# Patient Record
Sex: Female | Born: 1955
Health system: Southern US, Community
[De-identification: ages and names within clinical notes are randomized; demographics above are authoritative.]

## PROBLEM LIST (undated history)

## (undated) DIAGNOSIS — K219 Gastro-esophageal reflux disease without esophagitis: Secondary | ICD-10-CM

## (undated) DIAGNOSIS — Z973 Presence of spectacles and contact lenses: Secondary | ICD-10-CM

## (undated) DIAGNOSIS — Z9289 Personal history of other medical treatment: Secondary | ICD-10-CM

## (undated) DIAGNOSIS — N189 Chronic kidney disease, unspecified: Secondary | ICD-10-CM

## (undated) DIAGNOSIS — N186 End stage renal disease: Secondary | ICD-10-CM

## (undated) DIAGNOSIS — I2489 Other forms of acute ischemic heart disease: Secondary | ICD-10-CM

## (undated) DIAGNOSIS — I248 Other forms of acute ischemic heart disease: Secondary | ICD-10-CM

## (undated) DIAGNOSIS — M069 Rheumatoid arthritis, unspecified: Secondary | ICD-10-CM

## (undated) DIAGNOSIS — I712 Thoracic aortic aneurysm, without rupture: Secondary | ICD-10-CM

## (undated) DIAGNOSIS — J302 Other seasonal allergic rhinitis: Secondary | ICD-10-CM

## (undated) DIAGNOSIS — I10 Essential (primary) hypertension: Secondary | ICD-10-CM

## (undated) DIAGNOSIS — Z9889 Other specified postprocedural states: Secondary | ICD-10-CM

## (undated) DIAGNOSIS — M6281 Muscle weakness (generalized): Secondary | ICD-10-CM

## (undated) DIAGNOSIS — I471 Supraventricular tachycardia: Secondary | ICD-10-CM

## (undated) DIAGNOSIS — D649 Anemia, unspecified: Secondary | ICD-10-CM

## (undated) DIAGNOSIS — I4719 Other supraventricular tachycardia: Secondary | ICD-10-CM

## (undated) DIAGNOSIS — M8580 Other specified disorders of bone density and structure, unspecified site: Secondary | ICD-10-CM

## (undated) DIAGNOSIS — K5900 Constipation, unspecified: Secondary | ICD-10-CM

## (undated) DIAGNOSIS — R112 Nausea with vomiting, unspecified: Secondary | ICD-10-CM

## (undated) DIAGNOSIS — Z8489 Family history of other specified conditions: Secondary | ICD-10-CM

## (undated) HISTORY — DX: Anemia, unspecified: D64.9

## (undated) HISTORY — DX: Essential (primary) hypertension: I10

## (undated) HISTORY — DX: Muscle weakness (generalized): M62.81

## (undated) HISTORY — PX: TONSILLECTOMY: SUR1361

## (undated) HISTORY — PX: COLONOSCOPY: SHX174

---

## 1998-01-04 HISTORY — PX: TUMOR EXCISION: SHX421

## 2012-05-21 ENCOUNTER — Ambulatory Visit (INDEPENDENT_AMBULATORY_CARE_PROVIDER_SITE_OTHER): Payer: BC Managed Care – PPO | Admitting: Family Medicine

## 2012-05-21 ENCOUNTER — Encounter: Payer: Self-pay | Admitting: Family Medicine

## 2012-05-21 VITALS — BP 160/90 | HR 68 | Temp 97.9°F | Resp 16 | Ht 64.5 in | Wt 132.0 lb

## 2012-05-21 DIAGNOSIS — T148XXA Other injury of unspecified body region, initial encounter: Secondary | ICD-10-CM

## 2012-05-21 DIAGNOSIS — M79609 Pain in unspecified limb: Secondary | ICD-10-CM

## 2012-05-21 DIAGNOSIS — Z23 Encounter for immunization: Secondary | ICD-10-CM

## 2012-05-21 NOTE — Progress Notes (Signed)
Verbal consent obtained from the patient.  Local anesthesia with 2cc Lidocaine 2% without epinephrine.  Wound scrubbed with soap and water and rinsed.  Wound closed with #6 5-0 Prolene simple interrupted sutures.  Wound cleansed and dressed.

## 2012-05-21 NOTE — Patient Instructions (Addendum)

## 2012-05-23 NOTE — Progress Notes (Signed)
Subjective:    Patient ID: Erin Morgan, female    DOB: Jul 20, 1955, 57 y.o.   MRN: [id number] Chief Complaint  Patient presents with  . Arm Injury    right arm puncture x today    HPI  Attempted to move a small personal (college-dorm sized) fridge in her garage today and scraped her inner right forearm along the metal coils on the back of the fridge.  Immed started bleeding copiously and so applied pressure but just didn't look right - skin gaping and can see muscle underneath.  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) so knows that all immunizations are UTD but doesn't remember when last tetanus was - likely >5 yrs prev.  No past medical history on file. No current outpatient prescriptions on file prior to visit.   No current facility-administered medications on file prior to visit.   No Known Allergies   Review of Systems  Constitutional: Negative for fever, chills, diaphoresis and activity change.  Musculoskeletal: Positive for myalgias. Negative for joint swelling, arthralgias and gait problem.  Skin: Positive for wound. Negative for color change, pallor and rash.  Hematological: Negative for adenopathy. Does not bruise/bleed easily.  Psychiatric/Behavioral: The patient is not nervous/anxious.        BP 160/90  Pulse 68  Temp(Src) 97.9 F (36.6 C) (Oral)  Resp 16  Ht 5' 4.5" (1.638 m)  Wt 132 lb (59.875 kg)  BMI 22.32 kg/m2  SpO2 95% Objective:   Physical Exam  Constitutional: She is oriented to person, place, and time. She appears well-developed and well-nourished. No distress.  HENT:  Head: Normocephalic and atraumatic.  Right Ear: External ear normal.  Eyes: Conjunctivae are normal. No scleral icterus.  Pulmonary/Chest: Effort normal.  Musculoskeletal:       Right elbow: Normal.      Right forearm: She exhibits tenderness and laceration. She exhibits no bony tenderness, no swelling, no edema and no deformity.       Arms:      Right hand: She exhibits  normal range of motion, no tenderness, no bony tenderness, normal two-point discrimination, normal capillary refill and no swelling. Normal sensation noted. Normal strength noted. She exhibits no finger abduction, no thumb/finger opposition and no wrist extension trouble.  Neurological: She is alert and oriented to person, place, and time. She has normal strength. She displays no atrophy and no tremor. No sensory deficit. She exhibits normal muscle tone.  Skin: Skin is warm and dry. Abrasion and laceration noted. No bruising, no ecchymosis and no rash noted. She is not diaphoretic. No erythema.  Distal left inner forearm with 3 superficial linear abrasions running parallel to forearm - most medial with laceration and small avulsion of full thickness skin approx 2 cm length with 1/2 cm laceration perpendicular to middle of larger lac length into T-shape. No underlying muscle damage. Hemostatic  Psychiatric: She has a normal mood and affect. Her behavior is normal.      Assessment & Plan:  Puncture wound - Plan: Tdap vaccine greater than or equal to 7yo IM S/p suture repair by Dellis Filbert, PA. Wound care and f/u per her instructions.  RTC immed for any increased pain, redness, swelling, or purulent drainage.  Meds ordered this encounter  Medications  . VITAMIN D, CHOLECALCIFEROL, PO    Sig: Take by mouth.  Marland Kitchen alendronate (FOSAMAX) 35 MG tablet    Sig: Take 35 mg by mouth every 7 (seven) days. Take with a full glass of water on an  empty stomach.  . estradiol (VAGIFEM) 25 MCG vaginal tablet    Sig: Place 25 mcg vaginally daily.  . Biotin 10 MG CAPS    Sig: Take by mouth.

## 2012-05-31 ENCOUNTER — Ambulatory Visit (INDEPENDENT_AMBULATORY_CARE_PROVIDER_SITE_OTHER): Payer: BC Managed Care – PPO | Admitting: Physician Assistant

## 2012-05-31 VITALS — BP 118/80 | HR 86 | Temp 98.5°F | Resp 16

## 2012-05-31 DIAGNOSIS — Z4802 Encounter for removal of sutures: Secondary | ICD-10-CM

## 2012-05-31 NOTE — Progress Notes (Signed)
  Subjective:    Patient ID: Erin Morgan, female    DOB: 03/19/1955, 57 y.o.   MRN: [id number]  HPI   Erin Morgan is a 57 yr old female here for suture removal.  Sutures were placed here 10 days ago.  Pt states she is doing well.  Minimal discomfort at this point.  No redness, swelling, or drainage from the area.      Review of Systems  Skin: Positive for wound (healing, right forearm).  All other systems reviewed and are negative.       Objective:   Physical Exam  Vitals reviewed. Constitutional: She is oriented to person, place, and time. She appears well-developed and well-nourished. No distress.  HENT:  Head: Normocephalic and atraumatic.  Eyes: Conjunctivae are normal. No scleral icterus.  Pulmonary/Chest: Effort normal.  Neurological: She is alert and oriented to person, place, and time.  Skin: Skin is warm and dry.     Healed laceration of right forearm; sutures in place  Psychiatric: She has a normal mood and affect. Her behavior is normal.          Assessment & Plan:  Visit for suture removal   Erin Morgan is a pleasant 57 yr old female here for removal of sutures.  The wound is well healed and edges are well approximated.  #6 simple interrupted sutures removed without difficulty.  No need for further follow up unless new concerns arise.

## 2014-03-28 ENCOUNTER — Other Ambulatory Visit: Payer: Self-pay | Admitting: Orthopedic Surgery

## 2014-04-01 ENCOUNTER — Encounter (HOSPITAL_BASED_OUTPATIENT_CLINIC_OR_DEPARTMENT_OTHER): Payer: Self-pay | Admitting: *Deleted

## 2014-04-05 ENCOUNTER — Encounter (HOSPITAL_BASED_OUTPATIENT_CLINIC_OR_DEPARTMENT_OTHER)
Admission: RE | Disposition: A | Discharge status: Home / Self Care | Payer: Self-pay | Source: Ambulatory Visit | Attending: Orthopedic Surgery

## 2014-04-05 ENCOUNTER — Ambulatory Visit (HOSPITAL_BASED_OUTPATIENT_CLINIC_OR_DEPARTMENT_OTHER)
Admission: RE | Admit: 2014-04-05 | Discharge: 2014-04-05 | Disposition: A | Discharge status: Home / Self Care | Payer: BC Managed Care – PPO | Source: Ambulatory Visit | Attending: Orthopedic Surgery | Admitting: Orthopedic Surgery

## 2014-04-05 ENCOUNTER — Ambulatory Visit (HOSPITAL_BASED_OUTPATIENT_CLINIC_OR_DEPARTMENT_OTHER): Payer: BC Managed Care – PPO | Admitting: Certified Registered"

## 2014-04-05 ENCOUNTER — Encounter (HOSPITAL_BASED_OUTPATIENT_CLINIC_OR_DEPARTMENT_OTHER): Payer: Self-pay

## 2014-04-05 DIAGNOSIS — G5601 Carpal tunnel syndrome, right upper limb: Secondary | ICD-10-CM | POA: Insufficient documentation

## 2014-04-05 DIAGNOSIS — M8580 Other specified disorders of bone density and structure, unspecified site: Secondary | ICD-10-CM | POA: Diagnosis not present

## 2014-04-05 HISTORY — DX: Other specified postprocedural states: Z98.890

## 2014-04-05 HISTORY — DX: Presence of spectacles and contact lenses: Z97.3

## 2014-04-05 HISTORY — DX: Other specified disorders of bone density and structure, unspecified site: M85.80

## 2014-04-05 HISTORY — PX: CARPAL TUNNEL RELEASE: SHX101

## 2014-04-05 HISTORY — DX: Other seasonal allergic rhinitis: J30.2

## 2014-04-05 HISTORY — DX: Nausea with vomiting, unspecified: R11.2

## 2014-04-05 LAB — POCT HEMOGLOBIN-HEMACUE: HEMOGLOBIN: 13.8 g/dL (ref 12.0–15.0)

## 2014-04-05 SURGERY — CARPAL TUNNEL RELEASE
Anesthesia: Monitor Anesthesia Care | Site: Hand | Laterality: Right

## 2014-04-05 MED ORDER — CEFAZOLIN SODIUM-DEXTROSE 2-3 GM-% IV SOLR
INTRAVENOUS | Status: AC
  Filled 2014-04-05: qty 100

## 2014-04-05 MED ORDER — BUPIVACAINE HCL (PF) 0.25 % IJ SOLN
INTRAMUSCULAR | Status: DC | PRN
  Administered 2014-04-05: 8 mL

## 2014-04-05 MED ORDER — ONDANSETRON HCL 4 MG/2ML IJ SOLN: 4.0000 mg | Freq: Once | INTRAMUSCULAR | Status: DC | PRN

## 2014-04-05 MED ORDER — HYDROCODONE-ACETAMINOPHEN 5-325 MG PO TABS: 1.0000 | ORAL_TABLET | Freq: Four times a day (QID) | ORAL | Status: DC | PRN

## 2014-04-05 MED ORDER — MIDAZOLAM HCL 2 MG/2ML IJ SOLN: 1.0000 mg | INTRAMUSCULAR | Status: DC | PRN

## 2014-04-05 MED ORDER — LIDOCAINE HCL (PF) 0.5 % IJ SOLN
INTRAMUSCULAR | Status: DC | PRN
  Administered 2014-04-05: 30 mL via INTRAVENOUS

## 2014-04-05 MED ORDER — PROPOFOL 10 MG/ML IV BOLUS
INTRAVENOUS | Status: DC | PRN
  Administered 2014-04-05 (×2): 20 mg via INTRAVENOUS

## 2014-04-05 MED ORDER — MIDAZOLAM HCL 5 MG/5ML IJ SOLN
INTRAMUSCULAR | Status: DC | PRN
  Administered 2014-04-05: 2 mg via INTRAVENOUS

## 2014-04-05 MED ORDER — LACTATED RINGERS IV SOLN
INTRAVENOUS | Status: DC
  Administered 2014-04-05: 10 mL/h via INTRAVENOUS
  Administered 2014-04-05: 12:00:00 via INTRAVENOUS

## 2014-04-05 MED ORDER — FENTANYL CITRATE 0.05 MG/ML IJ SOLN
INTRAMUSCULAR | Status: AC
  Filled 2014-04-05: qty 2

## 2014-04-05 MED ORDER — OXYCODONE HCL 5 MG PO TABS: 5.0000 mg | ORAL_TABLET | Freq: Once | ORAL | Status: DC | PRN

## 2014-04-05 MED ORDER — CEFAZOLIN SODIUM-DEXTROSE 2-3 GM-% IV SOLR: 2.0000 g | INTRAVENOUS | Status: DC

## 2014-04-05 MED ORDER — CHLORHEXIDINE GLUCONATE 4 % EX LIQD: 60.0000 mL | Freq: Once | CUTANEOUS | Status: DC

## 2014-04-05 MED ORDER — LIDOCAINE HCL (CARDIAC) 20 MG/ML IV SOLN
INTRAVENOUS | Status: DC | PRN
  Administered 2014-04-05: 25 mg via INTRAVENOUS

## 2014-04-05 MED ORDER — FENTANYL CITRATE 0.05 MG/ML IJ SOLN
INTRAMUSCULAR | Status: AC
  Filled 2014-04-05: qty 4

## 2014-04-05 MED ORDER — CEFAZOLIN SODIUM-DEXTROSE 2-3 GM-% IV SOLR
2.0000 g | INTRAVENOUS | Status: AC
  Administered 2014-04-05: 2 g via INTRAVENOUS

## 2014-04-05 MED ORDER — FENTANYL CITRATE 0.05 MG/ML IJ SOLN
INTRAMUSCULAR | Status: DC | PRN
Start: 2014-04-05 — End: 2014-04-05
  Administered 2014-04-05: 100 ug via INTRAVENOUS

## 2014-04-05 MED ORDER — FENTANYL CITRATE 0.05 MG/ML IJ SOLN
25.0000 ug | INTRAMUSCULAR | Status: DC | PRN
  Administered 2014-04-05 (×2): 25 ug via INTRAVENOUS

## 2014-04-05 MED ORDER — KETOROLAC TROMETHAMINE 30 MG/ML IJ SOLN: 30.0000 mg | Freq: Once | INTRAMUSCULAR | Status: DC | PRN

## 2014-04-05 MED ORDER — OXYCODONE HCL 5 MG/5ML PO SOLN: 5.0000 mg | Freq: Once | ORAL | Status: DC | PRN

## 2014-04-05 MED ORDER — FENTANYL CITRATE 0.05 MG/ML IJ SOLN: 50.0000 ug | INTRAMUSCULAR | Status: DC | PRN

## 2014-04-05 MED ORDER — BUPIVACAINE HCL (PF) 0.25 % IJ SOLN
INTRAMUSCULAR | Status: AC
  Filled 2014-04-05: qty 30

## 2014-04-05 MED ORDER — MIDAZOLAM HCL 2 MG/2ML IJ SOLN
INTRAMUSCULAR | Status: AC
  Filled 2014-04-05: qty 2

## 2014-04-05 SURGICAL SUPPLY — 34 items
BLADE SURG 15 STRL LF DISP TIS (BLADE) ×1 IMPLANT
BLADE SURG 15 STRL SS (BLADE) ×2
BNDG COHESIVE 3X5 TAN STRL LF (GAUZE/BANDAGES/DRESSINGS) ×3 IMPLANT
BNDG ESMARK 4X9 LF (GAUZE/BANDAGES/DRESSINGS) IMPLANT
BNDG GAUZE ELAST 4 BULKY (GAUZE/BANDAGES/DRESSINGS) ×3 IMPLANT
CHLORAPREP W/TINT 26ML (MISCELLANEOUS) ×3 IMPLANT
CORDS BIPOLAR (ELECTRODE) ×3 IMPLANT
COVER BACK TABLE 60X90IN (DRAPES) ×3 IMPLANT
COVER MAYO STAND STRL (DRAPES) ×3 IMPLANT
CUFF TOURNIQUET SINGLE 18IN (TOURNIQUET CUFF) ×3 IMPLANT
DRAPE EXTREMITY T 121X128X90 (DRAPE) ×3 IMPLANT
DRAPE SURG 17X23 STRL (DRAPES) ×3 IMPLANT
DRSG PAD ABDOMINAL 8X10 ST (GAUZE/BANDAGES/DRESSINGS) ×3 IMPLANT
GAUZE SPONGE 4X4 12PLY STRL (GAUZE/BANDAGES/DRESSINGS) ×3 IMPLANT
GAUZE XEROFORM 1X8 LF (GAUZE/BANDAGES/DRESSINGS) ×3 IMPLANT
GLOVE BIOGEL PI IND STRL 7.5 (GLOVE) ×1 IMPLANT
GLOVE BIOGEL PI IND STRL 8.5 (GLOVE) ×1 IMPLANT
GLOVE BIOGEL PI INDICATOR 7.5 (GLOVE) ×2
GLOVE BIOGEL PI INDICATOR 8.5 (GLOVE) ×2
GLOVE SURG ORTHO 8.0 STRL STRW (GLOVE) ×3 IMPLANT
GLOVE SURG SS PI 7.0 STRL IVOR (GLOVE) ×3 IMPLANT
GOWN STRL REUS W/ TWL LRG LVL3 (GOWN DISPOSABLE) ×1 IMPLANT
GOWN STRL REUS W/TWL LRG LVL3 (GOWN DISPOSABLE) ×2
GOWN STRL REUS W/TWL XL LVL3 (GOWN DISPOSABLE) ×3 IMPLANT
NEEDLE PRECISIONGLIDE 27X1.5 (NEEDLE) IMPLANT
NS IRRIG 1000ML POUR BTL (IV SOLUTION) ×3 IMPLANT
PACK BASIN DAY SURGERY FS (CUSTOM PROCEDURE TRAY) ×3 IMPLANT
STOCKINETTE 4X48 STRL (DRAPES) ×3 IMPLANT
SUT ETHILON 4 0 PS 2 18 (SUTURE) ×3 IMPLANT
SUT VICRYL 4-0 PS2 18IN ABS (SUTURE) IMPLANT
SYR BULB 3OZ (MISCELLANEOUS) ×3 IMPLANT
SYR CONTROL 10ML LL (SYRINGE) IMPLANT
TOWEL OR 17X24 6PK STRL BLUE (TOWEL DISPOSABLE) ×3 IMPLANT
UNDERPAD 30X30 INCONTINENT (UNDERPADS AND DIAPERS) ×3 IMPLANT

## 2014-04-05 NOTE — Op Note (Signed)
Dictation Number [id number]

## 2014-04-05 NOTE — Anesthesia Preprocedure Evaluation (Signed)
Anesthesia Evaluation  Patient identified by MRN, date of birth, ID band Patient awake    Reviewed: Allergy & Precautions, NPO status , Patient's Chart, lab work & pertinent test results  Airway Mallampati: II  TM Distance: >3 FB Neck ROM: Full    Dental  (+) Teeth Intact, Dental Advisory Given   Pulmonary  breath sounds clear to auscultation        Cardiovascular Rhythm:Regular Rate:Normal     Neuro/Psych    GI/Hepatic   Endo/Other    Renal/GU      Musculoskeletal   Abdominal   Peds  Hematology   Anesthesia Other Findings   Reproductive/Obstetrics                             Anesthesia Physical Anesthesia Plan  ASA: II  Anesthesia Plan: MAC and Regional   Post-op Pain Management:    Induction: Intravenous  Airway Management Planned: Natural Airway and Simple Face Mask  Additional Equipment:   Intra-op Plan:   Post-operative Plan:   Informed Consent: I have reviewed the patients History and Physical, chart, labs and discussed the procedure including the risks, benefits and alternatives for the proposed anesthesia with the patient or authorized representative who has indicated his/her understanding and acceptance.   Dental advisory given  Plan Discussed with: CRNA and Anesthesiologist  Anesthesia Plan Comments:         Anesthesia Quick Evaluation

## 2014-04-05 NOTE — Transfer of Care (Signed)
Immediate Anesthesia Transfer of Care Note  Patient: Erin Morgan  Procedure(s) Performed: Procedure(s) with comments: RIGHT CARPAL TUNNEL RELEASE (Right) - ANESTHESIA:  IV REGIONAL FAB  Patient Location: PACU  Anesthesia Type:MAC and Bier block  Level of Consciousness: awake, alert  and oriented  Airway & Oxygen Therapy: Patient Spontanous Breathing and Patient connected to face mask oxygen  Post-op Assessment: Report given to RN and Post -op Vital signs reviewed and stable  Post vital signs: Reviewed and stable  Last Vitals:  Filed Vitals:   04/05/14 1200  BP: 132/74  Pulse: 66  Temp: 36.6 C  Resp: 20    Complications: No apparent anesthesia complications

## 2014-04-05 NOTE — Discharge Instructions (Addendum)

## 2014-04-05 NOTE — H&P (Signed)
Erin Morgan is a 59 year old right hand dominant female referred by Dr. Suzy Bouchard for a consultation with respect to numbness and tingling bilateral hands, right equal to left. This has been going on for approximately 4 months on a regular basis. She states she has had problems for years intermittently. She has had nerve conductions done at [facility] on 01-31-14 by Dr. Baltazar Najjar revealing bilateral carpal tunnel syndrome with motor delay of 5.2 on the left, 4.4 on the right, sensory delay of 3.5 on the left and 4.2 on the right. She has had a Medrol Dosepak which gave her temporary relief. She has no history of injury to the hand. She has had a questionable history of injury to the neck doing a hand stand. She has been on ibuprofen with minimal relief taking only 800 mg in 1 day. She has no history of diabetes or thyroid problems. She does have a history of arthritis. There is no history of gout. She is awakened 7 out of 7 nights. 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 She has worn splints at night which have not given her relief. She states that she has relatively constant numbness and tingling . She has had a mediocre relief following the injection to her carpal tunnel syndrome.  She has elected not to have the opposite side done. She is wondering about proceeding to have a release done to the right side.     PAST MEDICAL HISTORY:  She has no drug allergies. She is on Alendronate, Vagifem, calcium and vitamin D. She has a fibroma removed from her foot and tonsillectomy.   FAMILY MEDICAL HISTORY: Positive for heart disease, high BP, and arthritis.  SOCIAL HISTORY:  She does not smoke. She drinks socially. She is widowed and an Scientist, physiological at [employer].  REVIEW OF SYSTEMS: Positive for glasses, otherwise negative 14 points.  Erin Morgan is an 59 y.o. female.   Chief Complaint: Cts right HPI: see above  Past Medical History  Diagnosis Date  . Wears glasses    . Osteopenia   . Seasonal allergies   . PONV (postoperative nausea and vomiting)     Past Surgical History  Procedure Laterality Date  . Tonsillectomy    . Colonoscopy    . Tumor excision  2000    right foot    History reviewed. No pertinent family history. Social History:  reports that she has never smoked. She does not have any smokeless tobacco history on file. She reports that she drinks alcohol. She reports that she does not use illicit drugs.  Allergies: No Known Allergies  Medications Prior to Admission  Medication Sig Dispense Refill  . alendronate (FOSAMAX) 35 MG tablet Take 35 mg by mouth every 7 (seven) days. Take with a full glass of water on an empty stomach.    . Biotin 10 MG CAPS Take by mouth.    . estradiol (VAGIFEM) 25 MCG vaginal tablet Place 25 mcg vaginally daily.    . hydrochlorothiazide (MICROZIDE) 12.5 MG capsule Take 12.5 mg by mouth as needed.    Marland Kitchen VITAMIN D, CHOLECALCIFEROL, PO Take by mouth.      No results found for this or any previous visit (from the past 48 hour(s)).  No results found.   Pertinent items are noted in HPI.  Blood pressure 132/74, pulse 66, temperature 97.9 F (36.6 C), temperature source Oral, resp. rate 20, height 5' 4.5" (1.638 m), weight 62.143 kg (137 lb), SpO2 100 %.  General appearance: alert, cooperative and appears stated age Head: Normocephalic, without obvious abnormality Neck: no JVD Resp: clear to auscultation bilaterally Cardio: regular rate and rhythm, S1, S2 normal, no murmur, click, rub or gallop GI: soft, non-tender; bowel sounds normal; no masses,  no organomegaly Extremities: numbness right hand Pulses: 2+ and symmetric Skin: Skin color, texture, turgor normal. No rashes or lesions Neurologic: Grossly normal Incision/Wound: na  Assessment/Plan X-rays reveal STT arthritis bilaterally.  DIAGNOSIS: (1) Asymptomatic STT arthritis. (2) Asymptomatic non-constricting Dupuytren's contracture. (3)  Bilateral carpal tunnel syndrome. DX:CTS Right Plan: Carpal tunnel release to the right side.   The pre, peri and postoperative course were discussed along with the risks and complications.  The patient is aware there is no guarantee with the surgery, possibility of infection, recurrence, injury to arteries, nerves, tendons, incomplete relief of symptoms and dystrophy.  She would like to proceed.  She is scheduled for right carpal tunnel release, outpatient, under regional anesthesia.  Bluford Sedler R 04/05/2014, 12:12 PM

## 2014-04-05 NOTE — Brief Op Note (Signed)
04/05/2014  2:06 PM  PATIENT:  Erin Morgan  59 y.o. female  PRE-OPERATIVE DIAGNOSIS:  RIGHT CARPAL TUNNEL SYNDROME  POST-OPERATIVE DIAGNOSIS:  RIGHT CARPAL TUNNEL SYNDROME  PROCEDURE:  Procedure(s) with comments: RIGHT CARPAL TUNNEL RELEASE (Right) - ANESTHESIA:  IV REGIONAL FAB  SURGEON:  Surgeon(s) and Role:    * Daryll Brod, MD - Primary  PHYSICIAN ASSISTANT:   ASSISTANTS: none   ANESTHESIA:   local and regional  EBL:  Total I/O In: 900 [I.V.:900] Out: -   BLOOD ADMINISTERED:none  DRAINS: none   LOCAL MEDICATIONS USED:  BUPIVICAINE   SPECIMEN:  No Specimen  DISPOSITION OF SPECIMEN:  N/A  COUNTS:  YES  TOURNIQUET:   Total Tourniquet Time Documented: Forearm (Right) - 16 minutes Total: Forearm (Right) - 16 minutes   DICTATION: .Other Dictation: Dictation Number [id number]  PLAN OF CARE: Discharge to home after PACU  PATIENT DISPOSITION:  PACU - hemodynamically stable.

## 2014-04-05 NOTE — Anesthesia Postprocedure Evaluation (Signed)
Anesthesia Post Note  Patient: Erin Morgan  Procedure(s) Performed: Procedure(s) (LRB): RIGHT CARPAL TUNNEL RELEASE (Right)  Anesthesia type: MAC and block  Patient location: PACU  Post pain: Pain level controlled and Adequate analgesia  Post assessment: Post-op Vital signs reviewed, Patient's Cardiovascular Status Stable and Respiratory Function Stable  Last Vitals:  Filed Vitals:   04/05/14 1535  BP: 152/76  Pulse: 92  Temp: 36.9 C  Resp: 16    Post vital signs: Reviewed and stable  Level of consciousness: awake, alert  and oriented  Complications: No apparent anesthesia complications

## 2014-04-08 NOTE — Op Note (Signed)
NAMEJASMIA, Erin Morgan              ACCOUNT NO.:  [id number]  MEDICAL RECORD NO.:  [id number]  LOCATION:                                FACILITY:  [facility]  PHYSICIAN:  Daryll Brod, M.D.       DATE OF BIRTH:  [birth year]  DATE OF PROCEDURE:  04/05/2014 DATE OF DISCHARGE:  04/05/2014                              OPERATIVE REPORT   PREOPERATIVE DIAGNOSIS:  Carpal tunnel syndrome, right hand.  POSTOPERATIVE DIAGNOSIS:  Carpal tunnel syndrome, right hand.  OPERATION:  Decompression right median nerve.  SURGEON:  Daryll Brod, MD.  ANESTHESIA:  Forearm-based IV regional with local infiltration.  ANESTHESIOLOGISTS:  [place].  HISTORY:  The patient is a 59 year old female with a history of carpal tunnel syndrome, nerve conduction is positive bilaterally.  She has elected to undergo surgical release of the right side.  She has had injected to the left side, but declined injection to the right.  She is aware that there is no guarantee with surgery, possibility of infection; recurrence of injury to arteries, nerves, tendons, incomplete release of symptoms and dystrophy.  In preoperative area, the patient is seen, the extremity marked by both patient and surgeon.  Antibiotic given.  PROCEDURE IN DETAIL:  The patient was brought to the operating room, where forearm-based IV regional anesthetic was carried out without difficulty.  She was prepped using ChloraPrep, supine position, right arm free.  A 3-minute dry time was allowed.  Time-out taken, confirming the patient and procedure.  The adequate anesthesia was afforded.  A longitudinal incision was made in the right palm, carried down through subcutaneous tissue.  Bleeders were electrocauterized.  Palmar fascia was split.  Superficial palmar arch identified.  Flexor tendon to the ring and little finger identified to the ulnar side of the median nerve. The carpal retinaculum was incised with sharp dissection.  Right angle and Sewall retractor  were placed between skin and forearm fascia.  The fascia was released for approximately 2 cm proximal to the wrist crease under direct vision.  Canal was explored.  Air compression to the nerve was apparent.  Motor branch entered into muscle.  No further lesions were identified.  The wound was irrigated and the skin closed with interrupted 4-0 nylon sutures.  Local infiltration with 0.25% bupivacaine without epinephrine was given, approximately 7 mL was used. Sterile compressive dressing with the fingers free was applied.  On deflation of the tourniquet, all fingers immediately pinked.  She was taken to the recovery room for observation in satisfactory condition.  She will be discharged home to return to the [facility] in 1 week on Norco.          ______________________________ Daryll Brod, M.D.     GK/MEDQ  D:  04/05/2014  T:  04/06/2014  Job:  [id number]

## 2014-04-09 ENCOUNTER — Encounter (HOSPITAL_BASED_OUTPATIENT_CLINIC_OR_DEPARTMENT_OTHER): Payer: Self-pay | Admitting: Orthopedic Surgery

## 2014-04-09 NOTE — Addendum Note (Signed)
Addendum  created 04/09/14 0735 by Tawni Millers, CRNA   Modules edited: Charges VN

## 2014-07-09 ENCOUNTER — Ambulatory Visit
Admission: RE | Admit: 2014-07-09 | Discharge: 2014-07-09 | Disposition: A | Discharge status: Home / Self Care | Payer: BC Managed Care – PPO | Source: Ambulatory Visit | Attending: Family Medicine | Admitting: Family Medicine

## 2014-07-09 ENCOUNTER — Other Ambulatory Visit: Payer: Self-pay | Admitting: Family Medicine

## 2014-07-09 DIAGNOSIS — R52 Pain, unspecified: Secondary | ICD-10-CM

## 2014-07-17 ENCOUNTER — Emergency Department (HOSPITAL_COMMUNITY): Payer: BC Managed Care – PPO

## 2014-07-17 ENCOUNTER — Inpatient Hospital Stay (HOSPITAL_COMMUNITY)
Admission: EM | Admit: 2014-07-17 | Discharge: 2014-08-09 | DRG: 682 | Disposition: A | Discharge status: Home / Self Care | Payer: BC Managed Care – PPO | Attending: Family Medicine | Admitting: Family Medicine

## 2014-07-17 ENCOUNTER — Encounter (HOSPITAL_COMMUNITY): Payer: Self-pay | Admitting: Family Medicine

## 2014-07-17 DIAGNOSIS — R809 Proteinuria, unspecified: Secondary | ICD-10-CM | POA: Diagnosis present

## 2014-07-17 DIAGNOSIS — E876 Hypokalemia: Secondary | ICD-10-CM | POA: Diagnosis present

## 2014-07-17 DIAGNOSIS — Z8049 Family history of malignant neoplasm of other genital organs: Secondary | ICD-10-CM | POA: Diagnosis not present

## 2014-07-17 DIAGNOSIS — E440 Moderate protein-calorie malnutrition: Secondary | ICD-10-CM | POA: Diagnosis present

## 2014-07-17 DIAGNOSIS — Z992 Dependence on renal dialysis: Secondary | ICD-10-CM | POA: Diagnosis present

## 2014-07-17 DIAGNOSIS — D5939 Other hemolytic-uremic syndrome: Secondary | ICD-10-CM

## 2014-07-17 DIAGNOSIS — K5900 Constipation, unspecified: Secondary | ICD-10-CM | POA: Diagnosis present

## 2014-07-17 DIAGNOSIS — R7989 Other specified abnormal findings of blood chemistry: Secondary | ICD-10-CM | POA: Diagnosis not present

## 2014-07-17 DIAGNOSIS — J90 Pleural effusion, not elsewhere classified: Secondary | ICD-10-CM | POA: Diagnosis not present

## 2014-07-17 DIAGNOSIS — G43909 Migraine, unspecified, not intractable, without status migrainosus: Secondary | ICD-10-CM | POA: Diagnosis present

## 2014-07-17 DIAGNOSIS — E8770 Fluid overload, unspecified: Secondary | ICD-10-CM | POA: Diagnosis present

## 2014-07-17 DIAGNOSIS — Y95 Nosocomial condition: Secondary | ICD-10-CM | POA: Diagnosis not present

## 2014-07-17 DIAGNOSIS — I160 Hypertensive urgency: Secondary | ICD-10-CM | POA: Diagnosis present

## 2014-07-17 DIAGNOSIS — R52 Pain, unspecified: Secondary | ICD-10-CM

## 2014-07-17 DIAGNOSIS — D509 Iron deficiency anemia, unspecified: Secondary | ICD-10-CM | POA: Diagnosis present

## 2014-07-17 DIAGNOSIS — R51 Headache: Secondary | ICD-10-CM | POA: Diagnosis not present

## 2014-07-17 DIAGNOSIS — D72829 Elevated white blood cell count, unspecified: Secondary | ICD-10-CM | POA: Diagnosis not present

## 2014-07-17 DIAGNOSIS — E872 Acidosis: Secondary | ICD-10-CM | POA: Diagnosis not present

## 2014-07-17 DIAGNOSIS — M069 Rheumatoid arthritis, unspecified: Secondary | ICD-10-CM | POA: Diagnosis present

## 2014-07-17 DIAGNOSIS — D593 Hemolytic-uremic syndrome: Secondary | ICD-10-CM | POA: Diagnosis present

## 2014-07-17 DIAGNOSIS — I120 Hypertensive chronic kidney disease with stage 5 chronic kidney disease or end stage renal disease: Secondary | ICD-10-CM | POA: Diagnosis present

## 2014-07-17 DIAGNOSIS — N289 Disorder of kidney and ureter, unspecified: Secondary | ICD-10-CM | POA: Diagnosis present

## 2014-07-17 DIAGNOSIS — G9340 Encephalopathy, unspecified: Secondary | ICD-10-CM | POA: Diagnosis not present

## 2014-07-17 DIAGNOSIS — J189 Pneumonia, unspecified organism: Secondary | ICD-10-CM | POA: Diagnosis not present

## 2014-07-17 DIAGNOSIS — N186 End stage renal disease: Secondary | ICD-10-CM | POA: Diagnosis present

## 2014-07-17 DIAGNOSIS — R312 Other microscopic hematuria: Secondary | ICD-10-CM | POA: Diagnosis not present

## 2014-07-17 DIAGNOSIS — Z6821 Body mass index (BMI) 21.0-21.9, adult: Secondary | ICD-10-CM

## 2014-07-17 DIAGNOSIS — M311 Thrombotic microangiopathy: Secondary | ICD-10-CM | POA: Diagnosis present

## 2014-07-17 DIAGNOSIS — I10 Essential (primary) hypertension: Secondary | ICD-10-CM | POA: Diagnosis not present

## 2014-07-17 DIAGNOSIS — Z23 Encounter for immunization: Secondary | ICD-10-CM

## 2014-07-17 DIAGNOSIS — I701 Atherosclerosis of renal artery: Secondary | ICD-10-CM

## 2014-07-17 DIAGNOSIS — R519 Headache, unspecified: Secondary | ICD-10-CM

## 2014-07-17 DIAGNOSIS — R0602 Shortness of breath: Secondary | ICD-10-CM

## 2014-07-17 DIAGNOSIS — Z7952 Long term (current) use of systemic steroids: Secondary | ICD-10-CM | POA: Diagnosis not present

## 2014-07-17 DIAGNOSIS — R319 Hematuria, unspecified: Secondary | ICD-10-CM | POA: Diagnosis present

## 2014-07-17 DIAGNOSIS — N179 Acute kidney failure, unspecified: Secondary | ICD-10-CM | POA: Diagnosis present

## 2014-07-17 DIAGNOSIS — N19 Unspecified kidney failure: Secondary | ICD-10-CM | POA: Diagnosis not present

## 2014-07-17 DIAGNOSIS — R778 Other specified abnormalities of plasma proteins: Secondary | ICD-10-CM | POA: Diagnosis present

## 2014-07-17 DIAGNOSIS — Z8249 Family history of ischemic heart disease and other diseases of the circulatory system: Secondary | ICD-10-CM | POA: Diagnosis not present

## 2014-07-17 DIAGNOSIS — E871 Hypo-osmolality and hyponatremia: Secondary | ICD-10-CM | POA: Diagnosis present

## 2014-07-17 DIAGNOSIS — R1110 Vomiting, unspecified: Secondary | ICD-10-CM

## 2014-07-17 DIAGNOSIS — Z823 Family history of stroke: Secondary | ICD-10-CM

## 2014-07-17 LAB — URINE MICROSCOPIC-ADD ON

## 2014-07-17 LAB — COMPREHENSIVE METABOLIC PANEL
ALT: 19 U/L (ref 14–54)
AST: 33 U/L (ref 15–41)
Albumin: 3.9 g/dL (ref 3.5–5.0)
Alkaline Phosphatase: 51 U/L (ref 38–126)
Anion gap: 14 (ref 5–15)
BILIRUBIN TOTAL: 1.1 mg/dL (ref 0.3–1.2)
BUN: 53 mg/dL — ABNORMAL HIGH (ref 6–20)
CO2: 25 mmol/L (ref 22–32)
Calcium: 8.9 mg/dL (ref 8.9–10.3)
Chloride: 90 mmol/L — ABNORMAL LOW (ref 101–111)
Creatinine, Ser: 3.78 mg/dL — ABNORMAL HIGH (ref 0.44–1.00)
GFR calc Af Amer: 14 mL/min — ABNORMAL LOW (ref >60)
GFR calc non Af Amer: 12 mL/min — ABNORMAL LOW (ref >60)
GLUCOSE: 125 mg/dL — AB (ref 65–99)
POTASSIUM: 3.4 mmol/L — AB (ref 3.5–5.1)
Sodium: 129 mmol/L — ABNORMAL LOW (ref 135–145)
Total Protein: 6.1 g/dL — ABNORMAL LOW (ref 6.5–8.1)

## 2014-07-17 LAB — CBC WITH DIFFERENTIAL/PLATELET
BASOS ABS: 0 10*3/uL (ref 0.0–0.1)
Basophils Relative: 0 % (ref 0–1)
EOS PCT: 0 % (ref 0–5)
Eosinophils Absolute: 0 10*3/uL (ref 0.0–0.7)
HCT: 28.4 % — ABNORMAL LOW (ref 36.0–46.0)
HEMOGLOBIN: 9.4 g/dL — AB (ref 12.0–15.0)
Lymphocytes Relative: 6 % — ABNORMAL LOW (ref 12–46)
Lymphs Abs: 0.6 10*3/uL — ABNORMAL LOW (ref 0.7–4.0)
MCH: 30.1 pg (ref 26.0–34.0)
MCHC: 33.1 g/dL (ref 30.0–36.0)
MCV: 91 fL (ref 78.0–100.0)
MONO ABS: 0.8 10*3/uL (ref 0.1–1.0)
MONOS PCT: 8 % (ref 3–12)
NEUTROS PCT: 86 % — AB (ref 43–77)
Neutro Abs: 9 10*3/uL — ABNORMAL HIGH (ref 1.7–7.7)
Platelets: 175 10*3/uL (ref 150–400)
RBC: 3.12 MIL/uL — ABNORMAL LOW (ref 3.87–5.11)
RDW: 18 % — AB (ref 11.5–15.5)
WBC: 10.4 10*3/uL (ref 4.0–10.5)

## 2014-07-17 LAB — IRON AND TIBC
Iron: 42 ug/dL (ref 28–170)
SATURATION RATIOS: 14 % (ref 10.4–31.8)
TIBC: 290 ug/dL (ref 250–450)
UIBC: 248 ug/dL

## 2014-07-17 LAB — URINALYSIS, ROUTINE W REFLEX MICROSCOPIC
Bilirubin Urine: NEGATIVE
Glucose, UA: NEGATIVE mg/dL
Ketones, ur: NEGATIVE mg/dL
Leukocytes, UA: NEGATIVE
Nitrite: NEGATIVE
Protein, ur: 100 mg/dL — AB
Specific Gravity, Urine: 1.01 (ref 1.005–1.030)
Urobilinogen, UA: 0.2 mg/dL (ref 0.0–1.0)
pH: 6.5 (ref 5.0–8.0)

## 2014-07-17 LAB — RETICULOCYTES
RBC.: 3.02 MIL/uL — AB (ref 3.87–5.11)
RETIC CT PCT: 6 % — AB (ref 0.4–3.1)
Retic Count, Absolute: 181.2 10*3/uL (ref 19.0–186.0)

## 2014-07-17 LAB — VITAMIN B12: VITAMIN B 12: 483 pg/mL (ref 180–914)

## 2014-07-17 LAB — SODIUM, URINE, RANDOM: Sodium, Ur: 41 mmol/L

## 2014-07-17 LAB — CREATININE, URINE, RANDOM: Creatinine, Urine: 53.28 mg/dL

## 2014-07-17 LAB — FERRITIN: FERRITIN: 61 ng/mL (ref 11–307)

## 2014-07-17 MED ORDER — HYDROMORPHONE HCL 1 MG/ML IJ SOLN
1.0000 mg | Freq: Once | INTRAMUSCULAR | Status: AC
  Administered 2014-07-17: 1 mg via INTRAVENOUS
  Filled 2014-07-17: qty 1

## 2014-07-17 MED ORDER — HYDROCODONE-ACETAMINOPHEN 5-325 MG PO TABS
1.0000 | ORAL_TABLET | ORAL | Status: DC | PRN
  Administered 2014-07-18 – 2014-07-22 (×4): 1 via ORAL
  Administered 2014-07-22 – 2014-08-01 (×5): 2 via ORAL
  Filled 2014-07-17: qty 1
  Filled 2014-07-17 (×7): qty 2
  Filled 2014-07-17 (×2): qty 1

## 2014-07-17 MED ORDER — PREDNISONE 20 MG PO TABS
20.0000 mg | ORAL_TABLET | Freq: Every day | ORAL | Status: DC
  Administered 2014-07-18 – 2014-08-09 (×24): 20 mg via ORAL
  Filled 2014-07-17 (×28): qty 1

## 2014-07-17 MED ORDER — ONDANSETRON HCL 4 MG/2ML IJ SOLN
INTRAMUSCULAR | Status: AC
Start: 2014-07-17 — End: 2014-07-17
  Filled 2014-07-17: qty 2

## 2014-07-17 MED ORDER — MORPHINE SULFATE 2 MG/ML IJ SOLN
INTRAMUSCULAR | Status: AC
  Filled 2014-07-17: qty 1

## 2014-07-17 MED ORDER — MORPHINE SULFATE 2 MG/ML IJ SOLN
2.0000 mg | INTRAMUSCULAR | Status: DC | PRN
  Administered 2014-07-17 – 2014-07-22 (×10): 2 mg via INTRAVENOUS
  Filled 2014-07-17 (×10): qty 1

## 2014-07-17 MED ORDER — ACETAMINOPHEN 325 MG PO TABS
650.0000 mg | ORAL_TABLET | Freq: Four times a day (QID) | ORAL | Status: DC | PRN
  Administered 2014-07-18 – 2014-07-19 (×2): 650 mg via ORAL
  Filled 2014-07-17 (×2): qty 2

## 2014-07-17 MED ORDER — LORAZEPAM 2 MG/ML IJ SOLN
0.5000 mg | Freq: Once | INTRAMUSCULAR | Status: AC
  Administered 2014-07-17: 0.5 mg via INTRAVENOUS
  Filled 2014-07-17: qty 1

## 2014-07-17 MED ORDER — ONDANSETRON HCL 4 MG/2ML IJ SOLN
4.0000 mg | Freq: Once | INTRAMUSCULAR | Status: AC
  Administered 2014-07-17: 4 mg via INTRAVENOUS
  Filled 2014-07-17: qty 2

## 2014-07-17 MED ORDER — HYDRALAZINE HCL 20 MG/ML IJ SOLN
10.0000 mg | Freq: Once | INTRAMUSCULAR | Status: AC
  Administered 2014-07-17: 10 mg via INTRAVENOUS
  Filled 2014-07-17: qty 1

## 2014-07-17 MED ORDER — ONDANSETRON HCL 4 MG PO TABS
4.0000 mg | ORAL_TABLET | Freq: Four times a day (QID) | ORAL | Status: DC | PRN
  Administered 2014-07-21 – 2014-07-22 (×2): 4 mg via ORAL
  Filled 2014-07-17 (×2): qty 1

## 2014-07-17 MED ORDER — ONDANSETRON HCL 4 MG/2ML IJ SOLN
4.0000 mg | Freq: Four times a day (QID) | INTRAMUSCULAR | Status: DC | PRN
  Administered 2014-07-17 – 2014-08-09 (×19): 4 mg via INTRAVENOUS
  Filled 2014-07-17 (×19): qty 2

## 2014-07-17 MED ORDER — ACETAMINOPHEN 650 MG RE SUPP: 650.0000 mg | Freq: Four times a day (QID) | RECTAL | Status: DC | PRN

## 2014-07-17 MED ORDER — SODIUM CHLORIDE 0.9 % IV BOLUS (SEPSIS)
500.0000 mL | Freq: Once | INTRAVENOUS | Status: AC
  Administered 2014-07-17: 500 mL via INTRAVENOUS

## 2014-07-17 MED ORDER — LABETALOL HCL 5 MG/ML IV SOLN
20.0000 mg | Freq: Once | INTRAVENOUS | Status: AC
  Administered 2014-07-17: 20 mg via INTRAVENOUS
  Filled 2014-07-17: qty 4

## 2014-07-17 MED ORDER — HYDRALAZINE HCL 20 MG/ML IJ SOLN
10.0000 mg | INTRAMUSCULAR | Status: DC | PRN
  Administered 2014-07-18 (×2): 10 mg via INTRAVENOUS
  Administered 2014-07-18: 08:00:00 via INTRAVENOUS
  Administered 2014-07-19 – 2014-07-21 (×2): 10 mg via INTRAVENOUS
  Filled 2014-07-17 (×6): qty 1

## 2014-07-17 MED ORDER — SODIUM CHLORIDE 0.9 % IJ SOLN
3.0000 mL | Freq: Two times a day (BID) | INTRAMUSCULAR | Status: DC
  Administered 2014-07-17 – 2014-08-09 (×37): 3 mL via INTRAVENOUS

## 2014-07-17 NOTE — ED Provider Notes (Signed)
CSN: [id number]     Arrival date & time 07/17/14  1304 History   First MD Initiated Contact with Patient 07/17/14 1547     Chief Complaint  Patient presents with  . Headache  . Nausea  . Hypertension     (Consider location/radiation/quality/duration/timing/severity/associated sxs/prior Treatment) Patient is a 59 y.o. female presenting with headaches and hypertension. The history is provided by the patient (the pt complains of a headache and was sent from her md for high bp ).  Headache Pain location:  Generalized Quality:  Dull Radiates to:  Does not radiate Severity currently:  7/10 Severity at highest:  9/10 Onset quality:  Gradual Timing:  Constant Progression:  Waxing and waning Chronicity:  New Associated symptoms: no abdominal pain, no back pain, no congestion, no cough, no diarrhea, no fatigue, no seizures and no sinus pressure   Hypertension Associated symptoms include headaches. Pertinent negatives include no chest pain and no abdominal pain.    Past Medical History  Diagnosis Date  . Wears glasses   . Osteopenia   . Seasonal allergies   . PONV (postoperative nausea and vomiting)    Past Surgical History  Procedure Laterality Date  . Tonsillectomy    . Colonoscopy    . Tumor excision  2000    right foot  . Carpal tunnel release Right 04/05/2014    Procedure: RIGHT CARPAL TUNNEL RELEASE;  Surgeon: Daryll Brod, MD;  Location: [facility];  Service: Orthopedics;  Laterality: Right;  ANESTHESIA:  IV REGIONAL FAB   History reviewed. No pertinent family history. History  Substance Use Topics  . Smoking status: Never Smoker   . Smokeless tobacco: Not on file  . Alcohol Use: Yes     Comment: occ   OB History    No data available     Review of Systems  Constitutional: Negative for appetite change and fatigue.  HENT: Negative for congestion, ear discharge and sinus pressure.   Eyes: Negative for discharge.  Respiratory: Negative for cough.    Cardiovascular: Negative for chest pain.  Gastrointestinal: Negative for abdominal pain and diarrhea.  Genitourinary: Negative for frequency and hematuria.  Musculoskeletal: Negative for back pain.  Skin: Negative for rash.  Neurological: Positive for headaches. Negative for seizures.  Psychiatric/Behavioral: Negative for hallucinations.      Allergies  Review of patient's allergies indicates no known allergies.  Home Medications   Prior to Admission medications   Medication Sig Start Date End Date Taking? Authorizing Provider  CALCIUM PO Take 1 tablet by mouth daily.   Yes Historical Provider, MD  Cholecalciferol 1000 UNITS capsule Take 1,000 Units by mouth daily.   Yes Historical Provider, MD  estradiol (VAGIFEM) 25 MCG vaginal tablet Place 25 mcg vaginally 2 (two) times a week.    Yes Historical Provider, MD  folic acid (FOLVITE) 1 MG tablet Take 1 mg by mouth daily. 06/11/14  Yes Historical Provider, MD  hydrochlorothiazide (MICROZIDE) 12.5 MG capsule Take 12.5 mg by mouth as needed (for fluid).    Yes Historical Provider, MD  predniSONE (DELTASONE) 5 MG tablet Take 20 mg by mouth daily. 06/12/14  Yes Historical Provider, MD  HYDROcodone-acetaminophen (NORCO) 5-325 MG per tablet Take 1 tablet by mouth every 6 (six) hours as needed for moderate pain. 04/05/14   Daryll Brod, MD   BP 212/102 mmHg  Pulse 69  Temp(Src) 98.3 F (36.8 C) (Oral)  Resp 16  Ht 5\' 4"  (1.626 m)  Wt 139 lb 4.8 oz (63.186  kg)  BMI 23.90 kg/m2  SpO2 98% Physical Exam  Constitutional: She is oriented to person, place, and time. She appears well-developed.  HENT:  Head: Normocephalic.  Eyes: Conjunctivae and EOM are normal. No scleral icterus.  Neck: Neck supple. No thyromegaly present.  Cardiovascular: Normal rate and regular rhythm.  Exam reveals no gallop and no friction rub.   No murmur heard. Pulmonary/Chest: No stridor. She has no wheezes. She has no rales. She exhibits no tenderness.  Abdominal: She  exhibits no distension. There is no tenderness. There is no rebound.  Musculoskeletal: Normal range of motion. She exhibits no edema.  2 plus edema  Lymphadenopathy:    She has no cervical adenopathy.  Neurological: She is oriented to person, place, and time. She exhibits normal muscle tone. Coordination normal.  Skin: No rash noted. No erythema.  Psychiatric: She has a normal mood and affect. Her behavior is normal.    ED Course  Procedures (including critical care time) Labs Review Labs Reviewed  COMPREHENSIVE METABOLIC PANEL - Abnormal; Notable for the following:    Sodium 129 (*)    Potassium 3.4 (*)    Chloride 90 (*)    Glucose, Bld 125 (*)    BUN 53 (*)    Creatinine, Ser 3.78 (*)    Total Protein 6.1 (*)    GFR calc non Af Amer 12 (*)    GFR calc Af Amer 14 (*)    All other components within normal limits  CBC WITH DIFFERENTIAL/PLATELET    Imaging Review Ct Head Wo Contrast  07/17/2014   CLINICAL DATA:  58 year old female with headache, nausea and leukocytosis  EXAM: CT HEAD WITHOUT CONTRAST  TECHNIQUE: Contiguous axial images were obtained from the base of the skull through the vertex without intravenous contrast.  COMPARISON:  None.  FINDINGS: Negative for acute intracranial hemorrhage, acute infarction, mass, mass effect, hydrocephalus or midline shift. Gray-white differentiation is preserved throughout. No acute soft tissue or calvarial abnormality. The globes and orbits are symmetric and unremarkable. Normal aeration of the mastoid air cells and visualized paranasal sinuses. Small sclerotic focus in the right frontal calvarium just above the orbit likely represents a benign bone island.  IMPRESSION: Negative head CT.   Electronically Signed   By: Jacqulynn Cadet M.D.   On: 07/17/2014 17:04     EKG Interpretation None      MDM   Final diagnoses:  Essential hypertension  Headache disorder  Renal insufficiency    Pt to be admitted for uncontrolled bp and  renal insuff.    This could be related to her RA and prednisone    Milton Ferguson, MD 07/17/14 [id number]

## 2014-07-17 NOTE — ED Notes (Signed)
Admitting MD at bedside.

## 2014-07-17 NOTE — ED Notes (Signed)
Pt here for headache, nausea and increased WBC. Sent by doctor. Pt hypertensive 215/109 at triage.

## 2014-07-17 NOTE — H&P (Addendum)
PCP:  [place]  Referring provider [place]   Chief Complaint:  Headache, nausea, vomiting  HPI: Erin Morgan is a 59 y.o. female   has a past medical history of Wears glasses; Osteopenia; Seasonal allergies; and PONV (postoperative nausea and vomiting).   Presented with 3-4 months edema and generalized joint pain.  Patient was diagnosed with RA since MAY she was prednisone at first and then was started on methotrexate. But her blood work was abnormal with rising Cr and anemia. He methotrexate was stopped with last dose being almost 2 weeks ago. She have had a nausea for few weeks and continued to have significant edema. Her rheumatologist started her on HCTZ to help with swelling. She have had a headache off and on for few weeks but gotten persistently worse. Now it became constant. She had to wake up in the night to take additional tylenol. Presented to PCP and had blood work today showing WBC of 15. She was instructed to come to ER. CT of the head was unremarkable. CR was noted to be up to 3.78, HG 9.4 WBC on repeat was down to 10.4 Denies any fever or chills. 2 weeks ago she traveled to [place] where she had fallen and broke her right arm. She was seen by orthopedics and is wearing a splint.  She reports decreased PO intake today, have had decreased urine output last urination was few hours ago and reports no change in color. 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   Review of Systems:    Pertinent positives include:  joint pain. nausea, vomiting joint pain or no joint swelling.  Constitutional:  No weight loss, night sweats, Fevers, chills, fatigue, weight loss  HEENT:  No headaches, Difficulty swallowing,Tooth/dental problems,Sore throat,  No sneezing, itching, ear ache, nasal congestion, post nasal drip,  Cardio-vascular:  No chest pain,  Orthopnea, PND, anasarca, dizziness, palpitations  no Bilateral lower extremity swelling  GI:  No heartburn, indigestion, abdominal pain, , diarrhea, change in bowel habits, loss of appetite, melena, blood in stool, hematemesis Resp:  no shortness of breath at rest. No dyspnea on exertion, No excess mucus, no productive cough, No non-productive cough, No coughing up of blood.No change in color of mucus.No wheezing. Skin:  no rash or lesions. No jaundice GU:  no dysuria, change in color of urine, no urgency or frequency. No straining to urinate.  No flank pain.  Musculoskeletal:  No  No decreased range of motion. No back pain.  Psych:  No change in mood or affect. No depression or anxiety. No memory loss.  Neuro: no localizing neurological complaints, no tingling, no weakness, no double vision, no gait abnormality, no slurred speech, no confusion  Otherwise ROS are negative except for above, 10 systems were reviewed  Past Medical History: Past Medical History  Diagnosis Date  . Wears glasses   . Osteopenia   . Seasonal allergies   . PONV (postoperative nausea and vomiting)    Past Surgical History  Procedure Laterality Date  . Tonsillectomy    . Colonoscopy    . Tumor excision  2000    right foot  . Carpal tunnel release Right 04/05/2014    Procedure: RIGHT CARPAL TUNNEL RELEASE;  Surgeon: Daryll Brod, MD;  Location: [facility];  Service: Orthopedics;  Laterality: Right;  ANESTHESIA:  IV REGIONAL FAB     Medications: Prior to Admission medications  Medication Sig Start Date End Date Taking? Authorizing Provider  CALCIUM PO Take 1 tablet by mouth daily.   Yes Historical Provider, MD  Cholecalciferol 1000 UNITS capsule Take 1,000 Units by mouth daily.   Yes Historical Provider, MD  estradiol (VAGIFEM) 25 MCG vaginal tablet Place 25 mcg vaginally 2 (two) times a week.    Yes Historical Provider, MD  folic acid (FOLVITE) 1 MG tablet Take 1 mg by mouth daily. 06/11/14   Yes Historical Provider, MD  hydrochlorothiazide (MICROZIDE) 12.5 MG capsule Take 12.5 mg by mouth as needed (for fluid).    Yes Historical Provider, MD  predniSONE (DELTASONE) 5 MG tablet Take 20 mg by mouth daily. 06/12/14  Yes Historical Provider, MD  HYDROcodone-acetaminophen (NORCO) 5-325 MG per tablet Take 1 tablet by mouth every 6 (six) hours as needed for moderate pain. 04/05/14   Daryll Brod, MD    Allergies:  No Known Allergies  Social History:  Ambulatory   independently   Lives at home alone,      reports that she has never smoked. She does not have any smokeless tobacco history on file. She reports that she does not drink alcohol or use illicit drugs.    Family History: family history includes Cervical cancer in her mother; Hypertension in her father and mother; Rheum arthritis in her other; Stroke in her father.    Physical Exam: Patient Vitals for the past 24 hrs:  BP Temp Temp src Pulse Resp SpO2 Height Weight  07/17/14 1945 (!) 174/105 mmHg - - (!) 59 19 98 % - -  07/17/14 1930 (!) 204/100 mmHg - - 62 11 97 % - -  07/17/14 1915 184/93 mmHg - - (!) 56 17 97 % - -  07/17/14 1900 (!) 208/99 mmHg - - 60 12 98 % - -  07/17/14 1845 (!) 203/102 mmHg - - 65 13 98 % - -  07/17/14 1830 (!) 205/101 mmHg - - 68 13 97 % - -  07/17/14 1815 (!) 212/102 mmHg - - 69 16 98 % - -  07/17/14 1800 (!) 202/102 mmHg - - 63 14 94 % - -  07/17/14 1745 199/94 mmHg - - 61 11 96 % - -  07/17/14 1736 (!) 214/99 mmHg - - 64 15 93 % - -  07/17/14 1730 (!) 214/99 mmHg - - 66 14 95 % - -  07/17/14 1630 (!) 210/106 mmHg - - 73 13 100 % - -  07/17/14 1615 (!) 221/111 mmHg - - 68 10 100 % - -  07/17/14 1339 (!) 215/109 mmHg 98.3 F (36.8 C) Oral 76 18 99 % - -  07/17/14 1337 (!) 215/109 mmHg 98.4 F (36.9 C) Oral 74 18 98 % 5\' 4"  (1.626 m) 63.186 kg (139 lb 4.8 oz)    1. General:  in No Acute distress 2. Psychological: Alert and  Oriented 3. Head/ENT:    Dry Mucous Membranes                           Head Non traumatic, neck supple                          Normal  Dentition                          very pale mucosa 4. SKIN:  decreased Skin turgor,  Skin clean  Dry and intact no rash 5. Heart: Regular rate and rhythm no Murmur, Rub or gallop 6. Lungs: Clear to auscultation bilaterally, no wheezes or crackles   7. Abdomen: Soft, non-tender, Non distended 8. Lower extremities: no clubbing, cyanosis, trace edema Skin on the fingers appears to be tight 9. Neurologically strength 5 out of 5 cranial nerves II through XII intact 10. MSK: Normal range of motion  body mass index is 23.9 kg/(m^2).   Labs on Admission:   Results for orders placed or performed during the hospital encounter of 07/17/14 (from the past 24 hour(s))  Comprehensive metabolic panel     Status: Abnormal   Collection Time: 07/17/14  4:25 PM  Result Value Ref Range   Sodium 129 (L) 135 - 145 mmol/L   Potassium 3.4 (L) 3.5 - 5.1 mmol/L   Chloride 90 (L) 101 - 111 mmol/L   CO2 25 22 - 32 mmol/L   Glucose, Bld 125 (H) 65 - 99 mg/dL   BUN 53 (H) 6 - 20 mg/dL   Creatinine, Ser 3.78 (H) 0.44 - 1.00 mg/dL   Calcium 8.9 8.9 - 10.3 mg/dL   Total Protein 6.1 (L) 6.5 - 8.1 g/dL   Albumin 3.9 3.5 - 5.0 g/dL   AST 33 15 - 41 U/L   ALT 19 14 - 54 U/L   Alkaline Phosphatase 51 38 - 126 U/L   Total Bilirubin 1.1 0.3 - 1.2 mg/dL   GFR calc non Af Amer 12 (L) >60 mL/min   GFR calc Af Amer 14 (L) >60 mL/min   Anion gap 14 5 - 15  CBC with Differential/Platelet     Status: Abnormal   Collection Time: 07/17/14  4:25 PM  Result Value Ref Range   WBC 10.4 4.0 - 10.5 K/uL   RBC 3.12 (L) 3.87 - 5.11 MIL/uL   Hemoglobin 9.4 (L) 12.0 - 15.0 g/dL   HCT 28.4 (L) 36.0 - 46.0 %   MCV 91.0 78.0 - 100.0 fL   MCH 30.1 26.0 - 34.0 pg   MCHC 33.1 30.0 - 36.0 g/dL   RDW 18.0 (H) 11.5 - 15.5 %   Platelets 175 150 - 400 K/uL   Neutrophils Relative % 86 (H) 43 - 77 %   Neutro Abs 9.0 (H) 1.7 - 7.7 K/uL   Lymphocytes Relative 6 (L) 12 -  46 %   Lymphs Abs 0.6 (L) 0.7 - 4.0 K/uL   Monocytes Relative 8 3 - 12 %   Monocytes Absolute 0.8 0.1 - 1.0 K/uL   Eosinophils Relative 0 0 - 5 %   Eosinophils Absolute 0.0 0.0 - 0.7 K/uL   Basophils Relative 0 0 - 1 %   Basophils Absolute 0.0 0.0 - 0.1 K/uL    UA ordered  No results found for: HGBA1C  Estimated Creatinine Clearance: 13.8 mL/min (by C-G formula based on Cr of 3.78).  BNP (last 3 results) No results for input(s): PROBNP in the last 8760 hours.  Other results:  I have pearsonaly reviewed this: ECG REPORT  Rate: 81  Rhythm: NSR ST&T Change: no ischemic changes QTC 477    Filed Weights   07/17/14 1337  Weight: 63.186 kg (139 lb 4.8 oz)     Cultures: No results found for: SDES, SPECREQUEST, CULT, REPTSTATUS   Radiological Exams on Admission: Ct Head Wo Contrast  07/17/2014   CLINICAL DATA:  59 year old female with headache, nausea and leukocytosis  EXAM: CT HEAD WITHOUT CONTRAST  TECHNIQUE: Contiguous axial images  were obtained from the base of the skull through the vertex without intravenous contrast.  COMPARISON:  None.  FINDINGS: Negative for acute intracranial hemorrhage, acute infarction, mass, mass effect, hydrocephalus or midline shift. Gray-white differentiation is preserved throughout. No acute soft tissue or calvarial abnormality. The globes and orbits are symmetric and unremarkable. Normal aeration of the mastoid air cells and visualized paranasal sinuses. Small sclerotic focus in the right frontal calvarium just above the orbit likely represents a benign bone island.  IMPRESSION: Negative head CT.   Electronically Signed   By: Jacqulynn Cadet M.D.   On: 07/17/2014 17:04    Chart has been reviewed  Family   at  Bedside  plan of care was discussed with [facility]) [phone] home [phone] cell   Assessment/Plan  59 year old female with history of rheumatoid arthritis presents with acute hypertension and renal failure  Present  on Admission:  . Acute renal failure - discussed with nephrologist, will obtain rheumatological workup. Patient has been diagnosed with rheumatoid arthritis but some of her physical findings  worrisome for scleroderma  . Rheumatoid arthritis - for now continue prednisone. We will need father rheumatological workup  . Hypertensive urgency - patient was given labetalol by developed slight bradycardia. Blood pressure back up now. Will admit to step down. We'll attempt to use hydralazine IV does not help Start Nitro Drip. Perineal Nephrology There Is a Possibility of Scleroderma Crisis in Which Case Patient May Need ACE Inhibitor to Control Blood Pressure   hyponatremia - obtain urine electrolytes possibly secondary to renal dysfunction Headache thought to be secondary to hypertensive urgency. CT scan of the head was unremarkable and neurologically patient is intact given ongoing hypertensive urgency we will obtain MRI to evaluate for PRESS Elevated troponin minimally elevated troponin no chest pain or shortness of breath no EKG changes she'll continue to follow most likely secondary to hypertensive urgency we'll attempt to control blood pressure currently has been coming down to 160 range patient developed transient bradycardia with administration of albuterol will hold off on beta blockers for now. Incidental using nitro drip. For now continue with hydralazine Since patient has responded  Code Status:  FULL CODE  as per patient    Disposition:  To home once workup is complete and patient is stable  Other plan as per orders.  I have spent a total of 75 min on this admission extra time was taken to discuss case with nephrology and family extensively.   Chili 07/17/2014, 8:29 PM  [facility]  Pager [phone]   after 2 AM please page floor coverage PA If 7AM-7PM, please contact the day team taking care of the patient  [email or web address]  Password [email or web address]

## 2014-07-18 ENCOUNTER — Inpatient Hospital Stay (HOSPITAL_COMMUNITY): Payer: BC Managed Care – PPO

## 2014-07-18 DIAGNOSIS — I10 Essential (primary) hypertension: Secondary | ICD-10-CM

## 2014-07-18 DIAGNOSIS — R51 Headache: Secondary | ICD-10-CM

## 2014-07-18 DIAGNOSIS — N289 Disorder of kidney and ureter, unspecified: Secondary | ICD-10-CM

## 2014-07-18 DIAGNOSIS — R519 Headache, unspecified: Secondary | ICD-10-CM | POA: Diagnosis present

## 2014-07-18 DIAGNOSIS — R7989 Other specified abnormal findings of blood chemistry: Secondary | ICD-10-CM

## 2014-07-18 DIAGNOSIS — R778 Other specified abnormalities of plasma proteins: Secondary | ICD-10-CM | POA: Diagnosis present

## 2014-07-18 LAB — CBC WITH DIFFERENTIAL/PLATELET
Basophils Absolute: 0 10*3/uL (ref 0.0–0.1)
Basophils Relative: 0 % (ref 0–1)
EOS PCT: 0 % (ref 0–5)
Eosinophils Absolute: 0 10*3/uL (ref 0.0–0.7)
HCT: 26.6 % — ABNORMAL LOW (ref 36.0–46.0)
Hemoglobin: 8.9 g/dL — ABNORMAL LOW (ref 12.0–15.0)
Lymphocytes Relative: 7 % — ABNORMAL LOW (ref 12–46)
Lymphs Abs: 0.9 10*3/uL (ref 0.7–4.0)
MCH: 30.5 pg (ref 26.0–34.0)
MCHC: 33.5 g/dL (ref 30.0–36.0)
MCV: 91.1 fL (ref 78.0–100.0)
MONO ABS: 2 10*3/uL — AB (ref 0.1–1.0)
MONOS PCT: 15 % — AB (ref 3–12)
Neutro Abs: 9.9 10*3/uL — ABNORMAL HIGH (ref 1.7–7.7)
Neutrophils Relative %: 78 % — ABNORMAL HIGH (ref 43–77)
PLATELETS: 174 10*3/uL (ref 150–400)
RBC: 2.92 MIL/uL — ABNORMAL LOW (ref 3.87–5.11)
RDW: 18.4 % — ABNORMAL HIGH (ref 11.5–15.5)
WBC: 12.8 10*3/uL — ABNORMAL HIGH (ref 4.0–10.5)

## 2014-07-18 LAB — CBC
HCT: 26.8 % — ABNORMAL LOW (ref 36.0–46.0)
Hemoglobin: 9 g/dL — ABNORMAL LOW (ref 12.0–15.0)
MCH: 30.3 pg (ref 26.0–34.0)
MCHC: 33.6 g/dL (ref 30.0–36.0)
MCV: 90.2 fL (ref 78.0–100.0)
Platelets: 163 10*3/uL (ref 150–400)
RBC: 2.97 MIL/uL — ABNORMAL LOW (ref 3.87–5.11)
RDW: 18.1 % — AB (ref 11.5–15.5)
WBC: 12.4 10*3/uL — AB (ref 4.0–10.5)

## 2014-07-18 LAB — COMPREHENSIVE METABOLIC PANEL
ALK PHOS: 45 U/L (ref 38–126)
ALT: 17 U/L (ref 14–54)
AST: 27 U/L (ref 15–41)
Albumin: 3.5 g/dL (ref 3.5–5.0)
Anion gap: 13 (ref 5–15)
BILIRUBIN TOTAL: 1 mg/dL (ref 0.3–1.2)
BUN: 55 mg/dL — AB (ref 6–20)
CALCIUM: 8.5 mg/dL — AB (ref 8.9–10.3)
CHLORIDE: 93 mmol/L — AB (ref 101–111)
CO2: 25 mmol/L (ref 22–32)
CREATININE: 4.23 mg/dL — AB (ref 0.44–1.00)
GFR calc non Af Amer: 11 mL/min — ABNORMAL LOW (ref >60)
GFR, EST AFRICAN AMERICAN: 12 mL/min — AB (ref >60)
Glucose, Bld: 119 mg/dL — ABNORMAL HIGH (ref 65–99)
Potassium: 3 mmol/L — ABNORMAL LOW (ref 3.5–5.1)
SODIUM: 131 mmol/L — AB (ref 135–145)
TOTAL PROTEIN: 5.6 g/dL — AB (ref 6.5–8.1)

## 2014-07-18 LAB — RENAL FUNCTION PANEL
ALBUMIN: 3.5 g/dL (ref 3.5–5.0)
ANION GAP: 11 (ref 5–15)
BUN: 57 mg/dL — ABNORMAL HIGH (ref 6–20)
CO2: 25 mmol/L (ref 22–32)
Calcium: 8.5 mg/dL — ABNORMAL LOW (ref 8.9–10.3)
Chloride: 95 mmol/L — ABNORMAL LOW (ref 101–111)
Creatinine, Ser: 4.62 mg/dL — ABNORMAL HIGH (ref 0.44–1.00)
GFR, EST AFRICAN AMERICAN: 11 mL/min — AB (ref >60)
GFR, EST NON AFRICAN AMERICAN: 9 mL/min — AB (ref >60)
Glucose, Bld: 142 mg/dL — ABNORMAL HIGH (ref 65–99)
POTASSIUM: 4.1 mmol/L (ref 3.5–5.1)
Phosphorus: 6.1 mg/dL — ABNORMAL HIGH (ref 2.5–4.6)
Sodium: 131 mmol/L — ABNORMAL LOW (ref 135–145)

## 2014-07-18 LAB — TROPONIN I
TROPONIN I: 0.06 ng/mL — AB (ref <0.031)
Troponin I: 0.07 ng/mL — ABNORMAL HIGH (ref <0.031)
Troponin I: 0.06 ng/mL — ABNORMAL HIGH (ref <0.031)
Troponin I: 0.09 ng/mL — ABNORMAL HIGH (ref <0.031)

## 2014-07-18 LAB — PROTIME-INR
INR: 0.98 (ref 0.00–1.49)
PROTHROMBIN TIME: 13.2 s (ref 11.6–15.2)

## 2014-07-18 LAB — CORTISOL: Cortisol, Plasma: 12 ug/dL

## 2014-07-18 LAB — CREATININE, SERUM
Creatinine, Ser: 4.36 mg/dL — ABNORMAL HIGH (ref 0.44–1.00)
GFR calc Af Amer: 12 mL/min — ABNORMAL LOW (ref >60)
GFR calc non Af Amer: 10 mL/min — ABNORMAL LOW (ref >60)

## 2014-07-18 LAB — PHOSPHORUS: PHOSPHORUS: 6.1 mg/dL — AB (ref 2.5–4.6)

## 2014-07-18 LAB — TSH: TSH: 3.698 u[IU]/mL (ref 0.350–4.500)

## 2014-07-18 LAB — FOLATE: FOLATE: 44 ng/mL (ref >5.9)

## 2014-07-18 LAB — OSMOLALITY, URINE: Osmolality, Ur: 322 mOsm/kg — ABNORMAL LOW (ref 390–1090)

## 2014-07-18 LAB — MRSA PCR SCREENING: MRSA BY PCR: NEGATIVE

## 2014-07-18 LAB — SEDIMENTATION RATE: SED RATE: 5 mm/h (ref 0–22)

## 2014-07-18 LAB — MAGNESIUM: Magnesium: 2.4 mg/dL (ref 1.7–2.4)

## 2014-07-18 MED ORDER — POTASSIUM CHLORIDE 10 MEQ/100ML IV SOLN
10.0000 meq | INTRAVENOUS | Status: AC
  Administered 2014-07-18 (×4): 10 meq via INTRAVENOUS
  Filled 2014-07-18 (×4): qty 100

## 2014-07-18 MED ORDER — CAPTOPRIL 6.25 MG HALF TABLET
6.2500 mg | ORAL_TABLET | Freq: Four times a day (QID) | ORAL | Status: DC
  Administered 2014-07-18 (×2): 6.25 mg via ORAL
  Filled 2014-07-18 (×4): qty 1

## 2014-07-18 MED ORDER — SODIUM CHLORIDE 0.9 % IV SOLN: INTRAVENOUS | Status: DC

## 2014-07-18 MED ORDER — CAPTOPRIL 12.5 MG PO TABS
12.5000 mg | ORAL_TABLET | Freq: Four times a day (QID) | ORAL | Status: DC
  Administered 2014-07-18 – 2014-07-20 (×6): 12.5 mg via ORAL
  Filled 2014-07-18 (×11): qty 1

## 2014-07-18 MED ORDER — HYDRALAZINE HCL 25 MG PO TABS
25.0000 mg | ORAL_TABLET | Freq: Three times a day (TID) | ORAL | Status: DC
  Administered 2014-07-18 – 2014-07-19 (×4): 25 mg via ORAL
  Filled 2014-07-18 (×7): qty 1

## 2014-07-18 MED ORDER — METOCLOPRAMIDE HCL 5 MG/ML IJ SOLN
10.0000 mg | Freq: Once | INTRAMUSCULAR | Status: AC
  Administered 2014-07-18: 10 mg via INTRAVENOUS
  Filled 2014-07-18: qty 2

## 2014-07-18 MED ORDER — DIPHENHYDRAMINE HCL 50 MG/ML IJ SOLN
25.0000 mg | Freq: Once | INTRAMUSCULAR | Status: AC
  Administered 2014-07-18: 25 mg via INTRAVENOUS
  Filled 2014-07-18: qty 1

## 2014-07-18 MED ORDER — SUMATRIPTAN SUCCINATE 6 MG/0.5ML ~~LOC~~ SOLN
6.0000 mg | Freq: Once | SUBCUTANEOUS | Status: AC
  Administered 2014-07-18: 6 mg via SUBCUTANEOUS
  Filled 2014-07-18: qty 0.5

## 2014-07-18 MED ORDER — STERILE WATER FOR INJECTION IV SOLN
150.0000 meq | INTRAVENOUS | Status: DC
  Administered 2014-07-18 – 2014-07-20 (×3): 150 meq via INTRAVENOUS
  Filled 2014-07-18 (×4): qty 850

## 2014-07-18 MED ORDER — KETOROLAC TROMETHAMINE 30 MG/ML IJ SOLN
30.0000 mg | Freq: Once | INTRAMUSCULAR | Status: AC
  Administered 2014-07-18: 30 mg via INTRAVENOUS
  Filled 2014-07-18: qty 1

## 2014-07-18 NOTE — Consult Note (Signed)
Reason for Consult:ARF, malignant HTN Referring Physician: Dia Crawford, MD  Erin Morgan is an 59 y.o. female.  HPI: Pt is a 59yo F with PMH sig for recent diagnosis of Rheumatoid arthritis and was started on prednisone and methotrexate in May, however she had abnormal labs on 07/11/14  (elevated Scr of 2.3) and methotrexate was not reordered (last dose about 2 weeks ago).  Over the last 2 weeks she has noticed some edema and was started on HCTZ and has been having nausea and headaches.  She continues to have "tight feeling" in fingers/hands.  Denies dysuria/pyuria/hematuria/foamy frothy urine.  Stopped NSAIDs in May when she was started on prednisone. Had normal BP as an outpt but now markedly elevated.  Has a h/o HTN after he husband died but was on a beta-blocker for short period of time due to drop in BP on low dose.  We were asked to help further evaluate and manage her ARF and Hypertensive emergency.  Trend in Creatinine: CREATININE, SER  Date/Time Value Ref Range Status  07/18/2014 04:30 AM 4.23* 0.44 - 1.00 mg/dL Final  07/18/2014 12:53 AM 4.36* 0.44 - 1.00 mg/dL Final  07/17/2014 04:25 PM 3.78* 0.44 - 1.00 mg/dL Final    PMH:   Past Medical History  Diagnosis Date  . Wears glasses   . Osteopenia   . Seasonal allergies   . PONV (postoperative nausea and vomiting)     PSH:   Past Surgical History  Procedure Laterality Date  . Tonsillectomy    . Colonoscopy    . Tumor excision  2000    right foot  . Carpal tunnel release Right 04/05/2014    Procedure: RIGHT CARPAL TUNNEL RELEASE;  Surgeon: Daryll Brod, MD;  Location: [place];  Service: Orthopedics;  Laterality: Right;  ANESTHESIA:  IV REGIONAL FAB    Allergies: No Known Allergies  Medications:   Prior to Admission medications   Medication Sig Start Date End Date Taking? Authorizing Provider  CALCIUM PO Take 1 tablet by mouth daily.   Yes Historical Provider, MD  Cholecalciferol 1000 UNITS capsule Take  1,000 Units by mouth daily.   Yes Historical Provider, MD  estradiol (VAGIFEM) 25 MCG vaginal tablet Place 25 mcg vaginally 2 (two) times a week.    Yes Historical Provider, MD  folic acid (FOLVITE) 1 MG tablet Take 1 mg by mouth daily. 06/11/14  Yes Historical Provider, MD  hydrochlorothiazide (MICROZIDE) 12.5 MG capsule Take 12.5 mg by mouth as needed (for fluid).    Yes Historical Provider, MD  predniSONE (DELTASONE) 5 MG tablet Take 20 mg by mouth daily. 06/12/14  Yes Historical Provider, MD  HYDROcodone-acetaminophen (NORCO) 5-325 MG per tablet Take 1 tablet by mouth every 6 (six) hours as needed for moderate pain. 04/05/14   Daryll Brod, MD    Inpatient medications: . hydrALAZINE  25 mg Oral 3 times per day  . potassium chloride  10 mEq Intravenous Q1 Hr x 4  . predniSONE  20 mg Oral QAC breakfast  . sodium chloride  3 mL Intravenous Q12H    Discontinued Meds:   Medications Discontinued During This Encounter  Medication Reason  . alendronate (FOSAMAX) 35 MG tablet Patient has not taken in last 30 days  . Biotin 10 MG CAPS Patient has not taken in last 30 days  . VITAMIN D, CHOLECALCIFEROL, PO Inpatient Standard    Social History:  reports that she has never smoked. She does not have any smokeless tobacco history on  file. She reports that she does not drink alcohol or use illicit drugs.  Family History:   Family History  Problem Relation Age of Onset  . Hypertension Mother   . Cervical cancer Mother   . Hypertension Father   . Stroke Father   . Rheum arthritis Other     Pertinent items are noted in HPI. Weight change:   Intake/Output Summary (Last 24 hours) at 07/18/14 0846 Last data filed at 07/17/14 2332  Gross per 24 hour  Intake      3 ml  Output    200 ml  Net   -197 ml   BP 203/89 mmHg  Pulse 90  Temp(Src) 98 F (36.7 C) (Oral)  Resp 20  Ht 5\' 4"  (1.626 m)  Wt 63.186 kg (139 lb 4.8 oz)  BMI 23.90 kg/m2  SpO2 98% Filed Vitals:   07/18/14 0630 07/18/14 0700  07/18/14 0800 07/18/14 0804  BP: 200/90 185/92 203/89 203/89  Pulse:    90  Temp:    98 F (36.7 C)  TempSrc:    Oral  Resp: 15 12 16 20   Height:      Weight:      SpO2:    98%     General appearance: fatigued and no distress Head: Normocephalic, without obvious abnormality, atraumatic Eyes: negative findings: lids and lashes normal, conjunctivae and sclerae normal and corneas clear Neck: no adenopathy, no carotid bruit, no JVD, supple, symmetrical, trachea midline and thyroid not enlarged, symmetric, no tenderness/mass/nodules Resp: clear to auscultation bilaterally Cardio: regular rate and rhythm, S1, S2 normal, no murmur, click, rub or gallop GI: soft, non-tender; bowel sounds normal; no masses,  no organomegaly Extremities: edema trace and tightening of digits/shiny appearance Mouth:  slight perioral skin tightening and decreased oral aperture  Labs: Basic Metabolic Panel:  Recent Labs Lab 07/17/14 1625 07/18/14 0053 07/18/14 0430  NA 129*  --  131*  K 3.4*  --  3.0*  CL 90*  --  93*  CO2 25  --  25  GLUCOSE 125*  --  119*  BUN 53*  --  55*  CREATININE 3.78* 4.36* 4.23*  ALBUMIN 3.9  --  3.5  CALCIUM 8.9  --  8.5*  PHOS  --   --  6.1*   Liver Function Tests:  Recent Labs Lab 07/17/14 1625 07/18/14 0430  AST 33 27  ALT 19 17  ALKPHOS 51 45  BILITOT 1.1 1.0  PROT 6.1* 5.6*  ALBUMIN 3.9 3.5   No results for input(s): LIPASE, AMYLASE in the last 168 hours. No results for input(s): AMMONIA in the last 168 hours. CBC:  Recent Labs Lab 07/17/14 1625 07/18/14 0053 07/18/14 0430  WBC 10.4 12.4* 12.8*  NEUTROABS 9.0*  --  9.9*  HGB 9.4* 9.0* 8.9*  HCT 28.4* 26.8* 26.6*  MCV 91.0 90.2 91.1  PLT 175 163 174   PT/INR: @LABRCNTIP (inr:5) Cardiac Enzymes: ) Recent Labs Lab 07/18/14 0053 07/18/14 0430  TROPONINI 0.07* 0.06*  0.06*   CBG: No results for input(s): GLUCAP in the last 168 hours.  Iron Studies:  Recent Labs Lab 07/17/14 2244  IRON  42  TIBC 290  FERRITIN 61    Xrays/Other Studies: Ct Head Wo Contrast  07/17/2014   CLINICAL DATA:  59 year old female with headache, nausea and leukocytosis  EXAM: CT HEAD WITHOUT CONTRAST  TECHNIQUE: Contiguous axial images were obtained from the base of the skull through the vertex without intravenous contrast.  COMPARISON:  None.  FINDINGS: Negative for acute intracranial hemorrhage, acute infarction, mass, mass effect, hydrocephalus or midline shift. Gray-white differentiation is preserved throughout. No acute soft tissue or calvarial abnormality. The globes and orbits are symmetric and unremarkable. Normal aeration of the mastoid air cells and visualized paranasal sinuses. Small sclerotic focus in the right frontal calvarium just above the orbit likely represents a benign bone island.  IMPRESSION: Negative head CT.   Electronically Signed   By: Jacqulynn Cadet M.D.   On: 07/17/2014 17:04   Mr Brain Wo Contrast  07/18/2014   CLINICAL DATA:  Initial evaluation for acute headache.  EXAM: MRI HEAD WITHOUT CONTRAST  TECHNIQUE: Multiplanar, multiecho pulse sequences of the brain and surrounding structures were obtained without intravenous contrast.  COMPARISON:  Prior CT from 07/17/2014  FINDINGS: The CSF containing spaces are within normal limits for patient age. Few scattered T2/FLAIR hyperintense foci present within the periventricular and deep white matter of both cerebral hemispheres, nonspecific. No mass lesion, midline shift, or extra-axial fluid collection. Ventricles are normal in size without evidence of hydrocephalus.  No diffusion-weighted signal abnormality is identified to suggest acute intracranial infarct. Gray-white matter differentiation is maintained. Normal flow voids are seen within the intracranial vasculature. No intracranial hemorrhage identified.  The cervicomedullary junction is normal. Pituitary gland is within normal limits. Pituitary stalk is midline. The globes and optic  nerves demonstrate a normal appearance with normal signal intensity. The  The bone marrow signal intensity is normal. Calvarium is intact. Visualized upper cervical spine is within normal limits.  Scalp soft tissues are unremarkable.  Paranasal sinuses are clear.  No mastoid effusion.  IMPRESSION: 1. No acute intracranial process identified. 2. Few scattered subcentimeter T2/FLAIR height matter foci within the supratentorial white matter. These foci are nonspecific, but may be related to very mild chronic small vessel ischemic changes. These are felt to be within normal limits for patient age. 3. Otherwise normal brain MRI   Electronically Signed   By: Jeannine Boga M.D.   On: 07/18/2014 06:53     Assessment/Plan: 1.  ARF- following methotrexate therapy and accelerated hypertension.  +microscopic hematuria and some proteinuria.  DDx is methotrexate toxicity, acute GN, scleroderma renal crisis, as well as renal artery stenosis. (she had rheumatologic workup in May and had a + ANA but negative SSA/SSB and antiscleroderma ab) 1. Serologies ordered and pending 2. Renal US with duplex also ordered to r/o RAS 3. Would start isotonic bicarb to alkalinize urine (which would help with mtx toxicity) 4. Would also do trial of captopril 6.25mg  q6 hours with close monitoring of Scr to treat possible scleroderma renal crisis 5. No indication for dialysis at this time 2. Hypertensive emergency- secondary HTN workup underway 1. Captopril as above for suspected scleroderma renal crisis 2. Follow BP and Scr closely 3. Anemia- new onset, guaiac stools and check iron stores.  Serologies for acute GN ordered 4. Leukocytosis 5. Hypokalemia- replete and follow after initiation of captopril 6. Rheumatoid arthritis- mtx on hold   Eniya Cannady A 07/18/2014, 8:46 AM

## 2014-07-18 NOTE — Progress Notes (Signed)
[place] TEAM 1 - Stepdown/ICU TEAM Progress Note  Erin Morgan [id number] DOB: 07-25-1955 DOA: 07/17/2014 PCP: Nicola Girt, DO  Admit HPI / Brief Narrative: Erin Morgan is a 59 y.o. female   has a past medical history of Wears glasses; Osteopenia; Seasonal allergies; and PONV (postoperative nausea and vomiting).   Presented with 3-4 months edema and generalized joint pain.  Patient was diagnosed with RA since MAY she was prednisone at first and then was started on methotrexate. But her blood work was abnormal with rising Cr and anemia. He methotrexate was stopped with last dose being almost 2 weeks ago. She have had a nausea for few weeks and continued to have significant edema. Her rheumatologist started her on HCTZ to help with swelling. She have had a headache off and on for few weeks but gotten persistently worse. Now it became constant. She had to wake up in the night to take additional tylenol. Presented to PCP and had blood work today showing WBC of 15. She was instructed to come to ER. CT of the head was unremarkable. CR was noted to be up to 3.78, HG 9.4 WBC on repeat was down to 10.4 Denies any fever or chills. 2 weeks ago she traveled to [place] where she had fallen and broke her right arm. She was seen by orthopedics and is wearing a splint.  She reports decreased PO intake today, have had decreased urine output last urination was few hours ago and reports no change in color. 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   HPI/Subjective: 7/14 A/O 4 status had increasing ankle/hand/knee swelling. States 2 weeks ago her methotrexate was stopped secondary to increasing creatinine. Also states has had a headache 2 weeks.      Assessment/Plan: Acute renal failure  - MCTD workup in progress. Scleroderma?   -Hold all nephrotoxic medication to include NSAIDs  Rheumatoid arthritis    -Continue prednisone 20 mg daily    Hypertensive urgency  - Captopril 6.25 mg QID; concerned about scleroderma induced hypertension -Nephrology has ordered renal ultrasound with duplex; R/O RAS? -Continue hydralazine 25 mg TID  -Currently patient's BP 178/84; would not try to lower further today. Admission BP 215/109. In the a.m. could increase medication to lower patient's BP another 10%. -Patient had been on hydralazine prior to admission anti-histone antibody pending as part of MCTD workup. -Echocardiogram pending  Migraine HA  -Started ~2 weeks ago -Most likely multifactorial HTN  Code Status: FULL Family Communication: family present at time of exam Disposition Plan: Resolution of hypertensive urgency/acute renal failure    Consultants: Dr.Joseph Engineer, maintenance (nephrology)   Procedure/Significant Events:    Culture   Antibiotics:   DVT prophylaxis: SCD   Devices NA   LINES / TUBES:  NA    Continuous Infusions: . sodium chloride 10 mL/hr at 07/18/14 1200  .  sodium bicarbonate 150 mEq in sterile water 1000 mL infusion 150 mEq (07/18/14 1302)    Objective: VITAL SIGNS: Temp: 97.9 F (36.6 C) (07/14 1652) Temp Source: Oral (07/14 1652) BP: 178/84 mmHg (07/14 1714) Pulse Rate: 94 (07/14 1652) SPO2; FIO2:   Intake/Output Summary (Last 24 hours) at 07/18/14 1813 Last data filed at 07/18/14 1300  Gross per 24 hour  Intake    313 ml  Output    400 ml  Net    -87 ml     Exam: General: A/O 4, and pain secondary to HA,  swelling of hands and knees, No acute respiratory distress Eyes: Positive headache, eye pain, negative scleral hemorrhage ENT: Negative Runny nose, negative ear pain, negative tinnitus, negative gingival bleeding Neck:  Negative scars, masses, torticollis, lymphadenopathy, positive JVD Lungs: Clear to auscultation bilaterally without wheezes or crackles Cardiovascular: Regular rate and rhythm without murmur gallop or rub normal S1  and S2 Abdomen:negative abdominal pain, negative dysphagia, Nontender, nondistended, soft, bowel sounds positive, no rebound, no ascites, no appreciable mass Extremities: No significant cyanosis. Bilateral hand swelling (sausage digits bilateral Lt>Rt), skin thickening bilateral hands and fingers. Psychiatric:  Negative depression, negative anxiety, negative fatigue, negative mania  Neurologic:  Cranial nerves II through XII intact, tongue/uvula midline, all extremities muscle strength 5/5, sensation intact throughout, negative dysarthria, negative expressive aphasia, negative receptive aphasia.      Data Reviewed: Basic Metabolic Panel:  Recent Labs Lab 07/17/14 1625 07/18/14 0053 07/18/14 0430 07/18/14 1605  NA 129*  --  131* 131*  K 3.4*  --  3.0* 4.1  CL 90*  --  93* 95*  CO2 25  --  25 25  GLUCOSE 125*  --  119* 142*  BUN 53*  --  55* 57*  CREATININE 3.78* 4.36* 4.23* 4.62*  CALCIUM 8.9  --  8.5* 8.5*  MG  --   --  2.4  --   PHOS  --   --  6.1* 6.1*   Liver Function Tests:  Recent Labs Lab 07/17/14 1625 07/18/14 0430 07/18/14 1605  AST 33 27  --   ALT 19 17  --   ALKPHOS 51 45  --   BILITOT 1.1 1.0  --   PROT 6.1* 5.6*  --   ALBUMIN 3.9 3.5 3.5   No results for input(s): LIPASE, AMYLASE in the last 168 hours. No results for input(s): AMMONIA in the last 168 hours. CBC:  Recent Labs Lab 07/17/14 1625 07/18/14 0053 07/18/14 0430  WBC 10.4 12.4* 12.8*  NEUTROABS 9.0*  --  9.9*  HGB 9.4* 9.0* 8.9*  HCT 28.4* 26.8* 26.6*  MCV 91.0 90.2 91.1  PLT 175 163 174   Cardiac Enzymes:  Recent Labs Lab 07/18/14 0053 07/18/14 0430 07/18/14 1104  TROPONINI 0.07* 0.06*  0.06* 0.09*   BNP (last 3 results) No results for input(s): BNP in the last 8760 hours.  ProBNP (last 3 results) No results for input(s): PROBNP in the last 8760 hours.  CBG: No results for input(s): GLUCAP in the last 168 hours.  Recent Results (from the past 240 hour(s))  Urine  culture     Status: None (Preliminary result)   Collection Time: 07/17/14  9:15 PM  Result Value Ref Range Status   Specimen Description URINE, CLEAN CATCH  Final   Special Requests NONE  Final   Culture NO GROWTH < 12 HOURS  Final   Report Status PENDING  Incomplete  MRSA PCR Screening     Status: None   Collection Time: 07/17/14 11:09 PM  Result Value Ref Range Status   MRSA by PCR NEGATIVE NEGATIVE Final    Comment:        The GeneXpert MRSA Assay (FDA approved for NASAL specimens only), is one component of a comprehensive MRSA colonization surveillance program. It is not intended to diagnose MRSA infection nor to guide or monitor treatment for MRSA infections.      Studies:  Recent x-ray studies have been reviewed in detail by the Attending Physician  Scheduled Meds:  Scheduled Meds: . captopril  6.25  mg Oral 4 times per day  . hydrALAZINE  25 mg Oral 3 times per day  . predniSONE  20 mg Oral QAC breakfast  . sodium chloride  3 mL Intravenous Q12H    Time spent on care of this patient: 40 mins   Vernie Piet, Geraldo Docker , MD  [facility] Office  [phone] Pager - [phone]  On-Call/Text Page:      [email or web address]      password [email or web address]  If 7PM-7AM, please contact night-coverage [email or web address] Password [facility] 07/18/2014, 6:13 PM   LOS: 1 day   Care during the described time interval was provided by me .  I have reviewed this patient's available data, including medical history, events of note, physical examination, and all test results as part of my evaluation. I have personally reviewed and interpreted all radiology studies.   Dia Crawford, MD [phone] Pager

## 2014-07-18 NOTE — Care Management Note (Signed)
Case Management Note  Patient Details  Name: Erin Morgan MRN: [id number] Date of Birth: January 24, 1955  Subjective/Objective:          Adm w htn crisis          Action/Plan: lives alone, pcp dr Doug Sou   Expected Discharge Date:                  Expected Discharge Plan:     In-House Referral:     Discharge planning Services     Post Acute Care Choice:    Choice offered to:     DME Arranged:    DME Agency:     HH Arranged:    [place] Agency:     Status of Service:     Medicare Important Message Given:    Date Medicare IM Given:    Medicare IM give by:    Date Additional Medicare IM Given:    Additional Medicare Important Message give by:     If discussed at [place] of Stay Meetings, dates discussed:    Additional Comments: ur review done.  Lacretia Leigh, RN 07/18/2014, 8:59 AM

## 2014-07-18 NOTE — Progress Notes (Signed)
Echocardiogram 2D Echocardiogram has been performed.  Tresa Res 07/18/2014, 12:17 PM

## 2014-07-19 LAB — PROTEIN / CREATININE RATIO, URINE
Creatinine, Urine: 74.67 mg/dL
Protein Creatinine Ratio: 0.88 mg/mg{Cre} — ABNORMAL HIGH (ref 0.00–0.15)
TOTAL PROTEIN, URINE: 66 mg/dL

## 2014-07-19 LAB — URINALYSIS, ROUTINE W REFLEX MICROSCOPIC
Bilirubin Urine: NEGATIVE
GLUCOSE, UA: NEGATIVE mg/dL
Hgb urine dipstick: NEGATIVE
Ketones, ur: NEGATIVE mg/dL
LEUKOCYTES UA: NEGATIVE
Nitrite: NEGATIVE
PROTEIN: 100 mg/dL — AB
Specific Gravity, Urine: 1.011 (ref 1.005–1.030)
UROBILINOGEN UA: 0.2 mg/dL (ref 0.0–1.0)
pH: 7.5 (ref 5.0–8.0)

## 2014-07-19 LAB — RENAL FUNCTION PANEL
ALBUMIN: 3.3 g/dL — AB (ref 3.5–5.0)
ALBUMIN: 3.3 g/dL — AB (ref 3.5–5.0)
ANION GAP: 12 (ref 5–15)
Anion gap: 14 (ref 5–15)
BUN: 60 mg/dL — ABNORMAL HIGH (ref 6–20)
BUN: 59 mg/dL — ABNORMAL HIGH (ref 6–20)
CALCIUM: 7.9 mg/dL — AB (ref 8.9–10.3)
CHLORIDE: 90 mmol/L — AB (ref 101–111)
CHLORIDE: 85 mmol/L — AB (ref 101–111)
CO2: 27 mmol/L (ref 22–32)
CO2: 28 mmol/L (ref 22–32)
CREATININE: 5.33 mg/dL — AB (ref 0.44–1.00)
Calcium: 7.8 mg/dL — ABNORMAL LOW (ref 8.9–10.3)
Creatinine, Ser: 5.06 mg/dL — ABNORMAL HIGH (ref 0.44–1.00)
GFR calc Af Amer: 10 mL/min — ABNORMAL LOW (ref >60)
GFR calc non Af Amer: 9 mL/min — ABNORMAL LOW (ref >60)
GFR calc non Af Amer: 8 mL/min — ABNORMAL LOW (ref >60)
GFR, EST AFRICAN AMERICAN: 9 mL/min — AB (ref >60)
Glucose, Bld: 101 mg/dL — ABNORMAL HIGH (ref 65–99)
Glucose, Bld: 128 mg/dL — ABNORMAL HIGH (ref 65–99)
POTASSIUM: 3.5 mmol/L (ref 3.5–5.1)
Phosphorus: 6.6 mg/dL — ABNORMAL HIGH (ref 2.5–4.6)
Phosphorus: 6.6 mg/dL — ABNORMAL HIGH (ref 2.5–4.6)
Potassium: 3.6 mmol/L (ref 3.5–5.1)
Sodium: 129 mmol/L — ABNORMAL LOW (ref 135–145)
Sodium: 127 mmol/L — ABNORMAL LOW (ref 135–145)

## 2014-07-19 LAB — OSMOLALITY: OSMOLALITY: 286 mosm/kg (ref 275–300)

## 2014-07-19 LAB — ANCA TITERS: P-ANCA: <1:20 {titer}

## 2014-07-19 LAB — URINE MICROSCOPIC-ADD ON

## 2014-07-19 LAB — URINE CULTURE

## 2014-07-19 LAB — MPO/PR-3 (ANCA) ANTIBODIES: ANCA Proteinase 3: <3.5 U/mL (ref 0.0–3.5)

## 2014-07-19 LAB — FANA STAINING PATTERNS

## 2014-07-19 LAB — ANTI-SCLERODERMA ANTIBODY

## 2014-07-19 LAB — CREATININE, URINE, RANDOM: CREATININE, URINE: 74.19 mg/dL

## 2014-07-19 LAB — OSMOLALITY, URINE: OSMOLALITY UR: 329 mosm/kg — AB (ref 390–1090)

## 2014-07-19 LAB — C3 COMPLEMENT: C3 COMPLEMENT: 85 mg/dL (ref 82–167)

## 2014-07-19 LAB — ANTINUCLEAR ANTIBODIES, IFA

## 2014-07-19 LAB — HISTONE ANTIBODIES, IGG, BLOOD: DNA-Histone: <1

## 2014-07-19 LAB — SODIUM, URINE, RANDOM: SODIUM UR: 33 mmol/L

## 2014-07-19 LAB — ANTI-DNA ANTIBODY, DOUBLE-STRANDED

## 2014-07-19 LAB — C4 COMPLEMENT: Complement C4, Body Fluid: 30 mg/dL (ref 14–44)

## 2014-07-19 LAB — RHEUMATOID FACTOR: Rhuematoid fact SerPl-aCnc: 8.4 IU/mL (ref 0.0–13.9)

## 2014-07-19 MED ORDER — BUTALBITAL-APAP-CAFFEINE 50-325-40 MG PO TABS
1.0000 | ORAL_TABLET | ORAL | Status: DC | PRN
  Administered 2014-07-19 (×3): 2 via ORAL
  Administered 2014-07-20 (×2): 1 via ORAL
  Administered 2014-07-21 (×3): 2 via ORAL
  Administered 2014-08-02 (×3): 1 via ORAL
  Administered 2014-08-03: 2 via ORAL
  Administered 2014-08-04: 1 via ORAL
  Filled 2014-07-19: qty 1
  Filled 2014-07-19 (×2): qty 2
  Filled 2014-07-19: qty 1
  Filled 2014-07-19 (×3): qty 2
  Filled 2014-07-19: qty 1
  Filled 2014-07-19 (×2): qty 2
  Filled 2014-07-19: qty 1
  Filled 2014-07-19 (×3): qty 2

## 2014-07-19 MED ORDER — SENNA 8.6 MG PO TABS
2.0000 | ORAL_TABLET | Freq: Every day | ORAL | Status: DC | PRN
  Administered 2014-07-19 – 2014-07-25 (×2): 17.2 mg via ORAL
  Filled 2014-07-19 (×5): qty 2

## 2014-07-19 MED ORDER — PROCHLORPERAZINE EDISYLATE 5 MG/ML IJ SOLN
10.0000 mg | Freq: Four times a day (QID) | INTRAMUSCULAR | Status: DC | PRN
  Administered 2014-07-22 – 2014-08-01 (×4): 10 mg via INTRAVENOUS
  Filled 2014-07-19 (×9): qty 2

## 2014-07-19 MED ORDER — HYDRALAZINE HCL 50 MG PO TABS
50.0000 mg | ORAL_TABLET | Freq: Three times a day (TID) | ORAL | Status: DC
  Administered 2014-07-19 – 2014-07-23 (×14): 50 mg via ORAL
  Filled 2014-07-19 (×19): qty 1

## 2014-07-19 NOTE — Progress Notes (Signed)
Labs at 1620 K 3.5, creat up to 5.33.   ACP

## 2014-07-19 NOTE — Progress Notes (Signed)
Patient ID: Erin Morgan, female   DOB: 09-29-1955, 59 y.o.   MRN: [id number] S:feels a little better O:BP 156/72 mmHg  Pulse 94  Temp(Src) 98.5 F (36.9 C) (Oral)  Resp 16  Ht '5\' 4"'  (1.626 m)  Wt 63.186 kg (139 lb 4.8 oz)  BMI 23.90 kg/m2  SpO2 97%  Intake/Output Summary (Last 24 hours) at 07/19/14 0840 Last data filed at 07/19/14 0600  Gross per 24 hour  Intake    970 ml  Output    500 ml  Net    470 ml   Intake/Output: I/O last 3 completed shifts: In: 9767 [I.V.:873; IV Piggyback:300] Out: 700 [Urine:500; Emesis/NG output:200]  Intake/Output this shift:    Weight change:  Gen:WD WN WF in NAD CVS:no rub Resp:cta HAL:PFXTKW Ext:+edema, +Raynaud's   Recent Labs Lab 07/17/14 1625 07/18/14 0053 07/18/14 0430 07/18/14 1605 07/19/14 0300  NA 129*  --  131* 131* 129*  K 3.4*  --  3.0* 4.1 3.6  CL 90*  --  93* 95* 90*  CO2 25  --  '25 25 27  ' GLUCOSE 125*  --  119* 142* 101*  BUN 53*  --  55* 57* 60*  CREATININE 3.78* 4.36* 4.23* 4.62* 5.06*  ALBUMIN 3.9  --  3.5 3.5 3.3*  CALCIUM 8.9  --  8.5* 8.5* 7.9*  PHOS  --   --  6.1* 6.1* 6.6*  AST 33  --  27  --   --   ALT 19  --  17  --   --    Liver Function Tests:  Recent Labs Lab 07/17/14 1625 07/18/14 0430 07/18/14 1605 07/19/14 0300  AST 33 27  --   --   ALT 19 17  --   --   ALKPHOS 51 45  --   --   BILITOT 1.1 1.0  --   --   PROT 6.1* 5.6*  --   --   ALBUMIN 3.9 3.5 3.5 3.3*   No results for input(s): LIPASE, AMYLASE in the last 168 hours. No results for input(s): AMMONIA in the last 168 hours. CBC:  Recent Labs Lab 07/17/14 1625 07/18/14 0053 07/18/14 0430  WBC 10.4 12.4* 12.8*  NEUTROABS 9.0*  --  9.9*  HGB 9.4* 9.0* 8.9*  HCT 28.4* 26.8* 26.6*  MCV 91.0 90.2 91.1  PLT 175 163 174   Cardiac Enzymes:  Recent Labs Lab 07/18/14 0053 07/18/14 0430 07/18/14 1104  TROPONINI 0.07* 0.06*  0.06* 0.09*   CBG: No results for input(s): GLUCAP in the last 168 hours.  Iron Studies:  Recent  Labs  07/17/14 2244  IRON 42  TIBC 290  FERRITIN 61   Studies/Results: Ct Head Wo Contrast  07/17/2014   CLINICAL DATA:  59 year old female with headache, nausea and leukocytosis  EXAM: CT HEAD WITHOUT CONTRAST  TECHNIQUE: Contiguous axial images were obtained from the base of the skull through the vertex without intravenous contrast.  COMPARISON:  None.  FINDINGS: Negative for acute intracranial hemorrhage, acute infarction, mass, mass effect, hydrocephalus or midline shift. Gray-white differentiation is preserved throughout. No acute soft tissue or calvarial abnormality. The globes and orbits are symmetric and unremarkable. Normal aeration of the mastoid air cells and visualized paranasal sinuses. Small sclerotic focus in the right frontal calvarium just above the orbit likely represents a benign bone island.  IMPRESSION: Negative head CT.   Electronically Signed   By: Jacqulynn Cadet M.D.   On: 07/17/2014 17:04   Mr Brain Lottie Dawson  Contrast  07/18/2014   CLINICAL DATA:  Initial evaluation for acute headache.  EXAM: MRI HEAD WITHOUT CONTRAST  TECHNIQUE: Multiplanar, multiecho pulse sequences of the brain and surrounding structures were obtained without intravenous contrast.  COMPARISON:  Prior CT from 07/17/2014  FINDINGS: The CSF containing spaces are within normal limits for patient age. Few scattered T2/FLAIR hyperintense foci present within the periventricular and deep white matter of both cerebral hemispheres, nonspecific. No mass lesion, midline shift, or extra-axial fluid collection. Ventricles are normal in size without evidence of hydrocephalus.  No diffusion-weighted signal abnormality is identified to suggest acute intracranial infarct. Gray-white matter differentiation is maintained. Normal flow voids are seen within the intracranial vasculature. No intracranial hemorrhage identified.  The cervicomedullary junction is normal. Pituitary gland is within normal limits. Pituitary stalk is midline.  The globes and optic nerves demonstrate a normal appearance with normal signal intensity. The  The bone marrow signal intensity is normal. Calvarium is intact. Visualized upper cervical spine is within normal limits.  Scalp soft tissues are unremarkable.  Paranasal sinuses are clear.  No mastoid effusion.  IMPRESSION: 1. No acute intracranial process identified. 2. Few scattered subcentimeter T2/FLAIR height matter foci within the supratentorial white matter. These foci are nonspecific, but may be related to very mild chronic small vessel ischemic changes. These are felt to be within normal limits for patient age. 3. Otherwise normal brain MRI   Electronically Signed   By: Jeannine Boga M.D.   On: 07/18/2014 06:53   US Renal  07/18/2014   CLINICAL DATA:  Acute renal failure.  EXAM: RENAL / URINARY TRACT ULTRASOUND COMPLETE  COMPARISON:  None.  FINDINGS: Right Kidney:  Length: 10.0 cm. Echogenicity within normal limits. No mass or hydronephrosis visualized.  Left Kidney:  Length: 9.4 cm. Echogenicity within normal limits. No mass or hydronephrosis visualized.  Bladder:  Appears normal for degree of bladder distention.  Small bilateral pleural effusions incidentally noted.  IMPRESSION: No evidence of hydronephrosis or other renal abnormality.  Small bilateral pleural effusions incidentally noted.   Electronically Signed   By: Earle Gell M.D.   On: 07/18/2014 11:55   . captopril  12.5 mg Oral 4 times per day  . hydrALAZINE  25 mg Oral 3 times per day  . predniSONE  20 mg Oral QAC breakfast  . sodium chloride  3 mL Intravenous Q12H    BMET    Component Value Date/Time   NA 129* 07/19/2014 0300   K 3.6 07/19/2014 0300   CL 90* 07/19/2014 0300   CO2 27 07/19/2014 0300   GLUCOSE 101* 07/19/2014 0300   BUN 60* 07/19/2014 0300   CREATININE 5.06* 07/19/2014 0300   CALCIUM 7.9* 07/19/2014 0300   GFRNONAA 9* 07/19/2014 0300   GFRAA 10* 07/19/2014 0300   CBC    Component Value Date/Time   WBC  12.8* 07/18/2014 0430   RBC 2.92* 07/18/2014 0430   RBC 3.02* 07/17/2014 2244   HGB 8.9* 07/18/2014 0430   HCT 26.6* 07/18/2014 0430   PLT 174 07/18/2014 0430   MCV 91.1 07/18/2014 0430   MCH 30.5 07/18/2014 0430   MCHC 33.5 07/18/2014 0430   RDW 18.4* 07/18/2014 0430   LYMPHSABS 0.9 07/18/2014 0430   MONOABS 2.0* 07/18/2014 0430   EOSABS 0.0 07/18/2014 0430   BASOSABS 0.0 07/18/2014 0430     Assessment/Plan: 1. ARF- following methotrexate therapy and accelerated hypertension. +microscopic hematuria and some proteinuria. DDx is methotrexate toxicity, acute GN, scleroderma renal crisis, as well as  renal artery stenosis. (she had rheumatologic workup in May and had a + ANA but negative SSA/SSB and antiscleroderma ab) 1. Serologies ordered and pending although normal C3, C4, ESR 2. Renal US without obstruction and waiting on duplex to r/o RAS 3. isotonic bicarb to alkalinize urine (which would help with mtx toxicity) 4. Increased captopril from 6.3m q6 hours to 12.534mwith some improvement in BP this morning.  (for possible scleroderma renal crisis) 5. No indication for dialysis at this time 6. May require renal biopsy if renal function continues to worsen and/or serologies lean toward acute GN. 2. Hypertensive emergency- secondary HTN workup underway 1. Captopril as above for suspected scleroderma renal crisis with some improvement 2. Follow BP and Scr closely 3. Anemia- new onset, guaiac stools and check iron stores. Serologies for acute GN ordered 1. Iron deficient- IV Feraheme [id number] ok with primary svc. 4. Leukocytosis- w/u underway 5. Hyponatremia- due to ARF 6. Hypokalemia- replete and follow after initiation of captopril 7. Rheumatoid arthritis- mtx on hold  Searra Carnathan A

## 2014-07-19 NOTE — Progress Notes (Signed)
[place] TEAM 1 - Stepdown/ICU TEAM Progress Note  Erin Morgan [id number] DOB: [birth year] DOA: 07/17/2014 PCP: Nicola Girt, DO  Admit HPI / Brief Narrative: 59 y.o. female w/ a history of RA and osteopenia who presented with 3-4 months of edema and generalized joint pain.  Patient was diagnosed with RA in May and was tx w/ prednisone initially and then was started on methotrexate. Due to rising Cr and anemia the methotrexate was stopped with last dose being almost 2 weeks prior to her admit.  Her Rheumatologist started her on HCTZ to help with swelling. She had a headache off and on for few weeks which became constant. She presented to her PCP and had blood work showing WBC of 15 and was instructed to come to ER.   In the ED CT of the head was unremarkable. CR was up to 3.78, HG 9.4 WBC 10.4  Of note 2 weeks prior she traveled to [place] where she had fell and broke her right arm. She was seen by Orthopedics and is wearing a splint.   HPI/Subjective: Pt c/o ongiong, unrelenting frontal HA.  She denies focal neurologic deficits, sob, cp, n/v, or abdom pain.  She states that nothing we have given her thus far has significantly improved her HA.    Assessment/Plan:  Acute renal failure  -MCTD workup in progress - Scleroderma?   -Hold all nephrotoxic medication to include NSAIDs -Nephrology following   Rheumatoid arthritis  -Continue prednisone 20 mg daily - cont to hold MTX  Hypertensive urgency  -Captopril QID per Nephrology for ?Scleroderma renal crisis  -Nephrology has ordered renal ultrasound with duplex R/O RAS -Patient had been on hydralazine prior to admission > anti-histone antibody pending as part of MCTD workup -Echocardiogram notes no impairment in systolic fxn or WMA  Persistent HA  -Started ~2 weeks ago - CT and MRI of head both w/o acute findings  -Most likely related to HTN - attempt to avoid narcotics to avoid rebound HA - trial of fioricet - trial of  compazine   Code Status: FULL Family Communication: mother and father present at time of exam  Disposition Plan: SDU  Consultants: Nephrology  Procedures: 7/14 TTE - EF 55-60 % - no regional wall motion abnormalities  Antibiotics: None  DVT prophylaxis: SCD  Objective: Blood pressure 156/72, pulse 94, temperature 98.5 F (36.9 C), temperature source Oral, resp. rate 16, height 5\' 4"  (1.626 m), weight 63.186 kg (139 lb 4.8 oz), SpO2 97 %.  Intake/Output Summary (Last 24 hours) at 07/19/14 0958 Last data filed at 07/19/14 0600  Gross per 24 hour  Intake    870 ml  Output    500 ml  Net    370 ml   Exam: General: No acute respiratory distress Lungs: Clear to auscultation bilaterally without wheezes or crackles Cardiovascular: Regular rate and rhythm without murmur gallop or rub normal S1 and S2 Abdomen: Nontender, nondistended, soft, bowel sounds positive, no rebound, no ascites, no appreciable mass Extremities: No significant cyanosis, clubbing, or edema bilateral lower extremities  Data Reviewed: Basic Metabolic Panel:  Recent Labs Lab 07/17/14 1625 07/18/14 0053 07/18/14 0430 07/18/14 1605 07/19/14 0300  NA 129*  --  131* 131* 129*  K 3.4*  --  3.0* 4.1 3.6  CL 90*  --  93* 95* 90*  CO2 25  --  25 25 27   GLUCOSE 125*  --  119* 142* 101*  BUN 53*  --  55* 57* 60*  CREATININE 3.78*  4.36* 4.23* 4.62* 5.06*  CALCIUM 8.9  --  8.5* 8.5* 7.9*  MG  --   --  2.4  --   --   PHOS  --   --  6.1* 6.1* 6.6*   Liver Function Tests:  Recent Labs Lab 07/17/14 1625 07/18/14 0430 07/18/14 1605 07/19/14 0300  AST 33 27  --   --   ALT 19 17  --   --   ALKPHOS 51 45  --   --   BILITOT 1.1 1.0  --   --   PROT 6.1* 5.6*  --   --   ALBUMIN 3.9 3.5 3.5 3.3*   CBC:  Recent Labs Lab 07/17/14 1625 07/18/14 0053 07/18/14 0430  WBC 10.4 12.4* 12.8*  NEUTROABS 9.0*  --  9.9*  HGB 9.4* 9.0* 8.9*  HCT 28.4* 26.8* 26.6*  MCV 91.0 90.2 91.1  PLT 175 163 174   Cardiac  Enzymes:  Recent Labs Lab 07/18/14 0053 07/18/14 0430 07/18/14 1104  TROPONINI 0.07* 0.06*  0.06* 0.09*    Recent Results (from the past 240 hour(s))  Urine culture     Status: None (Preliminary result)   Collection Time: 07/17/14  9:15 PM  Result Value Ref Range Status   Specimen Description URINE, CLEAN CATCH  Final   Special Requests NONE  Final   Culture NO GROWTH < 12 HOURS  Final   Report Status PENDING  Incomplete  MRSA PCR Screening     Status: None   Collection Time: 07/17/14 11:09 PM  Result Value Ref Range Status   MRSA by PCR NEGATIVE NEGATIVE Final    Comment:        The GeneXpert MRSA Assay (FDA approved for NASAL specimens only), is one component of a comprehensive MRSA colonization surveillance program. It is not intended to diagnose MRSA infection nor to guide or monitor treatment for MRSA infections.      Studies:  Recent x-ray studies have been reviewed in detail by the Attending Physician  Scheduled Meds:  Scheduled Meds: . captopril  12.5 mg Oral 4 times per day  . hydrALAZINE  25 mg Oral 3 times per day  . predniSONE  20 mg Oral QAC breakfast  . sodium chloride  3 mL Intravenous Q12H    Time spent on care of this patient: 35 mins  Cherene Altes, MD [facility] For Consults/Admissions - Flow Manager - [phone] Office  [phone]  Contact MD directly via text page:      [email or web address]      password [facility]  07/19/2014, 9:58 AM   LOS: 2 days

## 2014-07-20 ENCOUNTER — Inpatient Hospital Stay (HOSPITAL_COMMUNITY): Payer: BC Managed Care – PPO

## 2014-07-20 DIAGNOSIS — N19 Unspecified kidney failure: Secondary | ICD-10-CM

## 2014-07-20 LAB — RENAL FUNCTION PANEL
ALBUMIN: 3.2 g/dL — AB (ref 3.5–5.0)
ALBUMIN: 3.2 g/dL — AB (ref 3.5–5.0)
ANION GAP: 12 (ref 5–15)
Anion gap: 13 (ref 5–15)
BUN: 65 mg/dL — ABNORMAL HIGH (ref 6–20)
BUN: 65 mg/dL — AB (ref 6–20)
CHLORIDE: 80 mmol/L — AB (ref 101–111)
CO2: 29 mmol/L (ref 22–32)
CO2: 31 mmol/L (ref 22–32)
Calcium: 7.7 mg/dL — ABNORMAL LOW (ref 8.9–10.3)
Calcium: 7.9 mg/dL — ABNORMAL LOW (ref 8.9–10.3)
Chloride: 81 mmol/L — ABNORMAL LOW (ref 101–111)
Creatinine, Ser: 5.47 mg/dL — ABNORMAL HIGH (ref 0.44–1.00)
Creatinine, Ser: 5.48 mg/dL — ABNORMAL HIGH (ref 0.44–1.00)
GFR calc Af Amer: 9 mL/min — ABNORMAL LOW (ref >60)
GFR calc non Af Amer: 8 mL/min — ABNORMAL LOW (ref >60)
GFR, EST AFRICAN AMERICAN: 9 mL/min — AB (ref >60)
GFR, EST NON AFRICAN AMERICAN: 8 mL/min — AB (ref >60)
Glucose, Bld: 106 mg/dL — ABNORMAL HIGH (ref 65–99)
Glucose, Bld: 126 mg/dL — ABNORMAL HIGH (ref 65–99)
POTASSIUM: 3.5 mmol/L (ref 3.5–5.1)
Phosphorus: 7 mg/dL — ABNORMAL HIGH (ref 2.5–4.6)
Phosphorus: 6.8 mg/dL — ABNORMAL HIGH (ref 2.5–4.6)
Potassium: 3.3 mmol/L — ABNORMAL LOW (ref 3.5–5.1)
Sodium: 123 mmol/L — ABNORMAL LOW (ref 135–145)
Sodium: 123 mmol/L — ABNORMAL LOW (ref 135–145)

## 2014-07-20 MED ORDER — CAPTOPRIL 25 MG PO TABS
25.0000 mg | ORAL_TABLET | Freq: Four times a day (QID) | ORAL | Status: DC
  Administered 2014-07-20 – 2014-07-21 (×4): 25 mg via ORAL
  Filled 2014-07-20 (×10): qty 1

## 2014-07-20 MED ORDER — SODIUM CHLORIDE 0.9 % IV SOLN
510.0000 mg | Freq: Once | INTRAVENOUS | Status: AC
  Administered 2014-07-20: 510 mg via INTRAVENOUS
  Filled 2014-07-20: qty 17

## 2014-07-20 NOTE — Progress Notes (Signed)
Erin Morgan TEAM 1 - Stepdown/ICU TEAM Progress Note  Erin Morgan [id number] DOB: 1955/08/23 DOA: 07/17/2014 PCP: Nicola Girt, DO  Admit HPI / Brief Narrative: 59 y.o. female w/ a history of RA and osteopenia who presented with 3-4 months of edema and generalized joint pain.  Patient was diagnosed with RA in May and was tx w/ prednisone initially and then was started on methotrexate. Due to rising Cr and anemia the methotrexate was stopped with last dose being almost 2 weeks prior to her admit.  Her Rheumatologist started her on HCTZ to help with swelling. She had a headache off and on for few weeks which became constant. She presented to her PCP and had blood work showing WBC of 15 and was instructed to come to ER.   In the ED CT of the head was unremarkable. CR was up to 3.78, HG 9.4 WBC 10.4  Of note 2 weeks prior she traveled to [place] where she had fell and broke her right arm. She was seen by Orthopedics and is wearing a splint.   HPI/Subjective: The patient's headache has improved significantly with the addition of Fioricet.  She denies chest pain nausea vomiting or abdominal pain.  She is anxious to get home.   Assessment/Plan:  Acute renal failure  -MCTD workup in progress - Scleroderma?   -Hold all nephrotoxic medication to include NSAIDs -Nephrology following  -No evidence of renal artery stenosis per ultrasound today  Rheumatoid arthritis  -Continue prednisone 20 mg daily - cont to hold MTX - appears reasonably quiescent at present  Normocytic anemia -Iron studies suggest iron deficiency - will dose with Feraheme as suggested by nephrology  Hyponatremia -Due to acute renal failure - nephrology adjusting IV fluid  Hypokalemia -Likely due to IV bicarbonate - replace and follow  Hypertensive urgency  -Captopril QID per Nephrology for ?Scleroderma renal crisis with dose being increased today -Nephrology has ordered renal ultrasound with duplex R/O  RAS -Patient had been on hydralazine prior to admission > anti-histone antibody pending as part of MCTD workup -Echocardiogram notes no impairment in systolic fxn or WMA  Persistent HA  -Started ~2 weeks ago - CT and MRI of head both w/o acute findings  -Most likely related to HTN - attempt to avoid narcotics to avoid rebound HA - trial of fioricet - trial of compazine   Code Status: FULL Family Communication: mother present at time of exam  Disposition Plan: SDU  Consultants: Nephrology  Procedures: 7/14 TTE - EF 55-60 % - no regional wall motion abnormalities  Antibiotics: None  DVT prophylaxis: SCD  Objective: Blood pressure 182/84, pulse 94, temperature 98 F (36.7 C), temperature source Oral, resp. rate 16, height 5\' 4"  (1.626 m), weight 63.186 kg (139 lb 4.8 oz), SpO2 95 %.  Intake/Output Summary (Last 24 hours) at 07/20/14 1727 Last data filed at 07/20/14 0700  Gross per 24 hour  Intake    940 ml  Output    660 ml  Net    280 ml   Exam: General: No acute respiratory distress Lungs: Clear to auscultation bilaterally without wheezes / crackles Cardiovascular: Regular rate and rhythm without murmur gallop or rub  Abdomen: Nontender, nondistended, soft, bowel sounds positive, no rebound, no ascites, no appreciable mass Extremities: No significant cyanosis, clubbing, edema bilateral lower extremities  Data Reviewed: Basic Metabolic Panel:  Recent Labs Lab 07/18/14 0430 07/18/14 1605 07/19/14 0300 07/19/14 1620 07/20/14 0223  NA 131* 131* 129* 127* 123*  K 3.0* 4.1  3.6 3.5 3.3*  CL 93* 95* 90* 85* 81*  CO2 25 25 27 28 29   GLUCOSE 119* 142* 101* 128* 106*  BUN 55* 57* 60* 59* 65*  CREATININE 4.23* 4.62* 5.06* 5.33* 5.47*  CALCIUM 8.5* 8.5* 7.9* 7.8* 7.7*  MG 2.4  --   --   --   --   PHOS 6.1* 6.1* 6.6* 6.6* 7.0*   Liver Function Tests:  Recent Labs Lab 07/17/14 1625 07/18/14 0430 07/18/14 1605 07/19/14 0300 07/19/14 1620 07/20/14 0223  AST 33  27  --   --   --   --   ALT 19 17  --   --   --   --   ALKPHOS 51 45  --   --   --   --   BILITOT 1.1 1.0  --   --   --   --   PROT 6.1* 5.6*  --   --   --   --   ALBUMIN 3.9 3.5 3.5 3.3* 3.3* 3.2*   CBC:  Recent Labs Lab 07/17/14 1625 07/18/14 0053 07/18/14 0430  WBC 10.4 12.4* 12.8*  NEUTROABS 9.0*  --  9.9*  HGB 9.4* 9.0* 8.9*  HCT 28.4* 26.8* 26.6*  MCV 91.0 90.2 91.1  PLT 175 163 174   Cardiac Enzymes:  Recent Labs Lab 07/18/14 0053 07/18/14 0430 07/18/14 1104  TROPONINI 0.07* 0.06*  0.06* 0.09*    Recent Results (from the past 240 hour(s))  Urine culture     Status: None   Collection Time: 07/17/14  9:15 PM  Result Value Ref Range Status   Specimen Description URINE, CLEAN CATCH  Final   Special Requests NONE  Final   Culture   Final    MULTIPLE SPECIES PRESENT, SUGGEST RECOLLECTION IF CLINICALLY INDICATED   Report Status 07/19/2014 FINAL  Final  MRSA PCR Screening     Status: None   Collection Time: 07/17/14 11:09 PM  Result Value Ref Range Status   MRSA by PCR NEGATIVE NEGATIVE Final    Comment:        The GeneXpert MRSA Assay (FDA approved for NASAL specimens only), is one component of a comprehensive MRSA colonization surveillance program. It is not intended to diagnose MRSA infection nor to guide or monitor treatment for MRSA infections.      Studies:  Recent x-ray studies have been reviewed in detail by the Attending Physician  Scheduled Meds:  Scheduled Meds: . captopril  25 mg Oral 4 times per day  . hydrALAZINE  50 mg Oral 3 times per day  . predniSONE  20 mg Oral QAC breakfast  . sodium chloride  3 mL Intravenous Q12H    Time spent on care of this patient: 35 mins  Cherene Altes, MD [facility] For Consults/Admissions - Flow Manager - [phone] Office  [phone]  Contact MD directly via text page:      [email or web address]      password [facility]  07/20/2014, 5:27 PM   LOS: 3 days

## 2014-07-20 NOTE — Progress Notes (Signed)
Patient ID: Yaremi Stahlman, female   DOB: 1955-04-17, 59 y.o.   MRN: [id number] S:feels better O:BP 135/70 mmHg  Pulse 94  Temp(Src) 97.7 F (36.5 C) (Oral)  Resp 22  Ht '5\' 4"'  (1.626 m)  Wt 63.186 kg (139 lb 4.8 oz)  BMI 23.90 kg/m2  SpO2 96%  Intake/Output Summary (Last 24 hours) at 07/20/14 0954 Last data filed at 07/20/14 0700  Gross per 24 hour  Intake   1700 ml  Output   1160 ml  Net    540 ml   Intake/Output: I/O last 3 completed shifts: In: 2520 [P.O.:720; I.V.:1800] Out: 1460 [Urine:1460]  Intake/Output this shift:    Weight change:  Gen:WD WN WF in NAD CVS:no rub Resp:cta IWP:YKDXIP Ext:tr edema, swollen fingers with shiny tips   Recent Labs Lab 07/17/14 1625 07/18/14 0053 07/18/14 0430 07/18/14 1605 07/19/14 0300 07/19/14 1620 07/20/14 0223  NA 129*  --  131* 131* 129* 127* 123*  K 3.4*  --  3.0* 4.1 3.6 3.5 3.3*  CL 90*  --  93* 95* 90* 85* 81*  CO2 25  --  '25 25 27 28 29  ' GLUCOSE 125*  --  119* 142* 101* 128* 106*  BUN 53*  --  55* 57* 60* 59* 65*  CREATININE 3.78* 4.36* 4.23* 4.62* 5.06* 5.33* 5.47*  ALBUMIN 3.9  --  3.5 3.5 3.3* 3.3* 3.2*  CALCIUM 8.9  --  8.5* 8.5* 7.9* 7.8* 7.7*  PHOS  --   --  6.1* 6.1* 6.6* 6.6* 7.0*  AST 33  --  27  --   --   --   --   ALT 19  --  17  --   --   --   --    Liver Function Tests:  Recent Labs Lab 07/17/14 1625 07/18/14 0430  07/19/14 0300 07/19/14 1620 07/20/14 0223  AST 33 27  --   --   --   --   ALT 19 17  --   --   --   --   ALKPHOS 51 45  --   --   --   --   BILITOT 1.1 1.0  --   --   --   --   PROT 6.1* 5.6*  --   --   --   --   ALBUMIN 3.9 3.5  < > 3.3* 3.3* 3.2*  < > = values in this interval not displayed. No results for input(s): LIPASE, AMYLASE in the last 168 hours. No results for input(s): AMMONIA in the last 168 hours. CBC:  Recent Labs Lab 07/17/14 1625 07/18/14 0053 07/18/14 0430  WBC 10.4 12.4* 12.8*  NEUTROABS 9.0*  --  9.9*  HGB 9.4* 9.0* 8.9*  HCT 28.4* 26.8* 26.6*   MCV 91.0 90.2 91.1  PLT 175 163 174   Cardiac Enzymes:  Recent Labs Lab 07/18/14 0053 07/18/14 0430 07/18/14 1104  TROPONINI 0.07* 0.06*  0.06* 0.09*   CBG: No results for input(s): GLUCAP in the last 168 hours.  Iron Studies:  Recent Labs  07/17/14 2244  IRON 42  TIBC 290  FERRITIN 61   Studies/Results: US Renal  07/18/2014   CLINICAL DATA:  Acute renal failure.  EXAM: RENAL / URINARY TRACT ULTRASOUND COMPLETE  COMPARISON:  None.  FINDINGS: Right Kidney:  Length: 10.0 cm. Echogenicity within normal limits. No mass or hydronephrosis visualized.  Left Kidney:  Length: 9.4 cm. Echogenicity within normal limits. No mass or hydronephrosis visualized.  Bladder:  Appears normal for degree of bladder distention.  Small bilateral pleural effusions incidentally noted.  IMPRESSION: No evidence of hydronephrosis or other renal abnormality.  Small bilateral pleural effusions incidentally noted.   Electronically Signed   By: Earle Gell M.D.   On: 07/18/2014 11:55   . captopril  12.5 mg Oral 4 times per day  . hydrALAZINE  50 mg Oral 3 times per day  . predniSONE  20 mg Oral QAC breakfast  . sodium chloride  3 mL Intravenous Q12H    BMET    Component Value Date/Time   NA 123* 07/20/2014 0223   K 3.3* 07/20/2014 0223   CL 81* 07/20/2014 0223   CO2 29 07/20/2014 0223   GLUCOSE 106* 07/20/2014 0223   BUN 65* 07/20/2014 0223   CREATININE 5.47* 07/20/2014 0223   CALCIUM 7.7* 07/20/2014 0223   GFRNONAA 8* 07/20/2014 0223   GFRAA 9* 07/20/2014 0223   CBC    Component Value Date/Time   WBC 12.8* 07/18/2014 0430   RBC 2.92* 07/18/2014 0430   RBC 3.02* 07/17/2014 2244   HGB 8.9* 07/18/2014 0430   HCT 26.6* 07/18/2014 0430   PLT 174 07/18/2014 0430   MCV 91.1 07/18/2014 0430   MCH 30.5 07/18/2014 0430   MCHC 33.5 07/18/2014 0430   RDW 18.4* 07/18/2014 0430   LYMPHSABS 0.9 07/18/2014 0430   MONOABS 2.0* 07/18/2014 0430   EOSABS 0.0 07/18/2014 0430   BASOSABS 0.0 07/18/2014  0430     Assessment/Plan: 1. ARF- following methotrexate therapy and accelerated hypertension. +microscopic hematuria and some proteinuria. DDx is methotrexate toxicity, acute GN, scleroderma renal crisis, as well as renal artery stenosis. (she had rheumatologic workup in May and had a + ANA but negative SSA/SSB and antiscleroderma ab) 1. Serologies ordered and pending although normal C3, C4, ESR 2. Renal US without obstruction and waiting on duplex to r/o RAS 3. isotonic bicarb to alkalinize urine (which would help with mtx toxicity) 4. Increased captopril from 6.69m q6 hours to 12.536mwith some improvement in BP but Scr increasing.  Will increase captopril to 25 q6 and follow. (for possible scleroderma renal crisis) 5. No indication for dialysis at this time 6. May require renal biopsy if renal function continues to worsen and/or serologies lean toward acute GN. 2. Hypertensive emergency- secondary HTN workup underway 1. Captopril as above for suspected scleroderma renal crisis with some improvement 2. Follow BP and Scr closely 3. Anemia- new onset, guaiac stools and check iron stores. Serologies for acute GN ordered 1. Iron deficient- IV Feraheme [id number] ok with primary svc. 4. Leukocytosis- w/u underway 5. Hyponatremia- due to ARF, will stop IVF's and recheck later today. 6. Hypokalemia- replete and follow after initiation of captopril 7. Rheumatoid arthritis- mtx on hold  Hiyab Nhem A

## 2014-07-20 NOTE — Progress Notes (Signed)
Bilateral renal artery duplex completed:  No evidence of renal artery stenosis.  Right:  Abnormal intrarenal resistive index.  Left:  Normal intrarenal resistive index.

## 2014-07-21 LAB — RENAL FUNCTION PANEL
ANION GAP: 14 (ref 5–15)
Albumin: 3.1 g/dL — ABNORMAL LOW (ref 3.5–5.0)
Albumin: 3.1 g/dL — ABNORMAL LOW (ref 3.5–5.0)
Anion gap: 13 (ref 5–15)
BUN: 68 mg/dL — ABNORMAL HIGH (ref 6–20)
BUN: 69 mg/dL — AB (ref 6–20)
CALCIUM: 8 mg/dL — AB (ref 8.9–10.3)
CO2: 30 mmol/L (ref 22–32)
CO2: 27 mmol/L (ref 22–32)
CREATININE: 5.7 mg/dL — AB (ref 0.44–1.00)
Calcium: 7.6 mg/dL — ABNORMAL LOW (ref 8.9–10.3)
Chloride: 80 mmol/L — ABNORMAL LOW (ref 101–111)
Chloride: 79 mmol/L — ABNORMAL LOW (ref 101–111)
Creatinine, Ser: 5.74 mg/dL — ABNORMAL HIGH (ref 0.44–1.00)
GFR calc Af Amer: 8 mL/min — ABNORMAL LOW (ref >60)
GFR calc non Af Amer: 7 mL/min — ABNORMAL LOW (ref >60)
GFR, EST AFRICAN AMERICAN: 9 mL/min — AB (ref >60)
GFR, EST NON AFRICAN AMERICAN: 7 mL/min — AB (ref >60)
GLUCOSE: 96 mg/dL (ref 65–99)
Glucose, Bld: 104 mg/dL — ABNORMAL HIGH (ref 65–99)
PHOSPHORUS: 6.3 mg/dL — AB (ref 2.5–4.6)
Phosphorus: 6.6 mg/dL — ABNORMAL HIGH (ref 2.5–4.6)
Potassium: 2.5 mmol/L — CL (ref 3.5–5.1)
Potassium: 3.2 mmol/L — ABNORMAL LOW (ref 3.5–5.1)
SODIUM: 123 mmol/L — AB (ref 135–145)
SODIUM: 120 mmol/L — AB (ref 135–145)

## 2014-07-21 LAB — CATECHOLAMINES, FRACTIONATED, PLASMA
Dopamine: 44 pg/mL (ref 0–48)
Epinephrine: 101 pg/mL — ABNORMAL HIGH (ref 0–62)
Norepinephrine: 692 pg/mL (ref 0–874)

## 2014-07-21 MED ORDER — POTASSIUM CHLORIDE CRYS ER 20 MEQ PO TBCR
20.0000 meq | EXTENDED_RELEASE_TABLET | Freq: Once | ORAL | Status: AC
  Administered 2014-07-21: 20 meq via ORAL

## 2014-07-21 MED ORDER — POTASSIUM CHLORIDE CRYS ER 20 MEQ PO TBCR
40.0000 meq | EXTENDED_RELEASE_TABLET | Freq: Once | ORAL | Status: DC
  Filled 2014-07-21: qty 2

## 2014-07-21 MED ORDER — CLONIDINE HCL 0.1 MG PO TABS
0.1000 mg | ORAL_TABLET | Freq: Three times a day (TID) | ORAL | Status: DC
  Administered 2014-07-21 – 2014-07-22 (×4): 0.1 mg via ORAL
  Filled 2014-07-21 (×6): qty 1

## 2014-07-21 MED ORDER — POTASSIUM CHLORIDE 10 MEQ/100ML IV SOLN
10.0000 meq | INTRAVENOUS | Status: AC
  Administered 2014-07-21 (×3): 10 meq via INTRAVENOUS
  Filled 2014-07-21 (×2): qty 100

## 2014-07-21 MED ORDER — AMLODIPINE BESYLATE 10 MG PO TABS
10.0000 mg | ORAL_TABLET | Freq: Every day | ORAL | Status: DC
  Administered 2014-07-21 – 2014-07-23 (×3): 10 mg via ORAL
  Filled 2014-07-21 (×4): qty 1

## 2014-07-21 MED ORDER — POTASSIUM CHLORIDE CRYS ER 20 MEQ PO TBCR: 40.0000 meq | EXTENDED_RELEASE_TABLET | Freq: Once | ORAL | Status: DC

## 2014-07-21 MED ORDER — FUROSEMIDE 10 MG/ML IJ SOLN
40.0000 mg | Freq: Once | INTRAMUSCULAR | Status: AC
  Administered 2014-07-21: 40 mg via INTRAVENOUS
  Filled 2014-07-21: qty 4

## 2014-07-21 MED ORDER — HYDRALAZINE HCL 20 MG/ML IJ SOLN
10.0000 mg | INTRAMUSCULAR | Status: DC | PRN
  Administered 2014-07-22 – 2014-08-09 (×9): 10 mg via INTRAVENOUS
  Filled 2014-07-21 (×10): qty 1

## 2014-07-21 NOTE — Progress Notes (Signed)
Patient asked if she could get some sleep tonight. Sign posted on door to call RN before entering. "I am so tired and these monitors alarming are waking me up every 5 minutes." Readjusted alarm parameters and turned the volume down as low as it would go. Will make sure lab does not disturb patient until 5:30am. Patient very appreciative. Will continue to monitor b/p.

## 2014-07-21 NOTE — Progress Notes (Signed)
[place] TEAM 1 - Stepdown/ICU TEAM Progress Note  Erin Morgan [id number] DOB: 1955/05/16 DOA: 07/17/2014 PCP: Nicola Girt, DO  Admit HPI / Brief Narrative: 59 y.o. female w/ a history of RA and osteopenia who presented with 3-4 months of edema and generalized joint pain.  Patient was diagnosed with RA in May and was tx w/ prednisone initially and then was started on methotrexate. Due to rising Cr and anemia the methotrexate was stopped with last dose being almost 2 weeks prior to her admit.  Her Rheumatologist started her on HCTZ to help with swelling. She had a headache off and on for few weeks which became constant. She presented to her PCP and had blood work showing WBC of 15 and was instructed to come to ER.   In the ED CT of the head was unremarkable. CR was up to 3.78, HG 9.4 WBC 10.4  Of note 2 weeks prior she traveled to [place] where she had fell and broke her right arm. She was seen by Orthopedics and is wearing a splint.   HPI/Subjective: The patient's blood pressure spiked again this morning and her headache also returned to a significant degree.  She denies chest pain shortness of breath nausea vomiting or abdominal pain.  She has not been sleeping well and is quite tired.  Assessment/Plan:  Acute renal failure  -MCTD workup in progress but serologies thus far nondiagnostic -Holding all nephrotoxic meds -Nephrology following - creatinine is climbing but otherwise there is no indication for acute hemodialysis as of yet -No clear evidence of renal artery stenosis per ultrasound but nephrology suggest there are some findings that may require further evaluation, possibly with an MRA  Rheumatoid arthritis  -Continue prednisone 20 mg daily - holding MTX - appears reasonably quiescent at present  Normocytic anemia -Iron studies suggest iron deficiency - dosed with Feraheme 07/20/14  Hyponatremia -Due to acute renal failure - Nephrology dosing with  Lasix  Hypokalemia -Unreliable oral replacement due to nausea and inability to take pills - IV runs being administered - recheck at noon  Hypertensive urgency  -Captopril QID per Nephrology for ?Scleroderma renal crisis without lasting effect -Nephrology remains somewhat suspicious of RAS -Patient had been on hydralazine prior to admission > anti-histone antibody negative -Echocardiogram notes no impairment in systolic fxn or WMA -Nephrology adjusting medical therapy today - follow  Persistent waxing/waning HA  -Started ~2 weeks ago - CT and MRI of head both w/o acute findings  -Most likely related to HTN - attempt to avoid narcotics to avoid rebound HA - trial of fioricet - trial of compazine - control of blood pressure appears to be most closely related to improvement of symptoms  Code Status: FULL Family Communication: mother and father present at time of exam  Disposition Plan: SDU due to potential need for IV antihypertensives drip  Consultants: Nephrology  Procedures: 7/14 TTE - EF 55-60 % - no regional wall motion abnormalities 7/14 - renal ultrasound without hydronephrosis  Antibiotics: None  DVT prophylaxis: SCD  Objective: Blood pressure 141/65, pulse 94, temperature 97.6 F (36.4 C), temperature source Oral, resp. rate 16, height 5\' 4"  (1.626 m), weight 63.186 kg (139 lb 4.8 oz), SpO2 93 %.  Intake/Output Summary (Last 24 hours) at 07/21/14 0939 Last data filed at 07/20/14 2000  Gross per 24 hour  Intake    720 ml  Output    800 ml  Net    -80 ml   Exam: General: No acute respiratory distress  Lungs: Clear to auscultation bilaterally without crackles Cardiovascular: Regular rate and rhythm without murmur gallop rub  Abdomen: Nontender, nondistended, soft, bowel sounds positive, no rebound, no ascites, no appreciable mass Extremities: No significant cyanosis, or clubbing; trace edema bilateral lower extremities  Data Reviewed: Basic Metabolic  Panel:  Recent Labs Lab 07/18/14 0430  07/19/14 0300 07/19/14 1620 07/20/14 0223 07/20/14 1714 07/21/14 0658  NA 131*  < > 129* 127* 123* 123* 123*  K 3.0*  < > 3.6 3.5 3.3* 3.5 2.5*  CL 93*  < > 90* 85* 81* 80* 80*  CO2 25  < > 27 28 29 31 30   GLUCOSE 119*  < > 101* 128* 106* 126* 96  BUN 55*  < > 60* 59* 65* 65* 68*  CREATININE 4.23*  < > 5.06* 5.33* 5.47* 5.48* 5.70*  CALCIUM 8.5*  < > 7.9* 7.8* 7.7* 7.9* 8.0*  MG 2.4  --   --   --   --   --   --   PHOS 6.1*  < > 6.6* 6.6* 7.0* 6.8* 6.6*  < > = values in this interval not displayed. Liver Function Tests:  Recent Labs Lab 07/17/14 1625 07/18/14 0430  07/19/14 0300 07/19/14 1620 07/20/14 0223 07/20/14 1714 07/21/14 0658  AST 33 27  --   --   --   --   --   --   ALT 19 17  --   --   --   --   --   --   ALKPHOS 51 45  --   --   --   --   --   --   BILITOT 1.1 1.0  --   --   --   --   --   --   PROT 6.1* 5.6*  --   --   --   --   --   --   ALBUMIN 3.9 3.5  < > 3.3* 3.3* 3.2* 3.2* 3.1*  < > = values in this interval not displayed. CBC:  Recent Labs Lab 07/17/14 1625 07/18/14 0053 07/18/14 0430  WBC 10.4 12.4* 12.8*  NEUTROABS 9.0*  --  9.9*  HGB 9.4* 9.0* 8.9*  HCT 28.4* 26.8* 26.6*  MCV 91.0 90.2 91.1  PLT 175 163 174   Cardiac Enzymes:  Recent Labs Lab 07/18/14 0053 07/18/14 0430 07/18/14 1104  TROPONINI 0.07* 0.06*  0.06* 0.09*    Recent Results (from the past 240 hour(s))  Urine culture     Status: None   Collection Time: 07/17/14  9:15 PM  Result Value Ref Range Status   Specimen Description URINE, CLEAN CATCH  Final   Special Requests NONE  Final   Culture   Final    MULTIPLE SPECIES PRESENT, SUGGEST RECOLLECTION IF CLINICALLY INDICATED   Report Status 07/19/2014 FINAL  Final  MRSA PCR Screening     Status: None   Collection Time: 07/17/14 11:09 PM  Result Value Ref Range Status   MRSA by PCR NEGATIVE NEGATIVE Final    Comment:        The GeneXpert MRSA Assay (FDA approved for NASAL  specimens only), is one component of a comprehensive MRSA colonization surveillance program. It is not intended to diagnose MRSA infection nor to guide or monitor treatment for MRSA infections.      Studies:  Recent x-ray studies have been reviewed in detail by the Attending Physician  Scheduled Meds:  Scheduled Meds: . amLODipine  10 mg Oral Daily  .  cloNIDine  0.1 mg Oral TID  . furosemide  40 mg Intravenous Once  . hydrALAZINE  50 mg Oral 3 times per day  . potassium chloride  10 mEq Intravenous Q1 Hr x 3  . potassium chloride  20 mEq Oral Once  . predniSONE  20 mg Oral QAC breakfast  . sodium chloride  3 mL Intravenous Q12H    Time spent on care of this patient: 25 mins  Cherene Altes, MD [facility] For Consults/Admissions - Flow Manager - [phone] Office  [phone]  Contact MD directly via text page:      [email or web address]      password [facility]  07/21/2014, 9:39 AM   LOS: 4 days

## 2014-07-21 NOTE — Progress Notes (Signed)
Patient ID: Erin Morgan, female   DOB: 10-03-1955, 59 y.o.   MRN: [id number] S:feels bad today, had severe HA's and surges in BP last night O:BP 151/67 mmHg  Pulse 94  Temp(Src) 97.6 F (36.4 C) (Oral)  Resp 16  Ht '5\' 4"'  (1.626 m)  Wt 63.186 kg (139 lb 4.8 oz)  BMI 23.90 kg/m2  SpO2 93%  Intake/Output Summary (Last 24 hours) at 07/21/14 0840 Last data filed at 07/20/14 2000  Gross per 24 hour  Intake    720 ml  Output    800 ml  Net    -80 ml   Intake/Output: I/O last 3 completed shifts: In: 1320 [P.O.:200; I.V.:1120] Out: 1460 [Urine:1460]  Intake/Output this shift:    Weight change:  Gen:WD WF in mild distress CVS:no rub Resp:decreased BS at bases POE:UMPNTI RWE:RXVQM presacral edema, +edema of hands/fingers   Recent Labs Lab 07/17/14 1625  07/18/14 0430 07/18/14 1605 07/19/14 0300 07/19/14 1620 07/20/14 0223 07/20/14 1714 07/21/14 0658  NA 129*  --  131* 131* 129* 127* 123* 123* 123*  K 3.4*  --  3.0* 4.1 3.6 3.5 3.3* 3.5 2.5*  CL 90*  --  93* 95* 90* 85* 81* 80* 80*  CO2 25  --  '25 25 27 28 29 31 30  ' GLUCOSE 125*  --  119* 142* 101* 128* 106* 126* 96  BUN 53*  --  55* 57* 60* 59* 65* 65* 68*  CREATININE 3.78*  < > 4.23* 4.62* 5.06* 5.33* 5.47* 5.48* 5.70*  ALBUMIN 3.9  --  3.5 3.5 3.3* 3.3* 3.2* 3.2* 3.1*  CALCIUM 8.9  --  8.5* 8.5* 7.9* 7.8* 7.7* 7.9* 8.0*  PHOS  --   --  6.1* 6.1* 6.6* 6.6* 7.0* 6.8* 6.6*  AST 33  --  27  --   --   --   --   --   --   ALT 19  --  17  --   --   --   --   --   --   < > = values in this interval not displayed. Liver Function Tests:  Recent Labs Lab 07/17/14 1625 07/18/14 0430  07/20/14 0223 07/20/14 1714 07/21/14 0658  AST 33 27  --   --   --   --   ALT 19 17  --   --   --   --   ALKPHOS 51 45  --   --   --   --   BILITOT 1.1 1.0  --   --   --   --   PROT 6.1* 5.6*  --   --   --   --   ALBUMIN 3.9 3.5  < > 3.2* 3.2* 3.1*  < > = values in this interval not displayed. No results for input(s): LIPASE, AMYLASE in  the last 168 hours. No results for input(s): AMMONIA in the last 168 hours. CBC:  Recent Labs Lab 07/17/14 1625 07/18/14 0053 07/18/14 0430  WBC 10.4 12.4* 12.8*  NEUTROABS 9.0*  --  9.9*  HGB 9.4* 9.0* 8.9*  HCT 28.4* 26.8* 26.6*  MCV 91.0 90.2 91.1  PLT 175 163 174   Cardiac Enzymes:  Recent Labs Lab 07/18/14 0053 07/18/14 0430 07/18/14 1104  TROPONINI 0.07* 0.06*  0.06* 0.09*   CBG: No results for input(s): GLUCAP in the last 168 hours.  Iron Studies: No results for input(s): IRON, TIBC, TRANSFERRIN, FERRITIN in the last 72 hours. Studies/Results: No results found. Marland Kitchen  captopril  25 mg Oral 4 times per day  . hydrALAZINE  50 mg Oral 3 times per day  . potassium chloride  40 mEq Oral Once  . potassium chloride  40 mEq Oral Once  . predniSONE  20 mg Oral QAC breakfast  . sodium chloride  3 mL Intravenous Q12H    BMET    Component Value Date/Time   NA 123* 07/21/2014 0658   K 2.5* 07/21/2014 0658   CL 80* 07/21/2014 0658   CO2 30 07/21/2014 0658   GLUCOSE 96 07/21/2014 0658   BUN 68* 07/21/2014 0658   CREATININE 5.70* 07/21/2014 0658   CALCIUM 8.0* 07/21/2014 0658   GFRNONAA 7* 07/21/2014 0658   GFRAA 9* 07/21/2014 0658   CBC    Component Value Date/Time   WBC 12.8* 07/18/2014 0430   RBC 2.92* 07/18/2014 0430   RBC 3.02* 07/17/2014 2244   HGB 8.9* 07/18/2014 0430   HCT 26.6* 07/18/2014 0430   PLT 174 07/18/2014 0430   MCV 91.1 07/18/2014 0430   MCH 30.5 07/18/2014 0430   MCHC 33.5 07/18/2014 0430   RDW 18.4* 07/18/2014 0430   LYMPHSABS 0.9 07/18/2014 0430   MONOABS 2.0* 07/18/2014 0430   EOSABS 0.0 07/18/2014 0430   BASOSABS 0.0 07/18/2014 0430     Assessment/Plan: 1. ARF- following methotrexate therapy and accelerated hypertension. +microscopic hematuria and some proteinuria. DDx is methotrexate toxicity, acute GN, scleroderma renal crisis, as well as renal artery stenosis. (she had rheumatologic workup in May and had a + ANA but negative  SSA/SSB and antiscleroderma ab) 1. Serologies ordered and pending although normal C3, C4, ESR 2. Renal US without obstruction and duplex to r/o RAS with increased velocity mid right renal artery and increased intrinsic resistive index of right kidney .   1. Consider noncontrasted MRA to further evaluate. 3. Increased cto 25 q6 without improvement of BP or Scr.  Initially concerning for scleroderma renal crisis however renin level still not back.  In light of worsening BP and renal function will stop captopril and change meds to clonidine and amlodipine for BP control 4. No indication for dialysis at this time 5. Recommend renal biopsy since renal function continues to worsen  2. Hypertensive emergency- secondary HTN workup underway 1. Captopril as above for suspected scleroderma renal crisis without improvement so will d/c captopril and change medicines to clonidine and amlodpine. 2. Follow BP and Scr closely 3. Anemia- new onset, guaiac stools and check iron stores. Serologies for acute GN ordered 1. Iron deficient- IV Feraheme 543m if ok with primary svc. 4. Leukocytosis- w/u underway 5. Hyponatremia- due to ARF.  Uosm inappropriately high with high urine Na.  Will add lasix and follow. 6. Hypokalemia- replete and waiting on aldosterone levels.  May need spironalactone.   7. Rheumatoid arthritis- mtx on hold  Taeden Geller A

## 2014-07-21 NOTE — Progress Notes (Signed)
Pt suffering extreme headache and nausea. Unable to take PO potassium. Morphine and zofran given as ordered. Dr Thereasa Solo aware.

## 2014-07-22 ENCOUNTER — Inpatient Hospital Stay (HOSPITAL_COMMUNITY): Payer: BC Managed Care – PPO

## 2014-07-22 LAB — RENAL FUNCTION PANEL
ALBUMIN: 3.3 g/dL — AB (ref 3.5–5.0)
ANION GAP: 15 (ref 5–15)
BUN: 68 mg/dL — ABNORMAL HIGH (ref 6–20)
CO2: 26 mmol/L (ref 22–32)
CREATININE: 6.1 mg/dL — AB (ref 0.44–1.00)
Calcium: 8.2 mg/dL — ABNORMAL LOW (ref 8.9–10.3)
Chloride: 81 mmol/L — ABNORMAL LOW (ref 101–111)
GFR calc Af Amer: 8 mL/min — ABNORMAL LOW (ref >60)
GFR calc non Af Amer: 7 mL/min — ABNORMAL LOW (ref >60)
Glucose, Bld: 97 mg/dL (ref 65–99)
Phosphorus: 7.5 mg/dL — ABNORMAL HIGH (ref 2.5–4.6)
Potassium: 3.6 mmol/L (ref 3.5–5.1)
SODIUM: 122 mmol/L — AB (ref 135–145)

## 2014-07-22 LAB — GLUCOSE, CAPILLARY: GLUCOSE-CAPILLARY: 131 mg/dL — AB (ref 65–99)

## 2014-07-22 LAB — ALDOSTERONE + RENIN ACTIVITY W/ RATIO
ALDO / PRA Ratio: 1.8 (ref 0.0–30.0)
ALDOSTERONE: 51.1 ng/dL — AB (ref 0.0–30.0)
PRA LC/MS/MS: 27.63 ng/mL/hr

## 2014-07-22 LAB — APTT: APTT: 28 s (ref 24–37)

## 2014-07-22 LAB — PROTIME-INR
INR: 1.01 (ref 0.00–1.49)
PROTHROMBIN TIME: 13.5 s (ref 11.6–15.2)

## 2014-07-22 LAB — PLATELET FUNCTION ASSAY: COLLAGEN / EPINEPHRINE: 181 s (ref 0–193)

## 2014-07-22 MED ORDER — CLONIDINE HCL 0.2 MG PO TABS
0.2000 mg | ORAL_TABLET | Freq: Three times a day (TID) | ORAL | Status: DC
Start: 2014-07-22 — End: 2014-07-24
  Administered 2014-07-22 – 2014-07-23 (×5): 0.2 mg via ORAL
  Filled 2014-07-22 (×9): qty 1

## 2014-07-22 MED ORDER — FUROSEMIDE 80 MG PO TABS
80.0000 mg | ORAL_TABLET | Freq: Two times a day (BID) | ORAL | Status: DC
Start: 2014-07-22 — End: 2014-07-24
  Administered 2014-07-22 – 2014-07-23 (×3): 80 mg via ORAL
  Filled 2014-07-22 (×8): qty 1

## 2014-07-22 NOTE — Progress Notes (Signed)
Pt bought to MRI for workup of MRA of abdomen for renal artery stenosis.  Upon obtaining images, they were compromised by artifact.  Upon questioning pt, she informed us she had received an IV of Feraheme yesterday.  This product affects MRI adversely and any evaluation of renal arteries is not possible at this time.  Effects last as long a 6 weeks.  Erin Morgan was paged with this information.

## 2014-07-22 NOTE — Progress Notes (Signed)
Erin TEAM 1 - Stepdown/ICU TEAM Progress Note  Erin Morgan [id number] DOB: 02-Sep-1955 DOA: 07/17/2014 PCP: Erin Morgan  Admit HPI / Brief Narrative: 59 y.o. female w/ a history of RA and osteopenia who presented with 3-4 months of edema and generalized joint pain.  Patient was diagnosed with RA in May and was tx w/ prednisone initially and then was started on methotrexate. Due to rising Cr and anemia the methotrexate was stopped with last dose being almost 2 weeks prior to her admit.  Her Rheumatologist started her on HCTZ to help with swelling. She had a headache off and on for few weeks which became constant. She presented to her PCP and had blood work showing WBC of 15 and was instructed to come to ER.   In the ED CT of the head was unremarkable. CR was up to 3.78, HG 9.4 WBC 10.4  Of note 2 weeks prior she traveled to [place] where she had fell and broke her right arm. She was seen by Orthopedics and is wearing a splint.   HPI/Subjective: The patient is feeling better in general today.  She states her headache has not resolved but has significantly improved in severity.  She denies chest pain shortness of breath nausea or vomiting.  She is becoming quite frustrated with the length of her hospital stay.  Assessment/Plan:  Acute renal failure  -MCTD workup in progress but serologies thus far nondiagnostic -Holding all nephrotoxic meds -Nephrology following - creatinine is still climbing but otherwise there is no indication for acute hemodialysis as of yet -No clear evidence of renal artery stenosis per ultrasound but nephrology suggests there are some findings that may require further evaluation, possibly with an MRA -Nephrology is directing care of this issue  Rheumatoid arthritis  -Continue prednisone 20 mg daily - holding MTX - appears reasonably quiescent at present - no focal joint complaints  Normocytic anemia -Iron studies suggest iron deficiency - dosed with  Feraheme 07/20/14 - hemoglobin has been stable with no evidence of blood loss  Hyponatremia -Due to acute renal failure - Nephrology dosing with Lasix - sodium is now improving  Hypokalemia -Potassium has improved to safe range - avoid further replacement in the setting of worsening renal failure  Hypertensive urgency  -Captopril QID per Nephrology for ?Scleroderma renal crisis without effect -Nephrology remains somewhat suspicious of RAS -Patient had been on hydralazine prior to admission > anti-histone antibody negative -Echocardiogram notes no impairment in systolic fxn or WMA -Nephrology adjusted medical therapy 7/18 - blood pressure control significantly improved - gently adjust regimen and continue to follow closely  Persistent waxing/waning HA  -Started ~2 weeks ago - CT and MRI of head both w/o acute findings  -Symptoms now appear to be closely linked to HTN - attempt to avoid narcotics to avoid rebound HA - trial of fioricet - control of blood pressure appears to be most closely related to improvement of symptoms  Code Status: FULL Family Communication: mother and father present at time of exam  Disposition Plan: Clear for transfer to renal bed - progress mobility - workup her renal failure per nephrology  Consultants: Nephrology  Procedures: 7/14 TTE - EF 55-60 % - no regional wall motion abnormalities 7/14 - renal ultrasound without hydronephrosis  Antibiotics: None  DVT prophylaxis: SCD  Objective: Blood pressure 150/71, pulse 75, temperature 97.9 F (36.6 C), temperature source Oral, resp. rate 18, height 5\' 4"  (1.626 m), weight 63.186 kg (139 lb 4.8 oz), SpO2 94 %.  Intake/Output Summary (Last 24 hours) at 07/22/14 0858 Last data filed at 07/22/14 0300  Gross per 24 hour  Intake    380 ml  Output   1600 ml  Net  -1220 ml   Exam: General: No acute respiratory distress Lungs: Clear to auscultation bilaterally without crackles or wheezing Cardiovascular:  Regular rate and rhythm without murmur gallop rub  Abdomen: Nontender, nondistended, soft, bowel sounds positive, no rebound, no ascites, no appreciable mass Extremities: No significant cyanosis, or clubbing; trace edema bilateral lower extremities without change  Data Reviewed: Basic Metabolic Panel:  Recent Labs Lab 07/18/14 0430  07/20/14 0223 07/20/14 1714 07/21/14 0658 07/21/14 1300 07/22/14 0330  NA 131*  < > 123* 123* 123* 120* 122*  K 3.0*  < > 3.3* 3.5 2.5* 3.2* 3.6  CL 93*  < > 81* 80* 80* 79* 81*  CO2 25  < > 29 31 30 27 26   GLUCOSE 119*  < > 106* 126* 96 104* 97  BUN 55*  < > 65* 65* 68* 69* 68*  CREATININE 4.23*  < > 5.47* 5.48* 5.70* 5.74* 6.10*  CALCIUM 8.5*  < > 7.7* 7.9* 8.0* 7.6* 8.2*  MG 2.4  --   --   --   --   --   --   PHOS 6.1*  < > 7.0* 6.8* 6.6* 6.3* 7.5*  < > = values in this interval not displayed.   Liver Function Tests:  Recent Labs Lab 07/17/14 1625 07/18/14 0430  07/20/14 0223 07/20/14 1714 07/21/14 0658 07/21/14 1300 07/22/14 0330  AST 33 27  --   --   --   --   --   --   ALT 19 17  --   --   --   --   --   --   ALKPHOS 51 45  --   --   --   --   --   --   BILITOT 1.1 1.0  --   --   --   --   --   --   PROT 6.1* 5.6*  --   --   --   --   --   --   ALBUMIN 3.9 3.5  < > 3.2* 3.2* 3.1* 3.1* 3.3*  < > = values in this interval not displayed.   CBC:  Recent Labs Lab 07/17/14 1625 07/18/14 0053 07/18/14 0430  WBC 10.4 12.4* 12.8*  NEUTROABS 9.0*  --  9.9*  HGB 9.4* 9.0* 8.9*  HCT 28.4* 26.8* 26.6*  MCV 91.0 90.2 91.1  PLT 175 163 174   Cardiac Enzymes:  Recent Labs Lab 07/18/14 0053 07/18/14 0430 07/18/14 1104  TROPONINI 0.07* 0.06*  0.06* 0.09*    Recent Results (from the past 240 hour(s))  Urine culture     Status: None   Collection Time: 07/17/14  9:15 PM  Result Value Ref Range Status   Specimen Description URINE, CLEAN CATCH  Final   Special Requests NONE  Final   Culture   Final    MULTIPLE SPECIES PRESENT,  SUGGEST RECOLLECTION IF CLINICALLY INDICATED   Report Status 07/19/2014 FINAL  Final  MRSA PCR Screening     Status: None   Collection Time: 07/17/14 11:09 PM  Result Value Ref Range Status   MRSA by PCR NEGATIVE NEGATIVE Final    Comment:        The GeneXpert MRSA Assay (FDA approved for NASAL specimens only), is one component of a comprehensive  MRSA colonization surveillance program. It is not intended to diagnose MRSA infection nor to guide or monitor treatment for MRSA infections.      Studies:  Recent x-ray studies have been reviewed in detail by the Attending Physician  Scheduled Meds:  Scheduled Meds: . amLODipine  10 mg Oral Daily  . cloNIDine  0.1 mg Oral TID  . hydrALAZINE  50 mg Oral 3 times per day  . predniSONE  20 mg Oral QAC breakfast  . sodium chloride  3 mL Intravenous Q12H    Time spent on care of this patient: 35 mins  Cherene Altes, MD [facility] For Consults/Admissions - Flow Manager - [phone] Office  [phone]  Contact MD directly via text page:      [email or web address]      password [facility]  07/22/2014, 8:58 AM   LOS: 5 days

## 2014-07-22 NOTE — Progress Notes (Signed)
Pt given apresoline for elevated blood pressure, and compazine for nausea/vomiting (as per prn orders).  Dr. Thereasa Solo notified of vomiting despite every six hours zofran.  Also notified of blood pressure remaining in [id number] systolic.  Will continue to monitor pt closely.

## 2014-07-22 NOTE — Progress Notes (Signed)
Subjective: Interval History: Patient was seen and examined this morning. Patient continue to have a "swirling" headache and some associated nausea, but feels well overall.   Objective: Filed Vitals:   07/22/14 0558 07/22/14 0758 07/22/14 0800 07/22/14 0925  BP: 153/68 150/71 165/76 171/75  Pulse:  75 76   Temp:  97.9 F (36.6 C)    TempSrc:  Oral    Resp:  18 16   Height:      Weight:      SpO2:  94%     General: Vital signs reviewed.  Patient is well-developed and well-nourished, in no acute distress and cooperative with exam.  Cardiovascular: RRR, S1 normal, S2 normal, no murmurs, gallops, or rubs. Pulmonary/Chest: Clear to auscultation bilaterally, no wheezes, rales, or rhonchi. Abdominal: Soft, non-tender, non-distended, BS +. + Sacral edema.  Extremities: Trace lower extremity edema bilaterally, pulses symmetric and intact bilaterally. Skin: Warm, dry and intact. No rashes or erythema.  Intake/Output from previous day: 07/17 0701 - 07/18 0700 In: 380 [I.V.:180; IV Piggyback:200] Out: 1600 [Urine:1600]  BMET:   Recent Labs  07/21/14 1300 07/22/14 0330  NA 120* 122*  K 3.2* 3.6  CL 79* 81*  CO2 27 26  GLUCOSE 104* 97  BUN 69* 68*  CREATININE 5.74* 6.10*  CALCIUM 7.6* 8.2*   I have reviewed the patient's current medications. Prior to Admission:  Prescriptions prior to admission  Medication Sig Dispense Refill Last Dose  . CALCIUM PO Take 1 tablet by mouth daily.   Past Week at Unknown time  . Cholecalciferol 1000 UNITS capsule Take 1,000 Units by mouth daily.   Past Week at Unknown time  . estradiol (VAGIFEM) 25 MCG vaginal tablet Place 25 mcg vaginally 2 (two) times a week.    Past Week at Unknown time  . folic acid (FOLVITE) 1 MG tablet Take 1 mg by mouth daily.   07/16/2014 at Unknown time  . hydrochlorothiazide (MICROZIDE) 12.5 MG capsule Take 12.5 mg by mouth as needed (for fluid).    07/16/2014 at Unknown time  . predniSONE (DELTASONE) 5 MG tablet Take 20 mg  by mouth daily.   07/16/2014 at Unknown time  . HYDROcodone-acetaminophen (NORCO) 5-325 MG per tablet Take 1 tablet by mouth every 6 (six) hours as needed for moderate pain. 30 tablet 0    Scheduled: . amLODipine  10 mg Oral Daily  . cloNIDine  0.2 mg Oral TID  . furosemide  80 mg Oral BID  . hydrALAZINE  50 mg Oral 3 times per day  . predniSONE  20 mg Oral QAC breakfast  . sodium chloride  3 mL Intravenous Q12H   Continuous: . sodium chloride 10 mL/hr at 07/18/14 1400   [id number] **OR** acetaminophen, butalbital-acetaminophen-caffeine, hydrALAZINE, HYDROcodone-acetaminophen, morphine injection, ondansetron **OR** ondansetron (ZOFRAN) IV, prochlorperazine, senna  Assessment/Plan:  ARF: Patient with history of RA recently diagnosed and recently on methotrexate, but taken off about 2 weeks ago d/t increased SCr. Patient also with recent accelerated HTN without previous history of uncontrolled HTN. +microscopic hematuria and proteinuria. RA workup showed +ANA but negative SSA/SSB and negative anti-scleroderma ab. Renal US with duplex showed no evidence of renal artery stenosis noted bilaterally, but with abnormal intrarenal resistive index on right and normal intrarenal restive index on left. Creatinine continues to be elevated, 6.10 today. Etiology is still uncertain, but possibly secondary to scleroderma renal crisis. -Obtain noncontrasted MRA  -Plan for renal biopsy  -HOLD any aspirin or blood thinners -Will likely need dialysis in next  few days -Obtain PT/PTT/INR/Platelet function assay  Worsening GFR.  Roles of bp control, ? RAS.  Suspect some underlying primary renal process.  Needs bx   Hypertensive Emergency: Improved. Possibly secondary to scleroderma renal crisis; however, no improvement on captopril and anti-scleroderma antibody negative. Renal US showed results as above. BP now in the 130s/70s- to 160s/80s.  -Continue amlodipine 10 mg daily -Continue clonodine 0.1 mg  TID -Continue Hydralazine 50 mg TID -Lasix 80 mg po BID added  Iron Deficiency Anemia: New diagnosis. Given Fereheme on 07/20/14.  Hypervolemic Hyponatremia: Likely secondary to renal failure. Sodium 122 this morning. UOsm high at 329 with high urine Na of 41.  -Lasix 80 mg po BID -Fluid restrict -Non-contrast MRA as above to evaluated for RAS  Hypokalemia: Potassium 3.6 today. -Continue to trend renal function panel -Aldosterone/renin levels pending -Consider spironolactone based on results  RA: Holding methotrexate for now.  -Continue prednisone 20 mg daily   LOS: 5 days   Erin Craver, DO PGY-1 Internal Medicine Resident Pager # [phone] 07/22/2014 12:30 PM I have seen and examined this patient and agree with the plan of care seen , examined, eval, counseled patient.  Discussed with residents.  .  Erin Morgan 07/22/2014, 4:34 PM

## 2014-07-23 ENCOUNTER — Inpatient Hospital Stay (HOSPITAL_COMMUNITY): Payer: BC Managed Care – PPO

## 2014-07-23 LAB — CBC
HCT: 24.7 % — ABNORMAL LOW (ref 36.0–46.0)
HCT: 22.7 % — ABNORMAL LOW (ref 36.0–46.0)
HEMOGLOBIN: 7.8 g/dL — AB (ref 12.0–15.0)
Hemoglobin: 8.4 g/dL — ABNORMAL LOW (ref 12.0–15.0)
MCH: 30.7 pg (ref 26.0–34.0)
MCH: 30.2 pg (ref 26.0–34.0)
MCHC: 34 g/dL (ref 30.0–36.0)
MCHC: 34.4 g/dL (ref 30.0–36.0)
MCV: 90.1 fL (ref 78.0–100.0)
MCV: 88 fL (ref 78.0–100.0)
PLATELETS: 177 10*3/uL (ref 150–400)
PLATELETS: 164 10*3/uL (ref 150–400)
RBC: 2.74 MIL/uL — ABNORMAL LOW (ref 3.87–5.11)
RBC: 2.58 MIL/uL — AB (ref 3.87–5.11)
RDW: 17 % — ABNORMAL HIGH (ref 11.5–15.5)
RDW: 16.4 % — ABNORMAL HIGH (ref 11.5–15.5)
WBC: 9.4 10*3/uL (ref 4.0–10.5)
WBC: 8.1 10*3/uL (ref 4.0–10.5)

## 2014-07-23 LAB — RENAL FUNCTION PANEL
ANION GAP: 16 — AB (ref 5–15)
Albumin: 3.2 g/dL — ABNORMAL LOW (ref 3.5–5.0)
BUN: 73 mg/dL — AB (ref 6–20)
CO2: 25 mmol/L (ref 22–32)
Calcium: 8 mg/dL — ABNORMAL LOW (ref 8.9–10.3)
Chloride: 80 mmol/L — ABNORMAL LOW (ref 101–111)
Creatinine, Ser: 7.16 mg/dL — ABNORMAL HIGH (ref 0.44–1.00)
GFR calc Af Amer: 6 mL/min — ABNORMAL LOW (ref >60)
GFR calc non Af Amer: 6 mL/min — ABNORMAL LOW (ref >60)
Glucose, Bld: 81 mg/dL (ref 65–99)
PHOSPHORUS: 8.2 mg/dL — AB (ref 2.5–4.6)
Potassium: 3.2 mmol/L — ABNORMAL LOW (ref 3.5–5.1)
SODIUM: 121 mmol/L — AB (ref 135–145)

## 2014-07-23 LAB — CBC WITH DIFFERENTIAL/PLATELET
Basophils Absolute: 0 10*3/uL (ref 0.0–0.1)
Basophils Relative: 0 % (ref 0–1)
Eosinophils Absolute: 0.3 10*3/uL (ref 0.0–0.7)
Eosinophils Relative: 3 % (ref 0–5)
HCT: 25.1 % — ABNORMAL LOW (ref 36.0–46.0)
Hemoglobin: 8.5 g/dL — ABNORMAL LOW (ref 12.0–15.0)
LYMPHS PCT: 13 % (ref 12–46)
Lymphs Abs: 1.3 10*3/uL (ref 0.7–4.0)
MCH: 30.4 pg (ref 26.0–34.0)
MCHC: 33.9 g/dL (ref 30.0–36.0)
MCV: 89.6 fL (ref 78.0–100.0)
Monocytes Absolute: 1.5 10*3/uL — ABNORMAL HIGH (ref 0.1–1.0)
Monocytes Relative: 15 % — ABNORMAL HIGH (ref 3–12)
NEUTROS PCT: 69 % (ref 43–77)
Neutro Abs: 7.1 10*3/uL (ref 1.7–7.7)
PLATELETS: 188 10*3/uL (ref 150–400)
RBC: 2.8 MIL/uL — ABNORMAL LOW (ref 3.87–5.11)
RDW: 16.8 % — AB (ref 11.5–15.5)
WBC: 10.2 10*3/uL (ref 4.0–10.5)

## 2014-07-23 LAB — ABO/RH: ABO/RH(D): O POS

## 2014-07-23 LAB — HEPATITIS B SURFACE ANTIBODY, QUANTITATIVE

## 2014-07-23 LAB — TYPE AND SCREEN
ABO/RH(D): O POS
ANTIBODY SCREEN: NEGATIVE

## 2014-07-23 LAB — HEPATITIS B SURFACE ANTIGEN: Hepatitis B Surface Ag: NEGATIVE

## 2014-07-23 MED ORDER — POTASSIUM CHLORIDE CRYS ER 20 MEQ PO TBCR
40.0000 meq | EXTENDED_RELEASE_TABLET | Freq: Once | ORAL | Status: AC
  Administered 2014-07-23: 40 meq via ORAL
  Filled 2014-07-23: qty 2

## 2014-07-23 MED ORDER — OXYCODONE-ACETAMINOPHEN 5-325 MG PO TABS: 1.0000 | ORAL_TABLET | ORAL | Status: DC | PRN

## 2014-07-23 MED ORDER — ACETAMINOPHEN 500 MG PO TABS
500.0000 mg | ORAL_TABLET | Freq: Four times a day (QID) | ORAL | Status: DC | PRN
  Administered 2014-07-25 – 2014-08-02 (×8): 1000 mg via ORAL
  Administered 2014-08-08: 500 mg via ORAL
  Filled 2014-07-23 (×8): qty 2
  Filled 2014-07-23: qty 1
  Filled 2014-07-23: qty 2

## 2014-07-23 MED ORDER — LIDOCAINE HCL (PF) 1 % IJ SOLN
INTRAMUSCULAR | Status: AC
  Filled 2014-07-23: qty 10

## 2014-07-23 MED ORDER — LORAZEPAM 1 MG PO TABS
4.0000 mg | ORAL_TABLET | Freq: Once | ORAL | Status: AC
  Administered 2014-07-23: 4 mg via ORAL
  Filled 2014-07-23: qty 4

## 2014-07-23 MED ORDER — SODIUM CHLORIDE 0.9 % IV SOLN
INTRAVENOUS | Status: DC
Start: 2014-07-23 — End: 2014-08-09

## 2014-07-23 NOTE — Progress Notes (Signed)
Patient ID: Erin Morgan, female   DOB: 10-06-1955, 59 y.o.   MRN: [id number]  [facility] PROGRESS NOTE  Erin Morgan [id number] DOB: 1955/08/09 DOA: 07/17/2014 PCP: Nicola Girt, DO   Brief narrative:    59 y.o. female w/ a history of RA and osteopenia who presented with 3-4 months of edema and generalized joint pain. Patient was diagnosed with RA in May and was tx w/ prednisone initially and then was started on methotrexate. Due to rising Cr and anemia the methotrexate was stopped with last dose being almost 2 weeks prior to this admission.   In the ED CT of the head was unremarkable. Cr was up to 3.78, Hg 9.4 WBC 10.4 Of note 2 weeks prior she traveled to [place] where she had fell and broke her right arm. She was seen by Orthopedics and is wearing a splint.   Assessment/Plan:    Acute renal failure  - MCTD workup in progress but serologies thus far nondiagnostic - Holding all nephrotoxic meds - plan for kidney biopsy today as Cr continues trending up - management per nephrology team   Rheumatoid arthritis  - Continue prednisone 20 mg daily  - holding MTX  - appears reasonably quiescent at present  - no focal joint complaints  Normocytic anemia - Iron studies suggest iron deficiency  - dosed with Feraheme 07/20/14 - Hg remains overall stable with no signs of active bleeding  Hypervolemic Hyponatremia - Due to acute renal failure - pt has been on Lasix   Hypokalemia - still low - continue to supplement   Hypertensive urgency  - Captopril QID per Nephrology for ?Scleroderma renal crisis - Nephrology remains somewhat suspicious of RAS - Patient had been on hydralazine prior to admission > anti-histone antibody negative - Echocardiogram notes no impairment in systolic fxn or WMA - Nephrology adjusted medical therapy 7/18  - reasonable inpatient control for now   Persistent waxing/waning HA  - better this AM  DVT prophylaxis - SCD's  Code  Status: Full.  Family Communication:  plan of care discussed with the patient and family  Disposition Plan: Home when stable.   IV access:  Peripheral IV  Procedures and diagnostic studies:    Dg Shoulder Right 07/09/2014  Minimally displaced fracture of the greater tuberosity of the proximal right humerus.    Ct Head Wo Contrast 07/17/2014  Negative head CT.   Mr Brain Wo Contrast 07/18/2014  No acute intracranial process identified. 2. Few scattered subcentimeter T2/FLAIR height matter foci within the supratentorial white matter. These foci are nonspecific, but may be related to very mild chronic small vessel ischemic changes. These are felt to be within normal limits for patient age.   US Renal 07/18/2014   No evidence of hydronephrosis or other renal abnormality.    Medical Consultants:  Nephrology  Other Consultants:  None  IAnti-Infectives:   None  Faye Ramsay, MD  [facility] Pager [phone]   If 7PM-7AM, please contact night-coverage [email or web address] Password [facility] 07/23/2014, 4:51 PM   LOS: 6 days   HPI/Subjective: No events overnight.   Objective: Filed Vitals:   07/23/14 0836 07/23/14 1228 07/23/14 1236 07/23/14 1242  BP: 156/75 152/62 149/64 145/62  Pulse: 71 85 83 89  Temp: 97.7 F (36.5 C)     TempSrc: Oral     Resp: 18 18 18 18   Height:      Weight:      SpO2: 94% 94% 97% 93%    Intake/Output Summary (Last 24 hours)  at 07/23/14 1651 Last data filed at 07/23/14 1300  Gross per 24 hour  Intake    843 ml  Output      0 ml  Net    843 ml    Exam:   General:  Pt is alert, follows commands appropriately, not in acute distress  Cardiovascular: Regular rate and rhythm, no rubs, no gallops  Respiratory: Clear to auscultation bilaterally, no wheezing, no crackles, no rhonchi  Abdomen: Soft, non tender, non distended, bowel sounds present, no guarding  Extremities: Trace bilateral LE edema, pulses DP and PT palpable bilaterally  Neuro: Grossly  nonfocal  Data Reviewed: Basic Metabolic Panel:  Recent Labs Lab 07/18/14 0430  07/20/14 1714 07/21/14 0658 07/21/14 1300 07/22/14 0330 07/23/14 0506  NA 131*  < > 123* 123* 120* 122* 121*  K 3.0*  < > 3.5 2.5* 3.2* 3.6 3.2*  CL 93*  < > 80* 80* 79* 81* 80*  CO2 25  < > 31 30 27 26 25   GLUCOSE 119*  < > 126* 96 104* 97 81  BUN 55*  < > 65* 68* 69* 68* 73*  CREATININE 4.23*  < > 5.48* 5.70* 5.74* 6.10* 7.16*  CALCIUM 8.5*  < > 7.9* 8.0* 7.6* 8.2* 8.0*  MG 2.4  --   --   --   --   --   --   PHOS 6.1*  < > 6.8* 6.6* 6.3* 7.5* 8.2*  < > = values in this interval not displayed. Liver Function Tests:  Recent Labs Lab 07/17/14 1625 07/18/14 0430  07/20/14 1714 07/21/14 0658 07/21/14 1300 07/22/14 0330 07/23/14 0506  AST 33 27  --   --   --   --   --   --   ALT 19 17  --   --   --   --   --   --   ALKPHOS 51 45  --   --   --   --   --   --   BILITOT 1.1 1.0  --   --   --   --   --   --   PROT 6.1* 5.6*  --   --   --   --   --   --   ALBUMIN 3.9 3.5  < > 3.2* 3.1* 3.1* 3.3* 3.2*  < > = values in this interval not displayed.  CBC:  Recent Labs Lab 07/17/14 1625 07/18/14 0053 07/18/14 0430 07/23/14 0506 07/23/14 0756  WBC 10.4 12.4* 12.8* 9.4 10.2  NEUTROABS 9.0*  --  9.9*  --  7.1  HGB 9.4* 9.0* 8.9* 8.4* 8.5*  HCT 28.4* 26.8* 26.6* 24.7* 25.1*  MCV 91.0 90.2 91.1 90.1 89.6  PLT 175 163 174 177 188   Cardiac Enzymes:  Recent Labs Lab 07/18/14 0053 07/18/14 0430 07/18/14 1104  TROPONINI 0.07* 0.06*  0.06* 0.09*   BNP: Invalid input(s): POCBNP CBG:  Recent Labs Lab 07/22/14 2056  GLUCAP 131*    Recent Results (from the past 240 hour(s))  Urine culture     Status: None   Collection Time: 07/17/14  9:15 PM  Result Value Ref Range Status   Specimen Description URINE, CLEAN CATCH  Final   Special Requests NONE  Final   Culture   Final    MULTIPLE SPECIES PRESENT, SUGGEST RECOLLECTION IF CLINICALLY INDICATED   Report Status 07/19/2014 FINAL  Final   MRSA PCR Screening     Status: None   Collection  Time: 07/17/14 11:09 PM  Result Value Ref Range Status   MRSA by PCR NEGATIVE NEGATIVE Final    Comment:        The GeneXpert MRSA Assay (FDA approved for NASAL specimens only), is one component of a comprehensive MRSA colonization surveillance program. It is not intended to diagnose MRSA infection nor to guide or monitor treatment for MRSA infections.      Scheduled Meds: . amLODipine  10 mg Oral Daily  . cloNIDine  0.2 mg Oral TID  . furosemide  80 mg Oral BID  . hydrALAZINE  50 mg Oral 3 times per day  . lidocaine (PF)      . potassium chloride  40 mEq Oral Once  . predniSONE  20 mg Oral QAC breakfast  . sodium chloride  3 mL Intravenous Q12H   Continuous Infusions: . sodium chloride 10 mL/hr at 07/18/14 1400  . sodium chloride

## 2014-07-23 NOTE — Progress Notes (Signed)
PT Cancellation Note  Patient Details Name: Pang Godinho MRN: [id number] DOB: [birth year]   Cancelled Treatment:    Reason Eval/Treat Not Completed: Medical issues which prohibited therapy   RN informed me: after today's renal biopsy procedure, pt is to remain prone until 1700;   Will follow up for PT eval tomorrow;  Roney Marion, [place] Pager [phone] Office [phone]    Roney Marion [facility] 07/23/2014, 3:15 PM

## 2014-07-23 NOTE — Progress Notes (Signed)
Subjective:  Erin Morgan is a 59 yo female presenting with ARF with unknown etiology. Patient was seen and examined this morning. Unfortunately, patient was unable to have an MRA without contrast yesterday as images were skewed by artifact from [place]. Patient states headache has improved. She does admit to some nausea, fatigue and swelling in her lower extremities and arms.   Objective: Filed Vitals:   07/23/14 0836 07/23/14 1228 07/23/14 1236 07/23/14 1242  BP: 156/75 152/62 149/64 145/62  Pulse: 71 85 83 89  Temp: 97.7 F (36.5 C)     TempSrc: Oral     Resp: 18 18 18 18   Height:      Weight:      SpO2: 94% 94% 97% 93%   General: Vital signs reviewed. Patient is well-developed and well-nourished, in no acute distress and cooperative with exam.  Cardiovascular: RRR, S1 normal, S2 normal, no murmurs, gallops, or rubs. Pulmonary/Chest: Clear to auscultation bilaterally, no wheezes, rales, or rhonchi. Abdominal: Soft, non-tender, non-distended, BS +. + Sacral edema.  Extremities: Trace lower extremity edema bilaterally, pulses symmetric and intact bilaterally. Skin: Warm, dry and intact. No rashes or erythema.  Intake/Output from previous day: 07/18 0701 - 07/19 0700 In: 843 [P.O.:840; I.V.:3] Out: 450 [Urine:150; Emesis/NG output:300] Intake/Output this shift: Total I/O In: 240 [P.O.:240] Out: 0   Lab Results:  Recent Labs  07/23/14 0506 07/23/14 0756  WBC 9.4 10.2  HGB 8.4* 8.5*  HCT 24.7* 25.1*  PLT 177 188   BMET:   Recent Labs  07/22/14 0330 07/23/14 0506  NA 122* 121*  K 3.6 3.2*  CL 81* 80*  CO2 26 25  GLUCOSE 97 81  BUN 68* 73*  CREATININE 6.10* 7.16*  CALCIUM 8.2* 8.0*   CBG (last 3)   Recent Labs  07/22/14 2056  GLUCAP 131*   I have reviewed the patient's current medications. Prior to Admission:  Prescriptions prior to admission  Medication Sig Dispense Refill Last Dose  . CALCIUM PO Take 1 tablet by mouth daily.   Past Week at  Unknown time  . Cholecalciferol 1000 UNITS capsule Take 1,000 Units by mouth daily.   Past Week at Unknown time  . estradiol (VAGIFEM) 25 MCG vaginal tablet Place 25 mcg vaginally 2 (two) times a week.    Past Week at Unknown time  . folic acid (FOLVITE) 1 MG tablet Take 1 mg by mouth daily.   07/16/2014 at Unknown time  . hydrochlorothiazide (MICROZIDE) 12.5 MG capsule Take 12.5 mg by mouth as needed (for fluid).    07/16/2014 at Unknown time  . predniSONE (DELTASONE) 5 MG tablet Take 20 mg by mouth daily.   07/16/2014 at Unknown time  . HYDROcodone-acetaminophen (NORCO) 5-325 MG per tablet Take 1 tablet by mouth every 6 (six) hours as needed for moderate pain. 30 tablet 0    Scheduled: . amLODipine  10 mg Oral Daily  . cloNIDine  0.2 mg Oral TID  . furosemide  80 mg Oral BID  . hydrALAZINE  50 mg Oral 3 times per day  . lidocaine (PF)      . potassium chloride  40 mEq Oral Once  . predniSONE  20 mg Oral QAC breakfast  . sodium chloride  3 mL Intravenous Q12H   Continuous: . sodium chloride 10 mL/hr at 07/18/14 1400  . sodium chloride     [id number] **OR** acetaminophen, butalbital-acetaminophen-caffeine, hydrALAZINE, HYDROcodone-acetaminophen, morphine injection, ondansetron **OR** ondansetron (ZOFRAN) IV, prochlorperazine, senna  Assessment/Plan:   ARF: Presenting  with acutely increased creatinine which continues to rise and new onset hypertension. Patient is newly diagnosed with RA and recently started on methotrexate. Methotrexate stopped about 2 weeks ago d/t increased SCr. However, we do not believe ARF is secondary to methotrexate. Etiology still unknown but possibly secondary to scleroderma renal crisis. Patient is +ANA but negative SSA/SSB and negative anti-scleroderma ab. Renal US with duplex showed no evidence of renal artery stenosis noted bilaterally, but with abnormal intrarenal resistive index on right and normal intrarenal restive index on left. We attempted to obtain  MRA noncontrast to further evaluate, but unfortunately, images were skewed from recent [facility] administration. This was discussed with Radiology who did not recommend obtaining an unenhanced CT abd/pelvis as this would not be diagnostic for RAS, but would only show calcifications along the arteries and renal atrophy. Creatinine continues to rise 7.16 today; GFR 6. We plan on renal biopsy today.  -Renal biopsy performed today at 12:30 with success -Keep prone for 4 hours -HOLD any aspirin or blood thinners -Setting up for dialysis-discussed with Dr. Barbie Banner with IR who will preform permacath placement -Place permacath -Start HD tomorrow Etiology not clear, scleroderma vs subacute drug, vs just HTN  Hypertensive Emergency: Improved. Possibly secondary to scleroderma renal crisis; however, no improvement on captopril and anti-scleroderma antibody negative. BP now in the 130s/60s. Started on Lasix 80 mg po BID yesterday. -Continue amlodipine 10 mg daily -Continue clonodine 0.1 mg TID -Continue Hydralazine 50 mg TID -Lasix 80 mg po BID  BP much better  Iron Deficiency Anemia: New diagnosis. Given Fereheme on 07/20/14.  Hypervolemic Hyponatremia: Likely secondary to renal failure. UOsm high at 329 with high urine Na of 41.Resolve at HD  -Lasix 80 mg po BID -Fluid restrict  Hypokalemia: Potassium 3.2 today. Aldosterone level high at 51.1 which can be expected. Aldo:PRA ratio 1.8.  -Continue to trend renal function panel -Potassium replaced with KDur 40 mEq po once  RA: Holding methotrexate for now.  -Continue prednisone 20 mg daily   LOS: 6 days   Osa Craver, DO PGY-1 Internal Medicine Resident Pager # [phone] 07/23/2014 1:16 PM I have seen and examined this patient and agree with the plan of care seen, eval, examined, spent over 45 min explaining to patient and family.  HD tomorrow .  Pegah Segel L 07/23/2014, 4:55 PM

## 2014-07-23 NOTE — Procedures (Signed)
Patient in prone position.  Kidneys localized with U/S.  Prep clorohexadine, xylocaine LA.  Using U/S guidance 3 passes to obtain 2 cores of tissue.  EBL 30 cc.  Tolerated well.

## 2014-07-24 ENCOUNTER — Inpatient Hospital Stay (HOSPITAL_COMMUNITY): Payer: BC Managed Care – PPO

## 2014-07-24 DIAGNOSIS — N19 Unspecified kidney failure: Secondary | ICD-10-CM | POA: Insufficient documentation

## 2014-07-24 LAB — CBC
HEMATOCRIT: 22 % — AB (ref 36.0–46.0)
Hemoglobin: 7.5 g/dL — ABNORMAL LOW (ref 12.0–15.0)
MCH: 30 pg (ref 26.0–34.0)
MCHC: 34.1 g/dL (ref 30.0–36.0)
MCV: 88 fL (ref 78.0–100.0)
Platelets: 188 10*3/uL (ref 150–400)
RBC: 2.5 MIL/uL — AB (ref 3.87–5.11)
RDW: 16.3 % — ABNORMAL HIGH (ref 11.5–15.5)
WBC: 10.4 10*3/uL (ref 4.0–10.5)

## 2014-07-24 LAB — RENAL FUNCTION PANEL
Albumin: 3.1 g/dL — ABNORMAL LOW (ref 3.5–5.0)
Anion gap: 19 — ABNORMAL HIGH (ref 5–15)
BUN: 76 mg/dL — AB (ref 6–20)
CO2: 24 mmol/L (ref 22–32)
CREATININE: 7.59 mg/dL — AB (ref 0.44–1.00)
Calcium: 7.9 mg/dL — ABNORMAL LOW (ref 8.9–10.3)
Chloride: 77 mmol/L — ABNORMAL LOW (ref 101–111)
GFR calc Af Amer: 6 mL/min — ABNORMAL LOW (ref >60)
GFR, EST NON AFRICAN AMERICAN: 5 mL/min — AB (ref >60)
Glucose, Bld: 95 mg/dL (ref 65–99)
Phosphorus: 9.2 mg/dL — ABNORMAL HIGH (ref 2.5–4.6)
Potassium: 3.7 mmol/L (ref 3.5–5.1)
Sodium: 120 mmol/L — ABNORMAL LOW (ref 135–145)

## 2014-07-24 MED ORDER — LIDOCAINE HCL (PF) 1 % IJ SOLN: 5.0000 mL | INTRAMUSCULAR | Status: DC | PRN

## 2014-07-24 MED ORDER — CLONIDINE HCL 0.1 MG PO TABS
0.1000 mg | ORAL_TABLET | Freq: Three times a day (TID) | ORAL | Status: DC
  Administered 2014-07-24 – 2014-08-07 (×35): 0.1 mg via ORAL
  Filled 2014-07-24 (×45): qty 1

## 2014-07-24 MED ORDER — CEFAZOLIN SODIUM-DEXTROSE 2-3 GM-% IV SOLR
INTRAVENOUS | Status: AC
  Administered 2014-07-24: 2 g via INTRAVENOUS
  Filled 2014-07-24: qty 50

## 2014-07-24 MED ORDER — RENA-VITE PO TABS
1.0000 | ORAL_TABLET | Freq: Every day | ORAL | Status: DC
  Administered 2014-07-24 – 2014-08-08 (×16): 1 via ORAL
  Filled 2014-07-24 (×21): qty 1

## 2014-07-24 MED ORDER — AMLODIPINE BESYLATE 10 MG PO TABS
10.0000 mg | ORAL_TABLET | Freq: Every day | ORAL | Status: DC
  Administered 2014-07-24 – 2014-08-08 (×16): 10 mg via ORAL
  Filled 2014-07-24 (×21): qty 1

## 2014-07-24 MED ORDER — MIDAZOLAM HCL 2 MG/2ML IJ SOLN
INTRAMUSCULAR | Status: AC
Start: 2014-07-24 — End: 2014-07-25
  Filled 2014-07-24: qty 2

## 2014-07-24 MED ORDER — SODIUM CHLORIDE 0.9 % IV SOLN: 100.0000 mL | INTRAVENOUS | Status: DC | PRN

## 2014-07-24 MED ORDER — CEFAZOLIN SODIUM-DEXTROSE 2-3 GM-% IV SOLR
2.0000 g | INTRAVENOUS | Status: AC
  Administered 2014-07-24: 2 g via INTRAVENOUS
  Filled 2014-07-24: qty 50

## 2014-07-24 MED ORDER — CALCIUM ACETATE (PHOS BINDER) 667 MG PO CAPS
667.0000 mg | ORAL_CAPSULE | Freq: Three times a day (TID) | ORAL | Status: DC
  Administered 2014-07-25 – 2014-08-07 (×26): 667 mg via ORAL
  Filled 2014-07-24 (×36): qty 1

## 2014-07-24 MED ORDER — LIDOCAINE HCL 1 % IJ SOLN
INTRAMUSCULAR | Status: AC
  Filled 2014-07-24: qty 20

## 2014-07-24 MED ORDER — DARBEPOETIN ALFA 200 MCG/0.4ML IJ SOSY
200.0000 ug | PREFILLED_SYRINGE | INTRAMUSCULAR | Status: DC
  Administered 2014-07-25: 200 ug via INTRAVENOUS
  Filled 2014-07-24 (×3): qty 0.4

## 2014-07-24 MED ORDER — MIDAZOLAM HCL 2 MG/2ML IJ SOLN
INTRAMUSCULAR | Status: AC | PRN
  Administered 2014-07-24: 1 mg via INTRAVENOUS

## 2014-07-24 MED ORDER — PENTAFLUOROPROP-TETRAFLUOROETH EX AERO: 1.0000 "application " | INHALATION_SPRAY | CUTANEOUS | Status: DC | PRN

## 2014-07-24 MED ORDER — HEPARIN SODIUM (PORCINE) 1000 UNIT/ML IJ SOLN
INTRAMUSCULAR | Status: AC
  Filled 2014-07-24: qty 1

## 2014-07-24 MED ORDER — ALTEPLASE 2 MG IJ SOLR
2.0000 mg | Freq: Once | INTRAMUSCULAR | Status: DC | PRN
  Filled 2014-07-24: qty 2

## 2014-07-24 MED ORDER — LIDOCAINE-PRILOCAINE 2.5-2.5 % EX CREA
1.0000 "application " | TOPICAL_CREAM | CUTANEOUS | Status: DC | PRN
  Filled 2014-07-24: qty 5

## 2014-07-24 MED ORDER — FENTANYL CITRATE (PF) 100 MCG/2ML IJ SOLN
INTRAMUSCULAR | Status: AC
  Filled 2014-07-24: qty 2

## 2014-07-24 MED ORDER — HYDRALAZINE HCL 25 MG PO TABS
25.0000 mg | ORAL_TABLET | Freq: Three times a day (TID) | ORAL | Status: DC
  Administered 2014-07-24 – 2014-07-25 (×4): 25 mg via ORAL
  Filled 2014-07-24 (×9): qty 1

## 2014-07-24 MED ORDER — NEPRO/CARBSTEADY PO LIQD
237.0000 mL | ORAL | Status: DC | PRN
  Filled 2014-07-24: qty 237

## 2014-07-24 MED ORDER — HEPARIN SODIUM (PORCINE) 1000 UNIT/ML DIALYSIS: 1000.0000 [IU] | INTRAMUSCULAR | Status: DC | PRN

## 2014-07-24 MED ORDER — FENTANYL CITRATE (PF) 100 MCG/2ML IJ SOLN
INTRAMUSCULAR | Status: AC | PRN
  Administered 2014-07-24: 50 ug via INTRAVENOUS

## 2014-07-24 NOTE — Progress Notes (Signed)
Subjective:  Erin Morgan is a 59 yo female presenting with ARF with unknown etiology. Patient was seen and examined this morning. She went for renal biopsy yesterday and tolerated the procedure well. She continues to feel fatigued, nausea and has a headache with a "whirling" feeling. She also notes increased swelling in her legs.   Objective: Filed Vitals:   07/23/14 1740 07/23/14 2031 07/24/14 0533 07/24/14 1000  BP: 134/60 136/73 129/58 158/68  Pulse: 86 100 81 84  Temp: 98.4 F (36.9 C) 97.7 F (36.5 C) 98.5 F (36.9 C) 98.3 F (36.8 C)  TempSrc: Oral Oral Oral Oral  Resp: 18 17 18 18   Height:      Weight:  155 lb 6.4 oz (70.489 kg)    SpO2: 95% 94% 95% 96%   General: Vital signs reviewed. Patient is well-developed and well-nourished, in no acute distress and cooperative with exam.  Cardiovascular: RRR, S1 normal, S2 normal, no murmurs, gallops, or rubs.  Gr2/6 M Pulmonary/Chest: Clear to auscultation bilaterally, no wheezes, rales, or rhonchi. Abdominal: Soft, non-tender, non-distended, BS +. + Sacral edema.  Extremities: 1-2+ lower extremity pitting edema bilaterally, pulses symmetric and intact bilaterally. Skin: Warm, dry and intact. No rashes or erythema.  Intake/Output from previous day: 07/19 0701 - 07/20 0700 In: 600 [P.O.:600] Out: 950 [Urine:950] Intake/Output this shift: Total I/O In: 0  Out: 350 [Urine:350]  Lab Results:  Recent Labs  07/23/14 1943 07/24/14 0358  WBC 8.1 10.4  HGB 7.8* 7.5*  HCT 22.7* 22.0*  PLT 164 188   BMET:   Recent Labs  07/23/14 0506 07/24/14 0358  NA 121* 120*  K 3.2* 3.7  CL 80* 77*  CO2 25 24  GLUCOSE 81 95  BUN 73* 76*  CREATININE 7.16* 7.59*  CALCIUM 8.0* 7.9*   CBG (last 3)   Recent Labs  07/22/14 2056  GLUCAP 131*   I have reviewed the patient's current medications. Prior to Admission:  Prescriptions prior to admission  Medication Sig Dispense Refill Last Dose  . CALCIUM PO Take 1 tablet by mouth  daily.   Past Week at Unknown time  . Cholecalciferol 1000 UNITS capsule Take 1,000 Units by mouth daily.   Past Week at Unknown time  . estradiol (VAGIFEM) 25 MCG vaginal tablet Place 25 mcg vaginally 2 (two) times a week.    Past Week at Unknown time  . folic acid (FOLVITE) 1 MG tablet Take 1 mg by mouth daily.   07/16/2014 at Unknown time  . hydrochlorothiazide (MICROZIDE) 12.5 MG capsule Take 12.5 mg by mouth as needed (for fluid).    07/16/2014 at Unknown time  . predniSONE (DELTASONE) 5 MG tablet Take 20 mg by mouth daily.   07/16/2014 at Unknown time  . HYDROcodone-acetaminophen (NORCO) 5-325 MG per tablet Take 1 tablet by mouth every 6 (six) hours as needed for moderate pain. 30 tablet 0    Scheduled: . amLODipine  10 mg Oral QHS  . calcium acetate  667 mg Oral TID WC  .  ceFAZolin (ANCEF) IV  2 g Intravenous On Call  . cloNIDine  0.1 mg Oral TID  . darbepoetin (ARANESP) injection - DIALYSIS  200 mcg Intravenous Q Wed-HD  . hydrALAZINE  25 mg Oral 3 times per day  . multivitamin  1 tablet Oral QHS  . predniSONE  20 mg Oral QAC breakfast  . sodium chloride  3 mL Intravenous Q12H   Continuous: . sodium chloride 10 mL/hr at 07/18/14 1400  .  sodium chloride     [id number], butalbital-acetaminophen-caffeine, hydrALAZINE, HYDROcodone-acetaminophen, morphine injection, ondansetron **OR** ondansetron (ZOFRAN) IV, oxyCODONE-acetaminophen, prochlorperazine, senna  Assessment/Plan:   ARF: Presenting with ARF of unknown etiology. Etiology still unknown but possibly secondary to scleroderma renal crisis, subacute drug reaction or uncontrolled HTN. Patient underwent renal biopsy yesterday with results pending. Creatinine continues to increase. GFR of 5. Urine output -950 yesterday. -Setting up for dialysis-discussed with Dr. Barbie Banner with IR who will preform permacath placement -Place permacath today -Start HD today BX pending, uremic, suspect HA, N will get better with HD.  To get PC.   Hypertensive Emergency: Improved, normotensive. Lower vol and bp meds -Continue amlodipine 10 mg daily -Continue clonodine 0.1 mg TID -Continue Hydralazine 50 mg TID  -Lasix 80 mg po BID    Hyperphosphatemia: Phosphorous 9.2 this morning.  -Begin PhosLo 667 mg TID WC -Will improve with HD  Iron Deficiency Anemia: Hemoglobin trending down: 8.5 to 7.5 this morning after renal biopsy yesterday.  -Monitor CBC -No heparin during HD  Hypervolemic Hyponatremia: Likely secondary to renal failure. UOsm high at 329 with high urine Na of 41. -Lasix 80 mg po BID stop Lasix -Fluid restrict -Will slowly improve with HD  Hypokalemia: Resolved, potassium 3.7 today.   -Continue to trend renal function panel  RA: Holding methotrexate for now.  -Continue prednisone 20 mg daily   LOS: 7 days   Osa Craver, DO PGY-2 Internal Medicine Resident Pager # [phone] 07/24/2014 11:57 AM I have seen and examined this patient and agree with the plan of care seen, eval, examined, counseled patient. Discussed with resident. .  Ellasyn Swilling L 07/24/2014, 12:17 PM

## 2014-07-24 NOTE — Sedation Documentation (Signed)
Patient denies pain and is resting comfortably.  

## 2014-07-24 NOTE — Procedures (Signed)
R IJ Palindrome 19 HD catheter placed No complication No blood loss. See complete dictation in [facility].

## 2014-07-24 NOTE — Progress Notes (Signed)
PT Cancellation Note  Patient Details Name: Erin Morgan MRN: [id number] DOB: 1955/08/15   Cancelled Treatment:    Reason Eval/Treat Not Completed: Patient declined due to getting dialysis catheter placement later today and would like to wait til after she has that placed.  Discussed with nursing and will check back tomorrow.   [name] 07/24/2014, 11:23 AM

## 2014-07-24 NOTE — Progress Notes (Signed)
Patient ID: Erin Morgan, female   DOB: November 29, 1955, 59 y.o.   MRN: [id number] Preliminary on Renal BX TMA.  Suspect is all malig HTN.  Not much interstitial change, and no onion skinning.  Will check LDH and hapto but no evidence hemolysis or low ptlt.

## 2014-07-24 NOTE — Consult Note (Signed)
Chief Complaint: Patient was seen in consultation today for worsening renal function at the request of Dr Deterding Chief Complaint  Patient presents with  . Headache  . Nausea  . Hypertension    Referring Physician(s): Dr Deterding Dr Doyle Askew  History of Present Illness: Erin Morgan is a 59 y.o. female   Pt with Rheumatoid arthritis Using methotrexate for treatment Noted increase in Cr on routine labs Noted HTN  Microscopic hematuria and proteinuria Continued worsening renal fxn Renal bx performed 7/19 with Dr Jimmy Footman Now scheduled for tunneled hemodialysis catheter placement Per Dr Deterding request I have seen and examined pt   Past Medical History  Diagnosis Date  . Wears glasses   . Osteopenia   . Seasonal allergies   . PONV (postoperative nausea and vomiting)     Past Surgical History  Procedure Laterality Date  . Tonsillectomy    . Colonoscopy    . Tumor excision  2000    right foot  . Carpal tunnel release Right 04/05/2014    Procedure: RIGHT CARPAL TUNNEL RELEASE;  Surgeon: Daryll Brod, MD;  Location: [facility];  Service: Orthopedics;  Laterality: Right;  ANESTHESIA:  IV REGIONAL FAB    Allergies: Review of patient's allergies indicates no known allergies.  Medications: Prior to Admission medications   Medication Sig Start Date End Date Taking? Authorizing Provider  CALCIUM PO Take 1 tablet by mouth daily.   Yes Historical Provider, MD  Cholecalciferol 1000 UNITS capsule Take 1,000 Units by mouth daily.   Yes Historical Provider, MD  estradiol (VAGIFEM) 25 MCG vaginal tablet Place 25 mcg vaginally 2 (two) times a week.    Yes Historical Provider, MD  folic acid (FOLVITE) 1 MG tablet Take 1 mg by mouth daily. 06/11/14  Yes Historical Provider, MD  hydrochlorothiazide (MICROZIDE) 12.5 MG capsule Take 12.5 mg by mouth as needed (for fluid).    Yes Historical Provider, MD  predniSONE (DELTASONE) 5 MG tablet Take 20 mg by mouth  daily. 06/12/14  Yes Historical Provider, MD  HYDROcodone-acetaminophen (NORCO) 5-325 MG per tablet Take 1 tablet by mouth every 6 (six) hours as needed for moderate pain. 04/05/14   Daryll Brod, MD     Family History  Problem Relation Age of Onset  . Hypertension Mother   . Cervical cancer Mother   . Hypertension Father   . Stroke Father   . Rheum arthritis Other     History   Social History  . Marital Status: Widowed    Spouse Name: N/A  . Number of Children: N/A  . Years of Education: N/A   Social History Main Topics  . Smoking status: Never Smoker   . Smokeless tobacco: Not on file  . Alcohol Use: No     Comment:  no  . Drug Use: No  . Sexual Activity: No   Other Topics Concern  . None   Social History Narrative     Review of Systems: A 12 point ROS discussed and pertinent positives are indicated in the HPI above.  All other systems are negative.  Review of Systems  Constitutional: Positive for activity change, appetite change and fatigue. Negative for fever.  Respiratory: Negative for shortness of breath.   Cardiovascular: Negative for chest pain.  Neurological: Positive for weakness.  Psychiatric/Behavioral: Negative for behavioral problems and confusion.    Vital Signs: BP 129/58 mmHg  Pulse 81  Temp(Src) 98.5 F (36.9 C) (Oral)  Resp 18  Ht 5\' 4"  (1.626  m)  Wt 155 lb 6.4 oz (70.489 kg)  BMI 26.66 kg/m2  SpO2 95%  Physical Exam  Constitutional: She is oriented to person, place, and time.  Cardiovascular: Normal rate, regular rhythm and normal heart sounds.   No murmur heard. Pulmonary/Chest: Effort normal. She has no wheezes.  Abdominal: Soft. Bowel sounds are normal. There is no tenderness.  Musculoskeletal: Normal range of motion.  Neurological: She is alert and oriented to person, place, and time.  Skin: Skin is warm and dry.  Psychiatric: She has a normal mood and affect. Her behavior is normal. Judgment and thought content normal.  Nursing  note and vitals reviewed.   Mallampati Score:  MD Evaluation Airway: WNL Heart: WNL Abdomen: WNL Chest/ Lungs: WNL ASA  Classification: 3 Mallampati/Airway Score: One  Imaging: Dg Shoulder Right  07/09/2014   CLINICAL DATA:  Right shoulder injury.  Fall.  EXAM: RIGHT SHOULDER - 2+ VIEW  COMPARISON:  None.  FINDINGS: Fracture of the greater tuberosity of the proximal right humerus is present. No other abnormality identified. No evidence of dislocation or separation.  IMPRESSION: Minimally displaced fracture of the greater tuberosity of the proximal right humerus.   Electronically Signed   By: Marcello Moores  Register   On: 07/09/2014 13:58   Ct Head Wo Contrast  07/17/2014   CLINICAL DATA:  59 year old female with headache, nausea and leukocytosis  EXAM: CT HEAD WITHOUT CONTRAST  TECHNIQUE: Contiguous axial images were obtained from the base of the skull through the vertex without intravenous contrast.  COMPARISON:  None.  FINDINGS: Negative for acute intracranial hemorrhage, acute infarction, mass, mass effect, hydrocephalus or midline shift. Gray-white differentiation is preserved throughout. No acute soft tissue or calvarial abnormality. The globes and orbits are symmetric and unremarkable. Normal aeration of the mastoid air cells and visualized paranasal sinuses. Small sclerotic focus in the right frontal calvarium just above the orbit likely represents a benign bone island.  IMPRESSION: Negative head CT.   Electronically Signed   By: Jacqulynn Cadet M.D.   On: 07/17/2014 17:04   Mr Brain Wo Contrast  07/18/2014   CLINICAL DATA:  Initial evaluation for acute headache.  EXAM: MRI HEAD WITHOUT CONTRAST  TECHNIQUE: Multiplanar, multiecho pulse sequences of the brain and surrounding structures were obtained without intravenous contrast.  COMPARISON:  Prior CT from 07/17/2014  FINDINGS: The CSF containing spaces are within normal limits for patient age. Few scattered T2/FLAIR hyperintense foci present  within the periventricular and deep white matter of both cerebral hemispheres, nonspecific. No mass lesion, midline shift, or extra-axial fluid collection. Ventricles are normal in size without evidence of hydrocephalus.  No diffusion-weighted signal abnormality is identified to suggest acute intracranial infarct. Gray-white matter differentiation is maintained. Normal flow voids are seen within the intracranial vasculature. No intracranial hemorrhage identified.  The cervicomedullary junction is normal. Pituitary gland is within normal limits. Pituitary stalk is midline. The globes and optic nerves demonstrate a normal appearance with normal signal intensity. The  The bone marrow signal intensity is normal. Calvarium is intact. Visualized upper cervical spine is within normal limits.  Scalp soft tissues are unremarkable.  Paranasal sinuses are clear.  No mastoid effusion.  IMPRESSION: 1. No acute intracranial process identified. 2. Few scattered subcentimeter T2/FLAIR height matter foci within the supratentorial white matter. These foci are nonspecific, but may be related to very mild chronic small vessel ischemic changes. These are felt to be within normal limits for patient age. 3. Otherwise normal brain MRI   Electronically Signed  By: Jeannine Boga M.D.   On: 07/18/2014 06:53   US Renal  07/18/2014   CLINICAL DATA:  Acute renal failure.  EXAM: RENAL / URINARY TRACT ULTRASOUND COMPLETE  COMPARISON:  None.  FINDINGS: Right Kidney:  Length: 10.0 cm. Echogenicity within normal limits. No mass or hydronephrosis visualized.  Left Kidney:  Length: 9.4 cm. Echogenicity within normal limits. No mass or hydronephrosis visualized.  Bladder:  Appears normal for degree of bladder distention.  Small bilateral pleural effusions incidentally noted.  IMPRESSION: No evidence of hydronephrosis or other renal abnormality.  Small bilateral pleural effusions incidentally noted.   Electronically Signed   By: Earle Gell  M.D.   On: 07/18/2014 11:55   US Biopsy  07/23/2014   CLINICAL DATA:  Renal insufficiency  EXAM: ULTRASOUND BIOPSY  COMPARISON:  None.  FINDINGS: Imaging was provided to the Nephrology service for renal biopsy.  IMPRESSION: See above.   Electronically Signed   By: Marybelle Killings M.D.   On: 07/23/2014 13:26   Dg Humerus Right  07/09/2014   CLINICAL DATA:  Injury to the right shoulder and humerus after fall on July 03, 2014 with pain.  EXAM: RIGHT HUMERUS - 2+ VIEW  COMPARISON:  None.  FINDINGS: There is comminuted displaced fracture of the lateral aspect of the proximal right humerus. The visualized right ribs and right lung are normal.  IMPRESSION: Fracture of lateral aspect of proximal right humerus.   Electronically Signed   By: Abelardo Diesel M.D.   On: 07/09/2014 13:58   Dg Hip Unilat With Pelvis 2-3 Views Right  07/09/2014   CLINICAL DATA:  Fall with injury to the right hip 07/03/2014. Pain and bruising. Decreased range of motion.  EXAM: RIGHT HIP (WITH PELVIS) 2-3 VIEWS  COMPARISON:  None.  FINDINGS: Right hip is located. No acute bone or soft tissue abnormality is present. The pelvis is intact.  IMPRESSION: Negative right hip radiographs.   Electronically Signed   By: San Morelle M.D.   On: 07/09/2014 14:13    Labs:  CBC:  Recent Labs  07/23/14 0506 07/23/14 0756 07/23/14 1943 07/24/14 0358  WBC 9.4 10.2 8.1 10.4  HGB 8.4* 8.5* 7.8* 7.5*  HCT 24.7* 25.1* 22.7* 22.0*  PLT 177 188 164 188    COAGS:  Recent Labs  07/18/14 0053 07/22/14 1228  INR 0.98 1.01  APTT  --  28    BMP:  Recent Labs  07/21/14 1300 07/22/14 0330 07/23/14 0506 07/24/14 0358  NA 120* 122* 121* 120*  K 3.2* 3.6 3.2* 3.7  CL 79* 81* 80* 77*  CO2 27 26 25 24   GLUCOSE 104* 97 81 95  BUN 69* 68* 73* 76*  CALCIUM 7.6* 8.2* 8.0* 7.9*  CREATININE 5.74* 6.10* 7.16* 7.59*  GFRNONAA 7* 7* 6* 5*  GFRAA 8* 8* 6* 6*    LIVER FUNCTION TESTS:  Recent Labs  07/17/14 1625 07/18/14 0430   07/21/14 1300 07/22/14 0330 07/23/14 0506 07/24/14 0358  BILITOT 1.1 1.0  --   --   --   --   --   AST 33 27  --   --   --   --   --   ALT 19 17  --   --   --   --   --   ALKPHOS 51 45  --   --   --   --   --   PROT 6.1* 5.6*  --   --   --   --   --  ALBUMIN 3.9 3.5  < > 3.1* 3.3* 3.2* 3.1*  < > = values in this interval not displayed.  TUMOR MARKERS: No results for input(s): AFPTM, CEA, CA199, CHROMGRNA in the last 8760 hours.  Assessment and Plan:  Worsening renal fxn Scheduled for tunneled dialysis catheter placement Risks and Benefits discussed with the patient including, but not limited to bleeding, infection, vascular injury, pneumothorax which may require chest tube placement, air embolism or even death All of the patient's questions were answered, patient is agreeable to proceed. Consent signed and in chart.    Thank you for this interesting consult.  I greatly enjoyed meeting Erin Morgan and look forward to participating in their care.  A copy of this report was sent to the requesting provider on this date.  Signed: Angala Hilgers A 07/24/2014, 8:58 AM   I spent a total of 20 Minutes    in face to face in clinical consultation, greater than 50% of which was counseling/coordinating care for HD cath

## 2014-07-24 NOTE — Progress Notes (Signed)
Patient ID: Erin Morgan, female   DOB: [birth year], 59 y.o.   MRN: [id number]  [facility] PROGRESS NOTE  Erin Morgan [id number] DOB: Oct 26, 1955 DOA: 07/17/2014 PCP: Nicola Girt, DO   Brief narrative:    59 y.o. female w/ a history of RA and osteopenia who presented with 3-4 months of edema and generalized joint pain. Patient was diagnosed with RA in May and was tx w/ prednisone initially and then was started on methotrexate. Due to rising Cr and anemia the methotrexate was stopped with last dose being almost 2 weeks prior to this admission.   In the ED CT of the head was unremarkable. Cr was up to 3.78, Hg 9.4 WBC 10.4 Of note 2 weeks prior she traveled to [place] where she had fell and broke her right arm. She was seen by Orthopedics and is wearing a splint.   Assessment/Plan:    Acute renal failure - MCTD workup in progress but serologies thus far nondiagnostic - s/p kidney biopsy 7/19, biopsy results pending  - plan for HD for volume control  - management per nephrology team   Rheumatoid arthritis  - Continue prednisone 20 mg daily  - holding MTX  - appears reasonably stable at present  - no focal joint complaints  Acute encephalopathy - more somnolent this AM, suspect from acute illness, hyponatremia, ? Uremic - suspect will get better with HD   Normocytic anemia - Iron studies suggest iron deficiency  - dosed with Feraheme 07/20/14 - Hg remains overall stable with no signs of active bleeding  Hypervolemic Hyponatremia - Due to acute renal failure - stop Lasix per nephrology team  Hypokalemia - supplemented and WNL this AM  Hypertensive urgency  - currently on Norvasc, Hydralazine, Clonidine  - Patient had been on hydralazine prior to admission > anti-histone antibody negative - Echocardiogram notes no impairment in systolic fxn or WMA - Nephrology adjusted medical therapy 7/18  - reasonable inpatient control for now   Persistent  waxing/waning HA  - better this AM  DVT prophylaxis - SCD's  Code Status: Full.  Family Communication:  plan of care discussed with the patient and family  Disposition Plan: Home when stable.   IV access:  Peripheral IV  Procedures and diagnostic studies:    Dg Shoulder Right 07/09/2014  Minimally displaced fracture of the greater tuberosity of the proximal right humerus.    Ct Head Wo Contrast 07/17/2014  Negative head CT.   Mr Brain Wo Contrast 07/18/2014  No acute intracranial process identified. 2. Few scattered subcentimeter T2/FLAIR height matter foci within the supratentorial white matter. These foci are nonspecific, but may be related to very mild chronic small vessel ischemic changes. These are felt to be within normal limits for patient age.   US Renal 07/18/2014   No evidence of hydronephrosis or other renal abnormality.    Medical Consultants:  Nephrology  Other Consultants:  None  IAnti-Infectives:   None  Faye Ramsay, MD  [facility] Pager [phone]   If 7PM-7AM, please contact night-coverage [email or web address] Password [email or web address] 07/24/2014, 2:00 PM   LOS: 7 days   HPI/Subjective: No events overnight.   Objective: Filed Vitals:   07/23/14 1740 07/23/14 2031 07/24/14 0533 07/24/14 1000  BP: 134/60 136/73 129/58 158/68  Pulse: 86 100 81 84  Temp: 98.4 F (36.9 C) 97.7 F (36.5 C) 98.5 F (36.9 C) 98.3 F (36.8 C)  TempSrc: Oral Oral Oral Oral  Resp: 18 17 18 18   Height:  Weight:  70.489 kg (155 lb 6.4 oz)    SpO2: 95% 94% 95% 96%    Intake/Output Summary (Last 24 hours) at 07/24/14 1400 Last data filed at 07/24/14 1300  Gross per 24 hour  Intake    360 ml  Output   1300 ml  Net   -940 ml    Exam:   General:  Pt is somnolent but easy to arouse, NAD  Cardiovascular: Regular rate and rhythm, no rubs, no gallops  Respiratory:  no wheezing, diminished breath sounds   Abdomen: Soft, non tender, non distended, bowel sounds present, no  guarding  Extremities: Trace bilateral LE edema, pulses DP and PT palpable bilaterally  Neuro: Grossly nonfocal  Data Reviewed: Basic Metabolic Panel:  Recent Labs Lab 07/18/14 0430  07/21/14 0658 07/21/14 1300 07/22/14 0330 07/23/14 0506 07/24/14 0358  NA 131*  < > 123* 120* 122* 121* 120*  K 3.0*  < > 2.5* 3.2* 3.6 3.2* 3.7  CL 93*  < > 80* 79* 81* 80* 77*  CO2 25  < > 30 27 26 25 24   GLUCOSE 119*  < > 96 104* 97 81 95  BUN 55*  < > 68* 69* 68* 73* 76*  CREATININE 4.23*  < > 5.70* 5.74* 6.10* 7.16* 7.59*  CALCIUM 8.5*  < > 8.0* 7.6* 8.2* 8.0* 7.9*  MG 2.4  --   --   --   --   --   --   PHOS 6.1*  < > 6.6* 6.3* 7.5* 8.2* 9.2*  < > = values in this interval not displayed. Liver Function Tests:  Recent Labs Lab 07/17/14 1625 07/18/14 0430  07/21/14 0658 07/21/14 1300 07/22/14 0330 07/23/14 0506 07/24/14 0358  AST 33 27  --   --   --   --   --   --   ALT 19 17  --   --   --   --   --   --   ALKPHOS 51 45  --   --   --   --   --   --   BILITOT 1.1 1.0  --   --   --   --   --   --   PROT 6.1* 5.6*  --   --   --   --   --   --   ALBUMIN 3.9 3.5  < > 3.1* 3.1* 3.3* 3.2* 3.1*  < > = values in this interval not displayed.  CBC:  Recent Labs Lab 07/17/14 1625  07/18/14 0430 07/23/14 0506 07/23/14 0756 07/23/14 1943 07/24/14 0358  WBC 10.4  < > 12.8* 9.4 10.2 8.1 10.4  NEUTROABS 9.0*  --  9.9*  --  7.1  --   --   HGB 9.4*  < > 8.9* 8.4* 8.5* 7.8* 7.5*  HCT 28.4*  < > 26.6* 24.7* 25.1* 22.7* 22.0*  MCV 91.0  < > 91.1 90.1 89.6 88.0 88.0  PLT 175  < > 174 177 188 164 188  < > = values in this interval not displayed. Cardiac Enzymes:  Recent Labs Lab 07/18/14 0053 07/18/14 0430 07/18/14 1104  TROPONINI 0.07* 0.06*  0.06* 0.09*   BNP: Invalid input(s): POCBNP CBG:  Recent Labs Lab 07/22/14 2056  GLUCAP 131*    Recent Results (from the past 240 hour(s))  Urine culture     Status: None   Collection Time: 07/17/14  9:15 PM  Result Value Ref Range  Status  Specimen Description URINE, CLEAN CATCH  Final   Special Requests NONE  Final   Culture   Final    MULTIPLE SPECIES PRESENT, SUGGEST RECOLLECTION IF CLINICALLY INDICATED   Report Status 07/19/2014 FINAL  Final  MRSA PCR Screening     Status: None   Collection Time: 07/17/14 11:09 PM  Result Value Ref Range Status   MRSA by PCR NEGATIVE NEGATIVE Final    Comment:        The GeneXpert MRSA Assay (FDA approved for NASAL specimens only), is one component of a comprehensive MRSA colonization surveillance program. It is not intended to diagnose MRSA infection nor to guide or monitor treatment for MRSA infections.      Scheduled Meds: . amLODipine  10 mg Oral QHS  . calcium acetate  667 mg Oral TID WC  . ceFAZolin      .  ceFAZolin (ANCEF) IV  2 g Intravenous On Call  . cloNIDine  0.1 mg Oral TID  . darbepoetin (ARANESP) injection - DIALYSIS  200 mcg Intravenous Q Wed-HD  . fentaNYL      . heparin      . hydrALAZINE  25 mg Oral 3 times per day  . lidocaine      . midazolam      . multivitamin  1 tablet Oral QHS  . predniSONE  20 mg Oral QAC breakfast  . sodium chloride  3 mL Intravenous Q12H   Continuous Infusions: . sodium chloride 10 mL/hr at 07/18/14 1400  . sodium chloride

## 2014-07-24 NOTE — Procedures (Signed)
I was present at this session.  I have reviewed the session itself and made appropriate changes. tol well, oozing at cath site, IR called.  Julizza Sassone L 7/20/20163:46 PM

## 2014-07-25 LAB — DIFFERENTIAL
Basophils Absolute: 0 10*3/uL (ref 0.0–0.1)
Basophils Relative: 0 % (ref 0–1)
EOS ABS: 0 10*3/uL (ref 0.0–0.7)
Eosinophils Relative: 0 % (ref 0–5)
Lymphocytes Relative: 8 % — ABNORMAL LOW (ref 12–46)
Lymphs Abs: 0.8 10*3/uL (ref 0.7–4.0)
MONO ABS: 0.7 10*3/uL (ref 0.1–1.0)
Monocytes Relative: 7 % (ref 3–12)
Neutro Abs: 9.1 10*3/uL — ABNORMAL HIGH (ref 1.7–7.7)
Neutrophils Relative %: 85 % — ABNORMAL HIGH (ref 43–77)

## 2014-07-25 LAB — CBC
HCT: 23.4 % — ABNORMAL LOW (ref 36.0–46.0)
Hemoglobin: 8 g/dL — ABNORMAL LOW (ref 12.0–15.0)
MCH: 30.7 pg (ref 26.0–34.0)
MCHC: 34.2 g/dL (ref 30.0–36.0)
MCV: 89.7 fL (ref 78.0–100.0)
Platelets: 197 10*3/uL (ref 150–400)
RBC: 2.61 MIL/uL — AB (ref 3.87–5.11)
RDW: 16.5 % — AB (ref 11.5–15.5)
WBC: 10.6 10*3/uL — ABNORMAL HIGH (ref 4.0–10.5)

## 2014-07-25 LAB — RENAL FUNCTION PANEL
ALBUMIN: 3.5 g/dL (ref 3.5–5.0)
ANION GAP: 15 (ref 5–15)
BUN: 50 mg/dL — AB (ref 6–20)
CO2: 23 mmol/L (ref 22–32)
Calcium: 8.3 mg/dL — ABNORMAL LOW (ref 8.9–10.3)
Chloride: 89 mmol/L — ABNORMAL LOW (ref 101–111)
Creatinine, Ser: 5.85 mg/dL — ABNORMAL HIGH (ref 0.44–1.00)
GFR calc Af Amer: 8 mL/min — ABNORMAL LOW (ref >60)
GFR calc non Af Amer: 7 mL/min — ABNORMAL LOW (ref >60)
GLUCOSE: 116 mg/dL — AB (ref 65–99)
POTASSIUM: 3.9 mmol/L (ref 3.5–5.1)
Phosphorus: 7.7 mg/dL — ABNORMAL HIGH (ref 2.5–4.6)
Sodium: 127 mmol/L — ABNORMAL LOW (ref 135–145)

## 2014-07-25 LAB — RETICULOCYTES
RBC.: 2.61 MIL/uL — AB (ref 3.87–5.11)
RETIC COUNT ABSOLUTE: 130.5 10*3/uL (ref 19.0–186.0)
Retic Ct Pct: 5 % — ABNORMAL HIGH (ref 0.4–3.1)

## 2014-07-25 LAB — LACTATE DEHYDROGENASE: LDH: 291 U/L — AB (ref 98–192)

## 2014-07-25 MED ORDER — NEPRO/CARBSTEADY PO LIQD: 237.0000 mL | ORAL | Status: DC | PRN

## 2014-07-25 MED ORDER — SODIUM CHLORIDE 0.9 % IV SOLN: 100.0000 mL | INTRAVENOUS | Status: DC | PRN

## 2014-07-25 MED ORDER — LIDOCAINE HCL (PF) 1 % IJ SOLN: 5.0000 mL | INTRAMUSCULAR | Status: DC | PRN

## 2014-07-25 MED ORDER — PENTAFLUOROPROP-TETRAFLUOROETH EX AERO: 1.0000 "application " | INHALATION_SPRAY | CUTANEOUS | Status: DC | PRN

## 2014-07-25 MED ORDER — DARBEPOETIN ALFA 200 MCG/0.4ML IJ SOSY
PREFILLED_SYRINGE | INTRAMUSCULAR | Status: AC
  Administered 2014-07-25: 200 ug via INTRAVENOUS
  Filled 2014-07-25: qty 0.4

## 2014-07-25 MED ORDER — LIDOCAINE-PRILOCAINE 2.5-2.5 % EX CREA
1.0000 "application " | TOPICAL_CREAM | CUTANEOUS | Status: DC | PRN
  Filled 2014-07-25: qty 5

## 2014-07-25 MED ORDER — KIDNEY FAILURE BOOK
Freq: Once | Status: AC
  Administered 2014-07-25: 14:00:00
  Filled 2014-07-25: qty 1

## 2014-07-25 MED ORDER — ALTEPLASE 2 MG IJ SOLR
2.0000 mg | Freq: Once | INTRAMUSCULAR | Status: DC | PRN
  Filled 2014-07-25: qty 2

## 2014-07-25 MED ORDER — HEPARIN SODIUM (PORCINE) 1000 UNIT/ML DIALYSIS: 1000.0000 [IU] | INTRAMUSCULAR | Status: DC | PRN

## 2014-07-25 NOTE — Progress Notes (Signed)
PT Cancellation Note  Patient Details Name: Erin Morgan MRN: [id number] DOB: September 28, 1955   Cancelled Treatment:    Reason Eval/Treat Not Completed: Patient declined, no reason specified.  Not wearing her RUE sling on recently fractured R shoulder.   Ramond Dial 07/25/2014, 2:18 PM   Mee Hives, PT MS Acute Rehab Dept. Number: [facility] [id number] and [place] [phone]

## 2014-07-25 NOTE — Progress Notes (Signed)
Patient ID: Erin Morgan, female   DOB: [birth year], 59 y.o.   MRN: [id number]  [facility] PROGRESS NOTE  Erin Morgan [id number] DOB: 22-May-1955 DOA: 07/17/2014 PCP: Nicola Girt, DO   Brief narrative:    59 y.o. female w/ a history of RA and osteopenia who presented with 3-4 months of edema and generalized joint pain. Patient was diagnosed with RA in May and was tx w/ prednisone initially and then was started on methotrexate. Due to rising Cr and anemia the methotrexate was stopped with last dose being almost 2 weeks prior to this admission.   In the ED CT of the head was unremarkable. Cr was up to 3.78, Hg 9.4 WBC 10.4 Of note 2 weeks prior she traveled to [place] where she had fell and broke her right arm. She was seen by Orthopedics and is wearing a splint.   Assessment/Plan:    Acute renal failure - preliminary renal biopsy showed thrombotic microangiopathy, ? TTP (ADAMTS13 deficiency), HUS, immune mediated or toxin mediated - ? malignant HTN - following tests pending: ADAMTS14, LDH, haptoglobin, smear review  - continue HD per nephrology team, assistance is appreciated   Rheumatoid arthritis  - Continue prednisone 20 mg daily  - holding MTX  - appears reasonably stable at present  - no focal joint complaints  Acute encephalopathy - much better this AM after HD yesterday   Normocytic anemia - Iron studies suggest iron deficiency  - dosed with Feraheme 07/20/14 - Hg remains overall stable with no signs of active bleeding  Hypervolemic Hyponatremia - Due to acute renal failure - better after HD  Hypokalemia - supplemented and WNL this AM  Hypertensive urgency  - currently on Norvasc, Hydralazine, Clonidine  - Patient had been on hydralazine prior to admission > anti-histone antibody negative - Echocardiogram notes no impairment in systolic fxn or WMA - Nephrology adjusted medical therapy 7/18   Persistent waxing/waning HA  - better this  AM  DVT prophylaxis - SCD's  Code Status: Full.  Family Communication:  plan of care discussed with the patient and family  Disposition Plan: Home when stable.   IV access:  Peripheral IV  Procedures and diagnostic studies:    Dg Shoulder Right 07/09/2014  Minimally displaced fracture of the greater tuberosity of the proximal right humerus.    Ct Head Wo Contrast 07/17/2014  Negative head CT.   Mr Brain Wo Contrast 07/18/2014  No acute intracranial process identified. 2. Few scattered subcentimeter T2/FLAIR height matter foci within the supratentorial white matter. These foci are nonspecific, but may be related to very mild chronic small vessel ischemic changes. These are felt to be within normal limits for patient age.   US Renal 07/18/2014   No evidence of hydronephrosis or other renal abnormality.    Medical Consultants:  Nephrology  Other Consultants:  None  IAnti-Infectives:   None  Faye Ramsay, MD  [facility] Pager [phone]   If 7PM-7AM, please contact night-coverage [email or web address] Password [facility] 07/25/2014, 4:55 PM   LOS: 8 days   HPI/Subjective: No events overnight.   Objective: Filed Vitals:   07/25/14 1100 07/25/14 1130 07/25/14 1200 07/25/14 1228  BP: 166/85 166/91 171/90 178/94  Pulse: 88 86 86 90  Temp:    97.9 F (36.6 C)  TempSrc:    Oral  Resp:    15  Height:      Weight:    61.2 kg (134 lb 14.7 oz)  SpO2:    98%    Intake/Output  Summary (Last 24 hours) at 07/25/14 1655 Last data filed at 07/25/14 1228  Gross per 24 hour  Intake    723 ml  Output   5500 ml  Net  -4777 ml    Exam:   General:  Pt is more alert, NAD   Cardiovascular: Regular rate and rhythm, no rubs, no gallops  Respiratory:  no wheezing, diminished breath sounds   Abdomen: Soft, non tender, non distended, bowel sounds present, no guarding  Extremities: Trace bilateral LE edema, pulses DP and PT palpable bilaterally  Neuro: Grossly nonfocal  Data  Reviewed: Basic Metabolic Panel:  Recent Labs Lab 07/21/14 1300 07/22/14 0330 07/23/14 0506 07/24/14 0358 07/25/14 0801  NA 120* 122* 121* 120* 127*  K 3.2* 3.6 3.2* 3.7 3.9  CL 79* 81* 80* 77* 89*  CO2 27 26 25 24 23   GLUCOSE 104* 97 81 95 116*  BUN 69* 68* 73* 76* 50*  CREATININE 5.74* 6.10* 7.16* 7.59* 5.85*  CALCIUM 7.6* 8.2* 8.0* 7.9* 8.3*  PHOS 6.3* 7.5* 8.2* 9.2* 7.7*   Liver Function Tests:  Recent Labs Lab 07/21/14 1300 07/22/14 0330 07/23/14 0506 07/24/14 0358 07/25/14 0801  ALBUMIN 3.1* 3.3* 3.2* 3.1* 3.5    CBC:  Recent Labs Lab 07/23/14 0506 07/23/14 0756 07/23/14 1943 07/24/14 0358 07/25/14 0801  WBC 9.4 10.2 8.1 10.4 10.6*  NEUTROABS  --  7.1  --   --  9.1*  HGB 8.4* 8.5* 7.8* 7.5* 8.0*  HCT 24.7* 25.1* 22.7* 22.0* 23.4*  MCV 90.1 89.6 88.0 88.0 89.7  PLT 177 188 164 188 197   CBG:  Recent Labs Lab 07/22/14 2056  GLUCAP 131*    Recent Results (from the past 240 hour(s))  Urine culture     Status: None   Collection Time: 07/17/14  9:15 PM  Result Value Ref Range Status   Specimen Description URINE, CLEAN CATCH  Final   Special Requests NONE  Final   Culture   Final    MULTIPLE SPECIES PRESENT, SUGGEST RECOLLECTION IF CLINICALLY INDICATED   Report Status 07/19/2014 FINAL  Final  MRSA PCR Screening     Status: None   Collection Time: 07/17/14 11:09 PM  Result Value Ref Range Status   MRSA by PCR NEGATIVE NEGATIVE Final    Comment:        The GeneXpert MRSA Assay (FDA approved for NASAL specimens only), is one component of a comprehensive MRSA colonization surveillance program. It is not intended to diagnose MRSA infection nor to guide or monitor treatment for MRSA infections.      Scheduled Meds: . amLODipine  10 mg Oral QHS  . calcium acetate  667 mg Oral TID WC  . cloNIDine  0.1 mg Oral TID  . darbepoetin (ARANESP) injection - DIALYSIS  200 mcg Intravenous Q Wed-HD  . hydrALAZINE  25 mg Oral 3 times per day  .  multivitamin  1 tablet Oral QHS  . predniSONE  20 mg Oral QAC breakfast  . sodium chloride  3 mL Intravenous Q12H   Continuous Infusions: . sodium chloride 10 mL/hr at 07/18/14 1400  . sodium chloride

## 2014-07-25 NOTE — Procedures (Signed)
I was present at this session.  I have reviewed the session itself and made appropriate changes.  tol well, cath function ok. Low flows. Vol coming off , tol well  Jalena Vanderlinden L 7/21/201611:42 AM

## 2014-07-25 NOTE — Progress Notes (Signed)
Subjective:  Erin Morgan is a 59 yo female presenting with ARF with unknown etiology. Patient was seen and examined this morning. She went for permacath placement yesterday and received HD. Patient is back in HD this morning and states she feels much better. Her headache and nausea have improved and her energy has increased. She is tolerating HD very well.    Objective: Filed Vitals:   07/25/14 1100 07/25/14 1130 07/25/14 1200 07/25/14 1228  BP: 166/85 166/91 171/90 178/94  Pulse: 88 86 86 90  Temp:      TempSrc:    Oral  Resp:    15  Height:      Weight:    134 lb 14.7 oz (61.2 kg)  SpO2:    98%   General: Vital signs reviewed. Patient is well-developed and well-nourished, in no acute distress and cooperative with exam.  Neck: R IJ permacath Cardiovascular: RRR, S1 normal, S2 normal Pulmonary/Chest: Clear to auscultation bilaterally, no wheezes, rales, or rhonchi. Abdominal: Soft, non-tender, non-distended, BS +. + Sacral edema.  Extremities: 1+ lower extremity pitting edema bilaterally, pulses symmetric and intact bilaterally. Skin: Warm, dry and intact. No rashes or erythema.  Intake/Output from previous day: 07/20 0701 - 07/21 0700 In: 723 [P.O.:720; I.V.:3] Out: 3700 [Urine:1200] Intake/Output this shift: Total I/O In: -  Out: 2500 [Other:2500]  Lab Results:  Recent Labs  07/24/14 0358 07/25/14 0801  WBC 10.4 10.6*  HGB 7.5* 8.0*  HCT 22.0* 23.4*  PLT 188 197   BMET:   Recent Labs  07/24/14 0358 07/25/14 0801  NA 120* 127*  K 3.7 3.9  CL 77* 89*  CO2 24 23  GLUCOSE 95 116*  BUN 76* 50*  CREATININE 7.59* 5.85*  CALCIUM 7.9* 8.3*   CBG (last 3)   Recent Labs  07/22/14 2056  GLUCAP 131*   I have reviewed the patient's current medications. Prior to Admission:  Prescriptions prior to admission  Medication Sig Dispense Refill Last Dose  . CALCIUM PO Take 1 tablet by mouth daily.   Past Week at Unknown time  . Cholecalciferol 1000 UNITS capsule  Take 1,000 Units by mouth daily.   Past Week at Unknown time  . estradiol (VAGIFEM) 25 MCG vaginal tablet Place 25 mcg vaginally 2 (two) times a week.    Past Week at Unknown time  . folic acid (FOLVITE) 1 MG tablet Take 1 mg by mouth daily.   07/16/2014 at Unknown time  . hydrochlorothiazide (MICROZIDE) 12.5 MG capsule Take 12.5 mg by mouth as needed (for fluid).    07/16/2014 at Unknown time  . predniSONE (DELTASONE) 5 MG tablet Take 20 mg by mouth daily.   07/16/2014 at Unknown time  . HYDROcodone-acetaminophen (NORCO) 5-325 MG per tablet Take 1 tablet by mouth every 6 (six) hours as needed for moderate pain. 30 tablet 0    Scheduled: . amLODipine  10 mg Oral QHS  . calcium acetate  667 mg Oral TID WC  . cloNIDine  0.1 mg Oral TID  . darbepoetin (ARANESP) injection - DIALYSIS  200 mcg Intravenous Q Wed-HD  . hydrALAZINE  25 mg Oral 3 times per day  . multivitamin  1 tablet Oral QHS  . predniSONE  20 mg Oral QAC breakfast  . sodium chloride  3 mL Intravenous Q12H   Continuous: . sodium chloride 10 mL/hr at 07/18/14 1400  . sodium chloride     [id number], butalbital-acetaminophen-caffeine, hydrALAZINE, HYDROcodone-acetaminophen, morphine injection, ondansetron **OR** ondansetron (ZOFRAN) IV, oxyCODONE-acetaminophen, prochlorperazine, senna  Assessment/Plan:   ARF: Presenting with ARF of unknown etiology. Preliminary renal biopsy results showed thrombotic microangiopathy which can be caused by TTP (severe ADAMTS13 deficiency), HUS, immune mediated or toxin mediated. Results also showed little interstitial change and no onion skinning. We will continue to pursue work up. Etiology may be most likely related to malignant HTN. Urine output -1120 yesterday. Total output with dialysis is 3700 yesterday.  -ADAMTS13 -haptoglobin -LDH -Smear review -Reticulocyte count -Renal function panel daily -Continue HD -Review records for BP evaluation Doing much better after first HD, do again for  vol, SNa , uremia.  Lower vol.  W/U underway  Hypertension: Controlled, normotensive. Will need to monitor as HD continues.  -Continue amlodipine 10 mg daily -Continue clonodine 0.1 mg TID -Continue Hydralazine 25 mg TID      Hyperphosphatemia: Phosphorous 7.7 this morning.  -PhosLo 667 mg TID WC -Continue HD  Iron Deficiency Anemia: Hemoglobin trending: 8.5 > 7.5 > 8.0 this morning in the setting of recent biopsy and permacath placement. -Monitor CBC -No heparin during HD  Hypervolemic Hyponatremia: Sodium this morning 127, improving. Likely secondary to renal failure.  -Will slowly improve with HD  Hypokalemia: Resolved, potassium 3.9 today.   -Continue to trend renal function panel  RA: Holding methotrexate for now.  -Continue prednisone 20 mg daily   LOS: 8 days   Osa Craver, DO PGY-2 Internal Medicine Resident Pager # [phone] 07/25/2014 1:19 PM

## 2014-07-26 LAB — CBC
HCT: 23.6 % — ABNORMAL LOW (ref 36.0–46.0)
Hemoglobin: 7.9 g/dL — ABNORMAL LOW (ref 12.0–15.0)
MCH: 30.7 pg (ref 26.0–34.0)
MCHC: 33.5 g/dL (ref 30.0–36.0)
MCV: 91.8 fL (ref 78.0–100.0)
PLATELETS: 187 10*3/uL (ref 150–400)
RBC: 2.57 MIL/uL — ABNORMAL LOW (ref 3.87–5.11)
RDW: 17 % — ABNORMAL HIGH (ref 11.5–15.5)
WBC: 11.4 10*3/uL — ABNORMAL HIGH (ref 4.0–10.5)

## 2014-07-26 LAB — RENAL FUNCTION PANEL
ALBUMIN: 3.2 g/dL — AB (ref 3.5–5.0)
Anion gap: 11 (ref 5–15)
BUN: 31 mg/dL — AB (ref 6–20)
CHLORIDE: 92 mmol/L — AB (ref 101–111)
CO2: 25 mmol/L (ref 22–32)
Calcium: 8.2 mg/dL — ABNORMAL LOW (ref 8.9–10.3)
Creatinine, Ser: 4.59 mg/dL — ABNORMAL HIGH (ref 0.44–1.00)
GFR, EST AFRICAN AMERICAN: 11 mL/min — AB (ref >60)
GFR, EST NON AFRICAN AMERICAN: 10 mL/min — AB (ref >60)
Glucose, Bld: 109 mg/dL — ABNORMAL HIGH (ref 65–99)
POTASSIUM: 3.7 mmol/L (ref 3.5–5.1)
Phosphorus: 3.7 mg/dL (ref 2.5–4.6)
SODIUM: 128 mmol/L — AB (ref 135–145)

## 2014-07-26 LAB — ADAMTS13 ACTIVITY REFLEX

## 2014-07-26 LAB — HAPTOGLOBIN: Haptoglobin: 25 mg/dL — ABNORMAL LOW (ref 34–200)

## 2014-07-26 LAB — ADAMTS13 ACTIVITY: ADAMTS 13 ACTIVITY: 54 % — AB (ref >66)

## 2014-07-26 MED ORDER — SENNOSIDES-DOCUSATE SODIUM 8.6-50 MG PO TABS
1.0000 | ORAL_TABLET | Freq: Two times a day (BID) | ORAL | Status: DC
  Administered 2014-07-26 – 2014-08-04 (×11): 1 via ORAL
  Filled 2014-07-26 (×18): qty 1

## 2014-07-26 MED ORDER — BISACODYL 10 MG RE SUPP
10.0000 mg | Freq: Two times a day (BID) | RECTAL | Status: DC
  Administered 2014-07-26 – 2014-08-08 (×9): 10 mg via RECTAL
  Filled 2014-07-26 (×14): qty 1

## 2014-07-26 MED ORDER — POLYETHYLENE GLYCOL 3350 17 G PO PACK
17.0000 g | PACK | Freq: Two times a day (BID) | ORAL | Status: AC
  Administered 2014-07-26 (×2): 17 g via ORAL
  Filled 2014-07-26 (×4): qty 1

## 2014-07-26 MED ORDER — PANTOPRAZOLE SODIUM 20 MG PO TBEC
20.0000 mg | DELAYED_RELEASE_TABLET | Freq: Every day | ORAL | Status: DC
  Administered 2014-07-26 – 2014-08-09 (×14): 20 mg via ORAL
  Filled 2014-07-26 (×19): qty 1

## 2014-07-26 MED ORDER — NEPRO/CARBSTEADY PO LIQD
237.0000 mL | Freq: Two times a day (BID) | ORAL | Status: DC
  Administered 2014-07-27 – 2014-08-07 (×9): 237 mL via ORAL

## 2014-07-26 MED ORDER — HYDRALAZINE HCL 50 MG PO TABS
50.0000 mg | ORAL_TABLET | Freq: Three times a day (TID) | ORAL | Status: DC
  Administered 2014-07-26 – 2014-08-09 (×35): 50 mg via ORAL
  Filled 2014-07-26 (×40): qty 1

## 2014-07-26 NOTE — Progress Notes (Signed)
Initial Nutrition Assessment  DOCUMENTATION CODES:   Not applicable  INTERVENTION:   Provide Nepro Shake po BID, each supplement provides 425 kcal and 19 grams protein.  Provide nourishment snacks. (ordered).  Encourage adequate PO intake.  NUTRITION DIAGNOSIS:   Inadequate oral intake related to poor appetite as evidenced by meal completion < 50%.  GOAL:   Patient will meet greater than or equal to 90% of their needs  MONITOR:   PO intake, Supplement acceptance, Weight trends, Labs, I & O's  REASON FOR ASSESSMENT:   Consult Assessment of nutrition requirement/status  ASSESSMENT:   59 y.o. female w/ a history of RA and osteopenia who presented with 3-4 months of edema and generalized joint pain. Patient was diagnosed with RA in May and was tx w/ prednisone initially and then was started on methotrexate. Due to rising Cr and anemia the methotrexate was stopped with last dose being almost 2 weeks prior to this admission. Presents with ARF . preliminary renal biopsy showed thrombotic microangiopathy, ? TTP (ADAMTS13 deficiency), HUS, immune mediated or toxin mediated. Improving with HD. Per MD note, pt may have a ileus.   Pt reports having a decreased appetite. Meal completion has been varied from 0-50%. Pt with n/v this AM. Pt reports PTA she had one week of having an "on and off" appetite, however has been consuming 3 meals a day. Weight stable. Pt requested nourishment snacks. RD to order as well as Nepro Shakes to aid in caloric and protein needs. Pt was encouraged to eat her food at meals.  NFPE completed. Pt with no observed significant fat or muscle mass loss.   Labs and medications reviewed. Potassium and phosphorous WNL.  Diet Order:  Diet renal with fluid restriction Fluid restriction:: 1200 mL Fluid; Room service appropriate?: Yes; Fluid consistency:: Thin  Skin:   (Incision L flank, non-pitting LE edema)  Last BM:  7/12- meds given  Height:   Ht Readings  from Last 1 Encounters:  07/17/14 5\' 4"  (1.626 m)    Weight:   Wt Readings from Last 1 Encounters:  07/25/14 137 lb 5.6 oz (62.3 kg)    Ideal Body Weight:  54.5 kg  Wt Readings from Last 10 Encounters:  07/25/14 137 lb 5.6 oz (62.3 kg)  04/05/14 137 lb (62.143 kg)  05/21/12 132 lb (59.875 kg)    BMI:  Body mass index is 23.56 kg/(m^2).  Estimated Nutritional Needs:   Kcal:  1700-1900  Protein:  80-95 grams  Fluid:  1.2 L/day  EDUCATION NEEDS:   No education needs identified at this time  Corrin Parker, MS, RD, LDN Pager # [phone] After hours/ weekend pager # [phone]

## 2014-07-26 NOTE — Progress Notes (Signed)
Subjective:  Erin Morgan is a 59 yo female presenting with ARF with unknown etiology. Patient was seen and examined this morning.   Patient reports nausea with vomiting this morning.   Objective: Filed Vitals:   07/26/14 0040 07/26/14 0500 07/26/14 0828 07/26/14 1231  BP: 119/62 158/72 158/77 162/80  Pulse: 76 83 83   Temp:  98.1 F (36.7 C) 97.7 F (36.5 C)   TempSrc:  Oral Oral   Resp:  18 18   Height:      Weight:      SpO2:  95% 97%    General: Vital signs reviewed. Patient is well-developed and well-nourished, in no acute distress and cooperative with exam.  Neck: R IJ permacath Cardiovascular: RRR, S1 normal, S2 normal Pulmonary/Chest: Clear to auscultation bilaterally, no wheezes, rales, or rhonchi. Abdominal: Soft, non-tender, non-distended, BS +.   Extremities: Trace lower extremity pitting edema bilaterally, pulses symmetric and intact bilaterally. Skin: Warm, dry and intact. No rashes or erythema.  Intake/Output from previous day: 07/21 0701 - 07/22 0700 In: 240 [P.O.:240] Out: 2850 [Urine:350] Intake/Output this shift: Total I/O In: 240 [P.O.:240] Out: 100 [Urine:100]  Lab Results:  Recent Labs  07/25/14 0801 07/26/14 0528  WBC 10.6* 11.4*  HGB 8.0* 7.9*  HCT 23.4* 23.6*  PLT 197 187   BMET:   Recent Labs  07/25/14 0801 07/26/14 0528  NA 127* 128*  K 3.9 3.7  CL 89* 92*  CO2 23 25  GLUCOSE 116* 109*  BUN 50* 31*  CREATININE 5.85* 4.59*  CALCIUM 8.3* 8.2*   CBG (last 3)  No results for input(s): GLUCAP in the last 72 hours. I have reviewed the patient's current medications. Prior to Admission:  Prescriptions prior to admission  Medication Sig Dispense Refill Last Dose  . CALCIUM PO Take 1 tablet by mouth daily.   Past Week at Unknown time  . Cholecalciferol 1000 UNITS capsule Take 1,000 Units by mouth daily.   Past Week at Unknown time  . estradiol (VAGIFEM) 25 MCG vaginal tablet Place 25 mcg vaginally 2 (two) times a week.    Past  Week at Unknown time  . folic acid (FOLVITE) 1 MG tablet Take 1 mg by mouth daily.   07/16/2014 at Unknown time  . hydrochlorothiazide (MICROZIDE) 12.5 MG capsule Take 12.5 mg by mouth as needed (for fluid).    07/16/2014 at Unknown time  . predniSONE (DELTASONE) 5 MG tablet Take 20 mg by mouth daily.   07/16/2014 at Unknown time  . HYDROcodone-acetaminophen (NORCO) 5-325 MG per tablet Take 1 tablet by mouth every 6 (six) hours as needed for moderate pain. 30 tablet 0    Scheduled: . amLODipine  10 mg Oral QHS  . bisacodyl  10 mg Rectal BID  . calcium acetate  667 mg Oral TID WC  . cloNIDine  0.1 mg Oral TID  . darbepoetin (ARANESP) injection - DIALYSIS  200 mcg Intravenous Q Wed-HD  . hydrALAZINE  25 mg Oral 3 times per day  . multivitamin  1 tablet Oral QHS  . polyethylene glycol  17 g Oral BID  . predniSONE  20 mg Oral QAC breakfast  . sodium chloride  3 mL Intravenous Q12H   Continuous: . sodium chloride 10 mL/hr at 07/18/14 1400  . sodium chloride     [id number], butalbital-acetaminophen-caffeine, hydrALAZINE, HYDROcodone-acetaminophen, morphine injection, ondansetron **OR** ondansetron (ZOFRAN) IV, oxyCODONE-acetaminophen, prochlorperazine  Assessment/Plan:   ARF: Presenting with ARF of unknown etiology. 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 Etiology may be most likely related to malignant HTN; however, patient states BP has always been either normal or mildly elevated. Upon calling her PCP office, her records indicate the following BP trend: 04/08/14: 110/70, 06/19/14 141/82, 07/09/14 136/96, and 07/17/14 142/88; which is consistent with her self-reported BPs. ADAMTS13 activity mildly low at 54 (normal is >66%), haptoglobin low, LDH high at 291, Retic high at 5%.  -Smear review pending -Renal function panel daily -Resume HD tomorrow, Will watch Cr in am for  signs recovery.  Follow urine vol also.    Hypertension: Mildly hypertensive this morning.  -Continue amlodipine 10 mg daily -Continue clonodine 0.1 mg TID -Continue Hydralazine 25 mg TID   Slowly taper with vol control  Hyperphosphatemia: Improved, phosphorous 3.7 this morning.  -PhosLo 667 mg TID WC -Continue HD  Iron Deficiency Anemia: Hemoglobin trending: 8.5 > 7.5 > 8.0>7.9 this morning in the setting of recent biopsy and permacath placement. -Monitor CBC  Hypervolemic Hyponatremia: Sodium this morning 128, improving. Likely secondary to renal failure.  -Will slowly improve with HD  Hypokalemia: Resolved, potassium 3.7 today.   -Continue to trend renal function panel  Constipation: Patient has not had a bowel movement in over one week despite Senokot. -Miralax BID  RA: Holding methotrexate for now.  -Continue prednisone 20 mg daily   LOS: 9 days   Osa Craver, DO PGY-2 Internal Medicine Resident Pager # [phone] 07/26/2014 1:49 PM

## 2014-07-26 NOTE — Progress Notes (Signed)
PT Cancellation/Discharge Note  Patient Details Name: Xiclaly Elsaesser MRN: [id number] DOB: [birth year]   Cancelled Treatment:    Reason Eval/Treat Not Completed: Other (comment).  Pt reports independence in mobility. Pt's mother reports she gets to the bathroom and has been walking in the hallway at night independently.  Pt reports she does not believe she has any equipment or therapy needs at this time. PT to sign off.  Thanks,    Barbarann Ehlers. [place], [place], DPT [phone]   07/26/2014, 2:13 PM

## 2014-07-26 NOTE — Progress Notes (Signed)
Patient ID: Erin Morgan, female   DOB: 12/21/1955, 59 y.o.   MRN: [id number]  [facility] PROGRESS NOTE  Erin Morgan [id number] DOB: August 18, 1955 DOA: 07/17/2014 PCP: Nicola Girt, DO   Brief narrative:    59 y.o. female w/ a history of RA and osteopenia who presented with 3-4 months of edema and generalized joint pain. Patient was diagnosed with RA in May and was tx w/ prednisone initially and then was started on methotrexate. Due to rising Cr and anemia the methotrexate was stopped with last dose being almost 2 weeks prior to this admission.   In the ED CT of the head was unremarkable. Cr was up to 3.78, Hg 9.4 WBC 10.4 Of note 2 weeks prior she traveled to [place] where she had fell and broke her right arm. She was seen by Orthopedics and is wearing a splint.   Assessment/Plan:    Nausea and vomiting - this AM before eating breakfast  - ? Component of constipation and possible ileus (very sluggish bowel sounds on exam)  - asked for abd XRAY - provide senna and add bisacodyl supp to see if this will help   Acute renal failure - preliminary renal biopsy showed thrombotic microangiopathy, ? TTP (ADAMTS13 deficiency), HUS, immune mediated or toxin mediated - ? malignant HTN - following tests still pending: ADAMTS14, LDH, haptoglobin, smear review  - continue HD per nephrology team, assistance is appreciated  - session on 7/21 and again today 7/22  Leukocytosis - unclear why WBC is trending up, will ask for acute abd xray with chest to make sure no ileus vs developing infection   Rheumatoid arthritis  - Continue prednisone 20 mg daily  - holding MTX  - appears reasonably stable at present  - no focal joint complaints  Acute encephalopathy - overall better this AM  Normocytic anemia - Iron studies suggest iron deficiency  - dosed with Feraheme 07/20/14 - Hg remains overall stable with no signs of active bleeding  Hypervolemic Hyponatremia - Due to  acute renal failure - better after HD  Hypokalemia - supplemented and WNL this AM  Hypertensive urgency  - currently on Norvasc, Hydralazine, Clonidine  - Patient had been on hydralazine prior to admission > anti-histone antibody negative - Echocardiogram notes no impairment in systolic fxn or WMA - Nephrology adjusted medical therapy 7/18   Persistent waxing/waning HA  - better this AM  Moderate malnutrition - nutritionist consulted    DVT prophylaxis - SCD's  Code Status: Full.  Family Communication:  plan of care discussed with the patient and family  Disposition Plan: Home when stable.   IV access:  Peripheral IV  Procedures and diagnostic studies:    Dg Shoulder Right 07/09/2014  Minimally displaced fracture of the greater tuberosity of the proximal right humerus.    Ct Head Wo Contrast 07/17/2014  Negative head CT.   Mr Brain Wo Contrast 07/18/2014  No acute intracranial process identified. 2. Few scattered subcentimeter T2/FLAIR height matter foci within the supratentorial white matter. These foci are nonspecific, but may be related to very mild chronic small vessel ischemic changes. These are felt to be within normal limits for patient age.   US Renal 07/18/2014   No evidence of hydronephrosis or other renal abnormality.    Medical Consultants:  Nephrology  Other Consultants:  None  IAnti-Infectives:   None  Faye Ramsay, MD  [facility] Pager [phone]   If 7PM-7AM, please contact night-coverage [email or web address] Password [facility] 07/26/2014, 2:01 PM  LOS: 9 days   HPI/Subjective: No events overnight. Nausea and vomiting this AM.   Objective: Filed Vitals:   07/26/14 0040 07/26/14 0500 07/26/14 0828 07/26/14 1231  BP: 119/62 158/72 158/77 162/80  Pulse: 76 83 83   Temp:  98.1 F (36.7 C) 97.7 F (36.5 C)   TempSrc:  Oral Oral   Resp:  18 18   Height:      Weight:      SpO2:  95% 97%     Intake/Output Summary (Last 24 hours) at 07/26/14  1401 Last data filed at 07/26/14 1325  Gross per 24 hour  Intake    480 ml  Output    450 ml  Net     30 ml    Exam:   General:  Pt is more alert, NAD   Cardiovascular: Regular rate and rhythm, no rubs, no gallops  Respiratory:  no wheezing, diminished breath sounds   Abdomen: Soft, non tender, non distended, bowel sounds present, no guarding  Extremities: Trace bilateral LE edema, pulses DP and PT palpable bilaterally  Neuro: Grossly nonfocal  Data Reviewed: Basic Metabolic Panel:  Recent Labs Lab 07/22/14 0330 07/23/14 0506 07/24/14 0358 07/25/14 0801 07/26/14 0528  NA 122* 121* 120* 127* 128*  K 3.6 3.2* 3.7 3.9 3.7  CL 81* 80* 77* 89* 92*  CO2 26 25 24 23 25   GLUCOSE 97 81 95 116* 109*  BUN 68* 73* 76* 50* 31*  CREATININE 6.10* 7.16* 7.59* 5.85* 4.59*  CALCIUM 8.2* 8.0* 7.9* 8.3* 8.2*  PHOS 7.5* 8.2* 9.2* 7.7* 3.7   Liver Function Tests:  Recent Labs Lab 07/22/14 0330 07/23/14 0506 07/24/14 0358 07/25/14 0801 07/26/14 0528  ALBUMIN 3.3* 3.2* 3.1* 3.5 3.2*    CBC:  Recent Labs Lab 07/23/14 0756 07/23/14 1943 07/24/14 0358 07/25/14 0801 07/26/14 0528  WBC 10.2 8.1 10.4 10.6* 11.4*  NEUTROABS 7.1  --   --  9.1*  --   HGB 8.5* 7.8* 7.5* 8.0* 7.9*  HCT 25.1* 22.7* 22.0* 23.4* 23.6*  MCV 89.6 88.0 88.0 89.7 91.8  PLT 188 164 188 197 187   CBG:  Recent Labs Lab 07/22/14 2056  GLUCAP 131*    Recent Results (from the past 240 hour(s))  Urine culture     Status: None   Collection Time: 07/17/14  9:15 PM  Result Value Ref Range Status   Specimen Description URINE, CLEAN CATCH  Final   Special Requests NONE  Final   Culture   Final    MULTIPLE SPECIES PRESENT, SUGGEST RECOLLECTION IF CLINICALLY INDICATED   Report Status 07/19/2014 FINAL  Final  MRSA PCR Screening     Status: None   Collection Time: 07/17/14 11:09 PM  Result Value Ref Range Status   MRSA by PCR NEGATIVE NEGATIVE Final    Comment:        The GeneXpert MRSA Assay  (FDA approved for NASAL specimens only), is one component of a comprehensive MRSA colonization surveillance program. It is not intended to diagnose MRSA infection nor to guide or monitor treatment for MRSA infections.      Scheduled Meds: . amLODipine  10 mg Oral QHS  . bisacodyl  10 mg Rectal BID  . calcium acetate  667 mg Oral TID WC  . cloNIDine  0.1 mg Oral TID  . darbepoetin (ARANESP) injection - DIALYSIS  200 mcg Intravenous Q Wed-HD  . hydrALAZINE  25 mg Oral 3 times per day  . multivitamin  1 tablet  Oral QHS  . polyethylene glycol  17 g Oral BID  . predniSONE  20 mg Oral QAC breakfast  . sodium chloride  3 mL Intravenous Q12H   Continuous Infusions: . sodium chloride 10 mL/hr at 07/18/14 1400  . sodium chloride

## 2014-07-27 LAB — RENAL FUNCTION PANEL
Albumin: 3.2 g/dL — ABNORMAL LOW (ref 3.5–5.0)
Albumin: 3.2 g/dL — ABNORMAL LOW (ref 3.5–5.0)
Anion gap: 11 (ref 5–15)
Anion gap: 11 (ref 5–15)
BUN: 38 mg/dL — ABNORMAL HIGH (ref 6–20)
BUN: 41 mg/dL — ABNORMAL HIGH (ref 6–20)
CO2: 24 mmol/L (ref 22–32)
CO2: 25 mmol/L (ref 22–32)
Calcium: 8.5 mg/dL — ABNORMAL LOW (ref 8.9–10.3)
Calcium: 8.4 mg/dL — ABNORMAL LOW (ref 8.9–10.3)
Chloride: 89 mmol/L — ABNORMAL LOW (ref 101–111)
Chloride: 84 mmol/L — ABNORMAL LOW (ref 101–111)
Creatinine, Ser: 5.28 mg/dL — ABNORMAL HIGH (ref 0.44–1.00)
Creatinine, Ser: 5.62 mg/dL — ABNORMAL HIGH (ref 0.44–1.00)
GFR calc Af Amer: 9 mL/min — ABNORMAL LOW (ref >60)
GFR calc Af Amer: 9 mL/min — ABNORMAL LOW (ref >60)
GFR calc non Af Amer: 8 mL/min — ABNORMAL LOW (ref >60)
GFR calc non Af Amer: 7 mL/min — ABNORMAL LOW (ref >60)
GLUCOSE: 136 mg/dL — AB (ref 65–99)
Glucose, Bld: 104 mg/dL — ABNORMAL HIGH (ref 65–99)
PHOSPHORUS: 2.9 mg/dL (ref 2.5–4.6)
PHOSPHORUS: 2.4 mg/dL — AB (ref 2.5–4.6)
POTASSIUM: 3.9 mmol/L (ref 3.5–5.1)
Potassium: 3.9 mmol/L (ref 3.5–5.1)
Sodium: 124 mmol/L — ABNORMAL LOW (ref 135–145)
Sodium: 120 mmol/L — ABNORMAL LOW (ref 135–145)

## 2014-07-27 LAB — CBC
HCT: 24.3 % — ABNORMAL LOW (ref 36.0–46.0)
HCT: 24.3 % — ABNORMAL LOW (ref 36.0–46.0)
HEMOGLOBIN: 8.1 g/dL — AB (ref 12.0–15.0)
HEMOGLOBIN: 8 g/dL — AB (ref 12.0–15.0)
MCH: 30.6 pg (ref 26.0–34.0)
MCH: 30.2 pg (ref 26.0–34.0)
MCHC: 33.3 g/dL (ref 30.0–36.0)
MCHC: 32.9 g/dL (ref 30.0–36.0)
MCV: 91.7 fL (ref 78.0–100.0)
MCV: 91.7 fL (ref 78.0–100.0)
PLATELETS: 219 10*3/uL (ref 150–400)
Platelets: 207 10*3/uL (ref 150–400)
RBC: 2.65 MIL/uL — ABNORMAL LOW (ref 3.87–5.11)
RBC: 2.65 MIL/uL — ABNORMAL LOW (ref 3.87–5.11)
RDW: 16.9 % — ABNORMAL HIGH (ref 11.5–15.5)
RDW: 16.7 % — ABNORMAL HIGH (ref 11.5–15.5)
WBC: 12.8 10*3/uL — AB (ref 4.0–10.5)
WBC: 11.6 10*3/uL — ABNORMAL HIGH (ref 4.0–10.5)

## 2014-07-27 MED ORDER — SODIUM CHLORIDE 0.9 % IV SOLN: 100.0000 mL | INTRAVENOUS | Status: DC | PRN

## 2014-07-27 MED ORDER — LIDOCAINE-PRILOCAINE 2.5-2.5 % EX CREA
1.0000 "application " | TOPICAL_CREAM | CUTANEOUS | Status: DC | PRN
  Filled 2014-07-27: qty 5

## 2014-07-27 MED ORDER — NEOMYCIN-POLYMYXIN-DEXAMETH 3.5-10000-0.1 OP SUSP
2.0000 [drp] | Freq: Two times a day (BID) | OPHTHALMIC | Status: DC
Start: 2014-07-27 — End: 2014-08-09
  Administered 2014-07-27 – 2014-08-08 (×14): 2 [drp] via OPHTHALMIC
  Filled 2014-07-27 (×5): qty 5

## 2014-07-27 MED ORDER — HEPARIN SODIUM (PORCINE) 1000 UNIT/ML DIALYSIS
100.0000 [IU]/kg | INTRAMUSCULAR | Status: DC | PRN
  Filled 2014-07-27: qty 7

## 2014-07-27 MED ORDER — PENTAFLUOROPROP-TETRAFLUOROETH EX AERO: 1.0000 "application " | INHALATION_SPRAY | CUTANEOUS | Status: DC | PRN

## 2014-07-27 MED ORDER — ALTEPLASE 2 MG IJ SOLR
2.0000 mg | Freq: Once | INTRAMUSCULAR | Status: AC | PRN
  Filled 2014-07-27: qty 2

## 2014-07-27 MED ORDER — NEPRO/CARBSTEADY PO LIQD: 237.0000 mL | ORAL | Status: DC | PRN

## 2014-07-27 MED ORDER — LIDOCAINE HCL (PF) 1 % IJ SOLN: 5.0000 mL | INTRAMUSCULAR | Status: DC | PRN

## 2014-07-27 MED ORDER — HEPARIN SODIUM (PORCINE) 1000 UNIT/ML DIALYSIS: 1000.0000 [IU] | INTRAMUSCULAR | Status: DC | PRN

## 2014-07-27 NOTE — Procedures (Signed)
I was present at this session.  I have reviewed the session itself and made appropriate changes.  HD via RIJ PC. To remove 3 l. Still low flows 3rd HD  Rheta Hemmelgarn L 7/23/20161:56 PM

## 2014-07-27 NOTE — Progress Notes (Signed)
Patient ID: Erin Morgan, female   DOB: 1955/01/05, 59 y.o.   MRN: [id number]  [facility] PROGRESS NOTE  Junko Longaker [id number] DOB: 1955-06-17 DOA: 07/17/2014 PCP: Nicola Girt, DO   Brief narrative:    59 y.o. female w/ a history of RA and osteopenia who presented with 3-4 months of edema and generalized joint pain. Patient was diagnosed with RA in May and was tx w/ prednisone initially and then was started on methotrexate. Due to rising Cr and anemia the methotrexate was stopped with last dose being almost 2 weeks prior to this admission.   In the ED CT of the head was unremarkable. Cr was up to 3.78, Hg 9.4 WBC 10.4 Of note 2 weeks prior she traveled to [place] where she had fell and broke her right arm. She was seen by Orthopedics and is wearing a splint.   Assessment/Plan:    Nausea and vomiting - this AM before eating breakfast  - had BM yesterday and this AM  - continue to provide senna and add bisacodyl supp to see if this will help   Acute renal failure - preliminary renal biopsy showed thrombotic microangiopathy, ? TTP (ADAMTS13 deficiency), HUS, immune mediated or toxin mediated - ? malignant HTN - following tests still pending: ADAMTS14, LDH, haptoglobin, smear review  - continue HD per nephrology team, assistance is appreciated  - session on 7/21 and again today 7/23  Leukocytosis - unclear why WBC is trending up, pt refused CXR and abd xray - ok to hold off on imaging studies for now   Rheumatoid arthritis  - Continue prednisone 20 mg daily  - holding MTX  - appears reasonably stable at present  - no focal joint complaints  Acute encephalopathy - overall better this AM, sitting in chair   Normocytic anemia - Iron studies suggest iron deficiency  - dosed with Feraheme 07/20/14 - Hg remains overall stable with no signs of active bleeding  Hypervolemic Hyponatremia - Due to acute renal failure - hope will get better with HD    Hypokalemia - supplemented and WNL this AM  Hypertensive urgency  - currently on Norvasc, Hydralazine, Clonidine  - Patient had been on hydralazine prior to admission > anti-histone antibody negative - Echocardiogram notes no impairment in systolic fxn or WMA - Nephrology adjusted medical therapy 7/18   Persistent waxing/waning HA  - better this AM  Inadequate oral intake in the setting of acute illness  - nutritionist consulted, recommendations appreciated   DVT prophylaxis - SCD's  Code Status: Full.  Family Communication:  plan of care discussed with the patient and family  Disposition Plan: Home when stable.   IV access:  Peripheral IV  Procedures and diagnostic studies:    Dg Shoulder Right 07/09/2014  Minimally displaced fracture of the greater tuberosity of the proximal right humerus.    Ct Head Wo Contrast 07/17/2014  Negative head CT.   Mr Brain Wo Contrast 07/18/2014  No acute intracranial process identified. 2. Few scattered subcentimeter T2/FLAIR height matter foci within the supratentorial white matter. These foci are nonspecific, but may be related to very mild chronic small vessel ischemic changes. These are felt to be within normal limits for patient age.   US Renal 07/18/2014   No evidence of hydronephrosis or other renal abnormality.    Medical Consultants:  Nephrology  Other Consultants:  None  IAnti-Infectives:   None  Faye Ramsay, MD  [facility] Pager [phone]   If 7PM-7AM, please contact night-coverage [email or web address]  Password [email or web address] 07/27/2014, 2:36 PM   LOS: 10 days   HPI/Subjective: No events overnight. Feels better this AM, still with nausea.   Objective: Filed Vitals:   07/27/14 0832 07/27/14 1355 07/27/14 1405 07/27/14 1430  BP: 151/72 157/87 169/96 159/92  Pulse: 83 72 81 80  Temp: 98.6 F (37 C) 97.3 F (36.3 C)    TempSrc: Oral Oral    Resp: 18 17    Height:      Weight:  64.6 kg (142 lb 6.7 oz)    SpO2: 98% 95%       Intake/Output Summary (Last 24 hours) at 07/27/14 1436 Last data filed at 07/27/14 1315  Gross per 24 hour  Intake    720 ml  Output    300 ml  Net    420 ml    Exam:   General:  Pt is more alert, NAD   Cardiovascular: Regular rate and rhythm, no rubs, no gallops  Respiratory:  no wheezing, diminished breath sounds   Abdomen: Soft, non tender, non distended, bowel sounds present, no guarding  Extremities: Trace bilateral LE edema, pulses DP and PT palpable bilaterally  Neuro: Grossly nonfocal  Data Reviewed: Basic Metabolic Panel:  Recent Labs Lab 07/23/14 0506 07/24/14 0358 07/25/14 0801 07/26/14 0528 07/27/14 0330  NA 121* 120* 127* 128* 124*  K 3.2* 3.7 3.9 3.7 3.9  CL 80* 77* 89* 92* 89*  CO2 25 24 23 25 24   GLUCOSE 81 95 116* 109* 104*  BUN 73* 76* 50* 31* 38*  CREATININE 7.16* 7.59* 5.85* 4.59* 5.28*  CALCIUM 8.0* 7.9* 8.3* 8.2* 8.5*  PHOS 8.2* 9.2* 7.7* 3.7 2.9   Liver Function Tests:  Recent Labs Lab 07/23/14 0506 07/24/14 0358 07/25/14 0801 07/26/14 0528 07/27/14 0330  ALBUMIN 3.2* 3.1* 3.5 3.2* 3.2*    CBC:  Recent Labs Lab 07/23/14 0756 07/23/14 1943 07/24/14 0358 07/25/14 0801 07/26/14 0528 07/27/14 0332  WBC 10.2 8.1 10.4 10.6* 11.4* 12.8*  NEUTROABS 7.1  --   --  9.1*  --   --   HGB 8.5* 7.8* 7.5* 8.0* 7.9* 8.1*  HCT 25.1* 22.7* 22.0* 23.4* 23.6* 24.3*  MCV 89.6 88.0 88.0 89.7 91.8 91.7  PLT 188 164 188 197 187 207   CBG:  Recent Labs Lab 07/22/14 2056  GLUCAP 131*    Recent Results (from the past 240 hour(s))  Urine culture     Status: None   Collection Time: 07/17/14  9:15 PM  Result Value Ref Range Status   Specimen Description URINE, CLEAN CATCH  Final   Special Requests NONE  Final   Culture   Final    MULTIPLE SPECIES PRESENT, SUGGEST RECOLLECTION IF CLINICALLY INDICATED   Report Status 07/19/2014 FINAL  Final  MRSA PCR Screening     Status: None   Collection Time: 07/17/14 11:09 PM  Result Value Ref  Range Status   MRSA by PCR NEGATIVE NEGATIVE Final    Comment:        The GeneXpert MRSA Assay (FDA approved for NASAL specimens only), is one component of a comprehensive MRSA colonization surveillance program. It is not intended to diagnose MRSA infection nor to guide or monitor treatment for MRSA infections.      Scheduled Meds: . amLODipine  10 mg Oral QHS  . bisacodyl  10 mg Rectal BID  . calcium acetate  667 mg Oral TID WC  . cloNIDine  0.1 mg Oral TID  . darbepoetin (ARANESP) injection - DIALYSIS  200 mcg Intravenous Q Wed-HD  . feeding supplement (NEPRO CARB STEADY)  237 mL Oral BID BM  . hydrALAZINE  50 mg Oral 3 times per day  . multivitamin  1 tablet Oral QHS  . neomycin-polymyxin b-dexamethasone  2 drop Right Eye Q12H  . pantoprazole  20 mg Oral Daily  . polyethylene glycol  17 g Oral BID  . predniSONE  20 mg Oral QAC breakfast  . senna-docusate  1 tablet Oral BID  . sodium chloride  3 mL Intravenous Q12H   Continuous Infusions: . sodium chloride 10 mL/hr at 07/18/14 1400  . sodium chloride

## 2014-07-27 NOTE — Progress Notes (Signed)
Subjective: Interval History: has complaints just wants to be better.  Objective: Vital signs in last 24 hours: Temp:  [98.1 F (36.7 C)-98.9 F (37.2 C)] 98.6 F (37 C) (07/23 0832) Pulse Rate:  [78-86] 83 (07/23 0832) Resp:  [17-19] 18 (07/23 0832) BP: (138-168)/(71-80) 151/72 mmHg (07/23 0832) SpO2:  [96 %-98 %] 98 % (07/23 0832) Weight:  [64.2 kg (141 lb 8.6 oz)] 64.2 kg (141 lb 8.6 oz) (07/22 2134) Weight change: 3 kg (6 lb 9.8 oz)  Intake/Output from previous day: 07/22 0701 - 07/23 0700 In: 600 [P.O.:600] Out: 400 [Urine:400] Intake/Output this shift: Total I/O In: 120 [P.O.:120] Out: 0   General appearance: alert, cooperative, no distress and pale Resp: clear to auscultation bilaterally Chest wall: RIJ cath Cardio: S1, S2 normal and systolic murmur: holosystolic 2/6, blowing at apex GI: pos bs, soft, liver down5 cm Extremities: edema 3+  Lab Results:  Recent Labs  07/26/14 0528 07/27/14 0332  WBC 11.4* 12.8*  HGB 7.9* 8.1*  HCT 23.6* 24.3*  PLT 187 207   BMET:  Recent Labs  07/26/14 0528 07/27/14 0330  NA 128* 124*  K 3.7 3.9  CL 92* 89*  CO2 25 24  GLUCOSE 109* 104*  BUN 31* 38*  CREATININE 4.59* 5.28*  CALCIUM 8.2* 8.5*   No results for input(s): PTH in the last 72 hours. Iron Studies: No results for input(s): IRON, TIBC, TRANSFERRIN, FERRITIN in the last 72 hours.  Studies/Results: No results found.  I have reviewed the patient's current medications.  Assessment/Plan: 1 AKI some urine but not enough to get by.  Low SNa dilutional.  Vol xs, not uremic , will do HD today. TMA wu neg 2 HTN better and will improve with removal fluid 3 Anemia stable 4 RA Pred controlling P HD, bp meds,     LOS: 10 days   Teira Arcilla L 07/27/2014,8:54 AM

## 2014-07-28 LAB — CBC
HCT: 24 % — ABNORMAL LOW (ref 36.0–46.0)
Hemoglobin: 7.7 g/dL — ABNORMAL LOW (ref 12.0–15.0)
MCH: 30.2 pg (ref 26.0–34.0)
MCHC: 32.1 g/dL (ref 30.0–36.0)
MCV: 94.1 fL (ref 78.0–100.0)
PLATELETS: 208 10*3/uL (ref 150–400)
RBC: 2.55 MIL/uL — AB (ref 3.87–5.11)
RDW: 16.9 % — AB (ref 11.5–15.5)
WBC: 10.7 10*3/uL — ABNORMAL HIGH (ref 4.0–10.5)

## 2014-07-28 LAB — RENAL FUNCTION PANEL
ALBUMIN: 3.1 g/dL — AB (ref 3.5–5.0)
Anion gap: 9 (ref 5–15)
BUN: 25 mg/dL — ABNORMAL HIGH (ref 6–20)
CALCIUM: 8 mg/dL — AB (ref 8.9–10.3)
CO2: 24 mmol/L (ref 22–32)
Chloride: 92 mmol/L — ABNORMAL LOW (ref 101–111)
Creatinine, Ser: 4.18 mg/dL — ABNORMAL HIGH (ref 0.44–1.00)
GFR, EST AFRICAN AMERICAN: 12 mL/min — AB (ref >60)
GFR, EST NON AFRICAN AMERICAN: 11 mL/min — AB (ref >60)
Glucose, Bld: 89 mg/dL (ref 65–99)
PHOSPHORUS: 2.3 mg/dL — AB (ref 2.5–4.6)
Potassium: 3.2 mmol/L — ABNORMAL LOW (ref 3.5–5.1)
SODIUM: 125 mmol/L — AB (ref 135–145)

## 2014-07-28 MED ORDER — POTASSIUM CHLORIDE CRYS ER 20 MEQ PO TBCR
40.0000 meq | EXTENDED_RELEASE_TABLET | Freq: Once | ORAL | Status: AC
  Administered 2014-07-28: 40 meq via ORAL
  Filled 2014-07-28: qty 2

## 2014-07-28 MED ORDER — POLYETHYLENE GLYCOL 3350 17 G PO PACK
17.0000 g | PACK | Freq: Two times a day (BID) | ORAL | Status: AC
Start: 2014-07-28 — End: 2014-07-30
  Filled 2014-07-28 (×2): qty 1

## 2014-07-28 NOTE — Progress Notes (Signed)
Subjective: Interval History: has no complaint , feels much better.  Objective: Vital signs in last 24 hours: Temp:  [97.3 F (36.3 C)-98.7 F (37.1 C)] 98.3 F (36.8 C) (07/24 0413) Pulse Rate:  [72-108] 81 (07/24 0413) Resp:  [16-18] 16 (07/24 0413) BP: (137-169)/(65-96) 138/82 mmHg (07/24 0413) SpO2:  [95 %-100 %] 97 % (07/24 0413) Weight:  [64.6 kg (142 lb 6.7 oz)-65.1 kg (143 lb 8.3 oz)] 65.1 kg (143 lb 8.3 oz) (07/23 2116) Weight change: 0.4 kg (14.1 oz)  Intake/Output from previous day: 07/23 0701 - 07/24 0700 In: 960 [P.O.:960] Out: 400 [Urine:400] Intake/Output this shift:    General appearance: alert, cooperative, no distress and pale Resp: clear to auscultation bilaterally Chest wall: RIJ PC Cardio: S1, S2 normal and systolic murmur: holosystolic 2/6, blowing at apex GI: pos bs, soft, liver down 4 cm Extremities: edema 1+  Lab Results:  Recent Labs  07/27/14 1440 07/28/14 0510  WBC 11.6* 10.7*  HGB 8.0* 7.7*  HCT 24.3* 24.0*  PLT 219 208   BMET:  Recent Labs  07/27/14 1440 07/28/14 0510  NA 120* 125*  K 3.9 3.2*  CL 84* 92*  CO2 25 24  GLUCOSE 136* 89  BUN 41* 25*  CREATININE 5.62* 4.18*  CALCIUM 8.4* 8.0*   No results for input(s): PTH in the last 72 hours. Iron Studies: No results for input(s): IRON, TIBC, TRANSFERRIN, FERRITIN in the last 72 hours.  Studies/Results: No results found.  I have reviewed the patient's current medications.  Assessment/Plan: 1 AKI did well on HD, better vol and solute.  Still vol xs as reflected in SNa.  More urine, follow 2 Anemia stable, esa/fe 3 HTn improving 4 TMA malig HTN 5 RA stable 6 Low SNa as above P check chem in am, control bp, K    LOS: 11 days   Karson Reede L 07/28/2014,7:53 AM

## 2014-07-28 NOTE — Progress Notes (Signed)
Patient ID: Erin Morgan, female   DOB: [birth year], 59 y.o.   MRN: [id number]  [facility] PROGRESS NOTE  Erin Morgan [id number] DOB: 02-Mar-1955 DOA: 07/17/2014 PCP: Nicola Girt, DO   Brief narrative:    59 y.o. female w/ a history of RA and osteopenia who presented with 3-4 months of edema and generalized joint pain. Patient was diagnosed with RA in May and was tx w/ prednisone initially and then was started on methotrexate. Due to rising Cr and anemia the methotrexate was stopped with last dose being almost 2 weeks prior to this admission.   In the ED CT of the head was unremarkable. Cr was up to 3.78, Hg 9.4 WBC 10.4 Of note 2 weeks prior she traveled to [place] where she had fell and broke her right arm. She was seen by Orthopedics and is wearing a splint.   Assessment/Plan:    Nausea and vomiting - still with nause  - had BM yesterday but not yet this AM - continue to provide senna and add bisacodyl supp to see if this will help   Acute renal failure - renal biopsy with thrombotic microangiopathy, ? TTP (ADAMTS13 deficiency), HUS, immune mediated or toxin mediated - ? malignant HTN - following tests still pending: ADAMTS14, LDH, haptoglobin - continue HD per nephrology team, assistance is appreciated  - session on 7/21, 7/23  Leukocytosis - WBC is now trending down  - ok to hold off on imaging studies for now   Rheumatoid arthritis  - Continue prednisone 20 mg daily  - holding MTX  - appears reasonably stable at present  - no focal joint complaints  Acute encephalopathy - overall better this AM, sitting in chair   Normocytic anemia - Iron studies suggest iron deficiency  - dosed with Feraheme 07/20/14 - Hg remains overall stable with no signs of active bleeding  Hypervolemic Hyponatremia - Due to acute renal failure - better this AM  Hypokalemia - address with HD  Hypertensive urgency  - currently on Norvasc, Hydralazine, Clonidine   - Patient had been on hydralazine prior to admission > anti-histone antibody negative - Echocardiogram notes no impairment in systolic fxn or WMA - Nephrology adjusted medical therapy 7/18   Persistent waxing/waning HA  - better this AM but feels lightheaded - will ask for orthostatic vitals   Inadequate oral intake in the setting of acute illness  - nutritionist consulted, recommendations appreciated   DVT prophylaxis - SCD's  Code Status: Full.  Family Communication:  plan of care discussed with the patient and family  Disposition Plan: Home when stable.   IV access:  Peripheral IV  Procedures and diagnostic studies:    Dg Shoulder Right 07/09/2014  Minimally displaced fracture of the greater tuberosity of the proximal right humerus.    Ct Head Wo Contrast 07/17/2014  Negative head CT.   Mr Brain Wo Contrast 07/18/2014  No acute intracranial process identified. 2. Few scattered subcentimeter T2/FLAIR height matter foci within the supratentorial white matter. These foci are nonspecific, but may be related to very mild chronic small vessel ischemic changes. These are felt to be within normal limits for patient age.   US Renal 07/18/2014   No evidence of hydronephrosis or other renal abnormality.    Medical Consultants:  Nephrology  Other Consultants:  None  IAnti-Infectives:   None  Faye Ramsay, MD  [facility] Pager [phone]   If 7PM-7AM, please contact night-coverage [email or web address] Password [email or web address] 07/28/2014, 7:58 AM   LOS: 11 days  HPI/Subjective: No events overnight. Feels better this AM, still with nausea.   Objective: Filed Vitals:   07/27/14 1630 07/27/14 1824 07/27/14 2116 07/28/14 0413  BP: 151/82 137/65 166/83 138/82  Pulse: 85 87 83 81  Temp:  98.7 F (37.1 C) 98.5 F (36.9 C) 98.3 F (36.8 C)  TempSrc:  Oral Oral Oral  Resp:  18 17 16   Height:      Weight:   65.1 kg (143 lb 8.3 oz)   SpO2:  99% 100% 97%    Intake/Output Summary (Last 24  hours) at 07/28/14 0758 Last data filed at 07/28/14 [id number]  Gross per 24 hour  Intake    960 ml  Output    400 ml  Net    560 ml    Exam:   General:  Pt is more alert, NAD   Cardiovascular: Regular rate and rhythm, no rubs, no gallops, SEM 2/6  Respiratory:  no wheezing, diminished breath sounds   Abdomen: Soft, non tender, non distended, bowel sounds present, no guarding  Extremities: Trace bilateral LE edema, pulses DP and PT palpable bilaterally  Neuro: Grossly nonfocal  Data Reviewed: Basic Metabolic Panel:  Recent Labs Lab 07/25/14 0801 07/26/14 0528 07/27/14 0330 07/27/14 1440 07/28/14 0510  NA 127* 128* 124* 120* 125*  K 3.9 3.7 3.9 3.9 3.2*  CL 89* 92* 89* 84* 92*  CO2 23 25 24 25 24   GLUCOSE 116* 109* 104* 136* 89  BUN 50* 31* 38* 41* 25*  CREATININE 5.85* 4.59* 5.28* 5.62* 4.18*  CALCIUM 8.3* 8.2* 8.5* 8.4* 8.0*  PHOS 7.7* 3.7 2.9 2.4* 2.3*   Liver Function Tests:  Recent Labs Lab 07/25/14 0801 07/26/14 0528 07/27/14 0330 07/27/14 1440 07/28/14 0510  ALBUMIN 3.5 3.2* 3.2* 3.2* 3.1*    CBC:  Recent Labs Lab 07/23/14 0756  07/25/14 0801 07/26/14 0528 07/27/14 0332 07/27/14 1440 07/28/14 0510  WBC 10.2  < > 10.6* 11.4* 12.8* 11.6* 10.7*  NEUTROABS 7.1  --  9.1*  --   --   --   --   HGB 8.5*  < > 8.0* 7.9* 8.1* 8.0* 7.7*  HCT 25.1*  < > 23.4* 23.6* 24.3* 24.3* 24.0*  MCV 89.6  < > 89.7 91.8 91.7 91.7 94.1  PLT 188  < > 197 187 207 219 208  < > = values in this interval not displayed. CBG:  Recent Labs Lab 07/22/14 2056  GLUCAP 131*    No results found for this or any previous visit (from the past 240 hour(s)).   Scheduled Meds: . amLODipine  10 mg Oral QHS  . bisacodyl  10 mg Rectal BID  . calcium acetate  667 mg Oral TID WC  . cloNIDine  0.1 mg Oral TID  . darbepoetin (ARANESP) injection - DIALYSIS  200 mcg Intravenous Q Wed-HD  . feeding supplement (NEPRO CARB STEADY)  237 mL Oral BID BM  . hydrALAZINE  50 mg Oral 3 times  per day  . multivitamin  1 tablet Oral QHS  . neomycin-polymyxin b-dexamethasone  2 drop Right Eye Q12H  . pantoprazole  20 mg Oral Daily  . polyethylene glycol  17 g Oral BID  . potassium chloride  40 mEq Oral Once  . predniSONE  20 mg Oral QAC breakfast  . senna-docusate  1 tablet Oral BID  . sodium chloride  3 mL Intravenous Q12H   Continuous Infusions: . sodium chloride 10 mL/hr at 07/18/14 1400  . sodium chloride

## 2014-07-29 LAB — RENAL FUNCTION PANEL
Albumin: 3.3 g/dL — ABNORMAL LOW (ref 3.5–5.0)
Anion gap: 9 (ref 5–15)
BUN: 32 mg/dL — ABNORMAL HIGH (ref 6–20)
CALCIUM: 8.5 mg/dL — AB (ref 8.9–10.3)
CHLORIDE: 93 mmol/L — AB (ref 101–111)
CO2: 23 mmol/L (ref 22–32)
Creatinine, Ser: 5.39 mg/dL — ABNORMAL HIGH (ref 0.44–1.00)
GFR calc Af Amer: 9 mL/min — ABNORMAL LOW (ref >60)
GFR calc non Af Amer: 8 mL/min — ABNORMAL LOW (ref >60)
GLUCOSE: 95 mg/dL (ref 65–99)
PHOSPHORUS: 2 mg/dL — AB (ref 2.5–4.6)
Potassium: 3.8 mmol/L (ref 3.5–5.1)
Sodium: 125 mmol/L — ABNORMAL LOW (ref 135–145)

## 2014-07-29 LAB — CBC
HEMATOCRIT: 25.5 % — AB (ref 36.0–46.0)
HEMOGLOBIN: 8.4 g/dL — AB (ref 12.0–15.0)
MCH: 31.5 pg (ref 26.0–34.0)
MCHC: 32.9 g/dL (ref 30.0–36.0)
MCV: 95.5 fL (ref 78.0–100.0)
PLATELETS: 222 10*3/uL (ref 150–400)
RBC: 2.67 MIL/uL — AB (ref 3.87–5.11)
RDW: 17.5 % — ABNORMAL HIGH (ref 11.5–15.5)
WBC: 13.2 10*3/uL — ABNORMAL HIGH (ref 4.0–10.5)

## 2014-07-29 MED ORDER — POTASSIUM CHLORIDE CRYS ER 20 MEQ PO TBCR
40.0000 meq | EXTENDED_RELEASE_TABLET | Freq: Once | ORAL | Status: DC
Start: 2014-07-29 — End: 2014-07-29

## 2014-07-29 NOTE — Progress Notes (Signed)
Patient ID: Sibley Gottschall, female   DOB: March 30, 1955, 59 y.o.   MRN: [id number]  [facility] PROGRESS NOTE  Yleana Bola [id number] DOB: 23-Feb-1955 DOA: 07/17/2014 PCP: Nicola Girt, DO   Brief narrative:    59 y.o. female w/ a history of RA and osteopenia who presented with 3-4 months of edema and generalized joint pain. Patient was diagnosed with RA in May and was tx w/ prednisone initially and then was started on methotrexate. Due to rising Cr and anemia the methotrexate was stopped with last dose being almost 2 weeks prior to this admission.   In the ED CT of the head was unremarkable. Cr was up to 3.78, Hg 9.4 WBC 10.4 Of note 2 weeks prior she traveled to [place] where she had fell and broke her right arm. She was seen by Orthopedics and is wearing a splint.   Assessment/Plan:    Nausea and vomiting - still with nause  - has had BM yesterday and today  - continue to provide senna and add bisacodyl supp   Acute renal failure - renal biopsy with thrombotic microangiopathy, ? TTP (ADAMTS13 deficiency), HUS, immune mediated or toxin mediated - ? malignant HTN - following tests still pending: ADAMTS14, LDH, haptoglobin - continue HD per nephrology team, assistance is appreciated  - session on 7/21, 7/23, no HD today planned   Leukocytosis - WBC is now trending up - unclear etiology, pt is afebrile - no specific infectious etiology noted   Rheumatoid arthritis  - Continue prednisone 20 mg daily  - holding MTX  - appears reasonably stable at present  - no focal joint complaints  Acute encephalopathy - resolved   Normocytic anemia - Iron studies suggest iron deficiency  - dosed with Feraheme 07/20/14 - Hg remains overall stable with no signs of active bleeding  Hypervolemic Hyponatremia - Due to acute renal failure - Na ~ 125   Hypokalemia - WNL this AM - daily renal panel   Hypertensive urgency  - currently on Norvasc, Hydralazine,  Clonidine  - Patient had been on hydralazine prior to admission > anti-histone antibody negative - Echocardiogram notes no impairment in systolic fxn or WMA - Nephrology adjusted medical therapy 7/18   Persistent waxing/waning HA  - better this AM but feels lightheaded - will ask for orthostatic vitals   Inadequate oral intake in the setting of acute illness  - nutritionist consulted, recommendations appreciated   DVT prophylaxis - SCD's  Code Status: Full.  Family Communication:  plan of care discussed with the patient and family  Disposition Plan: Home when stable.   IV access:  Peripheral IV  Procedures and diagnostic studies:    Dg Shoulder Right 07/09/2014  Minimally displaced fracture of the greater tuberosity of the proximal right humerus.    Ct Head Wo Contrast 07/17/2014  Negative head CT.   Mr Brain Wo Contrast 07/18/2014  No acute intracranial process identified. 2. Few scattered subcentimeter T2/FLAIR height matter foci within the supratentorial white matter. These foci are nonspecific, but may be related to very mild chronic small vessel ischemic changes. These are felt to be within normal limits for patient age.   US Renal 07/18/2014   No evidence of hydronephrosis or other renal abnormality.    Medical Consultants:  Nephrology  Other Consultants:  None  IAnti-Infectives:   None  Faye Ramsay, MD  [facility] Pager [phone]   If 7PM-7AM, please contact night-coverage [email or web address] Password [email or web address] 07/29/2014, 1:43 PM   LOS: 12 days  HPI/Subjective: No events overnight. Feels better this AM, still with nausea.   Objective: Filed Vitals:   07/28/14 1520 07/28/14 2043 07/29/14 0420 07/29/14 0900  BP: 137/74 168/77 132/64 139/76  Pulse: 78 85 79 75  Temp: 98.9 F (37.2 C) 97.9 F (36.6 C) 98.3 F (36.8 C) 98.2 F (36.8 C)  TempSrc: Oral Oral Oral Oral  Resp: 18 17 18 18   Height:      Weight:  66 kg (145 lb 8.1 oz)    SpO2: 98% 100% 99% 99%     Intake/Output Summary (Last 24 hours) at 07/29/14 1343 Last data filed at 07/29/14 0900  Gross per 24 hour  Intake    960 ml  Output    350 ml  Net    610 ml    Exam:   General:  Pt is more alert, NAD   Cardiovascular: Regular rate and rhythm, no rubs, no gallops, SEM 2/6  Respiratory:  no wheezing, diminished breath sounds   Abdomen: Soft, non tender, non distended, bowel sounds present, no guarding  Extremities: Trace bilateral LE edema, pulses DP and PT palpable bilaterally  Neuro: Grossly nonfocal  Data Reviewed: Basic Metabolic Panel:  Recent Labs Lab 07/26/14 0528 07/27/14 0330 07/27/14 1440 07/28/14 0510 07/29/14 0820  NA 128* 124* 120* 125* 125*  K 3.7 3.9 3.9 3.2* 3.8  CL 92* 89* 84* 92* 93*  CO2 25 24 25 24 23   GLUCOSE 109* 104* 136* 89 95  BUN 31* 38* 41* 25* 32*  CREATININE 4.59* 5.28* 5.62* 4.18* 5.39*  CALCIUM 8.2* 8.5* 8.4* 8.0* 8.5*  PHOS 3.7 2.9 2.4* 2.3* 2.0*   Liver Function Tests:  Recent Labs Lab 07/26/14 0528 07/27/14 0330 07/27/14 1440 07/28/14 0510 07/29/14 0820  ALBUMIN 3.2* 3.2* 3.2* 3.1* 3.3*    CBC:  Recent Labs Lab 07/23/14 0756  07/25/14 0801 07/26/14 0528 07/27/14 0332 07/27/14 1440 07/28/14 0510 07/29/14 0820  WBC 10.2  < > 10.6* 11.4* 12.8* 11.6* 10.7* 13.2*  NEUTROABS 7.1  --  9.1*  --   --   --   --   --   HGB 8.5*  < > 8.0* 7.9* 8.1* 8.0* 7.7* 8.4*  HCT 25.1*  < > 23.4* 23.6* 24.3* 24.3* 24.0* 25.5*  MCV 89.6  < > 89.7 91.8 91.7 91.7 94.1 95.5  PLT 188  < > 197 187 207 219 208 222  < > = values in this interval not displayed. CBG:  Recent Labs Lab 07/22/14 2056  GLUCAP 131*   Scheduled Meds: . amLODipine  10 mg Oral QHS  . bisacodyl  10 mg Rectal BID  . calcium acetate  667 mg Oral TID WC  . cloNIDine  0.1 mg Oral TID  . darbepoetin (ARANESP) injection - DIALYSIS  200 mcg Intravenous Q Wed-HD  . feeding supplement (NEPRO CARB STEADY)  237 mL Oral BID BM  . hydrALAZINE  50 mg Oral 3 times  per day  . multivitamin  1 tablet Oral QHS  . neomycin-polymyxin b-dexamethasone  2 drop Right Eye Q12H  . pantoprazole  20 mg Oral Daily  . polyethylene glycol  17 g Oral BID  . predniSONE  20 mg Oral QAC breakfast  . senna-docusate  1 tablet Oral BID  . sodium chloride  3 mL Intravenous Q12H   Continuous Infusions: . sodium chloride 10 mL/hr at 07/18/14 1400  . sodium chloride

## 2014-07-29 NOTE — Progress Notes (Signed)
Subjective:  Erin Morgan is a 59 yo female presenting with ARF with unknown etiology. Patient was seen and examined this morning.   Patient denies any complaints. Feels okay overall.   Objective: Filed Vitals:   07/28/14 1520 07/28/14 2043 07/29/14 0420 07/29/14 0900  BP: 137/74 168/77 132/64 139/76  Pulse: 78 85 79 75  Temp: 98.9 F (37.2 C) 97.9 F (36.6 C) 98.3 F (36.8 C) 98.2 F (36.8 C)  TempSrc: Oral Oral Oral Oral  Resp: 18 17 18 18   Height:      Weight:  145 lb 8.1 oz (66 kg)    SpO2: 98% 100% 99% 99%   General: Vital signs reviewed. Patient is well-developed and well-nourished, in no acute distress and cooperative with exam. Tearful on exam.  Cardiovascular: RRR, S1 normal, S2 normal Pulmonary/Chest: Clear to auscultation bilaterally, no wheezes, rales, or rhonchi. Abdominal: Soft, non-tender, non-distended, BS +.   Extremities: Trace lower extremity pitting edema bilaterally, pulses symmetric and intact bilaterally. Skin: Warm, dry and intact. No rashes or erythema.  Intake/Output from previous day: 07/24 0701 - 07/25 0700 In: 1320 [P.O.:1320] Out: 350 [Urine:350] Intake/Output this shift: Total I/O In: 240 [P.O.:240] Out: 200 [Urine:200]  Lab Results:  Recent Labs  07/28/14 0510 07/29/14 0820  WBC 10.7* 13.2*  HGB 7.7* 8.4*  HCT 24.0* 25.5*  PLT 208 222   BMET:   Recent Labs  07/28/14 0510 07/29/14 0820  NA 125* 125*  K 3.2* 3.8  CL 92* 93*  CO2 24 23  GLUCOSE 89 95  BUN 25* 32*  CREATININE 4.18* 5.39*  CALCIUM 8.0* 8.5*   CBG (last 3)  No results for input(s): GLUCAP in the last 72 hours. I have reviewed the patient's current medications. Prior to Admission:  Prescriptions prior to admission  Medication Sig Dispense Refill Last Dose  . CALCIUM PO Take 1 tablet by mouth daily.   Past Week at Unknown time  . Cholecalciferol 1000 UNITS capsule Take 1,000 Units by mouth daily.   Past Week at Unknown time  . estradiol (VAGIFEM) 25 MCG  vaginal tablet Place 25 mcg vaginally 2 (two) times a week.    Past Week at Unknown time  . folic acid (FOLVITE) 1 MG tablet Take 1 mg by mouth daily.   07/16/2014 at Unknown time  . hydrochlorothiazide (MICROZIDE) 12.5 MG capsule Take 12.5 mg by mouth as needed (for fluid).    07/16/2014 at Unknown time  . predniSONE (DELTASONE) 5 MG tablet Take 20 mg by mouth daily.   07/16/2014 at Unknown time  . HYDROcodone-acetaminophen (NORCO) 5-325 MG per tablet Take 1 tablet by mouth every 6 (six) hours as needed for moderate pain. 30 tablet 0    Scheduled: . amLODipine  10 mg Oral QHS  . bisacodyl  10 mg Rectal BID  . calcium acetate  667 mg Oral TID WC  . cloNIDine  0.1 mg Oral TID  . darbepoetin (ARANESP) injection - DIALYSIS  200 mcg Intravenous Q Wed-HD  . feeding supplement (NEPRO CARB STEADY)  237 mL Oral BID BM  . hydrALAZINE  50 mg Oral 3 times per day  . multivitamin  1 tablet Oral QHS  . neomycin-polymyxin b-dexamethasone  2 drop Right Eye Q12H  . pantoprazole  20 mg Oral Daily  . polyethylene glycol  17 g Oral BID  . predniSONE  20 mg Oral QAC breakfast  . senna-docusate  1 tablet Oral BID  . sodium chloride  3 mL Intravenous Q12H  Continuous: . sodium chloride 10 mL/hr at 07/18/14 1400  . sodium chloride     [id number] chloride, sodium chloride, acetaminophen, butalbital-acetaminophen-caffeine, feeding supplement (NEPRO CARB STEADY), heparin, heparin, hydrALAZINE, HYDROcodone-acetaminophen, lidocaine (PF), lidocaine-prilocaine, morphine injection, ondansetron **OR** ondansetron (ZOFRAN) IV, oxyCODONE-acetaminophen, pentafluoroprop-tetrafluoroeth, prochlorperazine  Assessment/Plan:   ARF: Presenting with ARF of unknown etiology. Renal biopsy results showed thrombotic microangiopathy. Further work up has been unremarkable making the most likely etiology hypertension. BUN/Cr 25/4.18 this morning. UOP -350 yesterday. BP well controlled. Last HD on Saturday. -Renal function panel  daily -No HD today  Hypertension: Well controlled  -Continue amlodipine 10 mg daily -Continue clonodine 0.1 mg TID -Continue Hydralazine 50 mg TID    Hyperphosphatemia: Improved, phosphorous 2.3 this morning.  -PhosLo 667 mg TID WC  Iron Deficiency Anemia: Hemoglobin stable around 8.  -Monitor CBC   Hypervolemic Hyponatremia: Sodium this morning 125. Likely secondary to renal failure.  -Will slowly improve with HD  Hypokalemia: Resolved, potassium 3.8 today.   -Continue to trend renal function panel  RA: Holding methotrexate for now.  -Continue prednisone 20 mg daily   LOS: 12 days   Osa Craver, DO PGY-2 Internal Medicine Resident Pager # [phone] 07/29/2014 12:45 PM

## 2014-07-30 LAB — CBC
HCT: 25 % — ABNORMAL LOW (ref 36.0–46.0)
Hemoglobin: 8.3 g/dL — ABNORMAL LOW (ref 12.0–15.0)
MCH: 31.1 pg (ref 26.0–34.0)
MCHC: 33.2 g/dL (ref 30.0–36.0)
MCV: 93.6 fL (ref 78.0–100.0)
Platelets: 247 10*3/uL (ref 150–400)
RBC: 2.67 MIL/uL — ABNORMAL LOW (ref 3.87–5.11)
RDW: 17.3 % — ABNORMAL HIGH (ref 11.5–15.5)
WBC: 11.8 10*3/uL — AB (ref 4.0–10.5)

## 2014-07-30 LAB — RENAL FUNCTION PANEL
Albumin: 3.3 g/dL — ABNORMAL LOW (ref 3.5–5.0)
Anion gap: 10 (ref 5–15)
BUN: 38 mg/dL — ABNORMAL HIGH (ref 6–20)
CO2: 23 mmol/L (ref 22–32)
Calcium: 8.5 mg/dL — ABNORMAL LOW (ref 8.9–10.3)
Chloride: 89 mmol/L — ABNORMAL LOW (ref 101–111)
Creatinine, Ser: 6.24 mg/dL — ABNORMAL HIGH (ref 0.44–1.00)
GFR calc non Af Amer: 7 mL/min — ABNORMAL LOW (ref >60)
GFR, EST AFRICAN AMERICAN: 8 mL/min — AB (ref >60)
Glucose, Bld: 86 mg/dL (ref 65–99)
PHOSPHORUS: 2.6 mg/dL (ref 2.5–4.6)
POTASSIUM: 3.8 mmol/L (ref 3.5–5.1)
Sodium: 122 mmol/L — ABNORMAL LOW (ref 135–145)

## 2014-07-30 NOTE — Progress Notes (Signed)
Patient ID: Zenaya Growney, female   DOB: 07/29/1955, 59 y.o.   MRN: [id number]  [facility] PROGRESS NOTE  Kenya Nitti [id number] DOB: [birth year] DOA: 07/17/2014 PCP: Nicola Girt, DO   Brief narrative:    59 y.o. female w/ a history of RA and osteopenia who presented with 3-4 months of edema and generalized joint pain. Patient was diagnosed with RA in May and was tx w/ prednisone initially and then was started on methotrexate. Due to rising Cr and anemia the methotrexate was stopped with last dose being almost 2 weeks prior to this admission.   In the ED CT of the head was unremarkable. Cr was up to 3.78, Hg 9.4 WBC 10.4 Of note 2 weeks prior she traveled to [place] where she had fell and broke her right arm. She was seen by Orthopedics and is wearing a splint.   Hospital course complicated by persistent kidney insufficiency requiring close volume control with intermittent HD sessions. Nephrology team following.   Assessment/Plan:    Nausea and vomiting - still with nausea this AM but says appetite is better  - has had BM yesterday and today  - continue to provide senna and add bisacodyl supp   Acute renal failure - renal biopsy with thrombotic microangiopathy, ? TTP (ADAMTS13 deficiency), HUS, immune mediated or toxin mediated - ? malignant HTN - following tests still pending: ADAMTS14, LDH, haptoglobin - continue HD per nephrology team, assistance is appreciated  - session on 7/21, 7/23, no HD today planned  - will repeat renal panel in AM  Leukocytosis - WBC is trending down  - unclear etiology, pt is afebrile, possibly related to prednisone use  - no specific infectious etiology noted  - pt has refused XRAY   Rheumatoid arthritis  - Continue prednisone 20 mg daily  - holding MTX  - appears reasonably stable at present  - no focal joint complaints  Acute encephalopathy - resolved   Normocytic anemia - Iron studies suggest iron deficiency  -  dosed with Feraheme 07/20/14 - Hg remains overall stable with no signs of active bleeding  Hypervolemic Hyponatremia - Due to acute renal failure - Na down over the past 24 hours: 125 --> 122  - will repeat renal panel in AM  Hypokalemia - WNL this AM - daily renal panel   Hypertensive urgency  - currently on Norvasc, Hydralazine, Clonidine  - Patient had been on hydralazine prior to admission > anti-histone antibody negative - Echocardiogram notes no impairment in systolic fxn or WMA - Nephrology adjusted medical therapy 7/18 and BP has been reasonably stable so far   Persistent waxing/waning HA  - better this AM but feels lightheaded -- orthostatic vitals negative   Inadequate oral intake in the setting of acute illness  - nutritionist consulted, recommendations appreciated   DVT prophylaxis - SCD's  Code Status: Full.  Family Communication:  plan of care discussed with the patient and family  Disposition Plan: Home when stable.   IV access:  Peripheral IV  Procedures and diagnostic studies:    Dg Shoulder Right 07/09/2014  Minimally displaced fracture of the greater tuberosity of the proximal right humerus.    Ct Head Wo Contrast 07/17/2014  Negative head CT.   Mr Brain Wo Contrast 07/18/2014  No acute intracranial process identified. 2. Few scattered subcentimeter T2/FLAIR height matter foci within the supratentorial white matter. These foci are nonspecific, but may be related to very mild chronic small vessel ischemic changes. These are felt to  be within normal limits for patient age.   US Renal 07/18/2014   No evidence of hydronephrosis or other renal abnormality.    Medical Consultants:  Nephrology  Other Consultants:  None  IAnti-Infectives:   None  Faye Ramsay, MD  [facility] Pager [phone]   If 7PM-7AM, please contact night-coverage [email or web address] Password [facility] 07/30/2014, 3:57 PM   LOS: 13 days   HPI/Subjective: No events overnight. Feels  better this AM, still with nausea.   Objective: Filed Vitals:   07/29/14 1816 07/29/14 2046 07/30/14 0415 07/30/14 1000  BP: 160/76 156/77 140/67 141/65  Pulse: 76 85 72 77  Temp: 98.3 F (36.8 C) 98.5 F (36.9 C) 97.8 F (36.6 C) 97.9 F (36.6 C)  TempSrc: Oral Oral Oral Oral  Resp: 18 20 18 18   Height:      Weight:      SpO2: 96% 98% 97% 96%    Intake/Output Summary (Last 24 hours) at 07/30/14 1557 Last data filed at 07/30/14 1440  Gross per 24 hour  Intake   1214 ml  Output   1000 ml  Net    214 ml    Exam:   General:  Pt is more alert, NAD   Cardiovascular: Regular rate and rhythm, no rubs, no gallops, SEM 2/6  Respiratory:  no wheezing, diminished breath sounds   Abdomen: Soft, non tender, non distended, bowel sounds present, no guarding  Extremities: Trace bilateral LE edema, pulses DP and PT palpable bilaterally  Neuro: Grossly nonfocal  Data Reviewed: Basic Metabolic Panel:  Recent Labs Lab 07/27/14 0330 07/27/14 1440 07/28/14 0510 07/29/14 0820 07/30/14 0442  NA 124* 120* 125* 125* 122*  K 3.9 3.9 3.2* 3.8 3.8  CL 89* 84* 92* 93* 89*  CO2 24 25 24 23 23   GLUCOSE 104* 136* 89 95 86  BUN 38* 41* 25* 32* 38*  CREATININE 5.28* 5.62* 4.18* 5.39* 6.24*  CALCIUM 8.5* 8.4* 8.0* 8.5* 8.5*  PHOS 2.9 2.4* 2.3* 2.0* 2.6   Liver Function Tests:  Recent Labs Lab 07/27/14 0330 07/27/14 1440 07/28/14 0510 07/29/14 0820 07/30/14 0442  ALBUMIN 3.2* 3.2* 3.1* 3.3* 3.3*    CBC:  Recent Labs Lab 07/25/14 0801  07/27/14 0332 07/27/14 1440 07/28/14 0510 07/29/14 0820 07/30/14 0442  WBC 10.6*  < > 12.8* 11.6* 10.7* 13.2* 11.8*  NEUTROABS 9.1*  --   --   --   --   --   --   HGB 8.0*  < > 8.1* 8.0* 7.7* 8.4* 8.3*  HCT 23.4*  < > 24.3* 24.3* 24.0* 25.5* 25.0*  MCV 89.7  < > 91.7 91.7 94.1 95.5 93.6  PLT 197  < > 207 219 208 222 247  < > = values in this interval not displayed. CBG: No results for input(s): GLUCAP in the last 168  hours. Scheduled Meds: . amLODipine  10 mg Oral QHS  . bisacodyl  10 mg Rectal BID  . calcium acetate  667 mg Oral TID WC  . cloNIDine  0.1 mg Oral TID  . darbepoetin (ARANESP) injection - DIALYSIS  200 mcg Intravenous Q Wed-HD  . feeding supplement (NEPRO CARB STEADY)  237 mL Oral BID BM  . hydrALAZINE  50 mg Oral 3 times per day  . multivitamin  1 tablet Oral QHS  . neomycin-polymyxin b-dexamethasone  2 drop Right Eye Q12H  . pantoprazole  20 mg Oral Daily  . predniSONE  20 mg Oral QAC breakfast  . senna-docusate  1 tablet Oral  BID  . sodium chloride  3 mL Intravenous Q12H   Continuous Infusions: . sodium chloride 10 mL/hr at 07/18/14 1400  . sodium chloride

## 2014-07-30 NOTE — Progress Notes (Signed)
Subjective:  Erin Morgan is a 59 yo female presenting with ARF with unknown etiology. Patient was seen and examined this morning. Patient feels well overall. Admits to some fatigue.   Objective: Filed Vitals:   07/29/14 1816 07/29/14 2046 07/30/14 0415 07/30/14 1000  BP: 160/76 156/77 140/67 141/65  Pulse: 76 85 72 77  Temp: 98.3 F (36.8 C) 98.5 F (36.9 C) 97.8 F (36.6 C) 97.9 F (36.6 C)  TempSrc: Oral Oral Oral Oral  Resp: 18 20 18 18   Height:      Weight:      SpO2: 96% 98% 97% 96%   General: Vital signs reviewed. Patient is well-developed and well-nourished, in no acute distress and cooperative with exam.  Cardiovascular: RRR, S1 normal, S2 normal Pulmonary/Chest: Clear to auscultation bilaterally, no wheezes, rales, or rhonchi. Abdominal: Soft, non-tender, non-distended, BS +.   Extremities: Trace lower extremity pitting edema bilaterally, pulses symmetric and intact bilaterally. Skin: Warm, dry and intact. No rashes or erythema.  Intake/Output from previous day: 07/25 0701 - 07/26 0700 In: 1164 [P.O.:1164] Out: 600 [Urine:600] Intake/Output this shift: Total I/O In: 290 [P.O.:290] Out: 350 [Urine:350]  Lab Results:  Recent Labs  07/29/14 0820 07/30/14 0442  WBC 13.2* 11.8*  HGB 8.4* 8.3*  HCT 25.5* 25.0*  PLT 222 247   BMET:   Recent Labs  07/29/14 0820 07/30/14 0442  NA 125* 122*  K 3.8 3.8  CL 93* 89*  CO2 23 23  GLUCOSE 95 86  BUN 32* 38*  CREATININE 5.39* 6.24*  CALCIUM 8.5* 8.5*   I have reviewed the patient's current medications. Prior to Admission:  Prescriptions prior to admission  Medication Sig Dispense Refill Last Dose  . CALCIUM PO Take 1 tablet by mouth daily.   Past Week at Unknown time  . Cholecalciferol 1000 UNITS capsule Take 1,000 Units by mouth daily.   Past Week at Unknown time  . estradiol (VAGIFEM) 25 MCG vaginal tablet Place 25 mcg vaginally 2 (two) times a week.    Past Week at Unknown time  . folic acid  (FOLVITE) 1 MG tablet Take 1 mg by mouth daily.   07/16/2014 at Unknown time  . hydrochlorothiazide (MICROZIDE) 12.5 MG capsule Take 12.5 mg by mouth as needed (for fluid).    07/16/2014 at Unknown time  . predniSONE (DELTASONE) 5 MG tablet Take 20 mg by mouth daily.   07/16/2014 at Unknown time  . HYDROcodone-acetaminophen (NORCO) 5-325 MG per tablet Take 1 tablet by mouth every 6 (six) hours as needed for moderate pain. 30 tablet 0    Scheduled: . amLODipine  10 mg Oral QHS  . bisacodyl  10 mg Rectal BID  . calcium acetate  667 mg Oral TID WC  . cloNIDine  0.1 mg Oral TID  . darbepoetin (ARANESP) injection - DIALYSIS  200 mcg Intravenous Q Wed-HD  . feeding supplement (NEPRO CARB STEADY)  237 mL Oral BID BM  . hydrALAZINE  50 mg Oral 3 times per day  . multivitamin  1 tablet Oral QHS  . neomycin-polymyxin b-dexamethasone  2 drop Right Eye Q12H  . pantoprazole  20 mg Oral Daily  . predniSONE  20 mg Oral QAC breakfast  . senna-docusate  1 tablet Oral BID  . sodium chloride  3 mL Intravenous Q12H   Continuous: . sodium chloride 10 mL/hr at 07/18/14 1400  . sodium chloride     [id number] chloride, sodium chloride, acetaminophen, butalbital-acetaminophen-caffeine, feeding supplement (NEPRO CARB STEADY), heparin, heparin, hydrALAZINE,  HYDROcodone-acetaminophen, lidocaine (PF), lidocaine-prilocaine, morphine injection, ondansetron **OR** ondansetron (ZOFRAN) IV, oxyCODONE-acetaminophen, pentafluoroprop-tetrafluoroeth, prochlorperazine  Assessment/Plan:   ARF: Presenting with ARF of unknown etiology. Renal biopsy results showed thrombotic microangiopathy. Further work up has been unremarkable making the most likely etiology hypertension. BUN/Cr continues to rise to 38/6.24 this morning. UOP -600 yesterday. BP well controlled. Last HD on Saturday. No indications for emergent HD at this time. We will continue to monitor renal function off HD for the next day or so to see if Cr stabilizes and begins  to trend down.  -Renal function panel daily -Continue to monitor off HD -Continue to monitor UOP  Hypertension: Well controlled. -Continue amlodipine 10 mg daily -Continue clonodine 0.1 mg TID -Continue Hydralazine 50 mg TID    Hyperphosphatemia: Resolved, phosphorous 2.6 this morning.  -PhosLo 667 mg TID WC  Iron Deficiency Anemia: Hemoglobin stable around 8.  -Monitor CBC intermittently  Hypervolemic Hyponatremia: Sodium this morning 122. Likely secondary to renal failure.  -Will slowly improve with HD  RA: Holding methotrexate for now.  -Continue prednisone 20 mg daily   LOS: 13 days   Erin Craver, DO PGY-2 Internal Medicine Resident Pager # [phone] 07/30/2014 10:56 AM

## 2014-07-31 LAB — RENAL FUNCTION PANEL
Albumin: 3.1 g/dL — ABNORMAL LOW (ref 3.5–5.0)
Anion gap: 11 (ref 5–15)
BUN: 45 mg/dL — ABNORMAL HIGH (ref 6–20)
CHLORIDE: 88 mmol/L — AB (ref 101–111)
CO2: 23 mmol/L (ref 22–32)
Calcium: 8.7 mg/dL — ABNORMAL LOW (ref 8.9–10.3)
Creatinine, Ser: 6.99 mg/dL — ABNORMAL HIGH (ref 0.44–1.00)
GFR, EST AFRICAN AMERICAN: 7 mL/min — AB (ref >60)
GFR, EST NON AFRICAN AMERICAN: 6 mL/min — AB (ref >60)
GLUCOSE: 88 mg/dL (ref 65–99)
POTASSIUM: 3.9 mmol/L (ref 3.5–5.1)
Phosphorus: 3.1 mg/dL (ref 2.5–4.6)
SODIUM: 122 mmol/L — AB (ref 135–145)

## 2014-07-31 LAB — CBC
HCT: 26 % — ABNORMAL LOW (ref 36.0–46.0)
Hemoglobin: 8.6 g/dL — ABNORMAL LOW (ref 12.0–15.0)
MCH: 30.8 pg (ref 26.0–34.0)
MCHC: 33.1 g/dL (ref 30.0–36.0)
MCV: 93.2 fL (ref 78.0–100.0)
Platelets: 253 10*3/uL (ref 150–400)
RBC: 2.79 MIL/uL — ABNORMAL LOW (ref 3.87–5.11)
RDW: 17.2 % — ABNORMAL HIGH (ref 11.5–15.5)
WBC: 11.8 10*3/uL — AB (ref 4.0–10.5)

## 2014-07-31 MED ORDER — DARBEPOETIN ALFA 200 MCG/0.4ML IJ SOSY
200.0000 ug | PREFILLED_SYRINGE | INTRAMUSCULAR | Status: DC
  Administered 2014-08-01 – 2014-08-08 (×2): 200 ug via INTRAVENOUS
  Filled 2014-07-31 (×2): qty 0.4

## 2014-07-31 NOTE — Progress Notes (Signed)
Subjective:  Erin Morgan is a 59 yo female presenting with ARF with unknown etiology. Patient was seen and examined this morning. Patient complains of nausea with some vomiting and headache. She is very discourage and frustrated with her current situation.   Objective: Filed Vitals:   07/30/14 1000 07/30/14 2003 07/31/14 0514 07/31/14 0834  BP: 141/65 144/70 139/68 157/79  Pulse: 77 73 75 83  Temp: 97.9 F (36.6 C) 98 F (36.7 C) 98 F (36.7 C) 97.8 F (36.6 C)  TempSrc: Oral Oral Oral Oral  Resp: 18 16 18 18   Height:      Weight:  143 lb 12.8 oz (65.227 kg)    SpO2: 96% 98% 97% 98%   General: Vital signs reviewed. Patient is in no acute distress and cooperative with exam.  Cardiovascular: RRR, S1 normal, S2 normal Pulmonary/Chest: Clear to auscultation bilaterally, no wheezes, rales, or rhonchi. Abdominal: Soft, non-tender, non-distended, BS +.   Extremities: 1-2+ lower extremity pitting edema bilaterally, pulses symmetric and intact bilaterally. Skin: Warm, dry and intact. No rashes or erythema.  Intake/Output from previous day: 07/26 0701 - 07/27 0700 In: 752 [P.O.:752] Out: 1100 [Urine:1100] Intake/Output this shift:    Lab Results:  Recent Labs  07/30/14 0442 07/31/14 0501  WBC 11.8* 11.8*  HGB 8.3* 8.6*  HCT 25.0* 26.0*  PLT 247 253   BMET:   Recent Labs  07/30/14 0442 07/31/14 0501  NA 122* 122*  K 3.8 3.9  CL 89* 88*  CO2 23 23  GLUCOSE 86 88  BUN 38* 45*  CREATININE 6.24* 6.99*  CALCIUM 8.5* 8.7*   I have reviewed the patient's current medications. Prior to Admission:  Prescriptions prior to admission  Medication Sig Dispense Refill Last Dose  . CALCIUM PO Take 1 tablet by mouth daily.   Past Week at Unknown time  . Cholecalciferol 1000 UNITS capsule Take 1,000 Units by mouth daily.   Past Week at Unknown time  . estradiol (VAGIFEM) 25 MCG vaginal tablet Place 25 mcg vaginally 2 (two) times a week.    Past Week at Unknown time  . folic  acid (FOLVITE) 1 MG tablet Take 1 mg by mouth daily.   07/16/2014 at Unknown time  . hydrochlorothiazide (MICROZIDE) 12.5 MG capsule Take 12.5 mg by mouth as needed (for fluid).    07/16/2014 at Unknown time  . predniSONE (DELTASONE) 5 MG tablet Take 20 mg by mouth daily.   07/16/2014 at Unknown time  . HYDROcodone-acetaminophen (NORCO) 5-325 MG per tablet Take 1 tablet by mouth every 6 (six) hours as needed for moderate pain. 30 tablet 0    Scheduled: . amLODipine  10 mg Oral QHS  . bisacodyl  10 mg Rectal BID  . calcium acetate  667 mg Oral TID WC  . cloNIDine  0.1 mg Oral TID  . darbepoetin (ARANESP) injection - DIALYSIS  200 mcg Intravenous Q Wed-HD  . feeding supplement (NEPRO CARB STEADY)  237 mL Oral BID BM  . hydrALAZINE  50 mg Oral 3 times per day  . multivitamin  1 tablet Oral QHS  . neomycin-polymyxin b-dexamethasone  2 drop Right Eye Q12H  . pantoprazole  20 mg Oral Daily  . predniSONE  20 mg Oral QAC breakfast  . senna-docusate  1 tablet Oral BID  . sodium chloride  3 mL Intravenous Q12H   Continuous: . sodium chloride 10 mL/hr at 07/18/14 1400  . sodium chloride     [id number] chloride, sodium chloride, acetaminophen, butalbital-acetaminophen-caffeine, feeding  supplement (NEPRO CARB STEADY), heparin, heparin, hydrALAZINE, HYDROcodone-acetaminophen, lidocaine (PF), lidocaine-prilocaine, morphine injection, ondansetron **OR** ondansetron (ZOFRAN) IV, oxyCODONE-acetaminophen, pentafluoroprop-tetrafluoroeth, prochlorperazine  Assessment/Plan:   ARF: Presenting with ARF of unknown etiology. Renal biopsy results showed thrombotic microangiopathy. Further work up has been unremarkable making the most likely etiology hypertension. BUN/Cr continues to rise to 45/6.99 this morning. UOP -1100 yesterday. BP well controlled. Last HD on Saturday. Unfortunately, it is very unlikely that her renal function is going to improve. She will likely need to be started back on HD. After discussion  with patient, we will hold off for one more day to see if creatinine improves. If creatinine worsens, we will initiate HD tomorrow.  -Renal function panel daily -Continue to monitor UOP  Hypertension: Well controlled. -Continue amlodipine 10 mg daily -Continue clonodine 0.1 mg TID -Continue Hydralazine 50 mg TID    Hyperphosphatemia: Resolved, phosphorous 3.1 this morning.  -PhosLo 667 mg TID WC  Iron Deficiency Anemia: Hemoglobin stable around 8.  -Monitor CBC intermittently  Hypervolemic Hyponatremia: Sodium this morning 122. Likely secondary to renal failure.  -Fluid restrict  RA: Holding methotrexate for now.  -Continue prednisone 20 mg daily   LOS: 14 days   Osa Craver, DO PGY-2 Internal Medicine Resident Pager # [phone] 07/31/2014 11:46 AM

## 2014-07-31 NOTE — Progress Notes (Signed)
[facility]                                                                              Patient Demographics  Erin Morgan, is a 59 y.o. female, DOB - 11-22-1955, QB:[id number]  Admit date - 07/17/2014   Admitting Physician Toy Baker, MD  Outpatient Primary MD for the patient is Nicola Girt, DO  LOS - 14   Chief Complaint  Patient presents with  . Headache  . Nausea  . Hypertension      HPI on 07/17/2014 by Dr. Toy Baker Presented with 3-4 months edema and generalized joint pain.  Patient was diagnosed with RA since MAY she was prednisone at first and then was started on methotrexate. But her blood work was abnormal with rising Cr and anemia. He methotrexate was stopped with last dose being almost 2 weeks ago. She have had a nausea for few weeks and continued to have significant edema. Her rheumatologist started her on HCTZ to help with swelling. She have had a headache off and on for few weeks but gotten persistently worse. Now it became constant. She had to wake up in the night to take additional tylenol. Presented to PCP and had blood work today showing WBC of 15. She was instructed to come to ER. CT of the head was unremarkable. CR was noted to be up to 3.78, HG 9.4 WBC on repeat was down to 10.4 Denies any fever or chills. 2 weeks ago she traveled to [place] where she had fallen and broke her right arm. She was seen by orthopedics and is wearing a splint.  She reports decreased PO intake today, have had decreased urine output last urination was few hours ago and reports no change in color. 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   Assessment & Plan   Nausea and vomiting - still with nausea and vomiting, feels zofran helps - continue to provide senna and add bisacodyl supp   Acute renal failure - renal biopsy with thrombotic microangiopathy, ? TTP (ADAMTS13  deficiency), HUS, immune mediated or toxin mediated - ? malignant HTN - following tests still pending: ADAMTS14, LDH, haptoglobin - continue HD per nephrology team, assistance is appreciated  - session on 7/21, 7/23 - Hope that Creatinine has peaked, will continue to monitor Renal panel  Leukocytosis - WBC is trending down  - unclear etiology, pt is afebrile, possibly reactive and related to prednisone use  - no specific infectious etiology noted  - pt has refused XRAY   Rheumatoid arthritis  - Continue prednisone 20 mg daily  - holding MTX  - appears reasonably stable at present  - no focal joint complaints  Acute encephalopathy - resolved   Normocytic anemia - Iron studies suggest iron deficiency  - dosed with Feraheme 07/20/14 - Hg remains overall stable with no signs of active bleeding, currently 8.6  Hypervolemic Hyponatremia - Due to acute renal failure - Na down over the past 24 hours: 125 --> 122  - Continue to monitor BMP  Hypokalemia - WNL this AM - daily renal panel   Hypertensive urgency  - currently  on Norvasc, Hydralazine, Clonidine  - Patient had been on hydralazine prior to admission > anti-histone antibody negative - Echocardiogram notes no impairment in systolic fxn or WMA - Nephrology adjusted medical therapy 7/18 and BP has been reasonably stable so far   Persistent waxing/waning HA  - better this AM but feels lightheaded - orthostatic vitals negative   Inadequate oral intake in the setting of acute illness  - nutritionist consulted, recommendations appreciated   Code Status: Full  Family Communication: Parents at bedside  Disposition Plan: Admitted.  Continue to monitor Cr.   Time Spent in minutes   30 minutes  Procedures  Renal Bx  Consults   Nephrology  DVT Prophylaxis  SCDs  Lab Results  Component Value Date   PLT 253 07/31/2014    Medications  Scheduled Meds: . amLODipine  10 mg Oral QHS  . bisacodyl  10  mg Rectal BID  . calcium acetate  667 mg Oral TID WC  . cloNIDine  0.1 mg Oral TID  . [START ON 08/01/2014] darbepoetin (ARANESP) injection - DIALYSIS  200 mcg Intravenous Q Thu-HD  . feeding supplement (NEPRO CARB STEADY)  237 mL Oral BID BM  . hydrALAZINE  50 mg Oral 3 times per day  . multivitamin  1 tablet Oral QHS  . neomycin-polymyxin b-dexamethasone  2 drop Right Eye Q12H  . pantoprazole  20 mg Oral Daily  . predniSONE  20 mg Oral QAC breakfast  . senna-docusate  1 tablet Oral BID  . sodium chloride  3 mL Intravenous Q12H   Continuous Infusions: . sodium chloride 10 mL/hr at 07/18/14 1400  . sodium chloride     PRN Meds:.sodium chloride, sodium chloride, acetaminophen, butalbital-acetaminophen-caffeine, feeding supplement (NEPRO CARB STEADY), heparin, heparin, hydrALAZINE, HYDROcodone-acetaminophen, lidocaine (PF), lidocaine-prilocaine, morphine injection, ondansetron **OR** ondansetron (ZOFRAN) IV, oxyCODONE-acetaminophen, pentafluoroprop-tetrafluoroeth, prochlorperazine  Antibiotics    Anti-infectives    Start     Dose/Rate Route Frequency Ordered Stop   07/24/14 1000  ceFAZolin (ANCEF) IVPB 2 g/50 mL premix    Comments:  Hang ON CALL to xray 7/20   2 g 100 mL/hr over 30 Minutes Intravenous On call 07/24/14 0908 07/24/14 1503      Subjective:   [name] seen and examined today.  Patient continues to complain of headache, nausea and vomiting. Feels Zofran helps. Denies any chest pain or shortness of breath at this time..    Objective:   Filed Vitals:   07/31/14 0514 07/31/14 0834 07/31/14 1253 07/31/14 1444  BP: 139/68 157/79 162/72 153/77  Pulse: 75 83    Temp: 98 F (36.7 C) 97.8 F (36.6 C)    TempSrc: Oral Oral    Resp: 18 18    Height:      Weight:      SpO2: 97% 98%      Wt Readings from Last 3 Encounters:  07/30/14 65.227 kg (143 lb 12.8 oz)  04/05/14 62.143 kg (137 lb)  05/21/12 59.875 kg (132 lb)     Intake/Output Summary (Last 24  hours) at 07/31/14 1508 Last data filed at 07/31/14 1300  Gross per 24 hour  Intake    282 ml  Output    650 ml  Net   -368 ml    Exam  General: Well developed, well nourished, NAD, appears stated age  HEENT: NCAT, mucous membranes moist.   Cardiovascular: S1 S2 auscultated, no rubs, murmurs or gallops. Regular rate and rhythm.  Respiratory: Clear to auscultation bilaterally with equal chest  rise  Abdomen: Soft, nontender, nondistended, + bowel sounds  Extremities: warm dry without cyanosis clubbing. 2+ edema LE B/L   Neuro: AAOx3, nonfocal    Data Review   Micro Results No results found for this or any previous visit (from the past 240 hour(s)).  Radiology Reports Dg Shoulder Right  07/09/2014   CLINICAL DATA:  Right shoulder injury.  Fall.  EXAM: RIGHT SHOULDER - 2+ VIEW  COMPARISON:  None.  FINDINGS: Fracture of the greater tuberosity of the proximal right humerus is present. No other abnormality identified. No evidence of dislocation or separation.  IMPRESSION: Minimally displaced fracture of the greater tuberosity of the proximal right humerus.   Electronically Signed   By: Marcello Moores  Register   On: 07/09/2014 13:58   Ct Head Wo Contrast  07/17/2014   CLINICAL DATA:  59 year old female with headache, nausea and leukocytosis  EXAM: CT HEAD WITHOUT CONTRAST  TECHNIQUE: Contiguous axial images were obtained from the base of the skull through the vertex without intravenous contrast.  COMPARISON:  None.  FINDINGS: Negative for acute intracranial hemorrhage, acute infarction, mass, mass effect, hydrocephalus or midline shift. Gray-white differentiation is preserved throughout. No acute soft tissue or calvarial abnormality. The globes and orbits are symmetric and unremarkable. Normal aeration of the mastoid air cells and visualized paranasal sinuses. Small sclerotic focus in the right frontal calvarium just above the orbit likely represents a benign bone island.  IMPRESSION: Negative  head CT.   Electronically Signed   By: Jacqulynn Cadet M.D.   On: 07/17/2014 17:04   Mr Brain Wo Contrast  07/18/2014   CLINICAL DATA:  Initial evaluation for acute headache.  EXAM: MRI HEAD WITHOUT CONTRAST  TECHNIQUE: Multiplanar, multiecho pulse sequences of the brain and surrounding structures were obtained without intravenous contrast.  COMPARISON:  Prior CT from 07/17/2014  FINDINGS: The CSF containing spaces are within normal limits for patient age. Few scattered T2/FLAIR hyperintense foci present within the periventricular and deep white matter of both cerebral hemispheres, nonspecific. No mass lesion, midline shift, or extra-axial fluid collection. Ventricles are normal in size without evidence of hydrocephalus.  No diffusion-weighted signal abnormality is identified to suggest acute intracranial infarct. Gray-white matter differentiation is maintained. Normal flow voids are seen within the intracranial vasculature. No intracranial hemorrhage identified.  The cervicomedullary junction is normal. Pituitary gland is within normal limits. Pituitary stalk is midline. The globes and optic nerves demonstrate a normal appearance with normal signal intensity. The  The bone marrow signal intensity is normal. Calvarium is intact. Visualized upper cervical spine is within normal limits.  Scalp soft tissues are unremarkable.  Paranasal sinuses are clear.  No mastoid effusion.  IMPRESSION: 1. No acute intracranial process identified. 2. Few scattered subcentimeter T2/FLAIR height matter foci within the supratentorial white matter. These foci are nonspecific, but may be related to very mild chronic small vessel ischemic changes. These are felt to be within normal limits for patient age. 3. Otherwise normal brain MRI   Electronically Signed   By: Jeannine Boga M.D.   On: 07/18/2014 06:53   US Renal  07/18/2014   CLINICAL DATA:  Acute renal failure.  EXAM: RENAL / URINARY TRACT ULTRASOUND COMPLETE   COMPARISON:  None.  FINDINGS: Right Kidney:  Length: 10.0 cm. Echogenicity within normal limits. No mass or hydronephrosis visualized.  Left Kidney:  Length: 9.4 cm. Echogenicity within normal limits. No mass or hydronephrosis visualized.  Bladder:  Appears normal for degree of bladder distention.  Small bilateral pleural  effusions incidentally noted.  IMPRESSION: No evidence of hydronephrosis or other renal abnormality.  Small bilateral pleural effusions incidentally noted.   Electronically Signed   By: Earle Gell M.D.   On: 07/18/2014 11:55   US Biopsy  07/23/2014   CLINICAL DATA:  Renal insufficiency  EXAM: ULTRASOUND BIOPSY  COMPARISON:  None.  FINDINGS: Imaging was provided to the Nephrology service for renal biopsy.  IMPRESSION: See above.   Electronically Signed   By: Marybelle Killings M.D.   On: 07/23/2014 13:26   Ir Fluoro Guide Cv Line Right  07/24/2014   CLINICAL DATA:  Acute renal failure, needs access for hemodialysis  EXAM: TUNNELED HEMODIALYSIS CATHETER PLACEMENT WITH ULTRASOUND AND FLUOROSCOPIC GUIDANCE  TECHNIQUE: The procedure, risks, benefits, and alternatives were explained to the patient. Questions regarding the procedure were encouraged and answered. The patient understands and consents to the procedure. As antibiotic prophylaxis, cefazolin 2 g was ordered pre-procedure and administered intravenously within one hour of incision.Patency of the right IJ vein was confirmed with ultrasound with image documentation. An appropriate skin site was determined. Region was prepped using maximum barrier technique including cap and mask, sterile gown, sterile gloves, large sterile sheet, and Chlorhexidine as cutaneous antisepsis. The region was infiltrated locally with 1% lidocaine.  Intravenous Fentanyl and Versed were administered as conscious sedation during continuous cardiorespiratory monitoring by the radiology RN, with a total moderate sedation time of 10 minutes.  Under real-time ultrasound  guidance, the right IJ vein was accessed with a 21 gauge micropuncture needle; the needle tip within the vein was confirmed with ultrasound image documentation. Needle exchanged over the 018 guidewire for transitional dilator, which allowed advancement of a Benson wire into the IVC. Over this, an MPA catheter was advanced. A Palindrome 19 hemodialysis catheter was tunneled from the right anterior chest wall approach to the right IJ dermatotomy site. The MPA catheter was exchanged over an Amplatz wire for serial vascular dilators which allow placement of a peel-away sheath, through which the catheter was advanced under intermittent fluoroscopy, positioned with its tips in the proximal and midright atrium. Spot chest radiograph confirms good catheter position. No pneumothorax. Catheter was flushed and primed per protocol. Catheter secured externally with O Prolene suture. The right IJ dermatotomy site was closed with Dermabond.  COMPLICATIONS: COMPLICATIONS None immediate  FLUOROSCOPY TIME:  12 seconds, 3.7 mGy  COMPARISON:  None  IMPRESSION: 1. Technically successful placement of tunneled right IJ hemodialysis catheter with ultrasound and fluoroscopic guidance. Ready for routine use.   Electronically Signed   By: Lucrezia Europe M.D.   On: 07/24/2014 15:16   Ir US Guide Vasc Access Right  07/24/2014   CLINICAL DATA:  Acute renal failure, needs access for hemodialysis  EXAM: TUNNELED HEMODIALYSIS CATHETER PLACEMENT WITH ULTRASOUND AND FLUOROSCOPIC GUIDANCE  TECHNIQUE: The procedure, risks, benefits, and alternatives were explained to the patient. Questions regarding the procedure were encouraged and answered. The patient understands and consents to the procedure. As antibiotic prophylaxis, cefazolin 2 g was ordered pre-procedure and administered intravenously within one hour of incision.Patency of the right IJ vein was confirmed with ultrasound with image documentation. An appropriate skin site was determined. Region  was prepped using maximum barrier technique including cap and mask, sterile gown, sterile gloves, large sterile sheet, and Chlorhexidine as cutaneous antisepsis. The region was infiltrated locally with 1% lidocaine.  Intravenous Fentanyl and Versed were administered as conscious sedation during continuous cardiorespiratory monitoring by the radiology RN, with a total moderate sedation time of 10  minutes.  Under real-time ultrasound guidance, the right IJ vein was accessed with a 21 gauge micropuncture needle; the needle tip within the vein was confirmed with ultrasound image documentation. Needle exchanged over the 018 guidewire for transitional dilator, which allowed advancement of a Benson wire into the IVC. Over this, an MPA catheter was advanced. A Palindrome 19 hemodialysis catheter was tunneled from the right anterior chest wall approach to the right IJ dermatotomy site. The MPA catheter was exchanged over an Amplatz wire for serial vascular dilators which allow placement of a peel-away sheath, through which the catheter was advanced under intermittent fluoroscopy, positioned with its tips in the proximal and midright atrium. Spot chest radiograph confirms good catheter position. No pneumothorax. Catheter was flushed and primed per protocol. Catheter secured externally with O Prolene suture. The right IJ dermatotomy site was closed with Dermabond.  COMPLICATIONS: COMPLICATIONS None immediate  FLUOROSCOPY TIME:  12 seconds, 3.7 mGy  COMPARISON:  None  IMPRESSION: 1. Technically successful placement of tunneled right IJ hemodialysis catheter with ultrasound and fluoroscopic guidance. Ready for routine use.   Electronically Signed   By: Lucrezia Europe M.D.   On: 07/24/2014 15:16   Dg Humerus Right  07/09/2014   CLINICAL DATA:  Injury to the right shoulder and humerus after fall on July 03, 2014 with pain.  EXAM: RIGHT HUMERUS - 2+ VIEW  COMPARISON:  None.  FINDINGS: There is comminuted displaced fracture of the  lateral aspect of the proximal right humerus. The visualized right ribs and right lung are normal.  IMPRESSION: Fracture of lateral aspect of proximal right humerus.   Electronically Signed   By: Abelardo Diesel M.D.   On: 07/09/2014 13:58   Dg Hip Unilat With Pelvis 2-3 Views Right  07/09/2014   CLINICAL DATA:  Fall with injury to the right hip 07/03/2014. Pain and bruising. Decreased range of motion.  EXAM: RIGHT HIP (WITH PELVIS) 2-3 VIEWS  COMPARISON:  None.  FINDINGS: Right hip is located. No acute bone or soft tissue abnormality is present. The pelvis is intact.  IMPRESSION: Negative right hip radiographs.   Electronically Signed   By: San Morelle M.D.   On: 07/09/2014 14:13    CBC  Recent Labs Lab 07/25/14 0801  07/27/14 1440 07/28/14 0510 07/29/14 0820 07/30/14 0442 07/31/14 0501  WBC 10.6*  < > 11.6* 10.7* 13.2* 11.8* 11.8*  HGB 8.0*  < > 8.0* 7.7* 8.4* 8.3* 8.6*  HCT 23.4*  < > 24.3* 24.0* 25.5* 25.0* 26.0*  PLT 197  < > 219 208 222 247 253  MCV 89.7  < > 91.7 94.1 95.5 93.6 93.2  MCH 30.7  < > 30.2 30.2 31.5 31.1 30.8  MCHC 34.2  < > 32.9 32.1 32.9 33.2 33.1  RDW 16.5*  < > 16.7* 16.9* 17.5* 17.3* 17.2*  LYMPHSABS 0.8  --   --   --   --   --   --   MONOABS 0.7  --   --   --   --   --   --   EOSABS 0.0  --   --   --   --   --   --   BASOSABS 0.0  --   --   --   --   --   --   < > = values in this interval not displayed.  Chemistries   Recent Labs Lab 07/27/14 1440 07/28/14 0510 07/29/14 0820 07/30/14 0442 07/31/14 0501  NA 120* 125*  125* 122* 122*  K 3.9 3.2* 3.8 3.8 3.9  CL 84* 92* 93* 89* 88*  CO2 25 24 23 23 23   GLUCOSE 136* 89 95 86 88  BUN 41* 25* 32* 38* 45*  CREATININE 5.62* 4.18* 5.39* 6.24* 6.99*  CALCIUM 8.4* 8.0* 8.5* 8.5* 8.7*   ------------------------------------------------------------------------------------------------------------------ estimated creatinine clearance is 7.5 mL/min (by C-G formula based on Cr of  6.99). ------------------------------------------------------------------------------------------------------------------ No results for input(s): HGBA1C in the last 72 hours. ------------------------------------------------------------------------------------------------------------------ No results for input(s): CHOL, HDL, LDLCALC, TRIG, CHOLHDL, LDLDIRECT in the last 72 hours. ------------------------------------------------------------------------------------------------------------------ No results for input(s): TSH, T4TOTAL, T3FREE, THYROIDAB in the last 72 hours.  Invalid input(s): FREET3 ------------------------------------------------------------------------------------------------------------------ No results for input(s): VITAMINB12, FOLATE, FERRITIN, TIBC, IRON, RETICCTPCT in the last 72 hours.  Coagulation profile No results for input(s): INR, PROTIME in the last 168 hours.  No results for input(s): DDIMER in the last 72 hours.  Cardiac Enzymes No results for input(s): CKMB, TROPONINI, MYOGLOBIN in the last 168 hours.  Invalid input(s): CK ------------------------------------------------------------------------------------------------------------------ Invalid input(s): POCBNP    Araiyah Cumpton D.O. on 07/31/2014 at 3:08 PM  Between 7am to 7pm - Pager - [phone]  After 7pm go to [email or web address] - password [email or web address]  And look for the night coverage person covering for me after hours  [facility] Office  [phone]

## 2014-08-01 LAB — CBC
HCT: 27.8 % — ABNORMAL LOW (ref 36.0–46.0)
HCT: 27.2 % — ABNORMAL LOW (ref 36.0–46.0)
Hemoglobin: 9.2 g/dL — ABNORMAL LOW (ref 12.0–15.0)
Hemoglobin: 9.2 g/dL — ABNORMAL LOW (ref 12.0–15.0)
MCH: 30.6 pg (ref 26.0–34.0)
MCH: 30.9 pg (ref 26.0–34.0)
MCHC: 33.1 g/dL (ref 30.0–36.0)
MCHC: 33.8 g/dL (ref 30.0–36.0)
MCV: 92.4 fL (ref 78.0–100.0)
MCV: 91.3 fL (ref 78.0–100.0)
PLATELETS: 279 10*3/uL (ref 150–400)
PLATELETS: 257 10*3/uL (ref 150–400)
RBC: 3.01 MIL/uL — ABNORMAL LOW (ref 3.87–5.11)
RBC: 2.98 MIL/uL — ABNORMAL LOW (ref 3.87–5.11)
RDW: 17.3 % — ABNORMAL HIGH (ref 11.5–15.5)
RDW: 16.9 % — ABNORMAL HIGH (ref 11.5–15.5)
WBC: 12.8 10*3/uL — ABNORMAL HIGH (ref 4.0–10.5)
WBC: 12.5 10*3/uL — ABNORMAL HIGH (ref 4.0–10.5)

## 2014-08-01 LAB — RENAL FUNCTION PANEL
Albumin: 3.5 g/dL (ref 3.5–5.0)
Albumin: 3.5 g/dL (ref 3.5–5.0)
Anion gap: 14 (ref 5–15)
Anion gap: 11 (ref 5–15)
BUN: 52 mg/dL — AB (ref 6–20)
BUN: 52 mg/dL — ABNORMAL HIGH (ref 6–20)
CALCIUM: 8.7 mg/dL — AB (ref 8.9–10.3)
CHLORIDE: 90 mmol/L — AB (ref 101–111)
CO2: 18 mmol/L — AB (ref 22–32)
CO2: 22 mmol/L (ref 22–32)
CREATININE: 7.77 mg/dL — AB (ref 0.44–1.00)
Calcium: 8.6 mg/dL — ABNORMAL LOW (ref 8.9–10.3)
Chloride: 89 mmol/L — ABNORMAL LOW (ref 101–111)
Creatinine, Ser: 8.01 mg/dL — ABNORMAL HIGH (ref 0.44–1.00)
GFR calc Af Amer: 6 mL/min — ABNORMAL LOW (ref >60)
GFR calc Af Amer: 6 mL/min — ABNORMAL LOW (ref >60)
GFR calc non Af Amer: 5 mL/min — ABNORMAL LOW (ref >60)
GFR, EST NON AFRICAN AMERICAN: 5 mL/min — AB (ref >60)
Glucose, Bld: 79 mg/dL (ref 65–99)
Glucose, Bld: 91 mg/dL (ref 65–99)
PHOSPHORUS: 4.7 mg/dL — AB (ref 2.5–4.6)
Phosphorus: 4.8 mg/dL — ABNORMAL HIGH (ref 2.5–4.6)
Potassium: 4.5 mmol/L (ref 3.5–5.1)
Potassium: 4.1 mmol/L (ref 3.5–5.1)
SODIUM: 122 mmol/L — AB (ref 135–145)
Sodium: 122 mmol/L — ABNORMAL LOW (ref 135–145)

## 2014-08-01 MED ORDER — HYDROCODONE-ACETAMINOPHEN 5-325 MG PO TABS
ORAL_TABLET | ORAL | Status: AC
  Administered 2014-08-01: 2 via ORAL
  Filled 2014-08-01: qty 2

## 2014-08-01 MED ORDER — ALTEPLASE 2 MG IJ SOLR
2.0000 mg | Freq: Once | INTRAMUSCULAR | Status: DC | PRN
  Filled 2014-08-01: qty 2

## 2014-08-01 MED ORDER — SODIUM CHLORIDE 0.9 % IV SOLN
100.0000 mL | INTRAVENOUS | Status: DC | PRN
Start: 2014-08-01 — End: 2014-08-01

## 2014-08-01 MED ORDER — HEPARIN SODIUM (PORCINE) 1000 UNIT/ML DIALYSIS: 1000.0000 [IU] | INTRAMUSCULAR | Status: DC | PRN

## 2014-08-01 MED ORDER — LIDOCAINE HCL (PF) 1 % IJ SOLN: 5.0000 mL | INTRAMUSCULAR | Status: DC | PRN

## 2014-08-01 MED ORDER — NEPRO/CARBSTEADY PO LIQD: 237.0000 mL | ORAL | Status: DC | PRN

## 2014-08-01 MED ORDER — ONDANSETRON HCL 4 MG/2ML IJ SOLN
INTRAMUSCULAR | Status: AC
  Administered 2014-08-01: 4 mg via INTRAVENOUS
  Filled 2014-08-01: qty 2

## 2014-08-01 MED ORDER — LIDOCAINE-PRILOCAINE 2.5-2.5 % EX CREA: 1.0000 "application " | TOPICAL_CREAM | CUTANEOUS | Status: DC | PRN

## 2014-08-01 MED ORDER — DARBEPOETIN ALFA 200 MCG/0.4ML IJ SOSY
PREFILLED_SYRINGE | INTRAMUSCULAR | Status: AC
  Administered 2014-08-01: 200 ug via INTRAVENOUS
  Filled 2014-08-01: qty 0.4

## 2014-08-01 MED ORDER — PENTAFLUOROPROP-TETRAFLUOROETH EX AERO: 1.0000 "application " | INHALATION_SPRAY | CUTANEOUS | Status: DC | PRN

## 2014-08-01 NOTE — Progress Notes (Signed)
[facility]                                                                              Patient Demographics  Erin Morgan, is a 59 y.o. female, DOB - October 16, 1955, [id number]  Admit date - 07/17/2014   Admitting Physician Toy Baker, MD  Outpatient Primary MD for the patient is Nicola Girt, DO  LOS - 15   Chief Complaint  Patient presents with  . Headache  . Nausea  . Hypertension      HPI on 07/17/2014 by Dr. Toy Baker Presented with 3-4 months edema and generalized joint pain.  Patient was diagnosed with RA since MAY she was prednisone at first and then was started on methotrexate. But her blood work was abnormal with rising Cr and anemia. He methotrexate was stopped with last dose being almost 2 weeks ago. She have had a nausea for few weeks and continued to have significant edema. Her rheumatologist started her on HCTZ to help with swelling. She have had a headache off and on for few weeks but gotten persistently worse. Now it became constant. She had to wake up in the night to take additional tylenol. Presented to PCP and had blood work today showing WBC of 15. She was instructed to come to ER. CT of the head was unremarkable. CR was noted to be up to 3.78, HG 9.4 WBC on repeat was down to 10.4 Denies any fever or chills. 2 weeks ago she traveled to [place] where she had fallen and broke her right arm. She was seen by orthopedics and is wearing a splint.  She reports decreased PO intake today, have had decreased urine output last urination was few hours ago and reports no change in color. 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   Assessment & Plan   Nausea and vomiting - still with nausea and vomiting, feels zofran helps - continue to provide senna and add bisacodyl supp   Acute renal failure - renal biopsy with thrombotic microangiopathy, ? TTP (ADAMTS13  deficiency), HUS, immune mediated or toxin mediated - ? malignant HTN - following tests still pending: ADAMTS14, LDH, haptoglobin - continue HD per nephrology team, assistance is appreciated  - Creatinine 7.77 today - Patient will have HD today as per nephrology notes - continue to monitor urine output, 1250cc over the past 24 hours  Leukocytosis - unclear etiology, pt is afebrile, possibly reactive and related to prednisone use  - no specific infectious etiology noted  - pt has refused XRAY   Rheumatoid arthritis  - Continue prednisone 20 mg daily  - holding MTX  - appears reasonably stable at present  - no focal joint complaints  Acute encephalopathy - resolved   Normocytic anemia - Iron studies suggest iron deficiency  - dosed with Feraheme 07/20/14 - Hg remains overall stable with no signs of active bleeding, currently 8.6  Hypervolemic Hyponatremia - Due to acute renal failure - Na down over the past 24 hours: 125 --> 122  - Continue to monitor BMP  Hypokalemia - WNL this AM - daily renal panel   Hypertensive urgency  -  currently on Norvasc, Hydralazine, Clonidine  - Patient had been on hydralazine prior to admission > anti-histone antibody negative - Echocardiogram notes no impairment in systolic fxn or WMA - Nephrology adjusted medical therapy 7/18 and BP has been reasonably stable so far  [facility] will improve with dialysis today  Persistent waxing/waning HA  - better this AM but feels lightheaded - orthostatic vitals negative   Inadequate oral intake in the setting of acute illness  - nutritionist consulted, recommendations appreciated   Code Status: Full  Family Communication: Parents at bedside  Disposition Plan: Admitted.  Continue to monitor Cr.  HD today.  Asked for permission to have dying dog come to the hospital.   Time Spent in minutes   30 minutes  Procedures  Renal Bx  Consults   Nephrology  DVT Prophylaxis  SCDs  Lab  Results  Component Value Date   PLT 279 08/01/2014    Medications  Scheduled Meds: . amLODipine  10 mg Oral QHS  . bisacodyl  10 mg Rectal BID  . calcium acetate  667 mg Oral TID WC  . cloNIDine  0.1 mg Oral TID  . darbepoetin (ARANESP) injection - DIALYSIS  200 mcg Intravenous Q Thu-HD  . feeding supplement (NEPRO CARB STEADY)  237 mL Oral BID BM  . hydrALAZINE  50 mg Oral 3 times per day  . HYDROcodone-acetaminophen      . multivitamin  1 tablet Oral QHS  . neomycin-polymyxin b-dexamethasone  2 drop Right Eye Q12H  . pantoprazole  20 mg Oral Daily  . predniSONE  20 mg Oral QAC breakfast  . senna-docusate  1 tablet Oral BID  . sodium chloride  3 mL Intravenous Q12H   Continuous Infusions: . sodium chloride 10 mL/hr at 07/18/14 1400  . sodium chloride     PRN Meds:.sodium chloride, sodium chloride, sodium chloride, sodium chloride, acetaminophen, alteplase, butalbital-acetaminophen-caffeine, feeding supplement (NEPRO CARB STEADY), feeding supplement (NEPRO CARB STEADY), heparin, heparin, heparin, hydrALAZINE, HYDROcodone-acetaminophen, lidocaine (PF), lidocaine (PF), lidocaine-prilocaine, lidocaine-prilocaine, morphine injection, ondansetron **OR** ondansetron (ZOFRAN) IV, oxyCODONE-acetaminophen, pentafluoroprop-tetrafluoroeth, pentafluoroprop-tetrafluoroeth, prochlorperazine  Antibiotics    Anti-infectives    Start     Dose/Rate Route Frequency Ordered Stop   07/24/14 1000  ceFAZolin (ANCEF) IVPB 2 g/50 mL premix    Comments:  Hang ON CALL to xray 7/20   2 g 100 mL/hr over 30 Minutes Intravenous On call 07/24/14 0908 07/24/14 1503      Subjective:   [name] seen and examined today.  Patient feels slightly better today.  Continues to complain of headache and nausea, poor appetite. Asks if her dog can come to the hospital.  Denies chest pain, shortness of breath, abdominal pain.   Objective:   Filed Vitals:   08/01/14 0609 08/01/14 1003 08/01/14 1239 08/01/14  1300  BP: 157/63 170/79 178/93 179/93  Pulse: 84 86  86  Temp: 98.3 F (36.8 C) 97.9 F (36.6 C) 97.5 F (36.4 C)   TempSrc:  Oral Oral   Resp: 18 18 27    Height:      Weight:   64.5 kg (142 lb 3.2 oz)   SpO2: 99% 100% 97%     Wt Readings from Last 3 Encounters:  08/01/14 64.5 kg (142 lb 3.2 oz)  04/05/14 62.143 kg (137 lb)  05/21/12 59.875 kg (132 lb)     Intake/Output Summary (Last 24 hours) at 08/01/14 1314 Last data filed at 08/01/14 0904  Gross per 24 hour  Intake  510 ml  Output    800 ml  Net   -290 ml    Exam  General: Well developed, well nourished, NAD  HEENT: NCAT, mucous membranes moist.   Cardiovascular: S1 S2 auscultated, RRR, no murmurs  Respiratory: Clear to auscultation bilaterally with equal chest rise  Abdomen: Soft, nontender, nondistended, + bowel sounds  Extremities: warm dry without cyanosis clubbing. 2+ edema LE B/L, trace edema upper ext.   Neuro: AAOx3, nonfocal  Psych: pleasant, appropriate mood and affect  Data Review   Micro Results No results found for this or any previous visit (from the past 240 hour(s)).  Radiology Reports Dg Shoulder Right  07/09/2014   CLINICAL DATA:  Right shoulder injury.  Fall.  EXAM: RIGHT SHOULDER - 2+ VIEW  COMPARISON:  None.  FINDINGS: Fracture of the greater tuberosity of the proximal right humerus is present. No other abnormality identified. No evidence of dislocation or separation.  IMPRESSION: Minimally displaced fracture of the greater tuberosity of the proximal right humerus.   Electronically Signed   By: Marcello Moores  Register   On: 07/09/2014 13:58   Ct Head Wo Contrast  07/17/2014   CLINICAL DATA:  59 year old female with headache, nausea and leukocytosis  EXAM: CT HEAD WITHOUT CONTRAST  TECHNIQUE: Contiguous axial images were obtained from the base of the skull through the vertex without intravenous contrast.  COMPARISON:  None.  FINDINGS: Negative for acute intracranial hemorrhage, acute  infarction, mass, mass effect, hydrocephalus or midline shift. Gray-white differentiation is preserved throughout. No acute soft tissue or calvarial abnormality. The globes and orbits are symmetric and unremarkable. Normal aeration of the mastoid air cells and visualized paranasal sinuses. Small sclerotic focus in the right frontal calvarium just above the orbit likely represents a benign bone island.  IMPRESSION: Negative head CT.   Electronically Signed   By: Jacqulynn Cadet M.D.   On: 07/17/2014 17:04   Mr Brain Wo Contrast  07/18/2014   CLINICAL DATA:  Initial evaluation for acute headache.  EXAM: MRI HEAD WITHOUT CONTRAST  TECHNIQUE: Multiplanar, multiecho pulse sequences of the brain and surrounding structures were obtained without intravenous contrast.  COMPARISON:  Prior CT from 07/17/2014  FINDINGS: The CSF containing spaces are within normal limits for patient age. Few scattered T2/FLAIR hyperintense foci present within the periventricular and deep white matter of both cerebral hemispheres, nonspecific. No mass lesion, midline shift, or extra-axial fluid collection. Ventricles are normal in size without evidence of hydrocephalus.  No diffusion-weighted signal abnormality is identified to suggest acute intracranial infarct. Gray-white matter differentiation is maintained. Normal flow voids are seen within the intracranial vasculature. No intracranial hemorrhage identified.  The cervicomedullary junction is normal. Pituitary gland is within normal limits. Pituitary stalk is midline. The globes and optic nerves demonstrate a normal appearance with normal signal intensity. The  The bone marrow signal intensity is normal. Calvarium is intact. Visualized upper cervical spine is within normal limits.  Scalp soft tissues are unremarkable.  Paranasal sinuses are clear.  No mastoid effusion.  IMPRESSION: 1. No acute intracranial process identified. 2. Few scattered subcentimeter T2/FLAIR height matter foci  within the supratentorial white matter. These foci are nonspecific, but may be related to very mild chronic small vessel ischemic changes. These are felt to be within normal limits for patient age. 3. Otherwise normal brain MRI   Electronically Signed   By: Jeannine Boga M.D.   On: 07/18/2014 06:53   US Renal  07/18/2014   CLINICAL DATA:  Acute renal failure.  EXAM: RENAL / URINARY TRACT ULTRASOUND COMPLETE  COMPARISON:  None.  FINDINGS: Right Kidney:  Length: 10.0 cm. Echogenicity within normal limits. No mass or hydronephrosis visualized.  Left Kidney:  Length: 9.4 cm. Echogenicity within normal limits. No mass or hydronephrosis visualized.  Bladder:  Appears normal for degree of bladder distention.  Small bilateral pleural effusions incidentally noted.  IMPRESSION: No evidence of hydronephrosis or other renal abnormality.  Small bilateral pleural effusions incidentally noted.   Electronically Signed   By: Earle Gell M.D.   On: 07/18/2014 11:55   US Biopsy  07/23/2014   CLINICAL DATA:  Renal insufficiency  EXAM: ULTRASOUND BIOPSY  COMPARISON:  None.  FINDINGS: Imaging was provided to the Nephrology service for renal biopsy.  IMPRESSION: See above.   Electronically Signed   By: Marybelle Killings M.D.   On: 07/23/2014 13:26   Ir Fluoro Guide Cv Line Right  07/24/2014   CLINICAL DATA:  Acute renal failure, needs access for hemodialysis  EXAM: TUNNELED HEMODIALYSIS CATHETER PLACEMENT WITH ULTRASOUND AND FLUOROSCOPIC GUIDANCE  TECHNIQUE: The procedure, risks, benefits, and alternatives were explained to the patient. Questions regarding the procedure were encouraged and answered. The patient understands and consents to the procedure. As antibiotic prophylaxis, cefazolin 2 g was ordered pre-procedure and administered intravenously within one hour of incision.Patency of the right IJ vein was confirmed with ultrasound with image documentation. An appropriate skin site was determined. Region was prepped using  maximum barrier technique including cap and mask, sterile gown, sterile gloves, large sterile sheet, and Chlorhexidine as cutaneous antisepsis. The region was infiltrated locally with 1% lidocaine.  Intravenous Fentanyl and Versed were administered as conscious sedation during continuous cardiorespiratory monitoring by the radiology RN, with a total moderate sedation time of 10 minutes.  Under real-time ultrasound guidance, the right IJ vein was accessed with a 21 gauge micropuncture needle; the needle tip within the vein was confirmed with ultrasound image documentation. Needle exchanged over the 018 guidewire for transitional dilator, which allowed advancement of a Benson wire into the IVC. Over this, an MPA catheter was advanced. A Palindrome 19 hemodialysis catheter was tunneled from the right anterior chest wall approach to the right IJ dermatotomy site. The MPA catheter was exchanged over an Amplatz wire for serial vascular dilators which allow placement of a peel-away sheath, through which the catheter was advanced under intermittent fluoroscopy, positioned with its tips in the proximal and midright atrium. Spot chest radiograph confirms good catheter position. No pneumothorax. Catheter was flushed and primed per protocol. Catheter secured externally with O Prolene suture. The right IJ dermatotomy site was closed with Dermabond.  COMPLICATIONS: COMPLICATIONS None immediate  FLUOROSCOPY TIME:  12 seconds, 3.7 mGy  COMPARISON:  None  IMPRESSION: 1. Technically successful placement of tunneled right IJ hemodialysis catheter with ultrasound and fluoroscopic guidance. Ready for routine use.   Electronically Signed   By: Lucrezia Europe M.D.   On: 07/24/2014 15:16   Ir US Guide Vasc Access Right  07/24/2014   CLINICAL DATA:  Acute renal failure, needs access for hemodialysis  EXAM: TUNNELED HEMODIALYSIS CATHETER PLACEMENT WITH ULTRASOUND AND FLUOROSCOPIC GUIDANCE  TECHNIQUE: The procedure, risks, benefits, and  alternatives were explained to the patient. Questions regarding the procedure were encouraged and answered. The patient understands and consents to the procedure. As antibiotic prophylaxis, cefazolin 2 g was ordered pre-procedure and administered intravenously within one hour of incision.Patency of the right IJ vein was confirmed with ultrasound with image documentation. An appropriate skin site was  determined. Region was prepped using maximum barrier technique including cap and mask, sterile gown, sterile gloves, large sterile sheet, and Chlorhexidine as cutaneous antisepsis. The region was infiltrated locally with 1% lidocaine.  Intravenous Fentanyl and Versed were administered as conscious sedation during continuous cardiorespiratory monitoring by the radiology RN, with a total moderate sedation time of 10 minutes.  Under real-time ultrasound guidance, the right IJ vein was accessed with a 21 gauge micropuncture needle; the needle tip within the vein was confirmed with ultrasound image documentation. Needle exchanged over the 018 guidewire for transitional dilator, which allowed advancement of a Benson wire into the IVC. Over this, an MPA catheter was advanced. A Palindrome 19 hemodialysis catheter was tunneled from the right anterior chest wall approach to the right IJ dermatotomy site. The MPA catheter was exchanged over an Amplatz wire for serial vascular dilators which allow placement of a peel-away sheath, through which the catheter was advanced under intermittent fluoroscopy, positioned with its tips in the proximal and midright atrium. Spot chest radiograph confirms good catheter position. No pneumothorax. Catheter was flushed and primed per protocol. Catheter secured externally with O Prolene suture. The right IJ dermatotomy site was closed with Dermabond.  COMPLICATIONS: COMPLICATIONS None immediate  FLUOROSCOPY TIME:  12 seconds, 3.7 mGy  COMPARISON:  None  IMPRESSION: 1. Technically successful  placement of tunneled right IJ hemodialysis catheter with ultrasound and fluoroscopic guidance. Ready for routine use.   Electronically Signed   By: Lucrezia Europe M.D.   On: 07/24/2014 15:16   Dg Humerus Right  07/09/2014   CLINICAL DATA:  Injury to the right shoulder and humerus after fall on July 03, 2014 with pain.  EXAM: RIGHT HUMERUS - 2+ VIEW  COMPARISON:  None.  FINDINGS: There is comminuted displaced fracture of the lateral aspect of the proximal right humerus. The visualized right ribs and right lung are normal.  IMPRESSION: Fracture of lateral aspect of proximal right humerus.   Electronically Signed   By: Abelardo Diesel M.D.   On: 07/09/2014 13:58   Dg Hip Unilat With Pelvis 2-3 Views Right  07/09/2014   CLINICAL DATA:  Fall with injury to the right hip 07/03/2014. Pain and bruising. Decreased range of motion.  EXAM: RIGHT HIP (WITH PELVIS) 2-3 VIEWS  COMPARISON:  None.  FINDINGS: Right hip is located. No acute bone or soft tissue abnormality is present. The pelvis is intact.  IMPRESSION: Negative right hip radiographs.   Electronically Signed   By: San Morelle M.D.   On: 07/09/2014 14:13    CBC  Recent Labs Lab 07/28/14 0510 07/29/14 0820 07/30/14 0442 07/31/14 0501 08/01/14 0437  WBC 10.7* 13.2* 11.8* 11.8* 12.8*  HGB 7.7* 8.4* 8.3* 8.6* 9.2*  HCT 24.0* 25.5* 25.0* 26.0* 27.8*  PLT 208 222 247 253 279  MCV 94.1 95.5 93.6 93.2 92.4  MCH 30.2 31.5 31.1 30.8 30.6  MCHC 32.1 32.9 33.2 33.1 33.1  RDW 16.9* 17.5* 17.3* 17.2* 17.3*    Chemistries   Recent Labs Lab 07/28/14 0510 07/29/14 0820 07/30/14 0442 07/31/14 0501 08/01/14 0437  NA 125* 125* 122* 122* 122*  K 3.2* 3.8 3.8 3.9 4.5  CL 92* 93* 89* 88* 90*  CO2 24 23 23 23  18*  GLUCOSE 89 95 86 88 79  BUN 25* 32* 38* 45* 52*  CREATININE 4.18* 5.39* 6.24* 6.99* 7.77*  CALCIUM 8.0* 8.5* 8.5* 8.7* 8.7*    ------------------------------------------------------------------------------------------------------------------ estimated creatinine clearance is 6.7 mL/min (by C-G formula based on Cr of  7.77). ------------------------------------------------------------------------------------------------------------------ No results for input(s): HGBA1C in the last 72 hours. ------------------------------------------------------------------------------------------------------------------ No results for input(s): CHOL, HDL, LDLCALC, TRIG, CHOLHDL, LDLDIRECT in the last 72 hours. ------------------------------------------------------------------------------------------------------------------ No results for input(s): TSH, T4TOTAL, T3FREE, THYROIDAB in the last 72 hours.  Invalid input(s): FREET3 ------------------------------------------------------------------------------------------------------------------ No results for input(s): VITAMINB12, FOLATE, FERRITIN, TIBC, IRON, RETICCTPCT in the last 72 hours.  Coagulation profile No results for input(s): INR, PROTIME in the last 168 hours.  No results for input(s): DDIMER in the last 72 hours.  Cardiac Enzymes No results for input(s): CKMB, TROPONINI, MYOGLOBIN in the last 168 hours.  Invalid input(s): CK ------------------------------------------------------------------------------------------------------------------ Invalid input(s): POCBNP    Aliayah Tyer D.O. on 08/01/2014 at 1:14 PM  Between 7am to 7pm - Pager - [phone]  After 7pm go to [email or web address] - password [email or web address]  And look for the night coverage person covering for me after hours  [facility] Office  [phone]

## 2014-08-01 NOTE — Progress Notes (Signed)
Patient refusing bed alarm this evening shift.  Bed in lowest position.  Emphasized use of call bell if needed to use the bathroom; verbalized understanding.  Will continue to monitor patient.

## 2014-08-01 NOTE — Progress Notes (Signed)
Nutrition Follow-up  DOCUMENTATION CODES:   Not applicable  INTERVENTION:   Continue Nepro Shake po BID, each supplement provides 425 kcal and 19 grams protein.  Provide nourishment snacks. (Ordered)  Encourage adequate PO intake.   NUTRITION DIAGNOSIS:   Inadequate oral intake related to poor appetite as evidenced by meal completion < 50%; improving  GOAL:   Patient will meet greater than or equal to 90% of their needs; progressing  MONITOR:   PO intake, Supplement acceptance, Weight trends, Labs, I & O's  REASON FOR ASSESSMENT:   Consult Assessment of nutrition requirement/status  ASSESSMENT:   59 y.o. female w/ a history of RA and osteopenia who presented with 3-4 months of edema and generalized joint pain. Patient was diagnosed with RA in May and was tx w/ prednisone initially and then was started on methotrexate. Due to rising Cr and anemia the methotrexate was stopped with last dose being almost 2 weeks prior to this admission. Presents with ARF . preliminary renal biopsy showed thrombotic microangiopathy, ? TTP (ADAMTS13 deficiency), HUS, immune mediated or toxin mediated. Pt on HD.  Pt continues to have some n/v. Meal completion has been varied from 0-100% with meal completion this AM at 100%. Pt has Nepro Shake ordered and has been consuming them intermittently. RD to continue with current orders.   Labs: low sodium, chloride, calcium, and GFR. High BUN, creatinine, and phosphorous (4.8).  Diet Order:  Diet renal with fluid restriction Fluid restriction:: 1200 mL Fluid; Room service appropriate?: Yes; Fluid consistency:: Thin  Skin:   (Incision on L flank, +2 LE edema)  Last BM:  7/25  Height:   Ht Readings from Last 1 Encounters:  07/17/14 5\' 4"  (1.626 m)    Weight:   Wt Readings from Last 1 Encounters:  08/01/14 142 lb 3.2 oz (64.5 kg)    Ideal Body Weight:  54.5 kg  BMI:  Body mass index is 24.4 kg/(m^2).  Estimated Nutritional Needs:    Kcal:  1700-1900  Protein:  80-95 grams  Fluid:  1.2 L/day  EDUCATION NEEDS:   No education needs identified at this time  Corrin Parker, MS, RD, LDN Pager # [phone] After hours/ weekend pager # [phone]

## 2014-08-01 NOTE — Progress Notes (Signed)
Subjective:  Erin Morgan is a 59 yo female presenting with ARF with unknown etiology. Patient was seen and examined this morning. Patient complains of headache and nausea. She is in better spirits than yesterday. She would like to proceed with dialysis today.   Objective: Filed Vitals:   07/31/14 2032 07/31/14 2100 08/01/14 0609 08/01/14 1003  BP: 157/70  157/63 170/79  Pulse: 69  84 86  Temp: 98.2 F (36.8 C)  98.3 F (36.8 C) 97.9 F (36.6 C)  TempSrc:    Oral  Resp: 21  18 18   Height:      Weight:  142 lb (64.411 kg)    SpO2: 100%  99% 100%   General: Vital signs reviewed. Patient is in no acute distress and cooperative with exam.  Cardiovascular: RRR, S1 normal, S2 normal Pulmonary/Chest: CTA b/l, no wheezes, rales, or rhonchi. Abdominal: Soft, non-tender, non-distended, BS +.   Extremities: 2+ lower extremity pitting edema bilaterally, pulses symmetric and intact bilaterally. Skin: Warm, dry and intact. No rashes or erythema.  Intake/Output from previous day: 07/27 0701 - 07/28 0700 In: 60 [P.O.:60] Out: 1250 [Urine:1250] Intake/Output this shift: Total I/O In: 510 [P.O.:510] Out: -   Lab Results:  Recent Labs  07/31/14 0501 08/01/14 0437  WBC 11.8* 12.8*  HGB 8.6* 9.2*  HCT 26.0* 27.8*  PLT 253 279   BMET:   Recent Labs  07/31/14 0501 08/01/14 0437  NA 122* 122*  K 3.9 4.5  CL 88* 90*  CO2 23 18*  GLUCOSE 88 79  BUN 45* 52*  CREATININE 6.99* 7.77*  CALCIUM 8.7* 8.7*   I have reviewed the patient's current medications. Prior to Admission:  Prescriptions prior to admission  Medication Sig Dispense Refill Last Dose  . CALCIUM PO Take 1 tablet by mouth daily.   Past Week at Unknown time  . Cholecalciferol 1000 UNITS capsule Take 1,000 Units by mouth daily.   Past Week at Unknown time  . estradiol (VAGIFEM) 25 MCG vaginal tablet Place 25 mcg vaginally 2 (two) times a week.    Past Week at Unknown time  . folic acid (FOLVITE) 1 MG tablet Take 1  mg by mouth daily.   07/16/2014 at Unknown time  . hydrochlorothiazide (MICROZIDE) 12.5 MG capsule Take 12.5 mg by mouth as needed (for fluid).    07/16/2014 at Unknown time  . predniSONE (DELTASONE) 5 MG tablet Take 20 mg by mouth daily.   07/16/2014 at Unknown time  . HYDROcodone-acetaminophen (NORCO) 5-325 MG per tablet Take 1 tablet by mouth every 6 (six) hours as needed for moderate pain. 30 tablet 0    Scheduled: . amLODipine  10 mg Oral QHS  . bisacodyl  10 mg Rectal BID  . calcium acetate  667 mg Oral TID WC  . cloNIDine  0.1 mg Oral TID  . darbepoetin (ARANESP) injection - DIALYSIS  200 mcg Intravenous Q Thu-HD  . feeding supplement (NEPRO CARB STEADY)  237 mL Oral BID BM  . hydrALAZINE  50 mg Oral 3 times per day  . multivitamin  1 tablet Oral QHS  . neomycin-polymyxin b-dexamethasone  2 drop Right Eye Q12H  . pantoprazole  20 mg Oral Daily  . predniSONE  20 mg Oral QAC breakfast  . senna-docusate  1 tablet Oral BID  . sodium chloride  3 mL Intravenous Q12H   Continuous: . sodium chloride 10 mL/hr at 07/18/14 1400  . sodium chloride     [id number] chloride, sodium chloride, acetaminophen, butalbital-acetaminophen-caffeine,  feeding supplement (NEPRO CARB STEADY), heparin, heparin, hydrALAZINE, HYDROcodone-acetaminophen, lidocaine (PF), lidocaine-prilocaine, morphine injection, ondansetron **OR** ondansetron (ZOFRAN) IV, oxyCODONE-acetaminophen, pentafluoroprop-tetrafluoroeth, prochlorperazine  Assessment/Plan:   ARF: Presenting with ARF of unknown etiology. Renal biopsy results showed thrombotic microangiopathy. Further work up has been unremarkable making the most likely etiology hypertension. Cr continues to rise to 7.77 this morning. UOP -1250 yesterday. We will proceed with HD today and monitor over the weekend. By Monday we should know if patient will require long term HD.  -Renal function panel daily -Continue to monitor UOP -HD today  Hypertension: Mildly elevated this  morning at 157/63. Will improve with HD. -Continue amlodipine 10 mg daily -Continue clonodine 0.1 mg TID -Continue Hydralazine 50 mg TID    Hyperphosphatemia: Phosphorous 4.7 this morning.  -PhosLo 667 mg TID WC -HD  Iron Deficiency Anemia: Hemoglobin stable around 8-9.  -Monitor CBC intermittently  Hypervolemic Hyponatremia: Sodium this morning 122. Likely secondary to renal failure.  -Fluid restrict -HD  RA: Holding methotrexate for now.  -Continue prednisone 20 mg daily   LOS: 15 days   Osa Craver, DO PGY-2 Internal Medicine Resident Pager # [phone] 08/01/2014 11:41 AM

## 2014-08-02 ENCOUNTER — Encounter (HOSPITAL_COMMUNITY): Payer: Self-pay

## 2014-08-02 ENCOUNTER — Inpatient Hospital Stay (HOSPITAL_COMMUNITY): Payer: BC Managed Care – PPO

## 2014-08-02 LAB — CBC
HEMATOCRIT: 27.7 % — AB (ref 36.0–46.0)
HEMOGLOBIN: 9.2 g/dL — AB (ref 12.0–15.0)
MCH: 30.8 pg (ref 26.0–34.0)
MCHC: 33.2 g/dL (ref 30.0–36.0)
MCV: 92.6 fL (ref 78.0–100.0)
Platelets: 236 10*3/uL (ref 150–400)
RBC: 2.99 MIL/uL — ABNORMAL LOW (ref 3.87–5.11)
RDW: 17.4 % — ABNORMAL HIGH (ref 11.5–15.5)
WBC: 10.6 10*3/uL — ABNORMAL HIGH (ref 4.0–10.5)

## 2014-08-02 LAB — RENAL FUNCTION PANEL
Albumin: 3.3 g/dL — ABNORMAL LOW (ref 3.5–5.0)
Anion gap: 12 (ref 5–15)
BUN: 32 mg/dL — AB (ref 6–20)
CHLORIDE: 91 mmol/L — AB (ref 101–111)
CO2: 23 mmol/L (ref 22–32)
CREATININE: 6.12 mg/dL — AB (ref 0.44–1.00)
Calcium: 8.3 mg/dL — ABNORMAL LOW (ref 8.9–10.3)
GFR calc Af Amer: 8 mL/min — ABNORMAL LOW (ref >60)
GFR calc non Af Amer: 7 mL/min — ABNORMAL LOW (ref >60)
Glucose, Bld: 85 mg/dL (ref 65–99)
PHOSPHORUS: 4.2 mg/dL (ref 2.5–4.6)
POTASSIUM: 3.8 mmol/L (ref 3.5–5.1)
SODIUM: 126 mmol/L — AB (ref 135–145)

## 2014-08-02 NOTE — Progress Notes (Signed)
Subjective:  Erin Morgan is a 59 yo female presenting with ARF with unknown etiology. Patient was seen and examined this morning. Patient continues to have a headache, but nausea has improved. She denies any shortness of breath.  Objective: Filed Vitals:   08/01/14 1910 08/01/14 2144 08/02/14 0016 08/02/14 0548  BP: 168/85 175/75 165/69 172/78  Pulse:  101  97  Temp:  99.1 F (37.3 C)  99.1 F (37.3 C)  TempSrc:      Resp:  18  18  Height:      Weight:      SpO2:  98%  97%   General: Vital signs reviewed. Patient is in no acute distress and cooperative with exam.  Cardiovascular: RRR, S1 normal, S2 normal Pulmonary/Chest: Mild inspiratory crackles in lower lung fields, no wheezes, or rhonchi. Abdominal: Soft, non-tender, non-distended, BS +.   Extremities: 2+ lower extremity pitting edema bilaterally, pulses symmetric and intact bilaterally. Skin: Warm, dry and intact. No rashes or erythema.  Intake/Output from previous day: 07/28 0701 - 07/29 0700 In: [id number] [P.O.:1530; I.V.:3] Out: 2375 [Urine:375] Intake/Output this shift: Total I/O In: 120 [P.O.:120] Out: -   Lab Results:  Recent Labs  08/01/14 1230 08/02/14 0433  WBC 12.5* 10.6*  HGB 9.2* 9.2*  HCT 27.2* 27.7*  PLT 257 236   BMET:   Recent Labs  08/01/14 1230 08/02/14 0433  NA 122* 126*  K 4.1 3.8  CL 89* 91*  CO2 22 23  GLUCOSE 91 85  BUN 52* 32*  CREATININE 8.01* 6.12*  CALCIUM 8.6* 8.3*   I have reviewed the patient's current medications. Prior to Admission:  Prescriptions prior to admission  Medication Sig Dispense Refill Last Dose  . CALCIUM PO Take 1 tablet by mouth daily.   Past Week at Unknown time  . Cholecalciferol 1000 UNITS capsule Take 1,000 Units by mouth daily.   Past Week at Unknown time  . estradiol (VAGIFEM) 25 MCG vaginal tablet Place 25 mcg vaginally 2 (two) times a week.    Past Week at Unknown time  . folic acid (FOLVITE) 1 MG tablet Take 1 mg by mouth daily.   07/16/2014  at Unknown time  . hydrochlorothiazide (MICROZIDE) 12.5 MG capsule Take 12.5 mg by mouth as needed (for fluid).    07/16/2014 at Unknown time  . predniSONE (DELTASONE) 5 MG tablet Take 20 mg by mouth daily.   07/16/2014 at Unknown time  . HYDROcodone-acetaminophen (NORCO) 5-325 MG per tablet Take 1 tablet by mouth every 6 (six) hours as needed for moderate pain. 30 tablet 0    Scheduled: . amLODipine  10 mg Oral QHS  . bisacodyl  10 mg Rectal BID  . calcium acetate  667 mg Oral TID WC  . cloNIDine  0.1 mg Oral TID  . darbepoetin (ARANESP) injection - DIALYSIS  200 mcg Intravenous Q Thu-HD  . feeding supplement (NEPRO CARB STEADY)  237 mL Oral BID BM  . hydrALAZINE  50 mg Oral 3 times per day  . multivitamin  1 tablet Oral QHS  . neomycin-polymyxin b-dexamethasone  2 drop Right Eye Q12H  . pantoprazole  20 mg Oral Daily  . predniSONE  20 mg Oral QAC breakfast  . senna-docusate  1 tablet Oral BID  . sodium chloride  3 mL Intravenous Q12H   Continuous: . sodium chloride 10 mL/hr at 07/18/14 1400  . sodium chloride     [id number], butalbital-acetaminophen-caffeine, hydrALAZINE, HYDROcodone-acetaminophen, morphine injection, ondansetron **OR** ondansetron (ZOFRAN) IV, oxyCODONE-acetaminophen, prochlorperazine  Assessment/Plan:   ARF: Presenting with ARF of unknown etiology. Renal biopsy results showed thrombotic microangiopathy. Further work up has been unremarkable making the most likely etiology hypertension. However, with recent elevated BPs it may be useful to purse CT abdomen/pelvis to evaluate for adrenal malignancy. Cr down from 8.0>6.12 after dialysis yesterday. UOP -375 yesterday. We will continue to monitor over the weekend and by Monday we should know if patient will require long term HD.  -Renal function panel daily -Continue to monitor UOP -CT abd/pelvis non-contrast  Hypertension: Elevated in the 170/70s. May need adjustment in regimen. -Continue amlodipine 10 mg  daily -Continue clonodine 0.1 mg TID -Continue Hydralazine 50 mg TID    Hyperphosphatemia: Phosphorous 4.2 this morning.  -PhosLo 667 mg TID WC  Iron Deficiency Anemia: Hemoglobin stable around 8-9.  -Monitor CBC intermittently  Hypervolemic Hyponatremia: Sodium this morning 126, improved after HD yesterday. Likely secondary to renal failure.  -Fluid restrict  RA: Holding methotrexate for now.  -Continue prednisone 20 mg daily   LOS: 16 days   Osa Craver, DO PGY-2 Internal Medicine Resident Pager # [phone] 08/02/2014 1:22 PM

## 2014-08-02 NOTE — Progress Notes (Signed)
[facility]                                                                              Patient Demographics  Erin Morgan, is a 59 y.o. female, DOB - 1955/06/30, [id number]  Admit date - 07/17/2014   Admitting Physician Toy Baker, MD  Outpatient Primary MD for the patient is Nicola Girt, DO  LOS - 16   Chief Complaint  Patient presents with  . Headache  . Nausea  . Hypertension      HPI on 07/17/2014 by Dr. Toy Baker Presented with 3-4 months edema and generalized joint pain.  Patient was diagnosed with RA since MAY she was prednisone at first and then was started on methotrexate. But her blood work was abnormal with rising Cr and anemia. He methotrexate was stopped with last dose being almost 2 weeks ago. She have had a nausea for few weeks and continued to have significant edema. Her rheumatologist started her on HCTZ to help with swelling. She have had a headache off and on for few weeks but gotten persistently worse. Now it became constant. She had to wake up in the night to take additional tylenol. Presented to PCP and had blood work today showing WBC of 15. She was instructed to come to ER. CT of the head was unremarkable. CR was noted to be up to 3.78, HG 9.4 WBC on repeat was down to 10.4 Denies any fever or chills. 2 weeks ago she traveled to [place] where she had fallen and broke her right arm. She was seen by orthopedics and is wearing a splint.  She reports decreased PO intake today, have had decreased urine output last urination was few hours ago and reports no change in color. 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   Assessment & Plan   Nausea and vomiting - still with nausea and vomiting, feels zofran helps - continue to provide senna and add bisacodyl supp   Acute renal failure - renal biopsy with thrombotic microangiopathy, ? TTP (ADAMTS13  deficiency), HUS, immune mediated or toxin mediated - ? malignant HTN - following tests still pending: ADAMTS14, LDH, haptoglobin - continue HD per nephrology team, assistance is appreciated  - Creatinine 6.12 today, however patient dialyzed 08/01/2014 - Spoke with Dr. Justin Mend, will continue to monitor and possibly discharge next week with outpatient dialysis - Will obtain CTabd/pelvis w/o contrast to r/o adrenal malignancy  Leukocytosis - unclear etiology, pt is afebrile, possibly reactive and related to prednisone use  - WBC trending downward, today 10.6 - no specific infectious etiology noted  - pt has refused XRAY   Rheumatoid arthritis  - Continue prednisone 20 mg daily  - holding MTX  - appears reasonably stable at present  - no focal joint complaints  Acute encephalopathy - resolved   Normocytic anemia - Iron studies suggest iron deficiency  - dosed with Feraheme 07/20/14 - Hg remains overall stable with no signs of active bleeding, currently 9.2  Hypervolemic Hyponatremia - Due to acute renal failure - Na down over the past 24 hours: 125 --> 122  - Continue to monitor  BMP  Hypokalemia - WNL this AM - daily renal panel   Hypertensive urgency  - currently on Norvasc, Hydralazine, Clonidine  - Patient had been on hydralazine prior to admission > anti-histone antibody negative - Echocardiogram notes no impairment in systolic fxn or WMA  Persistent waxing/waning HA  - Continue pain control as needed - orthostatic vitals negative   Inadequate oral intake in the setting of acute illness  - nutritionist consulted, recommendations appreciated   Code Status: Full  Family Communication: Parents at bedside  Disposition Plan: Admitted.  Continue to monitor Cr.  Time Spent in minutes   30 minutes  Procedures  Renal Bx  Consults   Nephrology  DVT Prophylaxis  SCDs  Lab Results  Component Value Date   PLT 236 08/02/2014    Medications  Scheduled  Meds: . amLODipine  10 mg Oral QHS  . bisacodyl  10 mg Rectal BID  . calcium acetate  667 mg Oral TID WC  . cloNIDine  0.1 mg Oral TID  . darbepoetin (ARANESP) injection - DIALYSIS  200 mcg Intravenous Q Thu-HD  . feeding supplement (NEPRO CARB STEADY)  237 mL Oral BID BM  . hydrALAZINE  50 mg Oral 3 times per day  . multivitamin  1 tablet Oral QHS  . neomycin-polymyxin b-dexamethasone  2 drop Right Eye Q12H  . pantoprazole  20 mg Oral Daily  . predniSONE  20 mg Oral QAC breakfast  . senna-docusate  1 tablet Oral BID  . sodium chloride  3 mL Intravenous Q12H   Continuous Infusions: . sodium chloride 10 mL/hr at 07/18/14 1400  . sodium chloride     PRN Meds:.acetaminophen, butalbital-acetaminophen-caffeine, hydrALAZINE, HYDROcodone-acetaminophen, morphine injection, ondansetron **OR** ondansetron (ZOFRAN) IV, oxyCODONE-acetaminophen, prochlorperazine  Antibiotics    Anti-infectives    Start     Dose/Rate Route Frequency Ordered Stop   07/24/14 1000  ceFAZolin (ANCEF) IVPB 2 g/50 mL premix    Comments:  Hang ON CALL to xray 7/20   2 g 100 mL/hr over 30 Minutes Intravenous On call 07/24/14 0908 07/24/14 1503      Subjective:   Erin Morgan seen and examined today.  Continues to complain of headache and nausea.  Denies chest pain or shortness of breath.   Objective:   Filed Vitals:   08/01/14 1910 08/01/14 2144 08/02/14 0016 08/02/14 0548  BP: 168/85 175/75 165/69 172/78  Pulse:  101  97  Temp:  99.1 F (37.3 C)  99.1 F (37.3 C)  TempSrc:      Resp:  18  18  Height:      Weight:      SpO2:  98%  97%    Wt Readings from Last 3 Encounters:  08/01/14 62.5 kg (137 lb 12.6 oz)  04/05/14 62.143 kg (137 lb)  05/21/12 59.875 kg (132 lb)     Intake/Output Summary (Last 24 hours) at 08/02/14 1309 Last data filed at 08/02/14 1055  Gross per 24 hour  Intake   1143 ml  Output   2375 ml  Net  -1232 ml    Exam  General: Well developed, well nourished,  NAD  HEENT: NCAT, mucous membranes moist.   Cardiovascular: S1 S2 auscultated, RRR, no murmurs  Respiratory: Clear to auscultation  Abdomen: Soft, nontender, nondistended, + bowel sounds  Extremities: warm dry without cyanosis clubbing. 2+ edema LE B/L, trace edema upper ext.   Data Review   Micro Results No results found for this or any previous visit (from the  past 240 hour(s)).  Radiology Reports Dg Shoulder Right  07/09/2014   CLINICAL DATA:  Right shoulder injury.  Fall.  EXAM: RIGHT SHOULDER - 2+ VIEW  COMPARISON:  None.  FINDINGS: Fracture of the greater tuberosity of the proximal right humerus is present. No other abnormality identified. No evidence of dislocation or separation.  IMPRESSION: Minimally displaced fracture of the greater tuberosity of the proximal right humerus.   Electronically Signed   By: Marcello Moores  Register   On: 07/09/2014 13:58   Ct Head Wo Contrast  07/17/2014   CLINICAL DATA:  59 year old female with headache, nausea and leukocytosis  EXAM: CT HEAD WITHOUT CONTRAST  TECHNIQUE: Contiguous axial images were obtained from the base of the skull through the vertex without intravenous contrast.  COMPARISON:  None.  FINDINGS: Negative for acute intracranial hemorrhage, acute infarction, mass, mass effect, hydrocephalus or midline shift. Gray-white differentiation is preserved throughout. No acute soft tissue or calvarial abnormality. The globes and orbits are symmetric and unremarkable. Normal aeration of the mastoid air cells and visualized paranasal sinuses. Small sclerotic focus in the right frontal calvarium just above the orbit likely represents a benign bone island.  IMPRESSION: Negative head CT.   Electronically Signed   By: Jacqulynn Cadet M.D.   On: 07/17/2014 17:04   Mr Brain Wo Contrast  07/18/2014   CLINICAL DATA:  Initial evaluation for acute headache.  EXAM: MRI HEAD WITHOUT CONTRAST  TECHNIQUE: Multiplanar, multiecho pulse sequences of the brain and  surrounding structures were obtained without intravenous contrast.  COMPARISON:  Prior CT from 07/17/2014  FINDINGS: The CSF containing spaces are within normal limits for patient age. Few scattered T2/FLAIR hyperintense foci present within the periventricular and deep white matter of both cerebral hemispheres, nonspecific. No mass lesion, midline shift, or extra-axial fluid collection. Ventricles are normal in size without evidence of hydrocephalus.  No diffusion-weighted signal abnormality is identified to suggest acute intracranial infarct. Gray-white matter differentiation is maintained. Normal flow voids are seen within the intracranial vasculature. No intracranial hemorrhage identified.  The cervicomedullary junction is normal. Pituitary gland is within normal limits. Pituitary stalk is midline. The globes and optic nerves demonstrate a normal appearance with normal signal intensity. The  The bone marrow signal intensity is normal. Calvarium is intact. Visualized upper cervical spine is within normal limits.  Scalp soft tissues are unremarkable.  Paranasal sinuses are clear.  No mastoid effusion.  IMPRESSION: 1. No acute intracranial process identified. 2. Few scattered subcentimeter T2/FLAIR height matter foci within the supratentorial white matter. These foci are nonspecific, but may be related to very mild chronic small vessel ischemic changes. These are felt to be within normal limits for patient age. 3. Otherwise normal brain MRI   Electronically Signed   By: Jeannine Boga M.D.   On: 07/18/2014 06:53   US Renal  07/18/2014   CLINICAL DATA:  Acute renal failure.  EXAM: RENAL / URINARY TRACT ULTRASOUND COMPLETE  COMPARISON:  None.  FINDINGS: Right Kidney:  Length: 10.0 cm. Echogenicity within normal limits. No mass or hydronephrosis visualized.  Left Kidney:  Length: 9.4 cm. Echogenicity within normal limits. No mass or hydronephrosis visualized.  Bladder:  Appears normal for degree of bladder  distention.  Small bilateral pleural effusions incidentally noted.  IMPRESSION: No evidence of hydronephrosis or other renal abnormality.  Small bilateral pleural effusions incidentally noted.   Electronically Signed   By: Earle Gell M.D.   On: 07/18/2014 11:55   US Biopsy  07/23/2014   CLINICAL  DATA:  Renal insufficiency  EXAM: ULTRASOUND BIOPSY  COMPARISON:  None.  FINDINGS: Imaging was provided to the Nephrology service for renal biopsy.  IMPRESSION: See above.   Electronically Signed   By: Marybelle Killings M.D.   On: 07/23/2014 13:26   Ir Fluoro Guide Cv Line Right  07/24/2014   CLINICAL DATA:  Acute renal failure, needs access for hemodialysis  EXAM: TUNNELED HEMODIALYSIS CATHETER PLACEMENT WITH ULTRASOUND AND FLUOROSCOPIC GUIDANCE  TECHNIQUE: The procedure, risks, benefits, and alternatives were explained to the patient. Questions regarding the procedure were encouraged and answered. The patient understands and consents to the procedure. As antibiotic prophylaxis, cefazolin 2 g was ordered pre-procedure and administered intravenously within one hour of incision.Patency of the right IJ vein was confirmed with ultrasound with image documentation. An appropriate skin site was determined. Region was prepped using maximum barrier technique including cap and mask, sterile gown, sterile gloves, large sterile sheet, and Chlorhexidine as cutaneous antisepsis. The region was infiltrated locally with 1% lidocaine.  Intravenous Fentanyl and Versed were administered as conscious sedation during continuous cardiorespiratory monitoring by the radiology RN, with a total moderate sedation time of 10 minutes.  Under real-time ultrasound guidance, the right IJ vein was accessed with a 21 gauge micropuncture needle; the needle tip within the vein was confirmed with ultrasound image documentation. Needle exchanged over the 018 guidewire for transitional dilator, which allowed advancement of a Benson wire into the IVC. Over  this, an MPA catheter was advanced. A Palindrome 19 hemodialysis catheter was tunneled from the right anterior chest wall approach to the right IJ dermatotomy site. The MPA catheter was exchanged over an Amplatz wire for serial vascular dilators which allow placement of a peel-away sheath, through which the catheter was advanced under intermittent fluoroscopy, positioned with its tips in the proximal and midright atrium. Spot chest radiograph confirms good catheter position. No pneumothorax. Catheter was flushed and primed per protocol. Catheter secured externally with O Prolene suture. The right IJ dermatotomy site was closed with Dermabond.  COMPLICATIONS: COMPLICATIONS None immediate  FLUOROSCOPY TIME:  12 seconds, 3.7 mGy  COMPARISON:  None  IMPRESSION: 1. Technically successful placement of tunneled right IJ hemodialysis catheter with ultrasound and fluoroscopic guidance. Ready for routine use.   Electronically Signed   By: Lucrezia Europe M.D.   On: 07/24/2014 15:16   Ir US Guide Vasc Access Right  07/24/2014   CLINICAL DATA:  Acute renal failure, needs access for hemodialysis  EXAM: TUNNELED HEMODIALYSIS CATHETER PLACEMENT WITH ULTRASOUND AND FLUOROSCOPIC GUIDANCE  TECHNIQUE: The procedure, risks, benefits, and alternatives were explained to the patient. Questions regarding the procedure were encouraged and answered. The patient understands and consents to the procedure. As antibiotic prophylaxis, cefazolin 2 g was ordered pre-procedure and administered intravenously within one hour of incision.Patency of the right IJ vein was confirmed with ultrasound with image documentation. An appropriate skin site was determined. Region was prepped using maximum barrier technique including cap and mask, sterile gown, sterile gloves, large sterile sheet, and Chlorhexidine as cutaneous antisepsis. The region was infiltrated locally with 1% lidocaine.  Intravenous Fentanyl and Versed were administered as conscious sedation  during continuous cardiorespiratory monitoring by the radiology RN, with a total moderate sedation time of 10 minutes.  Under real-time ultrasound guidance, the right IJ vein was accessed with a 21 gauge micropuncture needle; the needle tip within the vein was confirmed with ultrasound image documentation. Needle exchanged over the 018 guidewire for transitional dilator, which allowed advancement of a Britta Mccreedy  wire into the IVC. Over this, an MPA catheter was advanced. A Palindrome 19 hemodialysis catheter was tunneled from the right anterior chest wall approach to the right IJ dermatotomy site. The MPA catheter was exchanged over an Amplatz wire for serial vascular dilators which allow placement of a peel-away sheath, through which the catheter was advanced under intermittent fluoroscopy, positioned with its tips in the proximal and midright atrium. Spot chest radiograph confirms good catheter position. No pneumothorax. Catheter was flushed and primed per protocol. Catheter secured externally with O Prolene suture. The right IJ dermatotomy site was closed with Dermabond.  COMPLICATIONS: COMPLICATIONS None immediate  FLUOROSCOPY TIME:  12 seconds, 3.7 mGy  COMPARISON:  None  IMPRESSION: 1. Technically successful placement of tunneled right IJ hemodialysis catheter with ultrasound and fluoroscopic guidance. Ready for routine use.   Electronically Signed   By: Lucrezia Europe M.D.   On: 07/24/2014 15:16   Dg Humerus Right  07/09/2014   CLINICAL DATA:  Injury to the right shoulder and humerus after fall on July 03, 2014 with pain.  EXAM: RIGHT HUMERUS - 2+ VIEW  COMPARISON:  None.  FINDINGS: There is comminuted displaced fracture of the lateral aspect of the proximal right humerus. The visualized right ribs and right lung are normal.  IMPRESSION: Fracture of lateral aspect of proximal right humerus.   Electronically Signed   By: Abelardo Diesel M.D.   On: 07/09/2014 13:58   Dg Hip Unilat With Pelvis 2-3 Views  Right  07/09/2014   CLINICAL DATA:  Fall with injury to the right hip 07/03/2014. Pain and bruising. Decreased range of motion.  EXAM: RIGHT HIP (WITH PELVIS) 2-3 VIEWS  COMPARISON:  None.  FINDINGS: Right hip is located. No acute bone or soft tissue abnormality is present. The pelvis is intact.  IMPRESSION: Negative right hip radiographs.   Electronically Signed   By: San Morelle M.D.   On: 07/09/2014 14:13    CBC  Recent Labs Lab 07/30/14 0442 07/31/14 0501 08/01/14 0437 08/01/14 1230 08/02/14 0433  WBC 11.8* 11.8* 12.8* 12.5* 10.6*  HGB 8.3* 8.6* 9.2* 9.2* 9.2*  HCT 25.0* 26.0* 27.8* 27.2* 27.7*  PLT 247 253 279 257 236  MCV 93.6 93.2 92.4 91.3 92.6  MCH 31.1 30.8 30.6 30.9 30.8  MCHC 33.2 33.1 33.1 33.8 33.2  RDW 17.3* 17.2* 17.3* 16.9* 17.4*    Chemistries   Recent Labs Lab 07/30/14 0442 07/31/14 0501 08/01/14 0437 08/01/14 1230 08/02/14 0433  NA 122* 122* 122* 122* 126*  K 3.8 3.9 4.5 4.1 3.8  CL 89* 88* 90* 89* 91*  CO2 23 23 18* 22 23  GLUCOSE 86 88 79 91 85  BUN 38* 45* 52* 52* 32*  CREATININE 6.24* 6.99* 7.77* 8.01* 6.12*  CALCIUM 8.5* 8.7* 8.7* 8.6* 8.3*   ------------------------------------------------------------------------------------------------------------------ estimated creatinine clearance is 8.5 mL/min (by C-G formula based on Cr of 6.12). ------------------------------------------------------------------------------------------------------------------ No results for input(s): HGBA1C in the last 72 hours. ------------------------------------------------------------------------------------------------------------------ No results for input(s): CHOL, HDL, LDLCALC, TRIG, CHOLHDL, LDLDIRECT in the last 72 hours. ------------------------------------------------------------------------------------------------------------------ No results for input(s): TSH, T4TOTAL, T3FREE, THYROIDAB in the last 72 hours.  Invalid input(s):  FREET3 ------------------------------------------------------------------------------------------------------------------ No results for input(s): VITAMINB12, FOLATE, FERRITIN, TIBC, IRON, RETICCTPCT in the last 72 hours.  Coagulation profile No results for input(s): INR, PROTIME in the last 168 hours.  No results for input(s): DDIMER in the last 72 hours.  Cardiac Enzymes No results for input(s): CKMB, TROPONINI, MYOGLOBIN in the last 168 hours.  Invalid input(s): CK ------------------------------------------------------------------------------------------------------------------ Invalid input(s): POCBNP    Lorrene Graef D.O. on 08/02/2014 at 1:09 PM  Between 7am to 7pm - Pager - [phone]  After 7pm go to [email or web address] - password [email or web address]  And look for the night coverage person covering for me after hours  [facility] Office  [phone]

## 2014-08-03 LAB — CBC
HCT: 26.5 % — ABNORMAL LOW (ref 36.0–46.0)
Hemoglobin: 8.7 g/dL — ABNORMAL LOW (ref 12.0–15.0)
MCH: 29.7 pg (ref 26.0–34.0)
MCHC: 32.8 g/dL (ref 30.0–36.0)
MCV: 90.4 fL (ref 78.0–100.0)
PLATELETS: 209 10*3/uL (ref 150–400)
RBC: 2.93 MIL/uL — ABNORMAL LOW (ref 3.87–5.11)
RDW: 17.2 % — ABNORMAL HIGH (ref 11.5–15.5)
WBC: 8.7 10*3/uL (ref 4.0–10.5)

## 2014-08-03 LAB — BASIC METABOLIC PANEL
Anion gap: 14 (ref 5–15)
BUN: 39 mg/dL — AB (ref 6–20)
CHLORIDE: 87 mmol/L — AB (ref 101–111)
CO2: 20 mmol/L — ABNORMAL LOW (ref 22–32)
CREATININE: 7.14 mg/dL — AB (ref 0.44–1.00)
Calcium: 8.2 mg/dL — ABNORMAL LOW (ref 8.9–10.3)
GFR calc Af Amer: 7 mL/min — ABNORMAL LOW (ref >60)
GFR calc non Af Amer: 6 mL/min — ABNORMAL LOW (ref >60)
Glucose, Bld: 90 mg/dL (ref 65–99)
Potassium: 4 mmol/L (ref 3.5–5.1)
Sodium: 121 mmol/L — ABNORMAL LOW (ref 135–145)

## 2014-08-03 LAB — RENAL FUNCTION PANEL
Albumin: 3.2 g/dL — ABNORMAL LOW (ref 3.5–5.0)
Anion gap: 15 (ref 5–15)
BUN: 39 mg/dL — ABNORMAL HIGH (ref 6–20)
CALCIUM: 8.2 mg/dL — AB (ref 8.9–10.3)
CO2: 19 mmol/L — AB (ref 22–32)
CREATININE: 7.08 mg/dL — AB (ref 0.44–1.00)
Chloride: 87 mmol/L — ABNORMAL LOW (ref 101–111)
GFR calc Af Amer: 7 mL/min — ABNORMAL LOW (ref >60)
GFR, EST NON AFRICAN AMERICAN: 6 mL/min — AB (ref >60)
Glucose, Bld: 89 mg/dL (ref 65–99)
Phosphorus: 4.6 mg/dL (ref 2.5–4.6)
Potassium: 4 mmol/L (ref 3.5–5.1)
Sodium: 121 mmol/L — ABNORMAL LOW (ref 135–145)

## 2014-08-03 MED ORDER — CAPSAICIN 0.025 % EX CREA
TOPICAL_CREAM | Freq: Two times a day (BID) | CUTANEOUS | Status: DC
  Filled 2014-08-03: qty 56.6

## 2014-08-03 MED ORDER — CAPSICUM OLEORESIN 0.025 % EX CREA
TOPICAL_CREAM | Freq: Two times a day (BID) | CUTANEOUS | Status: DC
  Administered 2014-08-03 – 2014-08-04 (×3): 1 via TOPICAL
  Administered 2014-08-04 – 2014-08-06 (×3): via TOPICAL
  Administered 2014-08-06 – 2014-08-07 (×2): 1 via TOPICAL
  Administered 2014-08-07: 09:00:00 via TOPICAL
  Administered 2014-08-08: 1 via TOPICAL
  Administered 2014-08-08 – 2014-08-09 (×2): via TOPICAL
  Filled 2014-08-03: qty 60

## 2014-08-03 NOTE — Progress Notes (Signed)
Subjective:  Erin Morgan is a 59 yo female presenting with ARF with unknown etiology. Patient was seen and examined this morning. She continues to have nausea that is controlled with Zofran and headaches that are somewhat relieved by Fioricet.  Patient requesting information on different types of dialysis. I will provide her with reading material.   Objective: Filed Vitals:   08/02/14 1845 08/02/14 2056 08/03/14 0509 08/03/14 1024  BP: 153/82 146/77 135/67 156/71  Pulse: 79 80 79 81  Temp: 98.3 F (36.8 C) 98.3 F (36.8 C) 98.1 F (36.7 C) 98.6 F (37 C)  TempSrc: Oral Oral Oral Oral  Resp: 18 16 16 18   Height:      Weight:  142 lb 13.1 oz (64.782 kg)    SpO2: 99% 98% 95% 98%   General: Vital signs reviewed. Patient is in no acute distress and cooperative with exam.  Cardiovascular: RRR, S1 normal, S2 normal Pulmonary/Chest: Mild inspiratory crackles in lower lung fields, no wheezes, or rhonchi. Abdominal: Soft, non-tender, non-distended, BS +.   Extremities: 2+ lower extremity pitting edema bilaterally, pulses symmetric and intact bilaterally. Skin: Warm, dry and intact. No rashes or erythema.   Intake/Output from previous day: 07/29 0701 - 07/30 0700 In: 720 [P.O.:720] Out: 300 [Urine:300] Intake/Output this shift: Total I/O In: 600 [P.O.:600] Out: 300 [Urine:300]  Lab Results:  Recent Labs  08/02/14 0433 08/03/14 0428  WBC 10.6* 8.7  HGB 9.2* 8.7*  HCT 27.7* 26.5*  PLT 236 209   BMET:   Recent Labs  08/02/14 0433 08/03/14 0428  NA 126* 121*  121*  K 3.8 4.0  4.0  CL 91* 87*  87*  CO2 23 20*  19*  GLUCOSE 85 90  89  BUN 32* 39*  39*  CREATININE 6.12* 7.14*  7.08*  CALCIUM 8.3* 8.2*  8.2*   I have reviewed the patient's current medications. Prior to Admission:  Prescriptions prior to admission  Medication Sig Dispense Refill Last Dose  . CALCIUM PO Take 1 tablet by mouth daily.   Past Week at Unknown time  . Cholecalciferol 1000 UNITS  capsule Take 1,000 Units by mouth daily.   Past Week at Unknown time  . estradiol (VAGIFEM) 25 MCG vaginal tablet Place 25 mcg vaginally 2 (two) times a week.    Past Week at Unknown time  . folic acid (FOLVITE) 1 MG tablet Take 1 mg by mouth daily.   07/16/2014 at Unknown time  . hydrochlorothiazide (MICROZIDE) 12.5 MG capsule Take 12.5 mg by mouth as needed (for fluid).    07/16/2014 at Unknown time  . predniSONE (DELTASONE) 5 MG tablet Take 20 mg by mouth daily.   07/16/2014 at Unknown time  . HYDROcodone-acetaminophen (NORCO) 5-325 MG per tablet Take 1 tablet by mouth every 6 (six) hours as needed for moderate pain. 30 tablet 0    Scheduled: . amLODipine  10 mg Oral QHS  . bisacodyl  10 mg Rectal BID  . calcium acetate  667 mg Oral TID WC  . capsicum oleoresin   Topical BID  . cloNIDine  0.1 mg Oral TID  . darbepoetin (ARANESP) injection - DIALYSIS  200 mcg Intravenous Q Thu-HD  . feeding supplement (NEPRO CARB STEADY)  237 mL Oral BID BM  . hydrALAZINE  50 mg Oral 3 times per day  . multivitamin  1 tablet Oral QHS  . neomycin-polymyxin b-dexamethasone  2 drop Right Eye Q12H  . pantoprazole  20 mg Oral Daily  . predniSONE  20 mg Oral QAC breakfast  . senna-docusate  1 tablet Oral BID  . sodium chloride  3 mL Intravenous Q12H   Continuous: . sodium chloride 10 mL/hr at 07/18/14 1400  . sodium chloride     [id number], butalbital-acetaminophen-caffeine, hydrALAZINE, HYDROcodone-acetaminophen, morphine injection, ondansetron **OR** ondansetron (ZOFRAN) IV, oxyCODONE-acetaminophen, prochlorperazine  Assessment/Plan:   ARF: Presenting with ARF of unknown etiology. Renal biopsy results showed thrombotic microangiopathy. Further work up has been unremarkable making the most likely etiology hypertension. Last HD was on 7/28. Creatinine has trended back up from 6.12 to 7.14 today. CT abd/pelvis showed intermediate lesions posterior left kidney. Radiology recommends ultrasound and  possibly MRI for further workup which can be done as an outpatient. UOP -300 yesterday. We will continue to monitor over the weekend and by Monday we should know if patient will require long term HD.  -Renal function panel daily -Continue to monitor UOP -Consider US/MRI as outpatient -Vein mapping  Hypertension: Improved, 130s/60 this morning.  -Continue amlodipine 10 mg daily -Continue clonodine 0.1 mg TID -Continue Hydralazine 50 mg TID    Hyperphosphatemia: Phosphorous 4.6 this morning.  -PhosLo 667 mg TID WC  Iron Deficiency Anemia: Hemoglobin stable around 8-9.  -Monitor CBC intermittently  Hypervolemic Hyponatremia: Sodium this morning 121. Likely secondary to renal failure.  -Fluid restrict  RA: Holding methotrexate for now.  -Continue prednisone 20 mg daily   LOS: 17 days   Osa Craver, DO PGY-2 Internal Medicine Resident Pager # [phone] 08/03/2014 11:45 AM

## 2014-08-03 NOTE — Progress Notes (Signed)
[facility]                                                                              Patient Demographics  Erin Morgan, is a 59 y.o. female, DOB - August 06, 1955, QB:[id number]  Admit date - 07/17/2014   Admitting Physician Toy Baker, MD  Outpatient Primary MD for the patient is Nicola Girt, DO  LOS - 18   Chief Complaint  Patient presents with  . Headache  . Nausea  . Hypertension      HPI on 07/17/2014 by Dr. Toy Baker Presented with 3-4 months edema and generalized joint pain.  Patient was diagnosed with RA since MAY she was prednisone at first and then was started on methotrexate. But her blood work was abnormal with rising Cr and anemia. He methotrexate was stopped with last dose being almost 2 weeks ago. She have had a nausea for few weeks and continued to have significant edema. Her rheumatologist started her on HCTZ to help with swelling. She have had a headache off and on for few weeks but gotten persistently worse. Now it became constant. She had to wake up in the night to take additional tylenol. Presented to PCP and had blood work today showing WBC of 15. She was instructed to come to ER. CT of the head was unremarkable. CR was noted to be up to 3.78, HG 9.4 WBC on repeat was down to 10.4 Denies any fever or chills. 2 weeks ago she traveled to [place] where she had fallen and broke her right arm. She was seen by orthopedics and is wearing a splint.  She reports decreased PO intake today, have had decreased urine output last urination was few hours ago and reports no change in color. 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   Assessment & Plan   Nausea and vomiting - Improving, continue zofran PRN - continue to provide senna and add bisacodyl supp   Acute renal failure - renal biopsy with thrombotic microangiopathy, ? TTP (ADAMTS13 deficiency), HUS,  immune mediated or toxin mediated - ? malignant HTN - following tests still pending: ADAMTS14, LDH, haptoglobin - continue HD per nephrology team, assistance is appreciated  - patient dialyzed 08/01/2014 - Creatinine 7.08 - Spoke with Dr. Justin Mend, will continue to monitor and possibly discharge next week with outpatient dialysis - CT abd/pelvis w/o contrast: Cecal inflammation, indeterminate lesion extending posterior to the left kidney    Leukocytosis - Resolved, unclear etiology, pt is afebrile, possibly reactive and related to prednisone use  - no specific infectious etiology noted  - pt has refused XRAY   Rheumatoid arthritis  - Continue prednisone 20 mg daily  - holding MTX  - appears reasonably stable at present  - no focal joint complaints  Acute encephalopathy - resolved   Normocytic anemia - Iron studies suggest iron deficiency  - dosed with Feraheme 07/20/14 - Hg remains overall stable with no signs of active bleeding, currently 9.2  Hypervolemic Hyponatremia - Due to acute renal failure - Na down over the past 24 hours: 125 --> 122  - Continue to monitor BMP  Hypokalemia -  WNL this AM - daily renal panel   Hypertensive urgency  - currently on Norvasc, Hydralazine, Clonidine  - Patient had been on hydralazine prior to admission > anti-histone antibody negative - Echocardiogram notes no impairment in systolic fxn or WMA  Persistent waxing/waning HA  - Continue pain control as needed - orthostatic vitals negative   Inadequate oral intake in the setting of acute illness  - nutritionist consulted, recommendations appreciated   Right shoulder pain - Will order capsicum cream and heating pad  Code Status: Full  Family Communication: Parents at bedside  Disposition Plan: Admitted.  Continue to monitor Cr.  Time Spent in minutes   30 minutes  Procedures  Renal Bx  Consults   Nephrology  DVT Prophylaxis  SCDs  Lab Results  Component Value  Date   PLT 209 08/03/2014    Medications  Scheduled Meds: . amLODipine  10 mg Oral QHS  . bisacodyl  10 mg Rectal BID  . calcium acetate  667 mg Oral TID WC  . capsicum oleoresin   Topical BID  . cloNIDine  0.1 mg Oral TID  . darbepoetin (ARANESP) injection - DIALYSIS  200 mcg Intravenous Q Thu-HD  . feeding supplement (NEPRO CARB STEADY)  237 mL Oral BID BM  . hydrALAZINE  50 mg Oral 3 times per day  . multivitamin  1 tablet Oral QHS  . neomycin-polymyxin b-dexamethasone  2 drop Right Eye Q12H  . pantoprazole  20 mg Oral Daily  . predniSONE  20 mg Oral QAC breakfast  . senna-docusate  1 tablet Oral BID  . sodium chloride  3 mL Intravenous Q12H   Continuous Infusions: . sodium chloride 10 mL/hr at 07/18/14 1400  . sodium chloride     PRN Meds:.acetaminophen, butalbital-acetaminophen-caffeine, hydrALAZINE, HYDROcodone-acetaminophen, morphine injection, ondansetron **OR** ondansetron (ZOFRAN) IV, oxyCODONE-acetaminophen, prochlorperazine  Antibiotics    Anti-infectives    Start     Dose/Rate Route Frequency Ordered Stop   07/24/14 1000  ceFAZolin (ANCEF) IVPB 2 g/50 mL premix    Comments:  Hang ON CALL to xray 7/20   2 g 100 mL/hr over 30 Minutes Intravenous On call 07/24/14 0908 07/24/14 1503      Subjective:   Erin Morgan seen and examined today.  Patient feels her headache has improved.  Denies nausea this morning.  Does complain of right shoulder pain.  Denies chest pain, shortness of breath, abdominal pain.   Objective:   Filed Vitals:   08/02/14 0548 08/02/14 1845 08/02/14 2056 08/03/14 0509  BP: 172/78 153/82 146/77 135/67  Pulse: 97 79 80 79  Temp: 99.1 F (37.3 C) 98.3 F (36.8 C) 98.3 F (36.8 C) 98.1 F (36.7 C)  TempSrc:  Oral Oral Oral  Resp: 18 18 16 16   Height:      Weight:   64.782 kg (142 lb 13.1 oz)   SpO2: 97% 99% 98% 95%    Wt Readings from Last 3 Encounters:  08/02/14 64.782 kg (142 lb 13.1 oz)  04/05/14 62.143 kg (137 lb)    05/21/12 59.875 kg (132 lb)     Intake/Output Summary (Last 24 hours) at 08/03/14 0926 Last data filed at 08/03/14 0745  Gross per 24 hour  Intake    720 ml  Output    600 ml  Net    120 ml    Exam  General: Well developed, well nourished, NAD  HEENT: NCAT, mucous membranes moist.   Cardiovascular: S1 S2 auscultated, RRR, no murmurs  Respiratory: Clear  to auscultation  Abdomen: Soft, nontender, nondistended, + bowel sounds  Extremities: warm dry without cyanosis clubbing. Trace LE edema, trace RUE edema  Data Review   Micro Results No results found for this or any previous visit (from the past 240 hour(s)).  Radiology Reports Ct Abdomen Pelvis Wo Contrast  08/02/2014   CLINICAL DATA:  Acute renal failure.  Headache and nausea  EXAM: CT ABDOMEN AND PELVIS WITHOUT CONTRAST  TECHNIQUE: Multidetector CT imaging of the abdomen and pelvis was performed following the standard protocol without IV contrast.  COMPARISON:  None.  FINDINGS: Lower chest: Bilateral pleural effusions and basilar atelectasis.  Hepatobiliary: No focal hepatic lesion. No biliary duct dilatation. Gallbladder is normal. Common bile duct is normal.  Pancreas: Pancreas is normal. No ductal dilatation. No pancreatic inflammation.  Spleen: Scattered granulomata within the spleen liver.  Adrenals/urinary tract: Adrenal glands are normal. No hydronephrosis. There is a intermediate density lesion extending from the posterior mid aspect of the LEFT kidney measuring 16 mm on image 28, series 2.  Stomach/Bowel: Stomach, small bowel are normal. Normal appendix is seen on image 30, series 5. There is inflammation within the fat surrounding the cecum and to lesser degree the ascending colon. There is a serosal inflammation along the cecum. The more distal. Transverse and descending colon are normal. The rectum is normal.  Vascular/Lymphatic: Abdominal aorta is normal caliber. There is no retroperitoneal or periportal  lymphadenopathy. No pelvic lymphadenopathy.  Reproductive: Uterus and ovaries are normal.  Musculoskeletal: No aggressive osseous lesion.  Other: No free fluid.  IMPRESSION: 1. Inflammation primary involving the cecum. Differential would include ischemic colitis, infectious colitis and inflammatory bowel disease. 2. Indeterminate lesion extending posterior to the LEFT kidney. If the patient cannot receive IV contrast recommend ultrasound and potentially MRI for further evaluation.   Electronically Signed   By: Suzy Bouchard M.D.   On: 08/02/2014 18:48   Dg Shoulder Right  07/09/2014   CLINICAL DATA:  Right shoulder injury.  Fall.  EXAM: RIGHT SHOULDER - 2+ VIEW  COMPARISON:  None.  FINDINGS: Fracture of the greater tuberosity of the proximal right humerus is present. No other abnormality identified. No evidence of dislocation or separation.  IMPRESSION: Minimally displaced fracture of the greater tuberosity of the proximal right humerus.   Electronically Signed   By: Marcello Moores  Register   On: 07/09/2014 13:58   Ct Head Wo Contrast  07/17/2014   CLINICAL DATA:  59 year old female with headache, nausea and leukocytosis  EXAM: CT HEAD WITHOUT CONTRAST  TECHNIQUE: Contiguous axial images were obtained from the base of the skull through the vertex without intravenous contrast.  COMPARISON:  None.  FINDINGS: Negative for acute intracranial hemorrhage, acute infarction, mass, mass effect, hydrocephalus or midline shift. Gray-white differentiation is preserved throughout. No acute soft tissue or calvarial abnormality. The globes and orbits are symmetric and unremarkable. Normal aeration of the mastoid air cells and visualized paranasal sinuses. Small sclerotic focus in the right frontal calvarium just above the orbit likely represents a benign bone island.  IMPRESSION: Negative head CT.   Electronically Signed   By: Jacqulynn Cadet M.D.   On: 07/17/2014 17:04   Mr Brain Wo Contrast  07/18/2014   CLINICAL DATA:   Initial evaluation for acute headache.  EXAM: MRI HEAD WITHOUT CONTRAST  TECHNIQUE: Multiplanar, multiecho pulse sequences of the brain and surrounding structures were obtained without intravenous contrast.  COMPARISON:  Prior CT from 07/17/2014  FINDINGS: The CSF containing spaces are within normal limits  for patient age. Few scattered T2/FLAIR hyperintense foci present within the periventricular and deep white matter of both cerebral hemispheres, nonspecific. No mass lesion, midline shift, or extra-axial fluid collection. Ventricles are normal in size without evidence of hydrocephalus.  No diffusion-weighted signal abnormality is identified to suggest acute intracranial infarct. Gray-white matter differentiation is maintained. Normal flow voids are seen within the intracranial vasculature. No intracranial hemorrhage identified.  The cervicomedullary junction is normal. Pituitary gland is within normal limits. Pituitary stalk is midline. The globes and optic nerves demonstrate a normal appearance with normal signal intensity. The  The bone marrow signal intensity is normal. Calvarium is intact. Visualized upper cervical spine is within normal limits.  Scalp soft tissues are unremarkable.  Paranasal sinuses are clear.  No mastoid effusion.  IMPRESSION: 1. No acute intracranial process identified. 2. Few scattered subcentimeter T2/FLAIR height matter foci within the supratentorial white matter. These foci are nonspecific, but may be related to very mild chronic small vessel ischemic changes. These are felt to be within normal limits for patient age. 3. Otherwise normal brain MRI   Electronically Signed   By: Jeannine Boga M.D.   On: 07/18/2014 06:53   US Renal  07/18/2014   CLINICAL DATA:  Acute renal failure.  EXAM: RENAL / URINARY TRACT ULTRASOUND COMPLETE  COMPARISON:  None.  FINDINGS: Right Kidney:  Length: 10.0 cm. Echogenicity within normal limits. No mass or hydronephrosis visualized.  Left Kidney:   Length: 9.4 cm. Echogenicity within normal limits. No mass or hydronephrosis visualized.  Bladder:  Appears normal for degree of bladder distention.  Small bilateral pleural effusions incidentally noted.  IMPRESSION: No evidence of hydronephrosis or other renal abnormality.  Small bilateral pleural effusions incidentally noted.   Electronically Signed   By: Earle Gell M.D.   On: 07/18/2014 11:55   US Biopsy  07/23/2014   CLINICAL DATA:  Renal insufficiency  EXAM: ULTRASOUND BIOPSY  COMPARISON:  None.  FINDINGS: Imaging was provided to the Nephrology service for renal biopsy.  IMPRESSION: See above.   Electronically Signed   By: Marybelle Killings M.D.   On: 07/23/2014 13:26   Ir Fluoro Guide Cv Line Right  07/24/2014   CLINICAL DATA:  Acute renal failure, needs access for hemodialysis  EXAM: TUNNELED HEMODIALYSIS CATHETER PLACEMENT WITH ULTRASOUND AND FLUOROSCOPIC GUIDANCE  TECHNIQUE: The procedure, risks, benefits, and alternatives were explained to the patient. Questions regarding the procedure were encouraged and answered. The patient understands and consents to the procedure. As antibiotic prophylaxis, cefazolin 2 g was ordered pre-procedure and administered intravenously within one hour of incision.Patency of the right IJ vein was confirmed with ultrasound with image documentation. An appropriate skin site was determined. Region was prepped using maximum barrier technique including cap and mask, sterile gown, sterile gloves, large sterile sheet, and Chlorhexidine as cutaneous antisepsis. The region was infiltrated locally with 1% lidocaine.  Intravenous Fentanyl and Versed were administered as conscious sedation during continuous cardiorespiratory monitoring by the radiology RN, with a total moderate sedation time of 10 minutes.  Under real-time ultrasound guidance, the right IJ vein was accessed with a 21 gauge micropuncture needle; the needle tip within the vein was confirmed with ultrasound image  documentation. Needle exchanged over the 018 guidewire for transitional dilator, which allowed advancement of a Benson wire into the IVC. Over this, an MPA catheter was advanced. A Palindrome 19 hemodialysis catheter was tunneled from the right anterior chest wall approach to the right IJ dermatotomy site. The MPA catheter was  exchanged over an Amplatz wire for serial vascular dilators which allow placement of a peel-away sheath, through which the catheter was advanced under intermittent fluoroscopy, positioned with its tips in the proximal and midright atrium. Spot chest radiograph confirms good catheter position. No pneumothorax. Catheter was flushed and primed per protocol. Catheter secured externally with O Prolene suture. The right IJ dermatotomy site was closed with Dermabond.  COMPLICATIONS: COMPLICATIONS None immediate  FLUOROSCOPY TIME:  12 seconds, 3.7 mGy  COMPARISON:  None  IMPRESSION: 1. Technically successful placement of tunneled right IJ hemodialysis catheter with ultrasound and fluoroscopic guidance. Ready for routine use.   Electronically Signed   By: Lucrezia Europe M.D.   On: 07/24/2014 15:16   Ir US Guide Vasc Access Right  07/24/2014   CLINICAL DATA:  Acute renal failure, needs access for hemodialysis  EXAM: TUNNELED HEMODIALYSIS CATHETER PLACEMENT WITH ULTRASOUND AND FLUOROSCOPIC GUIDANCE  TECHNIQUE: The procedure, risks, benefits, and alternatives were explained to the patient. Questions regarding the procedure were encouraged and answered. The patient understands and consents to the procedure. As antibiotic prophylaxis, cefazolin 2 g was ordered pre-procedure and administered intravenously within one hour of incision.Patency of the right IJ vein was confirmed with ultrasound with image documentation. An appropriate skin site was determined. Region was prepped using maximum barrier technique including cap and mask, sterile gown, sterile gloves, large sterile sheet, and Chlorhexidine as  cutaneous antisepsis. The region was infiltrated locally with 1% lidocaine.  Intravenous Fentanyl and Versed were administered as conscious sedation during continuous cardiorespiratory monitoring by the radiology RN, with a total moderate sedation time of 10 minutes.  Under real-time ultrasound guidance, the right IJ vein was accessed with a 21 gauge micropuncture needle; the needle tip within the vein was confirmed with ultrasound image documentation. Needle exchanged over the 018 guidewire for transitional dilator, which allowed advancement of a Benson wire into the IVC. Over this, an MPA catheter was advanced. A Palindrome 19 hemodialysis catheter was tunneled from the right anterior chest wall approach to the right IJ dermatotomy site. The MPA catheter was exchanged over an Amplatz wire for serial vascular dilators which allow placement of a peel-away sheath, through which the catheter was advanced under intermittent fluoroscopy, positioned with its tips in the proximal and midright atrium. Spot chest radiograph confirms good catheter position. No pneumothorax. Catheter was flushed and primed per protocol. Catheter secured externally with O Prolene suture. The right IJ dermatotomy site was closed with Dermabond.  COMPLICATIONS: COMPLICATIONS None immediate  FLUOROSCOPY TIME:  12 seconds, 3.7 mGy  COMPARISON:  None  IMPRESSION: 1. Technically successful placement of tunneled right IJ hemodialysis catheter with ultrasound and fluoroscopic guidance. Ready for routine use.   Electronically Signed   By: Lucrezia Europe M.D.   On: 07/24/2014 15:16   Dg Humerus Right  07/09/2014   CLINICAL DATA:  Injury to the right shoulder and humerus after fall on July 03, 2014 with pain.  EXAM: RIGHT HUMERUS - 2+ VIEW  COMPARISON:  None.  FINDINGS: There is comminuted displaced fracture of the lateral aspect of the proximal right humerus. The visualized right ribs and right lung are normal.  IMPRESSION: Fracture of lateral aspect of  proximal right humerus.   Electronically Signed   By: Abelardo Diesel M.D.   On: 07/09/2014 13:58   Dg Hip Unilat With Pelvis 2-3 Views Right  07/09/2014   CLINICAL DATA:  Fall with injury to the right hip 07/03/2014. Pain and bruising. Decreased range of motion.  EXAM: RIGHT HIP (WITH PELVIS) 2-3 VIEWS  COMPARISON:  None.  FINDINGS: Right hip is located. No acute bone or soft tissue abnormality is present. The pelvis is intact.  IMPRESSION: Negative right hip radiographs.   Electronically Signed   By: San Morelle M.D.   On: 07/09/2014 14:13    CBC  Recent Labs Lab 07/31/14 0501 08/01/14 0437 08/01/14 1230 08/02/14 0433 08/03/14 0428  WBC 11.8* 12.8* 12.5* 10.6* 8.7  HGB 8.6* 9.2* 9.2* 9.2* 8.7*  HCT 26.0* 27.8* 27.2* 27.7* 26.5*  PLT 253 279 257 236 209  MCV 93.2 92.4 91.3 92.6 90.4  MCH 30.8 30.6 30.9 30.8 29.7  MCHC 33.1 33.1 33.8 33.2 32.8  RDW 17.2* 17.3* 16.9* 17.4* 17.2*    Chemistries   Recent Labs Lab 07/31/14 0501 08/01/14 0437 08/01/14 1230 08/02/14 0433 08/03/14 0428  NA 122* 122* 122* 126* 121*  121*  K 3.9 4.5 4.1 3.8 4.0  4.0  CL 88* 90* 89* 91* 87*  87*  CO2 23 18* 22 23 20*  19*  GLUCOSE 88 79 91 85 90  89  BUN 45* 52* 52* 32* 39*  39*  CREATININE 6.99* 7.77* 8.01* 6.12* 7.14*  7.08*  CALCIUM 8.7* 8.7* 8.6* 8.3* 8.2*  8.2*   ------------------------------------------------------------------------------------------------------------------ estimated creatinine clearance is 7.4 mL/min (by C-G formula based on Cr of 7.08). ------------------------------------------------------------------------------------------------------------------ No results for input(s): HGBA1C in the last 72 hours. ------------------------------------------------------------------------------------------------------------------ No results for input(s): CHOL, HDL, LDLCALC, TRIG, CHOLHDL, LDLDIRECT in the last 72  hours. ------------------------------------------------------------------------------------------------------------------ No results for input(s): TSH, T4TOTAL, T3FREE, THYROIDAB in the last 72 hours.  Invalid input(s): FREET3 ------------------------------------------------------------------------------------------------------------------ No results for input(s): VITAMINB12, FOLATE, FERRITIN, TIBC, IRON, RETICCTPCT in the last 72 hours.  Coagulation profile No results for input(s): INR, PROTIME in the last 168 hours.  No results for input(s): DDIMER in the last 72 hours.  Cardiac Enzymes No results for input(s): CKMB, TROPONINI, MYOGLOBIN in the last 168 hours.  Invalid input(s): CK ------------------------------------------------------------------------------------------------------------------ Invalid input(s): POCBNP    Shelton Soler D.O. on 08/03/2014 at 9:26 AM  Between 7am to 7pm - Pager - [phone]  After 7pm go to [email or web address] - password [email or web address]  And look for the night coverage person covering for me after hours  [facility] Office  [phone]

## 2014-08-04 ENCOUNTER — Inpatient Hospital Stay (HOSPITAL_COMMUNITY): Payer: BC Managed Care – PPO

## 2014-08-04 ENCOUNTER — Encounter (HOSPITAL_COMMUNITY): Payer: BC Managed Care – PPO

## 2014-08-04 DIAGNOSIS — J189 Pneumonia, unspecified organism: Secondary | ICD-10-CM

## 2014-08-04 LAB — CBC
HCT: 30.2 % — ABNORMAL LOW (ref 36.0–46.0)
Hemoglobin: 10.1 g/dL — ABNORMAL LOW (ref 12.0–15.0)
MCH: 30.1 pg (ref 26.0–34.0)
MCHC: 33.4 g/dL (ref 30.0–36.0)
MCV: 89.9 fL (ref 78.0–100.0)
Platelets: 226 10*3/uL (ref 150–400)
RBC: 3.36 MIL/uL — ABNORMAL LOW (ref 3.87–5.11)
RDW: 17.3 % — ABNORMAL HIGH (ref 11.5–15.5)
WBC: 13.2 10*3/uL — ABNORMAL HIGH (ref 4.0–10.5)

## 2014-08-04 LAB — RENAL FUNCTION PANEL
Albumin: 3.6 g/dL (ref 3.5–5.0)
Anion gap: 15 (ref 5–15)
BUN: 53 mg/dL — ABNORMAL HIGH (ref 6–20)
CHLORIDE: 85 mmol/L — AB (ref 101–111)
CO2: 20 mmol/L — ABNORMAL LOW (ref 22–32)
Calcium: 8.5 mg/dL — ABNORMAL LOW (ref 8.9–10.3)
Creatinine, Ser: 7.99 mg/dL — ABNORMAL HIGH (ref 0.44–1.00)
GFR, EST AFRICAN AMERICAN: 6 mL/min — AB (ref >60)
GFR, EST NON AFRICAN AMERICAN: 5 mL/min — AB (ref >60)
Glucose, Bld: 93 mg/dL (ref 65–99)
Phosphorus: 4.1 mg/dL (ref 2.5–4.6)
Potassium: 3.7 mmol/L (ref 3.5–5.1)
Sodium: 120 mmol/L — ABNORMAL LOW (ref 135–145)

## 2014-08-04 MED ORDER — NEPRO/CARBSTEADY PO LIQD: 237.0000 mL | ORAL | Status: DC | PRN

## 2014-08-04 MED ORDER — LIDOCAINE-PRILOCAINE 2.5-2.5 % EX CREA
1.0000 "application " | TOPICAL_CREAM | CUTANEOUS | Status: DC | PRN
  Filled 2014-08-04: qty 5

## 2014-08-04 MED ORDER — LIDOCAINE HCL (PF) 1 % IJ SOLN: 5.0000 mL | INTRAMUSCULAR | Status: DC | PRN

## 2014-08-04 MED ORDER — PENTAFLUOROPROP-TETRAFLUOROETH EX AERO
1.0000 | INHALATION_SPRAY | CUTANEOUS | Status: DC | PRN
Start: 2014-08-04 — End: 2014-08-04

## 2014-08-04 MED ORDER — SODIUM CHLORIDE 0.9 % IV SOLN: 100.0000 mL | INTRAVENOUS | Status: DC | PRN

## 2014-08-04 MED ORDER — NEPRO/CARBSTEADY PO LIQD
237.0000 mL | ORAL | Status: DC | PRN
  Filled 2014-08-04: qty 237

## 2014-08-04 MED ORDER — TECHNETIUM TO 99M ALBUMIN AGGREGATED
5.8000 | Freq: Once | INTRAVENOUS | Status: AC | PRN
  Administered 2014-08-04: 6 via INTRAVENOUS

## 2014-08-04 MED ORDER — HEPARIN SODIUM (PORCINE) 1000 UNIT/ML DIALYSIS: 1000.0000 [IU] | INTRAMUSCULAR | Status: DC | PRN

## 2014-08-04 MED ORDER — LIDOCAINE-PRILOCAINE 2.5-2.5 % EX CREA: 1.0000 "application " | TOPICAL_CREAM | CUTANEOUS | Status: DC | PRN

## 2014-08-04 MED ORDER — ALTEPLASE 2 MG IJ SOLR
2.0000 mg | Freq: Once | INTRAMUSCULAR | Status: DC | PRN
Start: 2014-08-04 — End: 2014-08-04
  Filled 2014-08-04 (×2): qty 2

## 2014-08-04 MED ORDER — LEVOFLOXACIN 750 MG PO TABS
750.0000 mg | ORAL_TABLET | Freq: Once | ORAL | Status: AC
  Administered 2014-08-04: 750 mg via ORAL
  Filled 2014-08-04: qty 1

## 2014-08-04 MED ORDER — PENTAFLUOROPROP-TETRAFLUOROETH EX AERO: 1.0000 "application " | INHALATION_SPRAY | CUTANEOUS | Status: DC | PRN

## 2014-08-04 MED ORDER — LEVOFLOXACIN 500 MG PO TABS
500.0000 mg | ORAL_TABLET | ORAL | Status: DC
  Administered 2014-08-06 – 2014-08-08 (×2): 500 mg via ORAL
  Filled 2014-08-04 (×3): qty 1

## 2014-08-04 MED ORDER — ALTEPLASE 2 MG IJ SOLR
2.0000 mg | Freq: Once | INTRAMUSCULAR | Status: DC | PRN
  Filled 2014-08-04: qty 2

## 2014-08-04 MED ORDER — TECHNETIUM TC 99M DIETHYLENETRIAME-PENTAACETIC ACID
38.5000 | Freq: Once | INTRAVENOUS | Status: AC | PRN
Start: 2014-08-04 — End: 2014-08-04

## 2014-08-04 NOTE — Progress Notes (Addendum)
Pt has a non-functional HD catheter, nephrologist has been informed and received orders to TPA(cath-flo) access sites for 6hrs.Will return pt back to her room and will attempt to dialyze later this pm. Reports given to [facility] [place],RN

## 2014-08-04 NOTE — Progress Notes (Signed)
ANTIBIOTIC CONSULT NOTE - INITIAL  Pharmacy Consult for levaquin Indication: PNA  No Known Allergies  Patient Measurements: Height: 5\' 4"  (162.6 cm) Weight: 142 lb 13.1 oz (64.782 kg) IBW/kg (Calculated) : 54.7 Adjusted Body Weight:   Vital Signs: Temp: 98 F (36.7 C) (07/31 0517) Temp Source: Oral (07/31 0517) BP: 169/76 mmHg (07/31 0517) Pulse Rate: 93 (07/31 0517) Intake/Output from previous day: 07/30 0701 - 07/31 0700 In: 600 [P.O.:600] Out: 300 [Urine:300] Intake/Output from this shift:    Labs:  Recent Labs  08/02/14 0433 08/03/14 0428 08/04/14 0539  WBC 10.6* 8.7 13.2*  HGB 9.2* 8.7* 10.1*  PLT 236 209 226  CREATININE 6.12* 7.14*  7.08* 7.99*   Estimated Creatinine Clearance: 6.5 mL/min (by C-G formula based on Cr of 7.99). No results for input(s): VANCOTROUGH, VANCOPEAK, VANCORANDOM, GENTTROUGH, GENTPEAK, GENTRANDOM, TOBRATROUGH, TOBRAPEAK, TOBRARND, AMIKACINPEAK, AMIKACINTROU, AMIKACIN in the last 72 hours.   Microbiology: Recent Results (from the past 720 hour(s))  Urine culture     Status: None   Collection Time: 07/17/14  9:15 PM  Result Value Ref Range Status   Specimen Description URINE, CLEAN CATCH  Final   Special Requests NONE  Final   Culture   Final    MULTIPLE SPECIES PRESENT, SUGGEST RECOLLECTION IF CLINICALLY INDICATED   Report Status 07/19/2014 FINAL  Final  MRSA PCR Screening     Status: None   Collection Time: 07/17/14 11:09 PM  Result Value Ref Range Status   MRSA by PCR NEGATIVE NEGATIVE Final    Comment:        The GeneXpert MRSA Assay (FDA approved for NASAL specimens only), is one component of a comprehensive MRSA colonization surveillance program. It is not intended to diagnose MRSA infection nor to guide or monitor treatment for MRSA infections.     Medical History: Past Medical History  Diagnosis Date  . Wears glasses   . Osteopenia   . Seasonal allergies   . PONV (postoperative nausea and vomiting)      Medications:  Anti-infectives    Start     Dose/Rate Route Frequency Ordered Stop   08/06/14 1000  levofloxacin (LEVAQUIN) tablet 500 mg     500 mg Oral Every 48 hours 08/04/14 1100     08/04/14 1130  levofloxacin (LEVAQUIN) tablet 750 mg     750 mg Oral  Once 08/04/14 1100     07/24/14 1000  ceFAZolin (ANCEF) IVPB 2 g/50 mL premix    Comments:  Hang ON CALL to xray 7/20   2 g 100 mL/hr over 30 Minutes Intravenous On call 07/24/14 0908 07/24/14 1503     Assessment: 66 yof initially presented to the hospital with HA, N/V. Also with acute renal failure requiring dialysis. Unclear if pt will need long-term HD at this point or not. To start empiric levaquin for treatment of possible pneumonia. Pt is afebrile and WBC is 13.2.   Goal of Therapy:  Eradication of infxn  Plan:  - Levaquin 750mg  PO x 1 then 500mg  PO Q48H - F/u renal plans, C&S, clinical status and LOT  *Pharmacy will sign-off as no dose adjustments are anticipated. Thank you for the consult and please reconsult if needed!  Salome Arnt, PharmD, BCPS Pager # [phone] 08/04/2014 11:03 AM

## 2014-08-04 NOTE — Progress Notes (Signed)
[facility]                                                                              Patient Demographics  Erin Morgan, is a 59 y.o. female, DOB - 1955-10-26, MH:[id number]  Admit date - 07/17/2014   Admitting Physician Toy Baker, MD  Outpatient Primary MD for the patient is Nicola Girt, DO  LOS - 18   Chief Complaint  Patient presents with  . Headache  . Nausea  . Hypertension      HPI on 07/17/2014 by Dr. Toy Baker Presented with 3-4 months edema and generalized joint pain.  Patient was diagnosed with RA since MAY she was prednisone at first and then was started on methotrexate. But her blood work was abnormal with rising Cr and anemia. He methotrexate was stopped with last dose being almost 2 weeks ago. She have had a nausea for few weeks and continued to have significant edema. Her rheumatologist started her on HCTZ to help with swelling. She have had a headache off and on for few weeks but gotten persistently worse. Now it became constant. She had to wake up in the night to take additional tylenol. Presented to PCP and had blood work today showing WBC of 15. She was instructed to come to ER. CT of the head was unremarkable. CR was noted to be up to 3.78, HG 9.4 WBC on repeat was down to 10.4 Denies any fever or chills. 2 weeks ago she traveled to [place] where she had fallen and broke her right arm. She was seen by orthopedics and is wearing a splint.  She reports decreased PO intake today, have had decreased urine output last urination was few hours ago and reports no change in color. 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   Assessment & Plan   Acute progressive renal failure/?ESRD - renal biopsy with thrombotic microangiopathy, ? TTP (ADAMTS13 deficiency), HUS, immune mediated or toxin mediated - ? malignant HTN - Nephrology consulted and appreciated -  patient dialyzed 08/01/2014 - Creatinine 7.99 - Spoke with Dr. Justin Mend, will continue to monitor and possibly discharge next week with outpatient dialysis - CT abd/pelvis w/o contrast: Cecal inflammation, indeterminate lesion extending posterior to the left kidney    HCAP with leukocytosis - Overnight, patient became dyspneic, without tachycardia or tachypnea - VQ scan ordered and showed no pulmonary embolism, was normal - Chest x-ray: Left lower lobe opacity with left pleural effusion - Will place on levaquin PO per pharmacy - was placed on supplemental O2 overnight, however is maintaining her sats at 97% on Room air  Nausea and vomiting - Improving, continue zofran PRN - continue to provide senna and add bisacodyl supp   Rheumatoid arthritis  - Continue prednisone 20 mg daily  - holding MTX  - appears reasonably stable at present  - no focal joint complaints  Acute encephalopathy - resolved   Normocytic anemia - Iron studies suggest iron deficiency  - dosed with Feraheme 07/20/14 - Hg remains overall stable with no signs of active bleeding, currently 10.1  Hypervolemic Hyponatremia - Due to acute renal failure - Na  120 - Continue to monitor BMP  Hypokalemia - WNL this AM - daily renal panel   Hypertensive urgency  - currently on Norvasc, Hydralazine, Clonidine  - Patient had been on hydralazine prior to admission > anti-histone antibody negative - Echocardiogram notes no impairment in systolic fxn or WMA  Persistent waxing/waning HA  - Continue pain control as needed - orthostatic vitals negative   Inadequate oral intake in the setting of acute illness  - nutritionist consulted, recommendations appreciated   Right shoulder pain/fracture - Improved, continue capsicum cream and heating pad - will order repeat xray as patient had a fracture ~4-6 weeks ago  Code Status: Full  Family Communication: Parents at bedside  Disposition Plan: Admitted.  Continue  to monitor Cr.  Time Spent in minutes   30 minutes  Procedures  Renal Bx  Consults   Nephrology  DVT Prophylaxis  SCDs  Lab Results  Component Value Date   PLT 226 08/04/2014    Medications  Scheduled Meds: . amLODipine  10 mg Oral QHS  . bisacodyl  10 mg Rectal BID  . calcium acetate  667 mg Oral TID WC  . capsicum oleoresin   Topical BID  . cloNIDine  0.1 mg Oral TID  . darbepoetin (ARANESP) injection - DIALYSIS  200 mcg Intravenous Q Thu-HD  . feeding supplement (NEPRO CARB STEADY)  237 mL Oral BID BM  . hydrALAZINE  50 mg Oral 3 times per day  . [START ON 08/06/2014] levofloxacin  500 mg Oral Q48H  . multivitamin  1 tablet Oral QHS  . neomycin-polymyxin b-dexamethasone  2 drop Right Eye Q12H  . pantoprazole  20 mg Oral Daily  . predniSONE  20 mg Oral QAC breakfast  . senna-docusate  1 tablet Oral BID  . sodium chloride  3 mL Intravenous Q12H   Continuous Infusions: . sodium chloride 10 mL/hr at 07/18/14 1400  . sodium chloride     PRN Meds:.acetaminophen, butalbital-acetaminophen-caffeine, hydrALAZINE, HYDROcodone-acetaminophen, morphine injection, ondansetron **OR** ondansetron (ZOFRAN) IV, oxyCODONE-acetaminophen, prochlorperazine, technetium TC 74M diethylenetriame-pentaacetic acid  Antibiotics    Anti-infectives    Start     Dose/Rate Route Frequency Ordered Stop   08/06/14 1000  levofloxacin (LEVAQUIN) tablet 500 mg     500 mg Oral Every 48 hours 08/04/14 1100     08/04/14 1130  levofloxacin (LEVAQUIN) tablet 750 mg     750 mg Oral  Once 08/04/14 1100 08/04/14 1108   07/24/14 1000  ceFAZolin (ANCEF) IVPB 2 g/50 mL premix    Comments:  Hang ON CALL to xray 7/20   2 g 100 mL/hr over 30 Minutes Intravenous On call 07/24/14 0908 07/24/14 1503      Subjective:   Erin Morgan seen and examined today.  Patient feels her headache and nausea have improved. Feels right shoulder pain has improved.  Overnight, had shortness of breath.  Denies chest pain,  abdominal pain. Also feels volume overloaded today.  Objective:   Filed Vitals:   08/03/14 2214 08/04/14 0329 08/04/14 0517 08/04/14 1114  BP: 169/82 170/81 169/76 182/78  Pulse: 79 84 93 79  Temp:  98 F (36.7 C) 98 F (36.7 C) 98.3 F (36.8 C)  TempSrc:  Oral Oral Oral  Resp: 15 16 18 18   Height:      Weight:      SpO2:  96% 96% 97%    Wt Readings from Last 3 Encounters:  08/02/14 64.782 kg (142 lb 13.1 oz)  04/05/14 62.143 kg (137 lb)  05/21/12 59.875 kg (132 lb)     Intake/Output Summary (Last 24 hours) at 08/04/14 1153 Last data filed at 08/04/14 1146  Gross per 24 hour  Intake    240 ml  Output    200 ml  Net     40 ml    Exam  General: Well developed, well nourished, NAD  HEENT: NCAT, mucous membranes moist.   Cardiovascular: S1 S2 auscultated, RRR, no murmurs  Respiratory: Clear to auscultation  Abdomen: Soft, nontender, nondistended, + bowel sounds  Extremities: warm dry without cyanosis clubbing. Trace edema  Data Review   Micro Results No results found for this or any previous visit (from the past 240 hour(s)).  Radiology Reports Ct Abdomen Pelvis Wo Contrast  08/02/2014   CLINICAL DATA:  Acute renal failure.  Headache and nausea  EXAM: CT ABDOMEN AND PELVIS WITHOUT CONTRAST  TECHNIQUE: Multidetector CT imaging of the abdomen and pelvis was performed following the standard protocol without IV contrast.  COMPARISON:  None.  FINDINGS: Lower chest: Bilateral pleural effusions and basilar atelectasis.  Hepatobiliary: No focal hepatic lesion. No biliary duct dilatation. Gallbladder is normal. Common bile duct is normal.  Pancreas: Pancreas is normal. No ductal dilatation. No pancreatic inflammation.  Spleen: Scattered granulomata within the spleen liver.  Adrenals/urinary tract: Adrenal glands are normal. No hydronephrosis. There is a intermediate density lesion extending from the posterior mid aspect of the LEFT kidney measuring 16 mm on image 28, series  2.  Stomach/Bowel: Stomach, small bowel are normal. Normal appendix is seen on image 30, series 5. There is inflammation within the fat surrounding the cecum and to lesser degree the ascending colon. There is a serosal inflammation along the cecum. The more distal. Transverse and descending colon are normal. The rectum is normal.  Vascular/Lymphatic: Abdominal aorta is normal caliber. There is no retroperitoneal or periportal lymphadenopathy. No pelvic lymphadenopathy.  Reproductive: Uterus and ovaries are normal.  Musculoskeletal: No aggressive osseous lesion.  Other: No free fluid.  IMPRESSION: 1. Inflammation primary involving the cecum. Differential would include ischemic colitis, infectious colitis and inflammatory bowel disease. 2. Indeterminate lesion extending posterior to the LEFT kidney. If the patient cannot receive IV contrast recommend ultrasound and potentially MRI for further evaluation.   Electronically Signed   By: Suzy Bouchard M.D.   On: 08/02/2014 18:48   Dg Shoulder Right  07/09/2014   CLINICAL DATA:  Right shoulder injury.  Fall.  EXAM: RIGHT SHOULDER - 2+ VIEW  COMPARISON:  None.  FINDINGS: Fracture of the greater tuberosity of the proximal right humerus is present. No other abnormality identified. No evidence of dislocation or separation.  IMPRESSION: Minimally displaced fracture of the greater tuberosity of the proximal right humerus.   Electronically Signed   By: Marcello Moores  Register   On: 07/09/2014 13:58   Ct Head Wo Contrast  07/17/2014   CLINICAL DATA:  59 year old female with headache, nausea and leukocytosis  EXAM: CT HEAD WITHOUT CONTRAST  TECHNIQUE: Contiguous axial images were obtained from the base of the skull through the vertex without intravenous contrast.  COMPARISON:  None.  FINDINGS: Negative for acute intracranial hemorrhage, acute infarction, mass, mass effect, hydrocephalus or midline shift. Gray-white differentiation is preserved throughout. No acute soft tissue or  calvarial abnormality. The globes and orbits are symmetric and unremarkable. Normal aeration of the mastoid air cells and visualized paranasal sinuses. Small sclerotic focus in the right frontal calvarium just above the orbit likely represents a benign bone island.  IMPRESSION: Negative head  CT.   Electronically Signed   By: Jacqulynn Cadet M.D.   On: 07/17/2014 17:04   Mr Brain Wo Contrast  07/18/2014   CLINICAL DATA:  Initial evaluation for acute headache.  EXAM: MRI HEAD WITHOUT CONTRAST  TECHNIQUE: Multiplanar, multiecho pulse sequences of the brain and surrounding structures were obtained without intravenous contrast.  COMPARISON:  Prior CT from 07/17/2014  FINDINGS: The CSF containing spaces are within normal limits for patient age. Few scattered T2/FLAIR hyperintense foci present within the periventricular and deep white matter of both cerebral hemispheres, nonspecific. No mass lesion, midline shift, or extra-axial fluid collection. Ventricles are normal in size without evidence of hydrocephalus.  No diffusion-weighted signal abnormality is identified to suggest acute intracranial infarct. Gray-white matter differentiation is maintained. Normal flow voids are seen within the intracranial vasculature. No intracranial hemorrhage identified.  The cervicomedullary junction is normal. Pituitary gland is within normal limits. Pituitary stalk is midline. The globes and optic nerves demonstrate a normal appearance with normal signal intensity. The  The bone marrow signal intensity is normal. Calvarium is intact. Visualized upper cervical spine is within normal limits.  Scalp soft tissues are unremarkable.  Paranasal sinuses are clear.  No mastoid effusion.  IMPRESSION: 1. No acute intracranial process identified. 2. Few scattered subcentimeter T2/FLAIR height matter foci within the supratentorial white matter. These foci are nonspecific, but may be related to very mild chronic small vessel ischemic changes.  These are felt to be within normal limits for patient age. 3. Otherwise normal brain MRI   Electronically Signed   By: Jeannine Boga M.D.   On: 07/18/2014 06:53   US Renal  07/18/2014   CLINICAL DATA:  Acute renal failure.  EXAM: RENAL / URINARY TRACT ULTRASOUND COMPLETE  COMPARISON:  None.  FINDINGS: Right Kidney:  Length: 10.0 cm. Echogenicity within normal limits. No mass or hydronephrosis visualized.  Left Kidney:  Length: 9.4 cm. Echogenicity within normal limits. No mass or hydronephrosis visualized.  Bladder:  Appears normal for degree of bladder distention.  Small bilateral pleural effusions incidentally noted.  IMPRESSION: No evidence of hydronephrosis or other renal abnormality.  Small bilateral pleural effusions incidentally noted.   Electronically Signed   By: Earle Gell M.D.   On: 07/18/2014 11:55   Nm Pulmonary Perf And Vent  08/04/2014   CLINICAL DATA:  Acute shortness of breath.  EXAM: NUCLEAR MEDICINE VENTILATION - PERFUSION LUNG SCAN  TECHNIQUE: Ventilation images were obtained in multiple projections using inhaled aerosol Tc-24m DTPA. Perfusion images were obtained in multiple projections after intravenous injection of Tc-5m MAA.  RADIOPHARMACEUTICALS:  38.5 Technetium-39m DTPA aerosol inhalation and 5.9 Technetium-80m MAA IV  COMPARISON:  08/04/2014 chest radiograph  FINDINGS: Ventilation: No focal ventilation defect.  Perfusion: No wedge shaped peripheral perfusion defects to suggest acute pulmonary embolism.  IMPRESSION: Normal exam.   Electronically Signed   By: Margarette Canada M.D.   On: 08/04/2014 07:05   US Biopsy  07/23/2014   CLINICAL DATA:  Renal insufficiency  EXAM: ULTRASOUND BIOPSY  COMPARISON:  None.  FINDINGS: Imaging was provided to the Nephrology service for renal biopsy.  IMPRESSION: See above.   Electronically Signed   By: Marybelle Killings M.D.   On: 07/23/2014 13:26   Ir Fluoro Guide Cv Line Right  07/24/2014   CLINICAL DATA:  Acute renal failure, needs access for  hemodialysis  EXAM: TUNNELED HEMODIALYSIS CATHETER PLACEMENT WITH ULTRASOUND AND FLUOROSCOPIC GUIDANCE  TECHNIQUE: The procedure, risks, benefits, and alternatives were explained to the patient.  Questions regarding the procedure were encouraged and answered. The patient understands and consents to the procedure. As antibiotic prophylaxis, cefazolin 2 g was ordered pre-procedure and administered intravenously within one hour of incision.Patency of the right IJ vein was confirmed with ultrasound with image documentation. An appropriate skin site was determined. Region was prepped using maximum barrier technique including cap and mask, sterile gown, sterile gloves, large sterile sheet, and Chlorhexidine as cutaneous antisepsis. The region was infiltrated locally with 1% lidocaine.  Intravenous Fentanyl and Versed were administered as conscious sedation during continuous cardiorespiratory monitoring by the radiology RN, with a total moderate sedation time of 10 minutes.  Under real-time ultrasound guidance, the right IJ vein was accessed with a 21 gauge micropuncture needle; the needle tip within the vein was confirmed with ultrasound image documentation. Needle exchanged over the 018 guidewire for transitional dilator, which allowed advancement of a Benson wire into the IVC. Over this, an MPA catheter was advanced. A Palindrome 19 hemodialysis catheter was tunneled from the right anterior chest wall approach to the right IJ dermatotomy site. The MPA catheter was exchanged over an Amplatz wire for serial vascular dilators which allow placement of a peel-away sheath, through which the catheter was advanced under intermittent fluoroscopy, positioned with its tips in the proximal and midright atrium. Spot chest radiograph confirms good catheter position. No pneumothorax. Catheter was flushed and primed per protocol. Catheter secured externally with O Prolene suture. The right IJ dermatotomy site was closed with Dermabond.   COMPLICATIONS: COMPLICATIONS None immediate  FLUOROSCOPY TIME:  12 seconds, 3.7 mGy  COMPARISON:  None  IMPRESSION: 1. Technically successful placement of tunneled right IJ hemodialysis catheter with ultrasound and fluoroscopic guidance. Ready for routine use.   Electronically Signed   By: Lucrezia Europe M.D.   On: 07/24/2014 15:16   Ir US Guide Vasc Access Right  07/24/2014   CLINICAL DATA:  Acute renal failure, needs access for hemodialysis  EXAM: TUNNELED HEMODIALYSIS CATHETER PLACEMENT WITH ULTRASOUND AND FLUOROSCOPIC GUIDANCE  TECHNIQUE: The procedure, risks, benefits, and alternatives were explained to the patient. Questions regarding the procedure were encouraged and answered. The patient understands and consents to the procedure. As antibiotic prophylaxis, cefazolin 2 g was ordered pre-procedure and administered intravenously within one hour of incision.Patency of the right IJ vein was confirmed with ultrasound with image documentation. An appropriate skin site was determined. Region was prepped using maximum barrier technique including cap and mask, sterile gown, sterile gloves, large sterile sheet, and Chlorhexidine as cutaneous antisepsis. The region was infiltrated locally with 1% lidocaine.  Intravenous Fentanyl and Versed were administered as conscious sedation during continuous cardiorespiratory monitoring by the radiology RN, with a total moderate sedation time of 10 minutes.  Under real-time ultrasound guidance, the right IJ vein was accessed with a 21 gauge micropuncture needle; the needle tip within the vein was confirmed with ultrasound image documentation. Needle exchanged over the 018 guidewire for transitional dilator, which allowed advancement of a Benson wire into the IVC. Over this, an MPA catheter was advanced. A Palindrome 19 hemodialysis catheter was tunneled from the right anterior chest wall approach to the right IJ dermatotomy site. The MPA catheter was exchanged over an Amplatz wire  for serial vascular dilators which allow placement of a peel-away sheath, through which the catheter was advanced under intermittent fluoroscopy, positioned with its tips in the proximal and midright atrium. Spot chest radiograph confirms good catheter position. No pneumothorax. Catheter was flushed and primed per protocol. Catheter secured externally with  O Prolene suture. The right IJ dermatotomy site was closed with Dermabond.  COMPLICATIONS: COMPLICATIONS None immediate  FLUOROSCOPY TIME:  12 seconds, 3.7 mGy  COMPARISON:  None  IMPRESSION: 1. Technically successful placement of tunneled right IJ hemodialysis catheter with ultrasound and fluoroscopic guidance. Ready for routine use.   Electronically Signed   By: Lucrezia Europe M.D.   On: 07/24/2014 15:16   Dg Chest Port 1 View  08/04/2014   CLINICAL DATA:  Shortness of breath.  EXAM: PORTABLE CHEST - 1 VIEW  COMPARISON:  None.  FINDINGS: Tip of the right dialysis catheter in the distal SVC, projects over overlying oxygen tubing. Heart is at the upper limits of normal in size. Diffuse reticular opacities, may reflect pulmonary edema. Left lobe opacity with left pleural effusion. No pneumothorax.  IMPRESSION: 1. Left lower lobe opacity with left pleural effusion. This may reflect pneumonia with parapneumonic effusion, versus effusion with compressive atelectasis. 2. Diffuse reticular opacities, suspect pulmonary edema. Mild cardiomegaly.   Electronically Signed   By: Jeb Levering M.D.   On: 08/04/2014 05:16   Dg Humerus Right  07/09/2014   CLINICAL DATA:  Injury to the right shoulder and humerus after fall on July 03, 2014 with pain.  EXAM: RIGHT HUMERUS - 2+ VIEW  COMPARISON:  None.  FINDINGS: There is comminuted displaced fracture of the lateral aspect of the proximal right humerus. The visualized right ribs and right lung are normal.  IMPRESSION: Fracture of lateral aspect of proximal right humerus.   Electronically Signed   By: Abelardo Diesel M.D.   On:  07/09/2014 13:58   Dg Hip Unilat With Pelvis 2-3 Views Right  07/09/2014   CLINICAL DATA:  Fall with injury to the right hip 07/03/2014. Pain and bruising. Decreased range of motion.  EXAM: RIGHT HIP (WITH PELVIS) 2-3 VIEWS  COMPARISON:  None.  FINDINGS: Right hip is located. No acute bone or soft tissue abnormality is present. The pelvis is intact.  IMPRESSION: Negative right hip radiographs.   Electronically Signed   By: San Morelle M.D.   On: 07/09/2014 14:13    CBC  Recent Labs Lab 08/01/14 0437 08/01/14 1230 08/02/14 0433 08/03/14 0428 08/04/14 0539  WBC 12.8* 12.5* 10.6* 8.7 13.2*  HGB 9.2* 9.2* 9.2* 8.7* 10.1*  HCT 27.8* 27.2* 27.7* 26.5* 30.2*  PLT 279 257 236 209 226  MCV 92.4 91.3 92.6 90.4 89.9  MCH 30.6 30.9 30.8 29.7 30.1  MCHC 33.1 33.8 33.2 32.8 33.4  RDW 17.3* 16.9* 17.4* 17.2* 17.3*    Chemistries   Recent Labs Lab 08/01/14 0437 08/01/14 1230 08/02/14 0433 08/03/14 0428 08/04/14 0539  NA 122* 122* 126* 121*  121* 120*  K 4.5 4.1 3.8 4.0  4.0 3.7  CL 90* 89* 91* 87*  87* 85*  CO2 18* 22 23 20*  19* 20*  GLUCOSE 79 91 85 90  89 93  BUN 52* 52* 32* 39*  39* 53*  CREATININE 7.77* 8.01* 6.12* 7.14*  7.08* 7.99*  CALCIUM 8.7* 8.6* 8.3* 8.2*  8.2* 8.5*   ------------------------------------------------------------------------------------------------------------------ estimated creatinine clearance is 6.5 mL/min (by C-G formula based on Cr of 7.99). ------------------------------------------------------------------------------------------------------------------ No results for input(s): HGBA1C in the last 72 hours. ------------------------------------------------------------------------------------------------------------------ No results for input(s): CHOL, HDL, LDLCALC, TRIG, CHOLHDL, LDLDIRECT in the last 72 hours. ------------------------------------------------------------------------------------------------------------------ No results  for input(s): TSH, T4TOTAL, T3FREE, THYROIDAB in the last 72 hours.  Invalid input(s): FREET3 ------------------------------------------------------------------------------------------------------------------ No results for input(s): VITAMINB12, FOLATE, FERRITIN, TIBC, IRON, RETICCTPCT in  the last 72 hours.  Coagulation profile No results for input(s): INR, PROTIME in the last 168 hours.  No results for input(s): DDIMER in the last 72 hours.  Cardiac Enzymes No results for input(s): CKMB, TROPONINI, MYOGLOBIN in the last 168 hours.  Invalid input(s): CK ------------------------------------------------------------------------------------------------------------------ Invalid input(s): POCBNP    Indio Santilli D.O. on 08/04/2014 at 11:53 AM  Between 7am to 7pm - Pager - [phone]  After 7pm go to [email or web address] - password [email or web address]  And look for the night coverage person covering for me after hours  [facility] Office  [phone]

## 2014-08-04 NOTE — Progress Notes (Signed)
Subjective:  Erin Morgan is a 59 yo female presenting with ARF with unknown etiology. Patient was seen and examined this morning. She had an episode of shortness of breath this morning. CXR showed left lower lobe opacity with pleural effusion concerning for pneumonia. V/Q scan was normal. Patient continues to have nausea and headaches.   Objective: Filed Vitals:   08/03/14 2214 08/04/14 0329 08/04/14 0517 08/04/14 1114  BP: 169/82 170/81 169/76 182/78  Pulse: 79 84 93 79  Temp:  98 F (36.7 C) 98 F (36.7 C) 98.3 F (36.8 C)  TempSrc:  Oral Oral Oral  Resp: 15 16 18 18   Height:      Weight:      SpO2:  96% 96% 97%   General: Vital signs reviewed. Patient is in no acute distress and cooperative with exam.  Cardiovascular: RRR, S1 normal, S2 normal Pulmonary/Chest: Mild inspiratory crackles in lower lung fields, no wheezes, or rhonchi. Abdominal: Soft, non-tender, non-distended, BS +.   Extremities: 2+ lower extremity pitting edema bilaterally, pulses symmetric and intact bilaterally. Skin: Warm, dry and intact. No rashes or erythema.   Intake/Output from previous day: 07/30 0701 - 07/31 0700 In: 600 [P.O.:600] Out: 300 [Urine:300] Intake/Output this shift:    Lab Results:  Recent Labs  08/03/14 0428 08/04/14 0539  WBC 8.7 13.2*  HGB 8.7* 10.1*  HCT 26.5* 30.2*  PLT 209 226   BMET:   Recent Labs  08/03/14 0428 08/04/14 0539  NA 121*  121* 120*  K 4.0  4.0 3.7  CL 87*  87* 85*  CO2 20*  19* 20*  GLUCOSE 90  89 93  BUN 39*  39* 53*  CREATININE 7.14*  7.08* 7.99*  CALCIUM 8.2*  8.2* 8.5*   I have reviewed the patient's current medications. Prior to Admission:  Prescriptions prior to admission  Medication Sig Dispense Refill Last Dose  . CALCIUM PO Take 1 tablet by mouth daily.   Past Week at Unknown time  . Cholecalciferol 1000 UNITS capsule Take 1,000 Units by mouth daily.   Past Week at Unknown time  . estradiol (VAGIFEM) 25 MCG vaginal tablet  Place 25 mcg vaginally 2 (two) times a week.    Past Week at Unknown time  . folic acid (FOLVITE) 1 MG tablet Take 1 mg by mouth daily.   07/16/2014 at Unknown time  . hydrochlorothiazide (MICROZIDE) 12.5 MG capsule Take 12.5 mg by mouth as needed (for fluid).    07/16/2014 at Unknown time  . predniSONE (DELTASONE) 5 MG tablet Take 20 mg by mouth daily.   07/16/2014 at Unknown time  . HYDROcodone-acetaminophen (NORCO) 5-325 MG per tablet Take 1 tablet by mouth every 6 (six) hours as needed for moderate pain. 30 tablet 0    Scheduled: . amLODipine  10 mg Oral QHS  . bisacodyl  10 mg Rectal BID  . calcium acetate  667 mg Oral TID WC  . capsicum oleoresin   Topical BID  . cloNIDine  0.1 mg Oral TID  . darbepoetin (ARANESP) injection - DIALYSIS  200 mcg Intravenous Q Thu-HD  . feeding supplement (NEPRO CARB STEADY)  237 mL Oral BID BM  . hydrALAZINE  50 mg Oral 3 times per day  . [START ON 08/06/2014] levofloxacin  500 mg Oral Q48H  . multivitamin  1 tablet Oral QHS  . neomycin-polymyxin b-dexamethasone  2 drop Right Eye Q12H  . pantoprazole  20 mg Oral Daily  . predniSONE  20 mg Oral QAC  breakfast  . senna-docusate  1 tablet Oral BID  . sodium chloride  3 mL Intravenous Q12H   Continuous: . sodium chloride 10 mL/hr at 07/18/14 1400  . sodium chloride     [id number], butalbital-acetaminophen-caffeine, hydrALAZINE, HYDROcodone-acetaminophen, morphine injection, ondansetron **OR** ondansetron (ZOFRAN) IV, oxyCODONE-acetaminophen, prochlorperazine, technetium TC 71M diethylenetriame-pentaacetic acid  Assessment/Plan:   ARF: Likely ESRD. Presenting with ARF of unknown etiology. Renal biopsy results showed thrombotic microangiopathy. Further work up has been unremarkable making the most likely etiology hypertension. DDx also includes scleroderma; although her rheumatologist did not think that was her diagnosis per the patient and her renal biopsy was not indicative of scleroderma. Creatinine  has trended back up from 7.14 to 7.99 today. UOP -300 yesterday. Given hypervolemia, nausea, and headaches, patient will go for HD today.  -HD today and possibly tomorrow -Renal function panel daily -Continue to monitor UOP -Consider US/MRI as outpatient for posterior kidney lesion -Vein mapping -Will likely need to start CLIP process this week and obtain AVF/AVG  ?HCAP: Afebrile, mild leukocytosis. CXR showed left lower lobe opacity with pleural effusion concerning for pneumonia. Patient was started on Levaquin. -Per primary  Hypertension: 160s/70s this morning. Will improve with HD. -Continue amlodipine 10 mg daily -Continue clonodine 0.1 mg TID -Continue Hydralazine 50 mg TID    Non-gapped Metabolic Acidosis: Bicarb 20 this morning. Likely secondary to renal failure. Will improve with HD. -HD today  Hyperphosphatemia: Phosphorous 4.1 this morning.  -PhosLo 667 mg TID WC  Iron Deficiency Anemia: Hemoglobin stable around 8-10.  -Monitor CBC intermittently  Hypervolemic Hyponatremia: Sodium this morning 120. Likely secondary to renal failure. We will improve slowly with HD -Fluid restrict -HD today  RA: Discontinued methotrexate.  -Continue prednisone 20 mg daily   LOS: 18 days   Osa Craver, DO PGY-2 Internal Medicine Resident Pager # [phone] 08/04/2014 11:30 AM

## 2014-08-05 ENCOUNTER — Inpatient Hospital Stay (HOSPITAL_COMMUNITY): Payer: BC Managed Care – PPO

## 2014-08-05 ENCOUNTER — Encounter (HOSPITAL_COMMUNITY): Payer: BC Managed Care – PPO

## 2014-08-05 LAB — RENAL FUNCTION PANEL
ANION GAP: 15 (ref 5–15)
Albumin: 3.4 g/dL — ABNORMAL LOW (ref 3.5–5.0)
BUN: 48 mg/dL — ABNORMAL HIGH (ref 6–20)
CO2: 20 mmol/L — ABNORMAL LOW (ref 22–32)
Calcium: 8.4 mg/dL — ABNORMAL LOW (ref 8.9–10.3)
Chloride: 88 mmol/L — ABNORMAL LOW (ref 101–111)
Creatinine, Ser: 7.76 mg/dL — ABNORMAL HIGH (ref 0.44–1.00)
GFR calc Af Amer: 6 mL/min — ABNORMAL LOW (ref >60)
GFR calc non Af Amer: 5 mL/min — ABNORMAL LOW (ref >60)
Glucose, Bld: 98 mg/dL (ref 65–99)
Phosphorus: 4.1 mg/dL (ref 2.5–4.6)
Potassium: 4.1 mmol/L (ref 3.5–5.1)
SODIUM: 123 mmol/L — AB (ref 135–145)

## 2014-08-05 LAB — CBC
HCT: 31.7 % — ABNORMAL LOW (ref 36.0–46.0)
HEMOGLOBIN: 10.6 g/dL — AB (ref 12.0–15.0)
MCH: 30 pg (ref 26.0–34.0)
MCHC: 33.4 g/dL (ref 30.0–36.0)
MCV: 89.8 fL (ref 78.0–100.0)
PLATELETS: 220 10*3/uL (ref 150–400)
RBC: 3.53 MIL/uL — ABNORMAL LOW (ref 3.87–5.11)
RDW: 17.7 % — ABNORMAL HIGH (ref 11.5–15.5)
WBC: 15.6 10*3/uL — AB (ref 4.0–10.5)

## 2014-08-05 MED ORDER — HEPARIN SODIUM (PORCINE) 1000 UNIT/ML DIALYSIS: 20.0000 [IU]/kg | INTRAMUSCULAR | Status: DC | PRN

## 2014-08-05 MED ORDER — NON FORMULARY: 900.0000 mg | Status: DC

## 2014-08-05 MED ORDER — MENINGOCOCCAL VAC A,C,Y,W-135 ~~LOC~~ INJ
0.5000 mL | INJECTION | Freq: Once | SUBCUTANEOUS | Status: DC
  Filled 2014-08-05: qty 0.5

## 2014-08-05 MED ORDER — PROMETHAZINE HCL 25 MG/ML IJ SOLN
12.5000 mg | Freq: Four times a day (QID) | INTRAMUSCULAR | Status: DC | PRN
  Administered 2014-08-05: 12.5 mg via INTRAVENOUS
  Filled 2014-08-05: qty 1

## 2014-08-05 MED ORDER — MENINGOCOCCAL A C Y&W-135 OLIG IM SOLR
0.5000 mL | Freq: Once | INTRAMUSCULAR | Status: AC
  Administered 2014-08-05: 0.5 mL via INTRAMUSCULAR
  Filled 2014-08-05: qty 0.5

## 2014-08-05 NOTE — Progress Notes (Signed)
Subjective:  Nauseated this AM- not wanting to engage- feels poorly.  Were able to remove 3 liters yesterday but has so much more to go.   Objective Vital signs in last 24 hours: Filed Vitals:   08/04/14 2114 08/04/14 2250 08/05/14 0028 08/05/14 1000  BP: 187/89 174/89 173/70 189/84  Pulse: 92 87 94 92  Temp: 98.4 F (36.9 C)   97.9 F (36.6 C)  TempSrc: Oral   Oral  Resp: 18   18  Height: 5\' 4"  (1.626 m)     Weight:      SpO2: 98%   98%   Weight change:   Intake/Output Summary (Last 24 hours) at 08/05/14 1237 Last data filed at 08/05/14 0900  Gross per 24 hour  Intake      0 ml  Output   3200 ml  Net  -3200 ml    Assessment/ Plan: Pt is a 59 y.o. yo female who was admitted on 07/17/2014 with  AKI and malignant HTN after being diagnosed with possible RA-  Assessment/Plan: 1. AKI- was told renal function was normal in June- was 2.3 in July after being started on methotrexate but then held.  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  In looking at things- her ADAMts activity was 54- her complements were low normal and she seemed to have evidence of hemolysis with low haptoglobin and elevated LDH- I cannot see the review for the smear and platelets are normal.  Could this be atypical HUS ??? I have call into expert to assist.  In any event- she needs more aggressive dialysis so will arrange for today and tomorrow with aggressive volume removal.  Also I agree with making plans for the future to get vein mapping and access as likely is ESRD UNLESS someone feels like this could be consistent with atypical HUS which has a treatment available- Soliris.  i hope I will get some guidance today regarding this possible treatment.  2. Volume- needs more aggressive volume removal  3. Anemia- trending up - on ESA    Joziah Dollins A    Labs: Basic Metabolic Panel:  Recent Labs Lab 08/03/14 0428 08/04/14 0539 08/05/14 0912  NA 121*  121* 120* 123*  K 4.0  4.0 3.7 4.1   CL 87*  87* 85* 88*  CO2 20*  19* 20* 20*  GLUCOSE 90  89 93 98  BUN 39*  39* 53* 48*  CREATININE 7.14*  7.08* 7.99* 7.76*  CALCIUM 8.2*  8.2* 8.5* 8.4*  PHOS 4.6 4.1 4.1   Liver Function Tests:  Recent Labs Lab 08/03/14 0428 08/04/14 0539 08/05/14 0912  ALBUMIN 3.2* 3.6 3.4*   No results for input(s): LIPASE, AMYLASE in the last 168 hours. No results for input(s): AMMONIA in the last 168 hours. CBC:  Recent Labs Lab 08/01/14 1230 08/02/14 0433 08/03/14 0428 08/04/14 0539 08/05/14 0912  WBC 12.5* 10.6* 8.7 13.2* 15.6*  HGB 9.2* 9.2* 8.7* 10.1* 10.6*  HCT 27.2* 27.7* 26.5* 30.2* 31.7*  MCV 91.3 92.6 90.4 89.9 89.8  PLT 257 236 209 226 220   Cardiac Enzymes: No results for input(s): CKTOTAL, CKMB, CKMBINDEX, TROPONINI in the last 168 hours. CBG: No results for input(s): GLUCAP in the last 168 hours.  Iron Studies: No results for input(s): IRON, TIBC, TRANSFERRIN, FERRITIN in the last 72 hours. Studies/Results: Nm Pulmonary Perf And Vent  08/04/2014   CLINICAL DATA:  Acute shortness of breath.  EXAM: NUCLEAR MEDICINE VENTILATION - PERFUSION LUNG SCAN  TECHNIQUE: Ventilation images were obtained in multiple projections using inhaled aerosol Tc-11m DTPA. Perfusion images were obtained in multiple projections after intravenous injection of Tc-98m MAA.  RADIOPHARMACEUTICALS:  38.5 Technetium-56m DTPA aerosol inhalation and 5.9 Technetium-62m MAA IV  COMPARISON:  08/04/2014 chest radiograph  FINDINGS: Ventilation: No focal ventilation defect.  Perfusion: No wedge shaped peripheral perfusion defects to suggest acute pulmonary embolism.  IMPRESSION: Normal exam.   Electronically Signed   By: Margarette Canada M.D.   On: 08/04/2014 07:05   Dg Chest Port 1 View  08/04/2014   CLINICAL DATA:  Shortness of breath.  EXAM: PORTABLE CHEST - 1 VIEW  COMPARISON:  None.  FINDINGS: Tip of the right dialysis catheter in the distal SVC, projects over overlying oxygen tubing. Heart is at the  upper limits of normal in size. Diffuse reticular opacities, may reflect pulmonary edema. Left lobe opacity with left pleural effusion. No pneumothorax.  IMPRESSION: 1. Left lower lobe opacity with left pleural effusion. This may reflect pneumonia with parapneumonic effusion, versus effusion with compressive atelectasis. 2. Diffuse reticular opacities, suspect pulmonary edema. Mild cardiomegaly.   Electronically Signed   By: Jeb Levering M.D.   On: 08/04/2014 05:16   Dg Shoulder Right Port  08/05/2014   CLINICAL DATA:  Pain.  EXAM: PORTABLE RIGHT SHOULDER - 2+ VIEW  COMPARISON:  07/09/2014  FINDINGS: Again noted is fracture of the right humerus, involving the greater tuberosity. No dislocation or new fracture identified. Right lung apex is clear. Right-sided dialysis catheter is present. There is degenerative change in the acromioclavicular joint.  IMPRESSION: Stable appearance of fracture involving the right greater tuberosity.   Electronically Signed   By: Nolon Nations M.D.   On: 08/05/2014 12:26   Medications: Infusions: . sodium chloride 10 mL/hr at 07/18/14 1400  . sodium chloride      Scheduled Medications: . amLODipine  10 mg Oral QHS  . bisacodyl  10 mg Rectal BID  . calcium acetate  667 mg Oral TID WC  . capsicum oleoresin   Topical BID  . cloNIDine  0.1 mg Oral TID  . darbepoetin (ARANESP) injection - DIALYSIS  200 mcg Intravenous Q Thu-HD  . feeding supplement (NEPRO CARB STEADY)  237 mL Oral BID BM  . hydrALAZINE  50 mg Oral 3 times per day  . [START ON 08/06/2014] levofloxacin  500 mg Oral Q48H  . multivitamin  1 tablet Oral QHS  . neomycin-polymyxin b-dexamethasone  2 drop Right Eye Q12H  . pantoprazole  20 mg Oral Daily  . predniSONE  20 mg Oral QAC breakfast  . senna-docusate  1 tablet Oral BID  . sodium chloride  3 mL Intravenous Q12H    have reviewed scheduled and prn medications.  Physical Exam: General: sitting up- nauseated- "i want to feel better" Heart:  RRR Lungs: dec BS at bases Abdomen: distended Extremities: pitting edema Dialysis Access: PC placed 7/20   08/05/2014,12:37 PM  LOS: 19 days

## 2014-08-05 NOTE — Procedures (Signed)
Patient was seen on dialysis and the procedure was supervised.  BFR 250  Via PC BP is  High- needs volume off.   Patient appears to be tolerating treatment well  Erin Morgan A 08/05/2014

## 2014-08-05 NOTE — Progress Notes (Signed)
Spoke with a thought leader regarding atypical HUS- he felt her case could be consistent with atypical HUS and that we should start therapy with Soliris (eclulizumab)  Spoke with patient who is agreeable and have enlisted the help with the pharmaceutical rep with [employer]- hopefully will be able to start therapy in the next few days- dose is 900 mg weekly for 4 weeks to start   [place]

## 2014-08-05 NOTE — Progress Notes (Signed)
Pt nauseated zofran and comp ineffective, pt requests phenergan supp, paged Dr. Thornell Mule.

## 2014-08-05 NOTE — Progress Notes (Signed)
[facility]                                                                              Patient Demographics  Erin Morgan, is a 59 y.o. female, DOB - 10/03/1955, MH:[id number]  Admit date - 07/17/2014   Admitting Physician Toy Baker, MD  Outpatient Primary MD for the patient is Nicola Girt, DO  LOS - 20   Chief Complaint  Patient presents with  . Headache  . Nausea  . Hypertension      HPI on 07/17/2014 by Dr. Toy Baker Presented with 3-4 months edema and generalized joint pain.  Patient was diagnosed with RA since MAY she was prednisone at first and then was started on methotrexate. But her blood work was abnormal with rising Cr and anemia. He methotrexate was stopped with last dose being almost 2 weeks ago. She have had a nausea for few weeks and continued to have significant edema. Her rheumatologist started her on HCTZ to help with swelling. She have had a headache off and on for few weeks but gotten persistently worse. Now it became constant. She had to wake up in the night to take additional tylenol. Presented to PCP and had blood work today showing WBC of 15. She was instructed to come to ER. CT of the head was unremarkable. CR was noted to be up to 3.78, HG 9.4 WBC on repeat was down to 10.4 Denies any fever or chills. 2 weeks ago she traveled to [place] where she had fallen and broke her right arm. She was seen by orthopedics and is wearing a splint.  She reports decreased PO intake today, have had decreased urine output last urination was few hours ago and reports no change in color. 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   Assessment & Plan   Acute progressive renal failure/?ESRD - renal biopsy with thrombotic microangiopathy, ? TTP (ADAMTS13 deficiency), HUS, immune mediated or toxin mediated - ? malignant HTN - Nephrology consulted and appreciated -  CT abd/pelvis w/o contrast: Cecal inflammation, indeterminate lesion extending posterior to the left kidney   - Creatinine 7.76 - HD attempted 7/31- however, catheter was nonfunctinoal after cathflo given - Patient to dialyze today - Will obtain upper ext vein mapping and vascular consult  HCAP with leukocytosis - Overnight, patient became dyspneic, without tachycardia or tachypnea - VQ scan ordered and showed no pulmonary embolism, was normal - Chest x-ray: Left lower lobe opacity with left pleural effusion - Continue levaquin - was placed on supplemental O2 overnight, however is maintaining her sats at 97% on Room air  Nausea and vomiting - continue antiemetics PRN - continue to provide senna and add bisacodyl supp   Rheumatoid arthritis  - Continue prednisone 20 mg daily  - holding MTX  - appears reasonably stable at present  - no focal joint complaints  Acute encephalopathy - resolved   Normocytic anemia - Iron studies suggest iron deficiency  - dosed with Feraheme 07/20/14 - Hg remains overall stable with no signs of active bleeding, currently 10.6  Hypervolemic Hyponatremia - Due to acute renal failure - Na 123 -  Continue to monitor BMP  Hypokalemia - WNL this AM - daily renal panel   Uncontrolled hypertension/ urgency - currently on Norvasc, Hydralazine, Clonidine  - Patient had been on hydralazine prior to admission > anti-histone antibody negative - Echocardiogram notes no impairment in systolic fxn or WMA - Suspect BP will improve with dialysis today  Persistent waxing/waning HA  - Continue pain control as needed - orthostatic vitals negative   Inadequate oral intake in the setting of acute illness  - nutritionist consulted, recommendations appreciated   Right shoulder pain/fracture - Improved, continue capsicum cream and heating pad - will order repeat xray as patient had a fracture ~4-6 weeks ago  Code Status: Full  Family Communication:  Parents at bedside  Disposition Plan: Admitted.  Continue to monitor Cr.  Time Spent in minutes   30 minutes  Procedures  Renal Bx VQ scan  Consults   Nephrology  DVT Prophylaxis  SCDs  Lab Results  Component Value Date   PLT 220 08/05/2014    Medications  Scheduled Meds: . amLODipine  10 mg Oral QHS  . bisacodyl  10 mg Rectal BID  . calcium acetate  667 mg Oral TID WC  . capsicum oleoresin   Topical BID  . cloNIDine  0.1 mg Oral TID  . darbepoetin (ARANESP) injection - DIALYSIS  200 mcg Intravenous Q Thu-HD  . feeding supplement (NEPRO CARB STEADY)  237 mL Oral BID BM  . hydrALAZINE  50 mg Oral 3 times per day  . [START ON 08/06/2014] levofloxacin  500 mg Oral Q48H  . multivitamin  1 tablet Oral QHS  . neomycin-polymyxin b-dexamethasone  2 drop Right Eye Q12H  . pantoprazole  20 mg Oral Daily  . predniSONE  20 mg Oral QAC breakfast  . senna-docusate  1 tablet Oral BID  . sodium chloride  3 mL Intravenous Q12H   Continuous Infusions: . sodium chloride 10 mL/hr at 07/18/14 1400  . sodium chloride     PRN Meds:.acetaminophen, butalbital-acetaminophen-caffeine, hydrALAZINE, HYDROcodone-acetaminophen, morphine injection, ondansetron **OR** ondansetron (ZOFRAN) IV, oxyCODONE-acetaminophen, prochlorperazine, promethazine  Antibiotics    Anti-infectives    Start     Dose/Rate Route Frequency Ordered Stop   08/06/14 1000  levofloxacin (LEVAQUIN) tablet 500 mg     500 mg Oral Every 48 hours 08/04/14 1100     08/04/14 1130  levofloxacin (LEVAQUIN) tablet 750 mg     750 mg Oral  Once 08/04/14 1100 08/04/14 1108   07/24/14 1000  ceFAZolin (ANCEF) IVPB 2 g/50 mL premix    Comments:  Hang ON CALL to xray 7/20   2 g 100 mL/hr over 30 Minutes Intravenous On call 07/24/14 0908 07/24/14 1503      Subjective:   Erin Morgan seen and examined today.  Patient continues to complain of nausea. Feels the zofran is no longer helping.  Feels headaches and shoulder pain are  better.  Denies chest pain, shortness of breath, or abdominal pain.  Objective:   Filed Vitals:   08/04/14 2114 08/04/14 2250 08/05/14 0028 08/05/14 1000  BP: 187/89 174/89 173/70 189/84  Pulse: 92 87 94 92  Temp: 98.4 F (36.9 C)   97.9 F (36.6 C)  TempSrc: Oral   Oral  Resp: 18   18  Height: 5\' 4"  (1.626 m)     Weight:      SpO2: 98%   98%    Wt Readings from Last 3 Encounters:  08/04/14 62.7 kg (138 lb 3.7 oz)  04/05/14  62.143 kg (137 lb)  05/21/12 59.875 kg (132 lb)     Intake/Output Summary (Last 24 hours) at 08/05/14 1120 Last data filed at 08/05/14 0900  Gross per 24 hour  Intake    240 ml  Output   3400 ml  Net  -3160 ml    Exam  General: Well developed, well nourished, no distress  HEENT: NCAT, mucous membranes moist.   Cardiovascular: S1 S2 auscultated, RRR, no murmurs  Respiratory: Clear to auscultation, no wheezing   Abdomen: Soft, nontender, nondistended, + bowel sounds  Extremities: warm dry without cyanosis clubbing. Trace edema  Data Review   Micro Results No results found for this or any previous visit (from the past 240 hour(s)).  Radiology Reports Ct Abdomen Pelvis Wo Contrast  08/02/2014   CLINICAL DATA:  Acute renal failure.  Headache and nausea  EXAM: CT ABDOMEN AND PELVIS WITHOUT CONTRAST  TECHNIQUE: Multidetector CT imaging of the abdomen and pelvis was performed following the standard protocol without IV contrast.  COMPARISON:  None.  FINDINGS: Lower chest: Bilateral pleural effusions and basilar atelectasis.  Hepatobiliary: No focal hepatic lesion. No biliary duct dilatation. Gallbladder is normal. Common bile duct is normal.  Pancreas: Pancreas is normal. No ductal dilatation. No pancreatic inflammation.  Spleen: Scattered granulomata within the spleen liver.  Adrenals/urinary tract: Adrenal glands are normal. No hydronephrosis. There is a intermediate density lesion extending from the posterior mid aspect of the LEFT kidney measuring  16 mm on image 28, series 2.  Stomach/Bowel: Stomach, small bowel are normal. Normal appendix is seen on image 30, series 5. There is inflammation within the fat surrounding the cecum and to lesser degree the ascending colon. There is a serosal inflammation along the cecum. The more distal. Transverse and descending colon are normal. The rectum is normal.  Vascular/Lymphatic: Abdominal aorta is normal caliber. There is no retroperitoneal or periportal lymphadenopathy. No pelvic lymphadenopathy.  Reproductive: Uterus and ovaries are normal.  Musculoskeletal: No aggressive osseous lesion.  Other: No free fluid.  IMPRESSION: 1. Inflammation primary involving the cecum. Differential would include ischemic colitis, infectious colitis and inflammatory bowel disease. 2. Indeterminate lesion extending posterior to the LEFT kidney. If the patient cannot receive IV contrast recommend ultrasound and potentially MRI for further evaluation.   Electronically Signed   By: Suzy Bouchard M.D.   On: 08/02/2014 18:48   Dg Shoulder Right  07/09/2014   CLINICAL DATA:  Right shoulder injury.  Fall.  EXAM: RIGHT SHOULDER - 2+ VIEW  COMPARISON:  None.  FINDINGS: Fracture of the greater tuberosity of the proximal right humerus is present. No other abnormality identified. No evidence of dislocation or separation.  IMPRESSION: Minimally displaced fracture of the greater tuberosity of the proximal right humerus.   Electronically Signed   By: Marcello Moores  Register   On: 07/09/2014 13:58   Ct Head Wo Contrast  07/17/2014   CLINICAL DATA:  59 year old female with headache, nausea and leukocytosis  EXAM: CT HEAD WITHOUT CONTRAST  TECHNIQUE: Contiguous axial images were obtained from the base of the skull through the vertex without intravenous contrast.  COMPARISON:  None.  FINDINGS: Negative for acute intracranial hemorrhage, acute infarction, mass, mass effect, hydrocephalus or midline shift. Gray-white differentiation is preserved throughout.  No acute soft tissue or calvarial abnormality. The globes and orbits are symmetric and unremarkable. Normal aeration of the mastoid air cells and visualized paranasal sinuses. Small sclerotic focus in the right frontal calvarium just above the orbit likely represents a benign bone  island.  IMPRESSION: Negative head CT.   Electronically Signed   By: Jacqulynn Cadet M.D.   On: 07/17/2014 17:04   Mr Brain Wo Contrast  07/18/2014   CLINICAL DATA:  Initial evaluation for acute headache.  EXAM: MRI HEAD WITHOUT CONTRAST  TECHNIQUE: Multiplanar, multiecho pulse sequences of the brain and surrounding structures were obtained without intravenous contrast.  COMPARISON:  Prior CT from 07/17/2014  FINDINGS: The CSF containing spaces are within normal limits for patient age. Few scattered T2/FLAIR hyperintense foci present within the periventricular and deep white matter of both cerebral hemispheres, nonspecific. No mass lesion, midline shift, or extra-axial fluid collection. Ventricles are normal in size without evidence of hydrocephalus.  No diffusion-weighted signal abnormality is identified to suggest acute intracranial infarct. Gray-white matter differentiation is maintained. Normal flow voids are seen within the intracranial vasculature. No intracranial hemorrhage identified.  The cervicomedullary junction is normal. Pituitary gland is within normal limits. Pituitary stalk is midline. The globes and optic nerves demonstrate a normal appearance with normal signal intensity. The  The bone marrow signal intensity is normal. Calvarium is intact. Visualized upper cervical spine is within normal limits.  Scalp soft tissues are unremarkable.  Paranasal sinuses are clear.  No mastoid effusion.  IMPRESSION: 1. No acute intracranial process identified. 2. Few scattered subcentimeter T2/FLAIR height matter foci within the supratentorial white matter. These foci are nonspecific, but may be related to very mild chronic small  vessel ischemic changes. These are felt to be within normal limits for patient age. 3. Otherwise normal brain MRI   Electronically Signed   By: Jeannine Boga M.D.   On: 07/18/2014 06:53   US Renal  07/18/2014   CLINICAL DATA:  Acute renal failure.  EXAM: RENAL / URINARY TRACT ULTRASOUND COMPLETE  COMPARISON:  None.  FINDINGS: Right Kidney:  Length: 10.0 cm. Echogenicity within normal limits. No mass or hydronephrosis visualized.  Left Kidney:  Length: 9.4 cm. Echogenicity within normal limits. No mass or hydronephrosis visualized.  Bladder:  Appears normal for degree of bladder distention.  Small bilateral pleural effusions incidentally noted.  IMPRESSION: No evidence of hydronephrosis or other renal abnormality.  Small bilateral pleural effusions incidentally noted.   Electronically Signed   By: Earle Gell M.D.   On: 07/18/2014 11:55   Nm Pulmonary Perf And Vent  08/04/2014   CLINICAL DATA:  Acute shortness of breath.  EXAM: NUCLEAR MEDICINE VENTILATION - PERFUSION LUNG SCAN  TECHNIQUE: Ventilation images were obtained in multiple projections using inhaled aerosol Tc-30m DTPA. Perfusion images were obtained in multiple projections after intravenous injection of Tc-71m MAA.  RADIOPHARMACEUTICALS:  38.5 Technetium-55m DTPA aerosol inhalation and 5.9 Technetium-28m MAA IV  COMPARISON:  08/04/2014 chest radiograph  FINDINGS: Ventilation: No focal ventilation defect.  Perfusion: No wedge shaped peripheral perfusion defects to suggest acute pulmonary embolism.  IMPRESSION: Normal exam.   Electronically Signed   By: Margarette Canada M.D.   On: 08/04/2014 07:05   US Biopsy  07/23/2014   CLINICAL DATA:  Renal insufficiency  EXAM: ULTRASOUND BIOPSY  COMPARISON:  None.  FINDINGS: Imaging was provided to the Nephrology service for renal biopsy.  IMPRESSION: See above.   Electronically Signed   By: Marybelle Killings M.D.   On: 07/23/2014 13:26   Ir Fluoro Guide Cv Line Right  07/24/2014   CLINICAL DATA:  Acute renal  failure, needs access for hemodialysis  EXAM: TUNNELED HEMODIALYSIS CATHETER PLACEMENT WITH ULTRASOUND AND FLUOROSCOPIC GUIDANCE  TECHNIQUE: The procedure, risks, benefits, and alternatives  were explained to the patient. Questions regarding the procedure were encouraged and answered. The patient understands and consents to the procedure. As antibiotic prophylaxis, cefazolin 2 g was ordered pre-procedure and administered intravenously within one hour of incision.Patency of the right IJ vein was confirmed with ultrasound with image documentation. An appropriate skin site was determined. Region was prepped using maximum barrier technique including cap and mask, sterile gown, sterile gloves, large sterile sheet, and Chlorhexidine as cutaneous antisepsis. The region was infiltrated locally with 1% lidocaine.  Intravenous Fentanyl and Versed were administered as conscious sedation during continuous cardiorespiratory monitoring by the radiology RN, with a total moderate sedation time of 10 minutes.  Under real-time ultrasound guidance, the right IJ vein was accessed with a 21 gauge micropuncture needle; the needle tip within the vein was confirmed with ultrasound image documentation. Needle exchanged over the 018 guidewire for transitional dilator, which allowed advancement of a Benson wire into the IVC. Over this, an MPA catheter was advanced. A Palindrome 19 hemodialysis catheter was tunneled from the right anterior chest wall approach to the right IJ dermatotomy site. The MPA catheter was exchanged over an Amplatz wire for serial vascular dilators which allow placement of a peel-away sheath, through which the catheter was advanced under intermittent fluoroscopy, positioned with its tips in the proximal and midright atrium. Spot chest radiograph confirms good catheter position. No pneumothorax. Catheter was flushed and primed per protocol. Catheter secured externally with O Prolene suture. The right IJ dermatotomy site  was closed with Dermabond.  COMPLICATIONS: COMPLICATIONS None immediate  FLUOROSCOPY TIME:  12 seconds, 3.7 mGy  COMPARISON:  None  IMPRESSION: 1. Technically successful placement of tunneled right IJ hemodialysis catheter with ultrasound and fluoroscopic guidance. Ready for routine use.   Electronically Signed   By: Lucrezia Europe M.D.   On: 07/24/2014 15:16   Ir US Guide Vasc Access Right  07/24/2014   CLINICAL DATA:  Acute renal failure, needs access for hemodialysis  EXAM: TUNNELED HEMODIALYSIS CATHETER PLACEMENT WITH ULTRASOUND AND FLUOROSCOPIC GUIDANCE  TECHNIQUE: The procedure, risks, benefits, and alternatives were explained to the patient. Questions regarding the procedure were encouraged and answered. The patient understands and consents to the procedure. As antibiotic prophylaxis, cefazolin 2 g was ordered pre-procedure and administered intravenously within one hour of incision.Patency of the right IJ vein was confirmed with ultrasound with image documentation. An appropriate skin site was determined. Region was prepped using maximum barrier technique including cap and mask, sterile gown, sterile gloves, large sterile sheet, and Chlorhexidine as cutaneous antisepsis. The region was infiltrated locally with 1% lidocaine.  Intravenous Fentanyl and Versed were administered as conscious sedation during continuous cardiorespiratory monitoring by the radiology RN, with a total moderate sedation time of 10 minutes.  Under real-time ultrasound guidance, the right IJ vein was accessed with a 21 gauge micropuncture needle; the needle tip within the vein was confirmed with ultrasound image documentation. Needle exchanged over the 018 guidewire for transitional dilator, which allowed advancement of a Benson wire into the IVC. Over this, an MPA catheter was advanced. A Palindrome 19 hemodialysis catheter was tunneled from the right anterior chest wall approach to the right IJ dermatotomy site. The MPA catheter was  exchanged over an Amplatz wire for serial vascular dilators which allow placement of a peel-away sheath, through which the catheter was advanced under intermittent fluoroscopy, positioned with its tips in the proximal and midright atrium. Spot chest radiograph confirms good catheter position. No pneumothorax. Catheter was flushed and primed per  protocol. Catheter secured externally with O Prolene suture. The right IJ dermatotomy site was closed with Dermabond.  COMPLICATIONS: COMPLICATIONS None immediate  FLUOROSCOPY TIME:  12 seconds, 3.7 mGy  COMPARISON:  None  IMPRESSION: 1. Technically successful placement of tunneled right IJ hemodialysis catheter with ultrasound and fluoroscopic guidance. Ready for routine use.   Electronically Signed   By: Lucrezia Europe M.D.   On: 07/24/2014 15:16   Dg Chest Port 1 View  08/04/2014   CLINICAL DATA:  Shortness of breath.  EXAM: PORTABLE CHEST - 1 VIEW  COMPARISON:  None.  FINDINGS: Tip of the right dialysis catheter in the distal SVC, projects over overlying oxygen tubing. Heart is at the upper limits of normal in size. Diffuse reticular opacities, may reflect pulmonary edema. Left lobe opacity with left pleural effusion. No pneumothorax.  IMPRESSION: 1. Left lower lobe opacity with left pleural effusion. This may reflect pneumonia with parapneumonic effusion, versus effusion with compressive atelectasis. 2. Diffuse reticular opacities, suspect pulmonary edema. Mild cardiomegaly.   Electronically Signed   By: Jeb Levering M.D.   On: 08/04/2014 05:16   Dg Humerus Right  07/09/2014   CLINICAL DATA:  Injury to the right shoulder and humerus after fall on July 03, 2014 with pain.  EXAM: RIGHT HUMERUS - 2+ VIEW  COMPARISON:  None.  FINDINGS: There is comminuted displaced fracture of the lateral aspect of the proximal right humerus. The visualized right ribs and right lung are normal.  IMPRESSION: Fracture of lateral aspect of proximal right humerus.   Electronically Signed    By: Abelardo Diesel M.D.   On: 07/09/2014 13:58   Dg Hip Unilat With Pelvis 2-3 Views Right  07/09/2014   CLINICAL DATA:  Fall with injury to the right hip 07/03/2014. Pain and bruising. Decreased range of motion.  EXAM: RIGHT HIP (WITH PELVIS) 2-3 VIEWS  COMPARISON:  None.  FINDINGS: Right hip is located. No acute bone or soft tissue abnormality is present. The pelvis is intact.  IMPRESSION: Negative right hip radiographs.   Electronically Signed   By: San Morelle M.D.   On: 07/09/2014 14:13    CBC  Recent Labs Lab 08/01/14 1230 08/02/14 0433 08/03/14 0428 08/04/14 0539 08/05/14 0912  WBC 12.5* 10.6* 8.7 13.2* 15.6*  HGB 9.2* 9.2* 8.7* 10.1* 10.6*  HCT 27.2* 27.7* 26.5* 30.2* 31.7*  PLT 257 236 209 226 220  MCV 91.3 92.6 90.4 89.9 89.8  MCH 30.9 30.8 29.7 30.1 30.0  MCHC 33.8 33.2 32.8 33.4 33.4  RDW 16.9* 17.4* 17.2* 17.3* 17.7*    Chemistries   Recent Labs Lab 08/01/14 1230 08/02/14 0433 08/03/14 0428 08/04/14 0539 08/05/14 0912  NA 122* 126* 121*  121* 120* 123*  K 4.1 3.8 4.0  4.0 3.7 4.1  CL 89* 91* 87*  87* 85* 88*  CO2 22 23 20*  19* 20* 20*  GLUCOSE 91 85 90  89 93 98  BUN 52* 32* 39*  39* 53* 48*  CREATININE 8.01* 6.12* 7.14*  7.08* 7.99* 7.76*  CALCIUM 8.6* 8.3* 8.2*  8.2* 8.5* 8.4*   ------------------------------------------------------------------------------------------------------------------ estimated creatinine clearance is 6.7 mL/min (by C-G formula based on Cr of 7.76). ------------------------------------------------------------------------------------------------------------------ No results for input(s): HGBA1C in the last 72 hours. ------------------------------------------------------------------------------------------------------------------ No results for input(s): CHOL, HDL, LDLCALC, TRIG, CHOLHDL, LDLDIRECT in the last 72  hours. ------------------------------------------------------------------------------------------------------------------ No results for input(s): TSH, T4TOTAL, T3FREE, THYROIDAB in the last 72 hours.  Invalid input(s): FREET3 ------------------------------------------------------------------------------------------------------------------ No results for input(s): VITAMINB12, FOLATE,  FERRITIN, TIBC, IRON, RETICCTPCT in the last 72 hours.  Coagulation profile No results for input(s): INR, PROTIME in the last 168 hours.  No results for input(s): DDIMER in the last 72 hours.  Cardiac Enzymes No results for input(s): CKMB, TROPONINI, MYOGLOBIN in the last 168 hours.  Invalid input(s): CK ------------------------------------------------------------------------------------------------------------------ Invalid input(s): POCBNP    Mora Pedraza D.O. on 08/05/2014 at 11:20 AM  Between 7am to 7pm - Pager - [phone]  After 7pm go to [email or web address] - password [email or web address]  And look for the night coverage person covering for me after hours  [facility] Office  [phone]

## 2014-08-05 NOTE — Progress Notes (Signed)
Pt bp re-check after 10 mg IV hydralazine 185/82 hr 99. Pt to go to dialysis

## 2014-08-05 NOTE — Progress Notes (Signed)
Pt refused shoulder x-ray at this time.  Pt is nauseated, zofran ineffective.

## 2014-08-05 NOTE — Progress Notes (Signed)
Pt bp [id number] systollic. Per Dr. Ree Kida go ahead with the 10 mg IV hydalazine.  Pt to go to dialysis @ 1230-1330, called Kathaleen Maser RN, dialysis, advised gave hydralazine IV 10 mg for bp, stated that would be fine. Will be over shortly to take pt to dialysis.

## 2014-08-06 ENCOUNTER — Inpatient Hospital Stay (HOSPITAL_COMMUNITY): Payer: BC Managed Care – PPO

## 2014-08-06 LAB — RENAL FUNCTION PANEL
Albumin: 3.4 g/dL — ABNORMAL LOW (ref 3.5–5.0)
Anion gap: 16 — ABNORMAL HIGH (ref 5–15)
BUN: 29 mg/dL — ABNORMAL HIGH (ref 6–20)
CALCIUM: 8 mg/dL — AB (ref 8.9–10.3)
CHLORIDE: 88 mmol/L — AB (ref 101–111)
CO2: 21 mmol/L — ABNORMAL LOW (ref 22–32)
CREATININE: 5.45 mg/dL — AB (ref 0.44–1.00)
GFR calc non Af Amer: 8 mL/min — ABNORMAL LOW (ref >60)
GFR, EST AFRICAN AMERICAN: 9 mL/min — AB (ref >60)
Glucose, Bld: 137 mg/dL — ABNORMAL HIGH (ref 65–99)
PHOSPHORUS: 4.2 mg/dL (ref 2.5–4.6)
POTASSIUM: 4.3 mmol/L (ref 3.5–5.1)
Sodium: 125 mmol/L — ABNORMAL LOW (ref 135–145)

## 2014-08-06 LAB — CBC
HCT: 33.1 % — ABNORMAL LOW (ref 36.0–46.0)
Hemoglobin: 10.5 g/dL — ABNORMAL LOW (ref 12.0–15.0)
MCH: 28.7 pg (ref 26.0–34.0)
MCHC: 31.7 g/dL (ref 30.0–36.0)
MCV: 90.4 fL (ref 78.0–100.0)
Platelets: 169 10*3/uL (ref 150–400)
RBC: 3.66 MIL/uL — AB (ref 3.87–5.11)
RDW: 17.8 % — AB (ref 11.5–15.5)
WBC: 8.4 10*3/uL (ref 4.0–10.5)

## 2014-08-06 NOTE — Procedures (Signed)
Patient was seen on dialysis and the procedure was supervised.  BFR 300  Via PC BP is  high.   Patient appears to be tolerating treatment well- UF as able   Quincee Gittens A 08/06/2014

## 2014-08-06 NOTE — Progress Notes (Addendum)
[facility]                                                                              Patient Demographics  Erin Morgan, is a 59 y.o. female, DOB - May 27, 1955, [id number]  Admit date - 07/17/2014   Admitting Physician Erin Baker, MD  Outpatient Primary MD for the patient is Erin Girt, DO  LOS - 20   Chief Complaint  Patient presents with  . Headache  . Nausea  . Hypertension      Interim history 59 year old female with history of rheumatoid arthritis and osteopenia presented with 3-4 months of edema and generalized joint pain. Patient was recently started on methotrexate and prednisone. Patient did have worsening creatinine and anemia was admitted. Patient since admission has required dialysis. Felt to be secondary to questionable atypical HUS versus malignant hypertension. Disposition per nephrology.  Assessment & Plan   Acute progressive renal failure/?ESRD - renal biopsy with thrombotic microangiopathy, ? TTP (ADAMTS13 deficiency), HUS, immune mediated or toxin mediated - ? malignant HTN - Nephrology consulted and appreciated.   - Patient dialyzed 08/05/2014 with 3400cc removed, and is dialyzing today. - Nephrology feels this could be atypical HUS and will start patient on Soliris - Vein mapping ordered - Will defer vascular consult for now  HCAP with leukocytosis - Overnight, patient became dyspneic, without tachycardia or tachypnea - VQ scan ordered and showed no pulmonary embolism, was normal - Chest x-ray: Left lower lobe opacity with left pleural effusion - Continue levaquin (will need for 2 weeks with treatment of soliris) - Patient also received meningococcal vaccination  Nausea and vomiting - continue antiemetics PRN - continue to provide senna and add bisacodyl supp   Rheumatoid arthritis  - Continue prednisone 20 mg daily  - holding MTX  - appears reasonably stable at present  - no focal joint complaints  Acute  encephalopathy - resolved   Normocytic anemia - Iron studies suggest iron deficiency  - dosed with Feraheme 07/20/14 - Hg remains overall stable with no signs of active bleeding, currently 10.5  Hypervolemic Hyponatremia - Due to acute renal failure - Na 125 - Continue to monitor BMP  Hypokalemia - WNL this AM - daily renal panel   Uncontrolled hypertension/ urgency - currently on Norvasc, Hydralazine, Clonidine  - Patient had been on hydralazine prior to admission > anti-histone antibody negative - Echocardiogram notes no impairment in systolic fxn or WMA - Suspect BP will improve with dialysis today  Persistent waxing/waning HA  - Continue pain control as needed - orthostatic vitals negative   Inadequate oral intake in the setting of acute illness  - nutritionist consulted, recommendations appreciated   Right shoulder pain/fracture - Improved, continue capsicum cream and heating pad - X-ray showed stable appearance of fracture involving the right greater tuberosity  Code Status: Full  Family Communication: Parents at bedside  Disposition Plan: Admitted. Per nephrology.  Time Spent in minutes   30 minutes  Procedures  Renal Bx VQ scan  Consults   Nephrology  DVT Prophylaxis  SCDs  Lab Results  Component Value Date   PLT 169 08/06/2014    Medications  Scheduled Meds: . amLODipine  10  mg Oral QHS  . bisacodyl  10 mg Rectal BID  . calcium acetate  667 mg Oral TID WC  . capsicum oleoresin   Topical BID  . cloNIDine  0.1 mg Oral TID  . darbepoetin (ARANESP) injection - DIALYSIS  200 mcg Intravenous Q Thu-HD  . feeding supplement (NEPRO CARB STEADY)  237 mL Oral BID BM  . hydrALAZINE  50 mg Oral 3 times per day  . levofloxacin  500 mg Oral Q48H  . multivitamin  1 tablet Oral QHS  . neomycin-polymyxin b-dexamethasone  2 drop Right Eye Q12H  . NON FORMULARY 900 mg  900 mg Intravenous Weekly  . pantoprazole  20 mg Oral Daily  . predniSONE  20 mg  Oral QAC breakfast  . sodium chloride  3 mL Intravenous Q12H   Continuous Infusions: . sodium chloride 10 mL/hr at 07/18/14 1400  . sodium chloride     PRN Meds:.acetaminophen, butalbital-acetaminophen-caffeine, hydrALAZINE, HYDROcodone-acetaminophen, morphine injection, ondansetron **OR** ondansetron (ZOFRAN) IV, oxyCODONE-acetaminophen, prochlorperazine, promethazine  Antibiotics    Anti-infectives    Start     Dose/Rate Route Frequency Ordered Stop   08/06/14 1000  levofloxacin (LEVAQUIN) tablet 500 mg     500 mg Oral Every 48 hours 08/04/14 1100 08/19/14 2359   08/04/14 1130  levofloxacin (LEVAQUIN) tablet 750 mg     750 mg Oral  Once 08/04/14 1100 08/04/14 1108   07/24/14 1000  ceFAZolin (ANCEF) IVPB 2 g/50 mL premix    Comments:  Hang ON CALL to xray 7/20   2 g 100 mL/hr over 30 Minutes Intravenous On call 07/24/14 0908 07/24/14 1503      Subjective:   Erin Morgan seen and examined today.  Patient is feeling much better today. Denies any headache, shoulder pain, nausea today. Currently in dialysis. Denies chest pain or shortness of breath, abdominal pain.  Objective:   Filed Vitals:   08/06/14 1030 08/06/14 1059 08/06/14 1130 08/06/14 1147  BP: 219/98 234/95 215/101 196/107  Pulse: 104 98 106 115  Temp:    98.4 F (36.9 C)  TempSrc:    Oral  Resp:    18  Height:      Weight:    56.7 kg (125 lb)  SpO2:    99%    Wt Readings from Last 3 Encounters:  08/06/14 56.7 kg (125 lb)  04/05/14 62.143 kg (137 lb)  05/21/12 59.875 kg (132 lb)     Intake/Output Summary (Last 24 hours) at 08/06/14 1219 Last data filed at 08/06/14 1147  Gross per 24 hour  Intake   1306 ml  Output   7448 ml  Net  -6142 ml    Exam  General: Well developed, well nourished, no distress  HEENT: NCAT, mucous membranes moist.   Cardiovascular: S1 S2 auscultated, RRR, no murmurs  Respiratory:  Slightly diminished breath sounds at the bases, otherwise clear  Abdomen: Soft,  nontender, nondistended, + bowel sounds  Extremities: warm dry without cyanosis clubbing. Trace edema  Data Review   Micro Results No results found for this or any previous visit (from the past 240 hour(s)).  Radiology Reports Ct Abdomen Pelvis Wo Contrast  08/02/2014   CLINICAL DATA:  Acute renal failure.  Headache and nausea  EXAM: CT ABDOMEN AND PELVIS WITHOUT CONTRAST  TECHNIQUE: Multidetector CT imaging of the abdomen and pelvis was performed following the standard protocol without IV contrast.  COMPARISON:  None.  FINDINGS: Lower chest: Bilateral pleural effusions and basilar atelectasis.  Hepatobiliary: No focal  hepatic lesion. No biliary duct dilatation. Gallbladder is normal. Common bile duct is normal.  Pancreas: Pancreas is normal. No ductal dilatation. No pancreatic inflammation.  Spleen: Scattered granulomata within the spleen liver.  Adrenals/urinary tract: Adrenal glands are normal. No hydronephrosis. There is a intermediate density lesion extending from the posterior mid aspect of the LEFT kidney measuring 16 mm on image 28, series 2.  Stomach/Bowel: Stomach, small bowel are normal. Normal appendix is seen on image 30, series 5. There is inflammation within the fat surrounding the cecum and to lesser degree the ascending colon. There is a serosal inflammation along the cecum. The more distal. Transverse and descending colon are normal. The rectum is normal.  Vascular/Lymphatic: Abdominal aorta is normal caliber. There is no retroperitoneal or periportal lymphadenopathy. No pelvic lymphadenopathy.  Reproductive: Uterus and ovaries are normal.  Musculoskeletal: No aggressive osseous lesion.  Other: No free fluid.  IMPRESSION: 1. Inflammation primary involving the cecum. Differential would include ischemic colitis, infectious colitis and inflammatory bowel disease. 2. Indeterminate lesion extending posterior to the LEFT kidney. If the patient cannot receive IV contrast recommend ultrasound  and potentially MRI for further evaluation.   Electronically Signed   By: Suzy Bouchard M.D.   On: 08/02/2014 18:48   Dg Shoulder Right  07/09/2014   CLINICAL DATA:  Right shoulder injury.  Fall.  EXAM: RIGHT SHOULDER - 2+ VIEW  COMPARISON:  None.  FINDINGS: Fracture of the greater tuberosity of the proximal right humerus is present. No other abnormality identified. No evidence of dislocation or separation.  IMPRESSION: Minimally displaced fracture of the greater tuberosity of the proximal right humerus.   Electronically Signed   By: Marcello Moores  Register   On: 07/09/2014 13:58   Ct Head Wo Contrast  07/17/2014   CLINICAL DATA:  59 year old female with headache, nausea and leukocytosis  EXAM: CT HEAD WITHOUT CONTRAST  TECHNIQUE: Contiguous axial images were obtained from the base of the skull through the vertex without intravenous contrast.  COMPARISON:  None.  FINDINGS: Negative for acute intracranial hemorrhage, acute infarction, mass, mass effect, hydrocephalus or midline shift. Gray-white differentiation is preserved throughout. No acute soft tissue or calvarial abnormality. The globes and orbits are symmetric and unremarkable. Normal aeration of the mastoid air cells and visualized paranasal sinuses. Small sclerotic focus in the right frontal calvarium just above the orbit likely represents a benign bone island.  IMPRESSION: Negative head CT.   Electronically Signed   By: Jacqulynn Cadet M.D.   On: 07/17/2014 17:04   Mr Brain Wo Contrast  07/18/2014   CLINICAL DATA:  Initial evaluation for acute headache.  EXAM: MRI HEAD WITHOUT CONTRAST  TECHNIQUE: Multiplanar, multiecho pulse sequences of the brain and surrounding structures were obtained without intravenous contrast.  COMPARISON:  Prior CT from 07/17/2014  FINDINGS: The CSF containing spaces are within normal limits for patient age. Few scattered T2/FLAIR hyperintense foci present within the periventricular and deep white matter of both cerebral  hemispheres, nonspecific. No mass lesion, midline shift, or extra-axial fluid collection. Ventricles are normal in size without evidence of hydrocephalus.  No diffusion-weighted signal abnormality is identified to suggest acute intracranial infarct. Gray-white matter differentiation is maintained. Normal flow voids are seen within the intracranial vasculature. No intracranial hemorrhage identified.  The cervicomedullary junction is normal. Pituitary gland is within normal limits. Pituitary stalk is midline. The globes and optic nerves demonstrate a normal appearance with normal signal intensity. The  The bone marrow signal intensity is normal. Calvarium is intact. Visualized  upper cervical spine is within normal limits.  Scalp soft tissues are unremarkable.  Paranasal sinuses are clear.  No mastoid effusion.  IMPRESSION: 1. No acute intracranial process identified. 2. Few scattered subcentimeter T2/FLAIR height matter foci within the supratentorial white matter. These foci are nonspecific, but may be related to very mild chronic small vessel ischemic changes. These are felt to be within normal limits for patient age. 3. Otherwise normal brain MRI   Electronically Signed   By: Jeannine Boga M.D.   On: 07/18/2014 06:53   US Renal  07/18/2014   CLINICAL DATA:  Acute renal failure.  EXAM: RENAL / URINARY TRACT ULTRASOUND COMPLETE  COMPARISON:  None.  FINDINGS: Right Kidney:  Length: 10.0 cm. Echogenicity within normal limits. No mass or hydronephrosis visualized.  Left Kidney:  Length: 9.4 cm. Echogenicity within normal limits. No mass or hydronephrosis visualized.  Bladder:  Appears normal for degree of bladder distention.  Small bilateral pleural effusions incidentally noted.  IMPRESSION: No evidence of hydronephrosis or other renal abnormality.  Small bilateral pleural effusions incidentally noted.   Electronically Signed   By: Earle Gell M.D.   On: 07/18/2014 11:55   Nm Pulmonary Perf And  Vent  08/04/2014   CLINICAL DATA:  Acute shortness of breath.  EXAM: NUCLEAR MEDICINE VENTILATION - PERFUSION LUNG SCAN  TECHNIQUE: Ventilation images were obtained in multiple projections using inhaled aerosol Tc-77m DTPA. Perfusion images were obtained in multiple projections after intravenous injection of Tc-16m MAA.  RADIOPHARMACEUTICALS:  38.5 Technetium-71m DTPA aerosol inhalation and 5.9 Technetium-41m MAA IV  COMPARISON:  08/04/2014 chest radiograph  FINDINGS: Ventilation: No focal ventilation defect.  Perfusion: No wedge shaped peripheral perfusion defects to suggest acute pulmonary embolism.  IMPRESSION: Normal exam.   Electronically Signed   By: Margarette Canada M.D.   On: 08/04/2014 07:05   US Biopsy  07/23/2014   CLINICAL DATA:  Renal insufficiency  EXAM: ULTRASOUND BIOPSY  COMPARISON:  None.  FINDINGS: Imaging was provided to the Nephrology service for renal biopsy.  IMPRESSION: See above.   Electronically Signed   By: Marybelle Killings M.D.   On: 07/23/2014 13:26   Ir Fluoro Guide Cv Line Right  07/24/2014   CLINICAL DATA:  Acute renal failure, needs access for hemodialysis  EXAM: TUNNELED HEMODIALYSIS CATHETER PLACEMENT WITH ULTRASOUND AND FLUOROSCOPIC GUIDANCE  TECHNIQUE: The procedure, risks, benefits, and alternatives were explained to the patient. Questions regarding the procedure were encouraged and answered. The patient understands and consents to the procedure. As antibiotic prophylaxis, cefazolin 2 g was ordered pre-procedure and administered intravenously within one hour of incision.Patency of the right IJ vein was confirmed with ultrasound with image documentation. An appropriate skin site was determined. Region was prepped using maximum barrier technique including cap and mask, sterile gown, sterile gloves, large sterile sheet, and Chlorhexidine as cutaneous antisepsis. The region was infiltrated locally with 1% lidocaine.  Intravenous Fentanyl and Versed were administered as conscious  sedation during continuous cardiorespiratory monitoring by the radiology RN, with a total moderate sedation time of 10 minutes.  Under real-time ultrasound guidance, the right IJ vein was accessed with a 21 gauge micropuncture needle; the needle tip within the vein was confirmed with ultrasound image documentation. Needle exchanged over the 018 guidewire for transitional dilator, which allowed advancement of a Benson wire into the IVC. Over this, an MPA catheter was advanced. A Palindrome 19 hemodialysis catheter was tunneled from the right anterior chest wall approach to the right IJ dermatotomy site. The MPA  catheter was exchanged over an Amplatz wire for serial vascular dilators which allow placement of a peel-away sheath, through which the catheter was advanced under intermittent fluoroscopy, positioned with its tips in the proximal and midright atrium. Spot chest radiograph confirms good catheter position. No pneumothorax. Catheter was flushed and primed per protocol. Catheter secured externally with O Prolene suture. The right IJ dermatotomy site was closed with Dermabond.  COMPLICATIONS: COMPLICATIONS None immediate  FLUOROSCOPY TIME:  12 seconds, 3.7 mGy  COMPARISON:  None  IMPRESSION: 1. Technically successful placement of tunneled right IJ hemodialysis catheter with ultrasound and fluoroscopic guidance. Ready for routine use.   Electronically Signed   By: Lucrezia Europe M.D.   On: 07/24/2014 15:16   Ir US Guide Vasc Access Right  07/24/2014   CLINICAL DATA:  Acute renal failure, needs access for hemodialysis  EXAM: TUNNELED HEMODIALYSIS CATHETER PLACEMENT WITH ULTRASOUND AND FLUOROSCOPIC GUIDANCE  TECHNIQUE: The procedure, risks, benefits, and alternatives were explained to the patient. Questions regarding the procedure were encouraged and answered. The patient understands and consents to the procedure. As antibiotic prophylaxis, cefazolin 2 g was ordered pre-procedure and administered intravenously within  one hour of incision.Patency of the right IJ vein was confirmed with ultrasound with image documentation. An appropriate skin site was determined. Region was prepped using maximum barrier technique including cap and mask, sterile gown, sterile gloves, large sterile sheet, and Chlorhexidine as cutaneous antisepsis. The region was infiltrated locally with 1% lidocaine.  Intravenous Fentanyl and Versed were administered as conscious sedation during continuous cardiorespiratory monitoring by the radiology RN, with a total moderate sedation time of 10 minutes.  Under real-time ultrasound guidance, the right IJ vein was accessed with a 21 gauge micropuncture needle; the needle tip within the vein was confirmed with ultrasound image documentation. Needle exchanged over the 018 guidewire for transitional dilator, which allowed advancement of a Benson wire into the IVC. Over this, an MPA catheter was advanced. A Palindrome 19 hemodialysis catheter was tunneled from the right anterior chest wall approach to the right IJ dermatotomy site. The MPA catheter was exchanged over an Amplatz wire for serial vascular dilators which allow placement of a peel-away sheath, through which the catheter was advanced under intermittent fluoroscopy, positioned with its tips in the proximal and midright atrium. Spot chest radiograph confirms good catheter position. No pneumothorax. Catheter was flushed and primed per protocol. Catheter secured externally with O Prolene suture. The right IJ dermatotomy site was closed with Dermabond.  COMPLICATIONS: COMPLICATIONS None immediate  FLUOROSCOPY TIME:  12 seconds, 3.7 mGy  COMPARISON:  None  IMPRESSION: 1. Technically successful placement of tunneled right IJ hemodialysis catheter with ultrasound and fluoroscopic guidance. Ready for routine use.   Electronically Signed   By: Lucrezia Europe M.D.   On: 07/24/2014 15:16   Dg Chest Port 1 View  08/04/2014   CLINICAL DATA:  Shortness of breath.  EXAM:  PORTABLE CHEST - 1 VIEW  COMPARISON:  None.  FINDINGS: Tip of the right dialysis catheter in the distal SVC, projects over overlying oxygen tubing. Heart is at the upper limits of normal in size. Diffuse reticular opacities, may reflect pulmonary edema. Left lobe opacity with left pleural effusion. No pneumothorax.  IMPRESSION: 1. Left lower lobe opacity with left pleural effusion. This may reflect pneumonia with parapneumonic effusion, versus effusion with compressive atelectasis. 2. Diffuse reticular opacities, suspect pulmonary edema. Mild cardiomegaly.   Electronically Signed   By: Jeb Levering M.D.   On: 08/04/2014 05:16  Dg Shoulder Right Port  08/05/2014   CLINICAL DATA:  Pain.  EXAM: PORTABLE RIGHT SHOULDER - 2+ VIEW  COMPARISON:  07/09/2014  FINDINGS: Again noted is fracture of the right humerus, involving the greater tuberosity. No dislocation or new fracture identified. Right lung apex is clear. Right-sided dialysis catheter is present. There is degenerative change in the acromioclavicular joint.  IMPRESSION: Stable appearance of fracture involving the right greater tuberosity.   Electronically Signed   By: Nolon Nations M.D.   On: 08/05/2014 12:26   Dg Humerus Right  07/09/2014   CLINICAL DATA:  Injury to the right shoulder and humerus after fall on July 03, 2014 with pain.  EXAM: RIGHT HUMERUS - 2+ VIEW  COMPARISON:  None.  FINDINGS: There is comminuted displaced fracture of the lateral aspect of the proximal right humerus. The visualized right ribs and right lung are normal.  IMPRESSION: Fracture of lateral aspect of proximal right humerus.   Electronically Signed   By: Abelardo Diesel M.D.   On: 07/09/2014 13:58   Dg Hip Unilat With Pelvis 2-3 Views Right  07/09/2014   CLINICAL DATA:  Fall with injury to the right hip 07/03/2014. Pain and bruising. Decreased range of motion.  EXAM: RIGHT HIP (WITH PELVIS) 2-3 VIEWS  COMPARISON:  None.  FINDINGS: Right hip is located. No acute bone or soft  tissue abnormality is present. The pelvis is intact.  IMPRESSION: Negative right hip radiographs.   Electronically Signed   By: San Morelle M.D.   On: 07/09/2014 14:13    CBC  Recent Labs Lab 08/02/14 0433 08/03/14 0428 08/04/14 0539 08/05/14 0912 08/06/14 0534  WBC 10.6* 8.7 13.2* 15.6* 8.4  HGB 9.2* 8.7* 10.1* 10.6* 10.5*  HCT 27.7* 26.5* 30.2* 31.7* 33.1*  PLT 236 209 226 220 169  MCV 92.6 90.4 89.9 89.8 90.4  MCH 30.8 29.7 30.1 30.0 28.7  MCHC 33.2 32.8 33.4 33.4 31.7  RDW 17.4* 17.2* 17.3* 17.7* 17.8*    Chemistries   Recent Labs Lab 08/02/14 0433 08/03/14 0428 08/04/14 0539 08/05/14 0912 08/06/14 0534  NA 126* 121*  121* 120* 123* 125*  K 3.8 4.0  4.0 3.7 4.1 4.3  CL 91* 87*  87* 85* 88* 88*  CO2 23 20*  19* 20* 20* 21*  GLUCOSE 85 90  89 93 98 137*  BUN 32* 39*  39* 53* 48* 29*  CREATININE 6.12* 7.14*  7.08* 7.99* 7.76* 5.45*  CALCIUM 8.3* 8.2*  8.2* 8.5* 8.4* 8.0*   ------------------------------------------------------------------------------------------------------------------ estimated creatinine clearance is 9.5 mL/min (by C-G formula based on Cr of 5.45). ------------------------------------------------------------------------------------------------------------------ No results for input(s): HGBA1C in the last 72 hours. ------------------------------------------------------------------------------------------------------------------ No results for input(s): CHOL, HDL, LDLCALC, TRIG, CHOLHDL, LDLDIRECT in the last 72 hours. ------------------------------------------------------------------------------------------------------------------ No results for input(s): TSH, T4TOTAL, T3FREE, THYROIDAB in the last 72 hours.  Invalid input(s): FREET3 ------------------------------------------------------------------------------------------------------------------ No results for input(s): VITAMINB12, FOLATE, FERRITIN, TIBC, IRON, RETICCTPCT in the  last 72 hours.  Coagulation profile No results for input(s): INR, PROTIME in the last 168 hours.  No results for input(s): DDIMER in the last 72 hours.  Cardiac Enzymes No results for input(s): CKMB, TROPONINI, MYOGLOBIN in the last 168 hours.  Invalid input(s): CK ------------------------------------------------------------------------------------------------------------------ Invalid input(s): POCBNP    Mireille Lacombe D.O. on 08/06/2014 at 12:19 PM  Between 7am to 7pm - Pager - [phone]  After 7pm go to [email or web address] - password [email or web address]  And look for the night coverage person covering for me after hours  [facility] Office  [phone]

## 2014-08-06 NOTE — Progress Notes (Signed)
Transporter at bedside to get pt from ultrasound confirmed by Dr. Edmonia James Vein Mapping cancelled and put on hold until further notice.

## 2014-08-06 NOTE — Progress Notes (Signed)
Patient refusing bed alarm this evening shift.  Bed in lowest position.  Emphasized use of call bell if needed to use the bathroom; verbalized understanding.  Will continue to monitor patient.

## 2014-08-06 NOTE — Progress Notes (Signed)
Subjective:  Feels and looks better today - removed 3400 yest and on deck to remove 4000 today.  Filling out paperwork to get her a dose of Soliris.  Patient is on board  Objective Vital signs in last 24 hours: Filed Vitals:   08/06/14 0745 08/06/14 0750 08/06/14 0820 08/06/14 0855  BP: 195/80 199/89 223/81 214/97  Pulse: 93 93 92 96  Temp:      TempSrc:      Resp:      Height:      Weight:      SpO2:       Weight change: -3.3 kg (-7 lb 4.4 oz)  Intake/Output Summary (Last 24 hours) at 08/06/14 0932 Last data filed at 08/06/14 0600  Gross per 24 hour  Intake   1306 ml  Output   3462 ml  Net  -2156 ml    Assessment/ Plan: Pt is a 59 y.o. yo female who was admitted on 07/17/2014 with  AKI and malignant HTN after being diagnosed with possible RA-  Assessment/Plan: 1. AKI- was told renal function was normal in June- was 2.3 in July after being started on methotrexate but then held.  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  We are looking into getting her started on this.  It is given as a weekly infusion for 4 weeks and I am told to expect a response in 6-8 weeks.  True that she is dialysis requiring right now and her vein mapping has been done- I think we can hold off on the actual permanent access to see if she gets a response from this medication that can help her to recover renal function.  However, she understands in the interim that we will continue with HD and will get her assigned to an OP unit and if the soliris is not successful that we would need to place a permanent access at that time.    2. Volume- needs more aggressive volume  removal - another 4 liters today, then plan next for Thursday  3. Anemia- trending up - on ESA 4. RA- on prednisone of 20 daily 5. Dispo- so once she gets her first infusion of soliris and we have an OP kidney center assigned would be able to be discharged- by end of the week ??  6. Requirements for soliris- given meningicoccal vaccine and will continue levaquin for 2 weeks s/p vaccine to cover    Spray: Basic Metabolic Panel:  Recent Labs Lab 08/04/14 0539 08/05/14 0912 08/06/14 0534  NA 120* 123* 125*  K 3.7 4.1 4.3  CL 85* 88* 88*  CO2 20* 20* 21*  GLUCOSE 93 98 137*  BUN 53* 48* 29*  CREATININE 7.99* 7.76* 5.45*  CALCIUM 8.5* 8.4* 8.0*  PHOS 4.1 4.1 4.2   Liver Function Tests:  Recent Labs Lab 08/04/14 0539 08/05/14 0912 08/06/14 0534  ALBUMIN 3.6 3.4* 3.4*   No results for input(s): LIPASE, AMYLASE in the last 168 hours. No results for input(s): AMMONIA in the last 168 hours. CBC:  Recent Labs Lab 08/02/14 [id number] 08/03/14 [id number] 08/04/14 0539 08/05/14 0912 08/06/14 0534  WBC 10.6* 8.7 13.2* 15.6* 8.4  HGB 9.2* 8.7* 10.1* 10.6* 10.5*  HCT 27.7* 26.5* 30.2* 31.7* 33.1*  MCV 92.6 90.4 89.9 89.8 90.4  PLT 236 209 226 220 169   Cardiac Enzymes: No results for input(s): CKTOTAL, CKMB, CKMBINDEX, TROPONINI in the last 168 hours. CBG: No results for input(s): GLUCAP in the last 168 hours.  Iron Studies: No results for input(s): IRON, TIBC, TRANSFERRIN, FERRITIN in the last 72 hours. Studies/Results: Dg Shoulder Right Port  08/05/2014   CLINICAL DATA:  Pain.  EXAM: PORTABLE RIGHT SHOULDER - 2+ VIEW  COMPARISON:  07/09/2014  FINDINGS: Again noted is fracture of the right humerus, involving the greater tuberosity. No dislocation or new fracture identified. Right lung apex is clear. Right-sided dialysis catheter is present. There is degenerative change in the acromioclavicular joint.  IMPRESSION: Stable appearance of fracture involving the  right greater tuberosity.   Electronically Signed   By: Nolon Nations M.D.   On: 08/05/2014 12:26   Medications: Infusions: . sodium chloride 10 mL/hr at 07/18/14 1400  . sodium chloride      Scheduled Medications: . amLODipine  10 mg Oral QHS  . bisacodyl  10 mg Rectal BID  . calcium acetate  667 mg Oral TID WC  . capsicum oleoresin   Topical BID  . cloNIDine  0.1 mg Oral TID  . darbepoetin (ARANESP) injection - DIALYSIS  200 mcg Intravenous Q Thu-HD  . feeding supplement (NEPRO CARB STEADY)  237 mL Oral BID BM  . hydrALAZINE  50 mg Oral 3 times per day  . levofloxacin  500 mg Oral Q48H  . multivitamin  1 tablet Oral QHS  . neomycin-polymyxin b-dexamethasone  2 drop Right Eye Q12H  . NON FORMULARY 900 mg  900 mg Intravenous Weekly  . pantoprazole  20 mg Oral Daily  . predniSONE  20 mg Oral QAC breakfast  . sodium chloride  3 mL Intravenous Q12H    have reviewed scheduled and prn medications.  Physical Exam: General: looks better Heart: RRR Lungs: dec BS at bases Abdomen: distended Extremities: pitting edema Dialysis Access: PC placed 7/20   08/06/2014,9:32 AM  LOS: 20 days

## 2014-08-07 DIAGNOSIS — I10 Essential (primary) hypertension: Secondary | ICD-10-CM

## 2014-08-07 DIAGNOSIS — R7989 Other specified abnormal findings of blood chemistry: Secondary | ICD-10-CM

## 2014-08-07 LAB — CBC
HCT: 30.6 % — ABNORMAL LOW (ref 36.0–46.0)
Hemoglobin: 9.8 g/dL — ABNORMAL LOW (ref 12.0–15.0)
MCH: 29.5 pg (ref 26.0–34.0)
MCHC: 32 g/dL (ref 30.0–36.0)
MCV: 92.2 fL (ref 78.0–100.0)
Platelets: 133 10*3/uL — ABNORMAL LOW (ref 150–400)
RBC: 3.32 MIL/uL — AB (ref 3.87–5.11)
RDW: 18 % — ABNORMAL HIGH (ref 11.5–15.5)
WBC: 11.7 10*3/uL — AB (ref 4.0–10.5)

## 2014-08-07 LAB — RENAL FUNCTION PANEL
ANION GAP: 8 (ref 5–15)
Albumin: 3.4 g/dL — ABNORMAL LOW (ref 3.5–5.0)
BUN: 16 mg/dL (ref 6–20)
CALCIUM: 8.4 mg/dL — AB (ref 8.9–10.3)
CHLORIDE: 95 mmol/L — AB (ref 101–111)
CO2: 27 mmol/L (ref 22–32)
Creatinine, Ser: 3.99 mg/dL — ABNORMAL HIGH (ref 0.44–1.00)
GFR calc Af Amer: 13 mL/min — ABNORMAL LOW (ref >60)
GFR, EST NON AFRICAN AMERICAN: 11 mL/min — AB (ref >60)
GLUCOSE: 97 mg/dL (ref 65–99)
PHOSPHORUS: 3.1 mg/dL (ref 2.5–4.6)
POTASSIUM: 3.7 mmol/L (ref 3.5–5.1)
Sodium: 130 mmol/L — ABNORMAL LOW (ref 135–145)

## 2014-08-07 LAB — HEPATITIS B SURFACE ANTIGEN: HEP B S AG: NEGATIVE

## 2014-08-07 LAB — HEPATITIS B SURFACE ANTIBODY,QUALITATIVE: HEP B S AB: NONREACTIVE

## 2014-08-07 LAB — HEPATITIS B CORE ANTIBODY, TOTAL: Hep B Core Total Ab: NEGATIVE

## 2014-08-07 MED ORDER — NEPRO/CARBSTEADY PO LIQD: 237.0000 mL | Freq: Every day | ORAL | Status: DC

## 2014-08-07 MED ORDER — BOOST / RESOURCE BREEZE PO LIQD
1.0000 | Freq: Two times a day (BID) | ORAL | Status: DC
  Administered 2014-08-08 – 2014-08-09 (×3): 1 via ORAL

## 2014-08-07 NOTE — Progress Notes (Signed)
Subjective:  Feels and looks better today - removed 3400 [address] on deck to remove 4000 yest.  Still feels like she has fluid on.  Pharmacy tells me that she will get Soliris tomorrow Objective Vital signs in last 24 hours: Filed Vitals:   08/06/14 1248 08/06/14 2226 08/07/14 0532 08/07/14 1000  BP: 165/99 164/82 147/74 158/76  Pulse: 129 90 86 94  Temp: 98.2 F (36.8 C) 98.7 F (37.1 C) 98.6 F (37 C) 98.6 F (37 C)  TempSrc: Oral Oral Oral Oral  Resp: 19 16 18 18   Height:      Weight:      SpO2: 97% 98% 97% 98%   Weight change: -2.1 kg (-4 lb 10.1 oz)  Intake/Output Summary (Last 24 hours) at 08/07/14 1149 Last data filed at 08/07/14 0900  Gross per 24 hour  Intake    787 ml  Output      0 ml  Net    787 ml    Assessment/ Plan: Pt is a 59 y.o. yo female who was admitted on 07/17/2014 with  AKI and malignant HTN after being diagnosed with possible RA-  Assessment/Plan: 1. AKI/ESRD- was told renal function was normal in June- was 2.3 in July after being started on methotrexate but then held.  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  We are looking into getting her started on this.  Tentatively planned to get first dose tomorrow.  It is given as a weekly infusion for 4 weeks and I am told to expect a response maybe in 8-12 weeks.  True that she is dialysis requiring right now and will need an OP unit to manage her ESRD for now but  I think we can hold off on the actual permanent access to see if she gets a response from this medication that can help her to recover renal function but this could take some time.  However, she understands in  the interim that we will continue with HD and will get her assigned to an OP unit and if the soliris is not successful that we would need to place a permanent access at that time.  It would not be feasible to keep her in house until response is seen  2. Volume- needs more aggressive volume removal - is nervous but Ok to wait til tomorrow for next HD treatment 3. Anemia- trending up - on ESA 4. RA- on prednisone of 20 daily 5. Dispo- so once she gets her first infusion of soliris and we have an OP kidney center assigned would be able to be discharged- by end of the week ??  6. Requirements for soliris- given meningicoccal vaccine and will continue levaquin for 2 weeks s/p vaccine to cover    Erin Morgan A    Labs: Basic Metabolic Panel:  Recent Labs Lab 08/05/14 0912 08/06/14 0534 08/07/14 0542  NA 123* 125* 130*  K 4.1 4.3 3.7  CL 88* 88* 95*  CO2 20* 21* 27  GLUCOSE 98 137* 97  BUN 48* 29* 16  CREATININE 7.76* 5.45* 3.99*  CALCIUM 8.4* 8.0* 8.4*  PHOS 4.1 4.2 3.1   Liver Function  Tests:  Recent Labs Lab 08/05/14 0912 08/06/14 0534 08/07/14 0542  ALBUMIN 3.4* 3.4* 3.4*   No results for input(s): LIPASE, AMYLASE in the last 168 hours. No results for input(s): AMMONIA in the last 168 hours. CBC:  Recent Labs Lab 08/03/14 0428 08/04/14 0539 08/05/14 0912 08/06/14 0534 08/07/14 0542  WBC 8.7 13.2* 15.6* 8.4 11.7*  HGB 8.7* 10.1* 10.6* 10.5* 9.8*  HCT 26.5* 30.2* 31.7* 33.1* 30.6*  MCV 90.4 89.9 89.8 90.4 92.2  PLT 209 226 220 169 133*   Cardiac Enzymes: No results for input(s): CKTOTAL, CKMB, CKMBINDEX, TROPONINI in the last 168 hours. CBG: No results for input(s): GLUCAP in the last 168 hours.  Iron Studies: No results for input(s): IRON, TIBC, TRANSFERRIN, FERRITIN in the last 72 hours. Studies/Results: Dg Shoulder Right Port  08/05/2014   CLINICAL DATA:  Pain.  EXAM: PORTABLE RIGHT SHOULDER - 2+ VIEW  COMPARISON:  07/09/2014  FINDINGS: Again  noted is fracture of the right humerus, involving the greater tuberosity. No dislocation or new fracture identified. Right lung apex is clear. Right-sided dialysis catheter is present. There is degenerative change in the acromioclavicular joint.  IMPRESSION: Stable appearance of fracture involving the right greater tuberosity.   Electronically Signed   By: Nolon Nations M.D.   On: 08/05/2014 12:26   Medications: Infusions: . sodium chloride 10 mL/hr at 07/18/14 1400  . sodium chloride      Scheduled Medications: . amLODipine  10 mg Oral QHS  . bisacodyl  10 mg Rectal BID  . calcium acetate  667 mg Oral TID WC  . capsicum oleoresin   Topical BID  . cloNIDine  0.1 mg Oral TID  . darbepoetin (ARANESP) injection - DIALYSIS  200 mcg Intravenous Q Thu-HD  . feeding supplement (NEPRO CARB STEADY)  237 mL Oral BID BM  . hydrALAZINE  50 mg Oral 3 times per day  . levofloxacin  500 mg Oral Q48H  . multivitamin  1 tablet Oral QHS  . neomycin-polymyxin b-dexamethasone  2 drop Right Eye Q12H  . NON FORMULARY 900 mg  900 mg Intravenous Weekly  . pantoprazole  20 mg Oral Daily  . predniSONE  20 mg Oral QAC breakfast  . sodium chloride  3 mL Intravenous Q12H    have reviewed scheduled and prn medications.  Physical Exam: General: looks better Heart: RRR Lungs: dec BS at bases Abdomen: distended Extremities: pitting edema- but better Dialysis Access: PC placed 7/20   08/07/2014,11:49 AM  LOS: 21 days

## 2014-08-07 NOTE — Progress Notes (Signed)
Nutrition Follow-up  DOCUMENTATION CODES:   Not applicable  INTERVENTION:  Provide Boost Breeze po BID, each supplement provides 250 kcal and 9 grams of protein.  Provide Nepro Shake po once daily, each supplement provides 425 kcal and 19 grams protein.  Provide nourishment snacks.  Encourage adequate PO intake.   NUTRITION DIAGNOSIS:   Inadequate oral intake related to poor appetite as evidenced by meal completion < 50%; improving   GOAL:   Patient will meet greater than or equal to 90% of their needs; progressing  MONITOR:   PO intake, Supplement acceptance, Weight trends, Labs, I & O's  REASON FOR ASSESSMENT:   Consult Assessment of nutrition requirement/status  ASSESSMENT:   59 y.o. female w/ a history of RA and osteopenia who presented with 3-4 months of edema and generalized joint pain. Patient was diagnosed with RA in May and was tx w/ prednisone initially and then was started on methotrexate. Due to rising Cr and anemia the methotrexate was stopped with last dose being almost 2 weeks prior to this admission. Presents with ARF . preliminary renal biopsy showed thrombotic microangiopathy, ? TTP (ADAMTS13 deficiency), HUS, immune mediated or toxin mediated.  Meal completion has been varied from 0-100%. Pt currently has Nepro Shake ordered however has been refusing most of them. RD to order Resource Breeze to aid in caloric and protein needs. Continue to encourage adqeuate PO intake. RD to continue to monitor.   Labs and medications reviewed.   Diet Order:  Diet renal with fluid restriction Fluid restriction:: 1200 mL Fluid; Room service appropriate?: Yes; Fluid consistency:: Thin  Skin:   (Incision on L flank, +2 generalized edema)  Last BM:  8/2  Height:   Ht Readings from Last 1 Encounters:  08/04/14 5\' 4"  (1.626 m)    Weight:   Wt Readings from Last 1 Encounters:  08/06/14 125 lb (56.7 kg)    Ideal Body Weight:  54.5 kg  BMI:  Body mass index is  21.45 kg/(m^2).  Estimated Nutritional Needs:   Kcal:  1700-1900  Protein:  80-95 grams  Fluid:  1.2 L/day  EDUCATION NEEDS:   No education needs identified at this time  Corrin Parker, MS, RD, LDN Pager # [phone] After hours/ weekend pager # [phone]

## 2014-08-07 NOTE — Progress Notes (Signed)
08/07/2014 3:56 PM Hemodialysis Outpatient Note; this patient has been accepted at the [facility] on a Tuesday, Thursday and Saturday 2nd shift schedule. Thank you.

## 2014-08-07 NOTE — Progress Notes (Signed)
[facility]                                                                              Patient Demographics  Erin Morgan, is a 59 y.o. female, DOB - October 24, 1955, [id number]  Admit date - 07/17/2014   Admitting Physician Toy Baker, MD  Outpatient Primary MD for the patient is Nicola Girt, DO  LOS - 21   Chief Complaint  Patient presents with  . Headache  . Nausea  . Hypertension      Interim history 59 year old female with history of rheumatoid arthritis and osteopenia presented with 3-4 months of edema and generalized joint pain. Patient was recently started on methotrexate and prednisone. Patient did have worsening creatinine and anemia was admitted. Patient since admission has required dialysis. Felt to be secondary to questionable atypical HUS versus malignant hypertension. Disposition per nephrology.  Assessment & Plan   Acute progressive renal failure/?ESRD - renal biopsy with thrombotic microangiopathy, ? TTP (ADAMTS13 deficiency), HUS, immune mediated or toxin mediated - ? malignant HTN - Nephrology consulted and currently assisting with management  - Patient dialyzed 08/05/2014 with 3400cc removed, and is dialyzing today. - Nephrology feels this could be atypical HUS and will start patient on Soliris. Patient was given meningococcal vaccine and will continue Levaquin for 2 weeks status post vaccine to cover. - Will defer vascular consult for now  HCAP with leukocytosis - Overnight, patient became dyspneic, without tachycardia or tachypnea - VQ scan ordered and showed no pulmonary embolism, was normal - Chest x-ray: Left lower lobe opacity with left pleural effusion - Continue levaquin (will need for 2 weeks with treatment of soliris) - Patient also received meningococcal vaccination  Nausea and vomiting - continue antiemetics PRN - continue to provide senna and add bisacodyl supp   Rheumatoid arthritis  - Continue prednisone 20 mg  daily  - holding MTX  - appears reasonably stable at present  - no focal joint complaints  Acute encephalopathy - resolved   Normocytic anemia - Iron studies suggest iron deficiency  - dosed with Feraheme 07/20/14 - Hg remains overall stable with no signs of active bleeding, currently 9.8  Hypervolemic Hyponatremia - Due to acute renal failure - improving  Hypokalemia - WNL this AM - daily renal panel   Uncontrolled hypertension/ urgency - currently on Norvasc, Hydralazine, Clonidine  - Patient had been on hydralazine prior to admission > anti-histone antibody negative - Echocardiogram notes no impairment in systolic fxn or WMA - Suspect BP will improve with dialysis today  Persistent waxing/waning HA  - Continue pain control as needed - orthostatic vitals negative   Inadequate oral intake in the setting of acute illness  - nutritionist consulted, recommendations appreciated   Right shoulder pain/fracture - Improved, continue capsicum cream and heating pad - X-ray showed stable appearance of fracture involving the right greater tuberosity  Code Status: Full  Family Communication: Parents at bedside  Disposition Plan: Admitted. Per nephrology.  Time Spent in minutes   35 minutes  Procedures  Renal Bx VQ scan  Consults   Nephrology  DVT Prophylaxis  SCDs  Lab Results  Component Value Date   PLT 133* 08/07/2014  Medications  Scheduled Meds: . amLODipine  10 mg Oral QHS  . bisacodyl  10 mg Rectal BID  . calcium acetate  667 mg Oral TID WC  . capsicum oleoresin   Topical BID  . cloNIDine  0.1 mg Oral TID  . darbepoetin (ARANESP) injection - DIALYSIS  200 mcg Intravenous Q Thu-HD  . feeding supplement  1 Container Oral BID BM  . [START ON 08/08/2014] feeding supplement (NEPRO CARB STEADY)  237 mL Oral Q1500  . hydrALAZINE  50 mg Oral 3 times per day  . levofloxacin  500 mg Oral Q48H  . multivitamin  1 tablet Oral QHS  . neomycin-polymyxin  b-dexamethasone  2 drop Right Eye Q12H  . NON FORMULARY 900 mg  900 mg Intravenous Weekly  . pantoprazole  20 mg Oral Daily  . predniSONE  20 mg Oral QAC breakfast  . sodium chloride  3 mL Intravenous Q12H   Continuous Infusions: . sodium chloride 10 mL/hr at 07/18/14 1400  . sodium chloride     PRN Meds:.acetaminophen, butalbital-acetaminophen-caffeine, hydrALAZINE, HYDROcodone-acetaminophen, morphine injection, ondansetron **OR** ondansetron (ZOFRAN) IV, oxyCODONE-acetaminophen, prochlorperazine, promethazine  Antibiotics    Anti-infectives    Start     Dose/Rate Route Frequency Ordered Stop   08/06/14 1000  levofloxacin (LEVAQUIN) tablet 500 mg     500 mg Oral Every 48 hours 08/04/14 1100 08/19/14 2359   08/04/14 1130  levofloxacin (LEVAQUIN) tablet 750 mg     750 mg Oral  Once 08/04/14 1100 08/04/14 1108   07/24/14 1000  ceFAZolin (ANCEF) IVPB 2 g/50 mL premix    Comments:  Hang ON CALL to xray 7/20   2 g 100 mL/hr over 30 Minutes Intravenous On call 07/24/14 0908 07/24/14 1503      Subjective:   Erin Morgan Pt has no new complaints.  Objective:   Filed Vitals:   08/06/14 2226 08/07/14 0532 08/07/14 1000 08/07/14 1731  BP: 164/82 147/74 158/76 148/77  Pulse: 90 86 94 88  Temp: 98.7 F (37.1 C) 98.6 F (37 C) 98.6 F (37 C) 98 F (36.7 C)  TempSrc: Oral Oral Oral Oral  Resp: 16 18 18 18   Height:      Weight:      SpO2: 98% 97% 98% 98%    Wt Readings from Last 3 Encounters:  08/06/14 56.7 kg (125 lb)  04/05/14 62.143 kg (137 lb)  05/21/12 59.875 kg (132 lb)     Intake/Output Summary (Last 24 hours) at 08/07/14 1752 Last data filed at 08/07/14 1700  Gross per 24 hour  Intake   1267 ml  Output    300 ml  Net    967 ml    Exam  General: Well developed, well nourished, no distress  HEENT: NCAT, mucous membranes moist.   Cardiovascular: S1 S2 auscultated, RRR, no murmurs  Respiratory:  No increased wob, no wheezes, equal chest  rise.  Abdomen: Soft, nontender, nondistended, + bowel sounds  Extremities: warm dry without cyanosis clubbing. Trace edema  Data Review   Micro Results No results found for this or any previous visit (from the past 240 hour(s)).  Radiology Reports Ct Abdomen Pelvis Wo Contrast  08/02/2014   CLINICAL DATA:  Acute renal failure.  Headache and nausea  EXAM: CT ABDOMEN AND PELVIS WITHOUT CONTRAST  TECHNIQUE: Multidetector CT imaging of the abdomen and pelvis was performed following the standard protocol without IV contrast.  COMPARISON:  None.  FINDINGS: Lower chest: Bilateral pleural effusions and basilar atelectasis.  Hepatobiliary: No focal hepatic lesion. No biliary duct dilatation. Gallbladder is normal. Common bile duct is normal.  Pancreas: Pancreas is normal. No ductal dilatation. No pancreatic inflammation.  Spleen: Scattered granulomata within the spleen liver.  Adrenals/urinary tract: Adrenal glands are normal. No hydronephrosis. There is a intermediate density lesion extending from the posterior mid aspect of the LEFT kidney measuring 16 mm on image 28, series 2.  Stomach/Bowel: Stomach, small bowel are normal. Normal appendix is seen on image 30, series 5. There is inflammation within the fat surrounding the cecum and to lesser degree the ascending colon. There is a serosal inflammation along the cecum. The more distal. Transverse and descending colon are normal. The rectum is normal.  Vascular/Lymphatic: Abdominal aorta is normal caliber. There is no retroperitoneal or periportal lymphadenopathy. No pelvic lymphadenopathy.  Reproductive: Uterus and ovaries are normal.  Musculoskeletal: No aggressive osseous lesion.  Other: No free fluid.  IMPRESSION: 1. Inflammation primary involving the cecum. Differential would include ischemic colitis, infectious colitis and inflammatory bowel disease. 2. Indeterminate lesion extending posterior to the LEFT kidney. If the patient cannot receive IV contrast  recommend ultrasound and potentially MRI for further evaluation.   Electronically Signed   By: Suzy Bouchard M.D.   On: 08/02/2014 18:48   Dg Shoulder Right  07/09/2014   CLINICAL DATA:  Right shoulder injury.  Fall.  EXAM: RIGHT SHOULDER - 2+ VIEW  COMPARISON:  None.  FINDINGS: Fracture of the greater tuberosity of the proximal right humerus is present. No other abnormality identified. No evidence of dislocation or separation.  IMPRESSION: Minimally displaced fracture of the greater tuberosity of the proximal right humerus.   Electronically Signed   By: Marcello Moores  Register   On: 07/09/2014 13:58   Ct Head Wo Contrast  07/17/2014   CLINICAL DATA:  59 year old female with headache, nausea and leukocytosis  EXAM: CT HEAD WITHOUT CONTRAST  TECHNIQUE: Contiguous axial images were obtained from the base of the skull through the vertex without intravenous contrast.  COMPARISON:  None.  FINDINGS: Negative for acute intracranial hemorrhage, acute infarction, mass, mass effect, hydrocephalus or midline shift. Gray-white differentiation is preserved throughout. No acute soft tissue or calvarial abnormality. The globes and orbits are symmetric and unremarkable. Normal aeration of the mastoid air cells and visualized paranasal sinuses. Small sclerotic focus in the right frontal calvarium just above the orbit likely represents a benign bone island.  IMPRESSION: Negative head CT.   Electronically Signed   By: Jacqulynn Cadet M.D.   On: 07/17/2014 17:04   Mr Brain Wo Contrast  07/18/2014   CLINICAL DATA:  Initial evaluation for acute headache.  EXAM: MRI HEAD WITHOUT CONTRAST  TECHNIQUE: Multiplanar, multiecho pulse sequences of the brain and surrounding structures were obtained without intravenous contrast.  COMPARISON:  Prior CT from 07/17/2014  FINDINGS: The CSF containing spaces are within normal limits for patient age. Few scattered T2/FLAIR hyperintense foci present within the periventricular and deep white matter  of both cerebral hemispheres, nonspecific. No mass lesion, midline shift, or extra-axial fluid collection. Ventricles are normal in size without evidence of hydrocephalus.  No diffusion-weighted signal abnormality is identified to suggest acute intracranial infarct. Gray-white matter differentiation is maintained. Normal flow voids are seen within the intracranial vasculature. No intracranial hemorrhage identified.  The cervicomedullary junction is normal. Pituitary gland is within normal limits. Pituitary stalk is midline. The globes and optic nerves demonstrate a normal appearance with normal signal intensity. The  The bone marrow signal intensity is normal. Calvarium  is intact. Visualized upper cervical spine is within normal limits.  Scalp soft tissues are unremarkable.  Paranasal sinuses are clear.  No mastoid effusion.  IMPRESSION: 1. No acute intracranial process identified. 2. Few scattered subcentimeter T2/FLAIR height matter foci within the supratentorial white matter. These foci are nonspecific, but may be related to very mild chronic small vessel ischemic changes. These are felt to be within normal limits for patient age. 3. Otherwise normal brain MRI   Electronically Signed   By: Jeannine Boga M.D.   On: 07/18/2014 06:53   US Renal  07/18/2014   CLINICAL DATA:  Acute renal failure.  EXAM: RENAL / URINARY TRACT ULTRASOUND COMPLETE  COMPARISON:  None.  FINDINGS: Right Kidney:  Length: 10.0 cm. Echogenicity within normal limits. No mass or hydronephrosis visualized.  Left Kidney:  Length: 9.4 cm. Echogenicity within normal limits. No mass or hydronephrosis visualized.  Bladder:  Appears normal for degree of bladder distention.  Small bilateral pleural effusions incidentally noted.  IMPRESSION: No evidence of hydronephrosis or other renal abnormality.  Small bilateral pleural effusions incidentally noted.   Electronically Signed   By: Earle Gell M.D.   On: 07/18/2014 11:55   Nm Pulmonary Perf  And Vent  08/04/2014   CLINICAL DATA:  Acute shortness of breath.  EXAM: NUCLEAR MEDICINE VENTILATION - PERFUSION LUNG SCAN  TECHNIQUE: Ventilation images were obtained in multiple projections using inhaled aerosol Tc-50m DTPA. Perfusion images were obtained in multiple projections after intravenous injection of Tc-61m MAA.  RADIOPHARMACEUTICALS:  38.5 Technetium-79m DTPA aerosol inhalation and 5.9 Technetium-29m MAA IV  COMPARISON:  08/04/2014 chest radiograph  FINDINGS: Ventilation: No focal ventilation defect.  Perfusion: No wedge shaped peripheral perfusion defects to suggest acute pulmonary embolism.  IMPRESSION: Normal exam.   Electronically Signed   By: Margarette Canada M.D.   On: 08/04/2014 07:05   US Biopsy  07/23/2014   CLINICAL DATA:  Renal insufficiency  EXAM: ULTRASOUND BIOPSY  COMPARISON:  None.  FINDINGS: Imaging was provided to the Nephrology service for renal biopsy.  IMPRESSION: See above.   Electronically Signed   By: Marybelle Killings M.D.   On: 07/23/2014 13:26   Ir Fluoro Guide Cv Line Right  07/24/2014   CLINICAL DATA:  Acute renal failure, needs access for hemodialysis  EXAM: TUNNELED HEMODIALYSIS CATHETER PLACEMENT WITH ULTRASOUND AND FLUOROSCOPIC GUIDANCE  TECHNIQUE: The procedure, risks, benefits, and alternatives were explained to the patient. Questions regarding the procedure were encouraged and answered. The patient understands and consents to the procedure. As antibiotic prophylaxis, cefazolin 2 g was ordered pre-procedure and administered intravenously within one hour of incision.Patency of the right IJ vein was confirmed with ultrasound with image documentation. An appropriate skin site was determined. Region was prepped using maximum barrier technique including cap and mask, sterile gown, sterile gloves, large sterile sheet, and Chlorhexidine as cutaneous antisepsis. The region was infiltrated locally with 1% lidocaine.  Intravenous Fentanyl and Versed were administered as conscious  sedation during continuous cardiorespiratory monitoring by the radiology RN, with a total moderate sedation time of 10 minutes.  Under real-time ultrasound guidance, the right IJ vein was accessed with a 21 gauge micropuncture needle; the needle tip within the vein was confirmed with ultrasound image documentation. Needle exchanged over the 018 guidewire for transitional dilator, which allowed advancement of a Benson wire into the IVC. Over this, an MPA catheter was advanced. A Palindrome 19 hemodialysis catheter was tunneled from the right anterior chest wall approach to the right IJ dermatotomy  site. The MPA catheter was exchanged over an Amplatz wire for serial vascular dilators which allow placement of a peel-away sheath, through which the catheter was advanced under intermittent fluoroscopy, positioned with its tips in the proximal and midright atrium. Spot chest radiograph confirms good catheter position. No pneumothorax. Catheter was flushed and primed per protocol. Catheter secured externally with O Prolene suture. The right IJ dermatotomy site was closed with Dermabond.  COMPLICATIONS: COMPLICATIONS None immediate  FLUOROSCOPY TIME:  12 seconds, 3.7 mGy  COMPARISON:  None  IMPRESSION: 1. Technically successful placement of tunneled right IJ hemodialysis catheter with ultrasound and fluoroscopic guidance. Ready for routine use.   Electronically Signed   By: Lucrezia Europe M.D.   On: 07/24/2014 15:16   Ir US Guide Vasc Access Right  07/24/2014   CLINICAL DATA:  Acute renal failure, needs access for hemodialysis  EXAM: TUNNELED HEMODIALYSIS CATHETER PLACEMENT WITH ULTRASOUND AND FLUOROSCOPIC GUIDANCE  TECHNIQUE: The procedure, risks, benefits, and alternatives were explained to the patient. Questions regarding the procedure were encouraged and answered. The patient understands and consents to the procedure. As antibiotic prophylaxis, cefazolin 2 g was ordered pre-procedure and administered intravenously within  one hour of incision.Patency of the right IJ vein was confirmed with ultrasound with image documentation. An appropriate skin site was determined. Region was prepped using maximum barrier technique including cap and mask, sterile gown, sterile gloves, large sterile sheet, and Chlorhexidine as cutaneous antisepsis. The region was infiltrated locally with 1% lidocaine.  Intravenous Fentanyl and Versed were administered as conscious sedation during continuous cardiorespiratory monitoring by the radiology RN, with a total moderate sedation time of 10 minutes.  Under real-time ultrasound guidance, the right IJ vein was accessed with a 21 gauge micropuncture needle; the needle tip within the vein was confirmed with ultrasound image documentation. Needle exchanged over the 018 guidewire for transitional dilator, which allowed advancement of a Benson wire into the IVC. Over this, an MPA catheter was advanced. A Palindrome 19 hemodialysis catheter was tunneled from the right anterior chest wall approach to the right IJ dermatotomy site. The MPA catheter was exchanged over an Amplatz wire for serial vascular dilators which allow placement of a peel-away sheath, through which the catheter was advanced under intermittent fluoroscopy, positioned with its tips in the proximal and midright atrium. Spot chest radiograph confirms good catheter position. No pneumothorax. Catheter was flushed and primed per protocol. Catheter secured externally with O Prolene suture. The right IJ dermatotomy site was closed with Dermabond.  COMPLICATIONS: COMPLICATIONS None immediate  FLUOROSCOPY TIME:  12 seconds, 3.7 mGy  COMPARISON:  None  IMPRESSION: 1. Technically successful placement of tunneled right IJ hemodialysis catheter with ultrasound and fluoroscopic guidance. Ready for routine use.   Electronically Signed   By: Lucrezia Europe M.D.   On: 07/24/2014 15:16   Dg Chest Port 1 View  08/04/2014   CLINICAL DATA:  Shortness of breath.  EXAM:  PORTABLE CHEST - 1 VIEW  COMPARISON:  None.  FINDINGS: Tip of the right dialysis catheter in the distal SVC, projects over overlying oxygen tubing. Heart is at the upper limits of normal in size. Diffuse reticular opacities, may reflect pulmonary edema. Left lobe opacity with left pleural effusion. No pneumothorax.  IMPRESSION: 1. Left lower lobe opacity with left pleural effusion. This may reflect pneumonia with parapneumonic effusion, versus effusion with compressive atelectasis. 2. Diffuse reticular opacities, suspect pulmonary edema. Mild cardiomegaly.   Electronically Signed   By: Jeb Levering M.D.   On:  08/04/2014 05:16   Dg Shoulder Right Port  08/05/2014   CLINICAL DATA:  Pain.  EXAM: PORTABLE RIGHT SHOULDER - 2+ VIEW  COMPARISON:  07/09/2014  FINDINGS: Again noted is fracture of the right humerus, involving the greater tuberosity. No dislocation or new fracture identified. Right lung apex is clear. Right-sided dialysis catheter is present. There is degenerative change in the acromioclavicular joint.  IMPRESSION: Stable appearance of fracture involving the right greater tuberosity.   Electronically Signed   By: Nolon Nations M.D.   On: 08/05/2014 12:26   Dg Humerus Right  07/09/2014   CLINICAL DATA:  Injury to the right shoulder and humerus after fall on July 03, 2014 with pain.  EXAM: RIGHT HUMERUS - 2+ VIEW  COMPARISON:  None.  FINDINGS: There is comminuted displaced fracture of the lateral aspect of the proximal right humerus. The visualized right ribs and right lung are normal.  IMPRESSION: Fracture of lateral aspect of proximal right humerus.   Electronically Signed   By: Abelardo Diesel M.D.   On: 07/09/2014 13:58   Dg Hip Unilat With Pelvis 2-3 Views Right  07/09/2014   CLINICAL DATA:  Fall with injury to the right hip 07/03/2014. Pain and bruising. Decreased range of motion.  EXAM: RIGHT HIP (WITH PELVIS) 2-3 VIEWS  COMPARISON:  None.  FINDINGS: Right hip is located. No acute bone or soft  tissue abnormality is present. The pelvis is intact.  IMPRESSION: Negative right hip radiographs.   Electronically Signed   By: San Morelle M.D.   On: 07/09/2014 14:13    CBC  Recent Labs Lab 08/03/14 0428 08/04/14 0539 08/05/14 0912 08/06/14 0534 08/07/14 0542  WBC 8.7 13.2* 15.6* 8.4 11.7*  HGB 8.7* 10.1* 10.6* 10.5* 9.8*  HCT 26.5* 30.2* 31.7* 33.1* 30.6*  PLT 209 226 220 169 133*  MCV 90.4 89.9 89.8 90.4 92.2  MCH 29.7 30.1 30.0 28.7 29.5  MCHC 32.8 33.4 33.4 31.7 32.0  RDW 17.2* 17.3* 17.7* 17.8* 18.0*    Chemistries   Recent Labs Lab 08/03/14 0428 08/04/14 0539 08/05/14 0912 08/06/14 0534 08/07/14 0542  NA 121*  121* 120* 123* 125* 130*  K 4.0  4.0 3.7 4.1 4.3 3.7  CL 87*  87* 85* 88* 88* 95*  CO2 20*  19* 20* 20* 21* 27  GLUCOSE 90  89 93 98 137* 97  BUN 39*  39* 53* 48* 29* 16  CREATININE 7.14*  7.08* 7.99* 7.76* 5.45* 3.99*  CALCIUM 8.2*  8.2* 8.5* 8.4* 8.0* 8.4*   ------------------------------------------------------------------------------------------------------------------ estimated creatinine clearance is 12.9 mL/min (by C-G formula based on Cr of 3.99). ------------------------------------------------------------------------------------------------------------------ No results for input(s): HGBA1C in the last 72 hours. ------------------------------------------------------------------------------------------------------------------ No results for input(s): CHOL, HDL, LDLCALC, TRIG, CHOLHDL, LDLDIRECT in the last 72 hours. ------------------------------------------------------------------------------------------------------------------ No results for input(s): TSH, T4TOTAL, T3FREE, THYROIDAB in the last 72 hours.  Invalid input(s): FREET3 ------------------------------------------------------------------------------------------------------------------ No results for input(s): VITAMINB12, FOLATE, FERRITIN, TIBC, IRON, RETICCTPCT in the  last 72 hours.  Coagulation profile No results for input(s): INR, PROTIME in the last 168 hours.  No results for input(s): DDIMER in the last 72 hours.  Cardiac Enzymes No results for input(s): CKMB, TROPONINI, MYOGLOBIN in the last 168 hours.  Invalid input(s): CK ------------------------------------------------------------------------------------------------------------------ Invalid input(s): POCBNP    Finas Delone D.O. on 08/07/2014 at 5:52 PM  Between 7am to 7pm - Pager - [phone]  After 7pm go to [email or web address] - password [email or web address]  And look for the night coverage person covering for me  after hours  [facility]  [phone]

## 2014-08-08 DIAGNOSIS — E871 Hypo-osmolality and hyponatremia: Secondary | ICD-10-CM

## 2014-08-08 LAB — RENAL FUNCTION PANEL
Albumin: 3.2 g/dL — ABNORMAL LOW (ref 3.5–5.0)
Anion gap: 12 (ref 5–15)
BUN: 36 mg/dL — ABNORMAL HIGH (ref 6–20)
CO2: 25 mmol/L (ref 22–32)
Calcium: 8.2 mg/dL — ABNORMAL LOW (ref 8.9–10.3)
Chloride: 88 mmol/L — ABNORMAL LOW (ref 101–111)
Creatinine, Ser: 5.8 mg/dL — ABNORMAL HIGH (ref 0.44–1.00)
GFR calc Af Amer: 8 mL/min — ABNORMAL LOW
GFR calc non Af Amer: 7 mL/min — ABNORMAL LOW
Glucose, Bld: 136 mg/dL — ABNORMAL HIGH (ref 65–99)
Phosphorus: 2.8 mg/dL (ref 2.5–4.6)
Potassium: 3.3 mmol/L — ABNORMAL LOW (ref 3.5–5.1)
Sodium: 125 mmol/L — ABNORMAL LOW (ref 135–145)

## 2014-08-08 LAB — CBC
HCT: 28.4 % — ABNORMAL LOW (ref 36.0–46.0)
HEMOGLOBIN: 9.2 g/dL — AB (ref 12.0–15.0)
MCH: 29.6 pg (ref 26.0–34.0)
MCHC: 32.4 g/dL (ref 30.0–36.0)
MCV: 91.3 fL (ref 78.0–100.0)
Platelets: 120 10*3/uL — ABNORMAL LOW (ref 150–400)
RBC: 3.11 MIL/uL — ABNORMAL LOW (ref 3.87–5.11)
RDW: 18 % — ABNORMAL HIGH (ref 11.5–15.5)
WBC: 12.8 10*3/uL — AB (ref 4.0–10.5)

## 2014-08-08 MED ORDER — SODIUM CHLORIDE 0.9 % IV SOLN
900.0000 mg | Freq: Once | INTRAVENOUS | Status: AC
  Administered 2014-08-08: 900 mg via INTRAVENOUS
  Filled 2014-08-08: qty 90

## 2014-08-08 MED ORDER — CALCIUM ACETATE (PHOS BINDER) 667 MG PO CAPS
667.0000 mg | ORAL_CAPSULE | Freq: Every day | ORAL | Status: DC
  Administered 2014-08-08: 667 mg via ORAL
  Filled 2014-08-08 (×2): qty 1

## 2014-08-08 MED ORDER — CLONIDINE HCL 0.1 MG PO TABS: 0.1000 mg | ORAL_TABLET | Freq: Every day | ORAL | Status: DC

## 2014-08-08 MED ORDER — DARBEPOETIN ALFA 200 MCG/0.4ML IJ SOSY
PREFILLED_SYRINGE | INTRAMUSCULAR | Status: AC
  Filled 2014-08-08: qty 0.4

## 2014-08-08 NOTE — Progress Notes (Signed)
Subjective:  Feels and looks better today - on HD- due for Soliris later today  Objective Vital signs in last 24 hours: Filed Vitals:   08/08/14 0730 08/08/14 0800 08/08/14 0830 08/08/14 0900  BP: 150/83 168/75 148/93 138/93  Pulse: 81 85 77 79  Temp:      TempSrc:      Resp:      Height:      Weight:      SpO2:       Weight change:   Intake/Output Summary (Last 24 hours) at 08/08/14 0959 Last data filed at 08/07/14 2200  Gross per 24 hour  Intake    963 ml  Output    300 ml  Net    663 ml    Assessment/ Plan: Pt is a 59 y.o. yo female who was admitted on 07/17/2014 with  AKI and malignant HTN after being diagnosed with possible RA-  Assessment/Plan: 1. AKI/ESRD- was told renal function was normal in June- was 2.3 in July after being started on methotrexate but then held.  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 Tentatively planned to get first dose tomorrow.  It is given as a weekly infusion for 5 weeks and I am told to expect a response maybe in 8-12 weeks.  True that she is dialysis requiring right now and will need an OP unit to manage her ESRD for now but  I think we can hold off on the actual permanent access to see if she gets a response from this medication that can help her to recover renal function but this could take some time.  However, she understands in the interim that we will continue with HD - [facility] has a spot [facility] and can start Saturday.  I would like to watch her about 24 hours after her Soliris but if goes well discharge on Friday  2. Volume/htn- needs more aggressive volume removal - goal of 4 liters today - is  currently on norvasc/hydralazine and clonidine- with improved volume anticipate improvement- will wean clonidine- cont others for now 3. Anemia- trending up - on ESA 4. RA- on prednisone of 20 daily- to continue 5. Dispo- so once she gets her first infusion of soliris and we have an OP kidney center assigned would be able to be discharged- by end of the week ??  6. Requirements for soliris- given meningicoccal vaccine and will continue levaquin for 2 weeks s/p vaccine to cover 7. Bones- phos is getting lower- could probably do binder only with largest meal    Dontarious Schaum A    Labs: Basic Metabolic Panel:  Recent Labs Lab 08/06/14 0534 08/07/14 0542 08/08/14 0729  NA 125* 130* 125*  K 4.3 3.7 3.3*  CL 88* 95* 88*  CO2 21* 27 25  GLUCOSE 137* 97 136*  BUN 29* 16 36*  CREATININE 5.45* 3.99* 5.80*  CALCIUM 8.0* 8.4* 8.2*  PHOS 4.2 3.1 2.8   Liver Function Tests:  Recent Labs Lab 08/06/14 0534 08/07/14 0542 08/08/14 0729  ALBUMIN 3.4* 3.4* 3.2*   No results for input(s): LIPASE, AMYLASE in the last 168 hours. No results  for input(s): AMMONIA in the last 168 hours. CBC:  Recent Labs Lab 08/04/14 0539 08/05/14 0912 08/06/14 0534 08/07/14 0542 08/08/14 0746  WBC 13.2* 15.6* 8.4 11.7* 12.8*  HGB 10.1* 10.6* 10.5* 9.8* 9.2*  HCT 30.2* 31.7* 33.1* 30.6* 28.4*  MCV 89.9 89.8 90.4 92.2 91.3  PLT 226 220 169 133* 120*   Cardiac Enzymes: No results for input(s): CKTOTAL, CKMB, CKMBINDEX, TROPONINI in the last 168 hours. CBG: No results for input(s): GLUCAP in the last 168 hours.  Iron Studies: No results for input(s): IRON, TIBC, TRANSFERRIN, FERRITIN in the last 72 hours. Studies/Results: No results found. Medications: Infusions: . sodium chloride 10 mL/hr at 07/18/14 1400  . sodium chloride      Scheduled Medications: . amLODipine  10 mg Oral QHS  . bisacodyl  10 mg Rectal BID  . calcium acetate  667 mg Oral TID WC  . capsicum oleoresin   Topical  BID  . cloNIDine  0.1 mg Oral TID  . darbepoetin (ARANESP) injection - DIALYSIS  200 mcg Intravenous Q Thu-HD  . feeding supplement  1 Container Oral BID BM  . feeding supplement (NEPRO CARB STEADY)  237 mL Oral Q1500  . hydrALAZINE  50 mg Oral 3 times per day  . levofloxacin  500 mg Oral Q48H  . multivitamin  1 tablet Oral QHS  . neomycin-polymyxin b-dexamethasone  2 drop Right Eye Q12H  . NON FORMULARY 900 mg  900 mg Intravenous Weekly  . pantoprazole  20 mg Oral Daily  . predniSONE  20 mg Oral QAC breakfast  . sodium chloride  3 mL Intravenous Q12H    have reviewed scheduled and prn medications.  Physical Exam: General: looks better Heart: RRR Lungs: dec BS at bases Abdomen: distended Extremities: pitting edema- but better Dialysis Access: PC placed 7/20   08/08/2014,9:59 AM  LOS: 22 days

## 2014-08-08 NOTE — Care Management Note (Signed)
Case Management Note  Patient Details  Name: Tahani Latulippe MRN: [id number] Date of Birth: [birth year]  Subjective/Objective:      CM following for progression and d/c planning              Action/Plan: Ongoing efforts to arrange outpatient adm of Soliris 900mg  once per week x 4 doses beginning Wed, Aug 14, 2014. [facility] short stay is unable to schedule due to full schedule, Cancer center can only adm if pt is being followed by oncology or hemotology. [facility] can not use HD cath to adm however they will provide the service . We have learned from the drug rep that this can be adm peripherally. Dr Otis Peak will complete the short stay form tomorrow to sent to that department.   Expected Discharge Date:       08/08/2014           Expected Discharge Plan:  Home/Self Care  In-House Referral:  NA  Discharge planning Services  CM Consult  Post Acute Care Choice:    Choice offered to:     DME Arranged:    DME Agency:     HH Arranged:    HH Agency:     Status of Service:  In process, will continue to follow  Medicare Important Message Given:    Date Medicare IM Given:    Medicare IM give by:    Date Additional Medicare IM Given:    Additional Medicare Important Message give by:     If discussed at [place] of Stay Meetings, dates discussed:    Additional Comments:  Adron Bene, RN 08/08/2014, 3:45 PM

## 2014-08-08 NOTE — Progress Notes (Signed)
[facility]                                                                              Patient Demographics  Erin Morgan, is a 59 y.o. female, DOB - [birth year], [id number]  Admit date - 07/17/2014   Admitting Physician Toy Baker, MD  Outpatient Primary MD for the patient is Nicola Girt, DO  LOS - 21   Chief Complaint  Patient presents with  . Headache  . Nausea  . Hypertension      Interim history 59 year old female with history of rheumatoid arthritis and osteopenia presented with 3-4 months of edema and generalized joint pain. Patient was recently started on methotrexate and prednisone. Patient did have worsening creatinine and anemia was admitted. Patient since admission has required dialysis. Felt to be secondary to questionable atypical HUS versus malignant hypertension. Disposition per nephrology.  Assessment & Plan   Acute progressive renal failure/?ESRD - renal biopsy with thrombotic microangiopathy, ? TTP (ADAMTS13 deficiency), HUS, immune mediated or toxin mediated - ? malignant HTN - Nephrology consulted and currently assisting with management  - Patient dialyzed 08/05/2014 with 3400cc removed, and is dialyzing today. - Nephrology feels this could be atypical HUS and will start patient on Soliris. Patient was given meningococcal vaccine and will continue Levaquin for 2 weeks status post vaccine to cover. - Will defer vascular consult for now  HCAP with leukocytosis - Overnight, patient became dyspneic, without tachycardia or tachypnea - VQ scan ordered and showed no pulmonary embolism, was normal - Chest x-ray: Left lower lobe opacity with left pleural effusion - Continue levaquin (will need for 2 weeks with treatment of soliris) - Patient also received meningococcal vaccination  Nausea and vomiting - continue antiemetics PRN - continue to provide senna and add bisacodyl supp   Rheumatoid arthritis  - Continue prednisone 20 mg  daily  - holding MTX  - appears reasonably stable at present  - no focal joint complaints  Acute encephalopathy - resolved   Normocytic anemia - Iron studies suggest iron deficiency  - dosed with Feraheme 07/20/14 - Hg remains overall stable with no signs of active bleeding, currently 9.8  Hypervolemic Hyponatremia - Due to acute renal failure - improving  Hypokalemia - WNL this AM - daily renal panel   Uncontrolled hypertension/ urgency - currently on Norvasc, Hydralazine, Clonidine  - Patient had been on hydralazine prior to admission > anti-histone antibody negative - Echocardiogram notes no impairment in systolic fxn or WMA - Suspect BP will improve with dialysis today  Persistent waxing/waning HA  - Continue pain control as needed - orthostatic vitals negative   Inadequate oral intake in the setting of acute illness  - nutritionist consulted, recommendations appreciated   Right shoulder pain/fracture - Improved, continue capsicum cream and heating pad - X-ray showed stable appearance of fracture involving the right greater tuberosity  Code Status: Full  Family Communication: Parents at bedside  Disposition Plan: Admitted. Per nephrology.  Time Spent in minutes   35 minutes  Procedures  Renal Bx VQ scan  Consults   Nephrology  DVT Prophylaxis  SCDs  Lab Results  Component Value Date   PLT 120* 08/08/2014  Medications  Scheduled Meds: . amLODipine  10 mg Oral QHS  . bisacodyl  10 mg Rectal BID  . calcium acetate  667 mg Oral Q supper  . capsicum oleoresin   Topical BID  . [START ON 08/09/2014] cloNIDine  0.1 mg Oral QHS  . Darbepoetin Alfa      . darbepoetin (ARANESP) injection - DIALYSIS  200 mcg Intravenous Q Thu-HD  . feeding supplement  1 Container Oral BID BM  . feeding supplement (NEPRO CARB STEADY)  237 mL Oral Q1500  . hydrALAZINE  50 mg Oral 3 times per day  . levofloxacin  500 mg Oral Q48H  . multivitamin  1 tablet Oral QHS   . neomycin-polymyxin b-dexamethasone  2 drop Right Eye Q12H  . pantoprazole  20 mg Oral Daily  . predniSONE  20 mg Oral QAC breakfast  . sodium chloride  3 mL Intravenous Q12H   Continuous Infusions: . sodium chloride 10 mL/hr at 07/18/14 1400  . sodium chloride     PRN Meds:.acetaminophen, butalbital-acetaminophen-caffeine, hydrALAZINE, HYDROcodone-acetaminophen, morphine injection, ondansetron **OR** ondansetron (ZOFRAN) IV, oxyCODONE-acetaminophen, prochlorperazine, promethazine  Antibiotics    Anti-infectives    Start     Dose/Rate Route Frequency Ordered Stop   08/06/14 1000  levofloxacin (LEVAQUIN) tablet 500 mg     500 mg Oral Every 48 hours 08/04/14 1100 08/19/14 2359   08/04/14 1130  levofloxacin (LEVAQUIN) tablet 750 mg     750 mg Oral  Once 08/04/14 1100 08/04/14 1108   07/24/14 1000  ceFAZolin (ANCEF) IVPB 2 g/50 mL premix    Comments:  Hang ON CALL to xray 7/20   2 g 100 mL/hr over 30 Minutes Intravenous On call 07/24/14 0908 07/24/14 1503      Subjective:   [name] has no new complaints.  Objective:   Filed Vitals:   08/08/14 1030 08/08/14 1100 08/08/14 1126 08/08/14 1554  BP: 166/83 161/92 169/92 166/87  Pulse: 84 73 85   Temp:   98.5 F (36.9 C)   TempSrc:   Oral   Resp:   18   Height:      Weight:      SpO2:        Wt Readings from Last 3 Encounters:  08/08/14 59.4 kg (130 lb 15.3 oz)  04/05/14 62.143 kg (137 lb)  05/21/12 59.875 kg (132 lb)     Intake/Output Summary (Last 24 hours) at 08/08/14 1753 Last data filed at 08/08/14 1126  Gross per 24 hour  Intake    483 ml  Output   3900 ml  Net  -3417 ml    Exam  General: Well developed, well nourished, no distress  HEENT: NCAT, mucous membranes moist.   Cardiovascular: S1 S2 auscultated, RRR, no murmurs  Respiratory:  No increased wob, no wheezes, equal chest rise.  Abdomen: Soft, nontender, nondistended, + bowel sounds  Extremities: warm dry without cyanosis  clubbing. Trace edema  Data Review   Micro Results No results found for this or any previous visit (from the past 240 hour(s)).  Radiology Reports Ct Abdomen Pelvis Wo Contrast  08/02/2014   CLINICAL DATA:  Acute renal failure.  Headache and nausea  EXAM: CT ABDOMEN AND PELVIS WITHOUT CONTRAST  TECHNIQUE: Multidetector CT imaging of the abdomen and pelvis was performed following the standard protocol without IV contrast.  COMPARISON:  None.  FINDINGS: Lower chest: Bilateral pleural effusions and basilar atelectasis.  Hepatobiliary: No focal hepatic lesion. No biliary duct dilatation. Gallbladder is normal.  Common bile duct is normal.  Pancreas: Pancreas is normal. No ductal dilatation. No pancreatic inflammation.  Spleen: Scattered granulomata within the spleen liver.  Adrenals/urinary tract: Adrenal glands are normal. No hydronephrosis. There is a intermediate density lesion extending from the posterior mid aspect of the LEFT kidney measuring 16 mm on image 28, series 2.  Stomach/Bowel: Stomach, small bowel are normal. Normal appendix is seen on image 30, series 5. There is inflammation within the fat surrounding the cecum and to lesser degree the ascending colon. There is a serosal inflammation along the cecum. The more distal. Transverse and descending colon are normal. The rectum is normal.  Vascular/Lymphatic: Abdominal aorta is normal caliber. There is no retroperitoneal or periportal lymphadenopathy. No pelvic lymphadenopathy.  Reproductive: Uterus and ovaries are normal.  Musculoskeletal: No aggressive osseous lesion.  Other: No free fluid.  IMPRESSION: 1. Inflammation primary involving the cecum. Differential would include ischemic colitis, infectious colitis and inflammatory bowel disease. 2. Indeterminate lesion extending posterior to the LEFT kidney. If the patient cannot receive IV contrast recommend ultrasound and potentially MRI for further evaluation.   Electronically Signed   By: Suzy Bouchard M.D.   On: 08/02/2014 18:48   Ct Head Wo Contrast  07/17/2014   CLINICAL DATA:  59 year old female with headache, nausea and leukocytosis  EXAM: CT HEAD WITHOUT CONTRAST  TECHNIQUE: Contiguous axial images were obtained from the base of the skull through the vertex without intravenous contrast.  COMPARISON:  None.  FINDINGS: Negative for acute intracranial hemorrhage, acute infarction, mass, mass effect, hydrocephalus or midline shift. Gray-white differentiation is preserved throughout. No acute soft tissue or calvarial abnormality. The globes and orbits are symmetric and unremarkable. Normal aeration of the mastoid air cells and visualized paranasal sinuses. Small sclerotic focus in the right frontal calvarium just above the orbit likely represents a benign bone island.  IMPRESSION: Negative head CT.   Electronically Signed   By: Jacqulynn Cadet M.D.   On: 07/17/2014 17:04   Mr Brain Wo Contrast  07/18/2014   CLINICAL DATA:  Initial evaluation for acute headache.  EXAM: MRI HEAD WITHOUT CONTRAST  TECHNIQUE: Multiplanar, multiecho pulse sequences of the brain and surrounding structures were obtained without intravenous contrast.  COMPARISON:  Prior CT from 07/17/2014  FINDINGS: The CSF containing spaces are within normal limits for patient age. Few scattered T2/FLAIR hyperintense foci present within the periventricular and deep white matter of both cerebral hemispheres, nonspecific. No mass lesion, midline shift, or extra-axial fluid collection. Ventricles are normal in size without evidence of hydrocephalus.  No diffusion-weighted signal abnormality is identified to suggest acute intracranial infarct. Gray-white matter differentiation is maintained. Normal flow voids are seen within the intracranial vasculature. No intracranial hemorrhage identified.  The cervicomedullary junction is normal. Pituitary gland is within normal limits. Pituitary stalk is midline. The globes and optic nerves demonstrate  a normal appearance with normal signal intensity. The  The bone marrow signal intensity is normal. Calvarium is intact. Visualized upper cervical spine is within normal limits.  Scalp soft tissues are unremarkable.  Paranasal sinuses are clear.  No mastoid effusion.  IMPRESSION: 1. No acute intracranial process identified. 2. Few scattered subcentimeter T2/FLAIR height matter foci within the supratentorial white matter. These foci are nonspecific, but may be related to very mild chronic small vessel ischemic changes. These are felt to be within normal limits for patient age. 3. Otherwise normal brain MRI   Electronically Signed   By: Jeannine Boga M.D.   On: 07/18/2014  06:14   US Renal  07/18/2014   CLINICAL DATA:  Acute renal failure.  EXAM: RENAL / URINARY TRACT ULTRASOUND COMPLETE  COMPARISON:  None.  FINDINGS: Right Kidney:  Length: 10.0 cm. Echogenicity within normal limits. No mass or hydronephrosis visualized.  Left Kidney:  Length: 9.4 cm. Echogenicity within normal limits. No mass or hydronephrosis visualized.  Bladder:  Appears normal for degree of bladder distention.  Small bilateral pleural effusions incidentally noted.  IMPRESSION: No evidence of hydronephrosis or other renal abnormality.  Small bilateral pleural effusions incidentally noted.   Electronically Signed   By: Earle Gell M.D.   On: 07/18/2014 11:55   Nm Pulmonary Perf And Vent  08/04/2014   CLINICAL DATA:  Acute shortness of breath.  EXAM: NUCLEAR MEDICINE VENTILATION - PERFUSION LUNG SCAN  TECHNIQUE: Ventilation images were obtained in multiple projections using inhaled aerosol Tc-59m DTPA. Perfusion images were obtained in multiple projections after intravenous injection of Tc-21m MAA.  RADIOPHARMACEUTICALS:  38.5 Technetium-75m DTPA aerosol inhalation and 5.9 Technetium-4m MAA IV  COMPARISON:  08/04/2014 chest radiograph  FINDINGS: Ventilation: No focal ventilation defect.  Perfusion: No wedge shaped peripheral perfusion  defects to suggest acute pulmonary embolism.  IMPRESSION: Normal exam.   Electronically Signed   By: Margarette Canada M.D.   On: 08/04/2014 07:05   US Biopsy  07/23/2014   CLINICAL DATA:  Renal insufficiency  EXAM: ULTRASOUND BIOPSY  COMPARISON:  None.  FINDINGS: Imaging was provided to the Nephrology service for renal biopsy.  IMPRESSION: See above.   Electronically Signed   By: Marybelle Killings M.D.   On: 07/23/2014 13:26   Ir Fluoro Guide Cv Line Right  07/24/2014   CLINICAL DATA:  Acute renal failure, needs access for hemodialysis  EXAM: TUNNELED HEMODIALYSIS CATHETER PLACEMENT WITH ULTRASOUND AND FLUOROSCOPIC GUIDANCE  TECHNIQUE: The procedure, risks, benefits, and alternatives were explained to the patient. Questions regarding the procedure were encouraged and answered. The patient understands and consents to the procedure. As antibiotic prophylaxis, cefazolin 2 g was ordered pre-procedure and administered intravenously within one hour of incision.Patency of the right IJ vein was confirmed with ultrasound with image documentation. An appropriate skin site was determined. Region was prepped using maximum barrier technique including cap and mask, sterile gown, sterile gloves, large sterile sheet, and Chlorhexidine as cutaneous antisepsis. The region was infiltrated locally with 1% lidocaine.  Intravenous Fentanyl and Versed were administered as conscious sedation during continuous cardiorespiratory monitoring by the radiology RN, with a total moderate sedation time of 10 minutes.  Under real-time ultrasound guidance, the right IJ vein was accessed with a 21 gauge micropuncture needle; the needle tip within the vein was confirmed with ultrasound image documentation. Needle exchanged over the 018 guidewire for transitional dilator, which allowed advancement of a Benson wire into the IVC. Over this, an MPA catheter was advanced. A Palindrome 19 hemodialysis catheter was tunneled from the right anterior chest wall  approach to the right IJ dermatotomy site. The MPA catheter was exchanged over an Amplatz wire for serial vascular dilators which allow placement of a peel-away sheath, through which the catheter was advanced under intermittent fluoroscopy, positioned with its tips in the proximal and midright atrium. Spot chest radiograph confirms good catheter position. No pneumothorax. Catheter was flushed and primed per protocol. Catheter secured externally with O Prolene suture. The right IJ dermatotomy site was closed with Dermabond.  COMPLICATIONS: COMPLICATIONS None immediate  FLUOROSCOPY TIME:  12 seconds, 3.7 mGy  COMPARISON:  None  IMPRESSION: 1. Technically  successful placement of tunneled right IJ hemodialysis catheter with ultrasound and fluoroscopic guidance. Ready for routine use.   Electronically Signed   By: Lucrezia Europe M.D.   On: 07/24/2014 15:16   Ir US Guide Vasc Access Right  07/24/2014   CLINICAL DATA:  Acute renal failure, needs access for hemodialysis  EXAM: TUNNELED HEMODIALYSIS CATHETER PLACEMENT WITH ULTRASOUND AND FLUOROSCOPIC GUIDANCE  TECHNIQUE: The procedure, risks, benefits, and alternatives were explained to the patient. Questions regarding the procedure were encouraged and answered. The patient understands and consents to the procedure. As antibiotic prophylaxis, cefazolin 2 g was ordered pre-procedure and administered intravenously within one hour of incision.Patency of the right IJ vein was confirmed with ultrasound with image documentation. An appropriate skin site was determined. Region was prepped using maximum barrier technique including cap and mask, sterile gown, sterile gloves, large sterile sheet, and Chlorhexidine as cutaneous antisepsis. The region was infiltrated locally with 1% lidocaine.  Intravenous Fentanyl and Versed were administered as conscious sedation during continuous cardiorespiratory monitoring by the radiology RN, with a total moderate sedation time of 10 minutes.   Under real-time ultrasound guidance, the right IJ vein was accessed with a 21 gauge micropuncture needle; the needle tip within the vein was confirmed with ultrasound image documentation. Needle exchanged over the 018 guidewire for transitional dilator, which allowed advancement of a Benson wire into the IVC. Over this, an MPA catheter was advanced. A Palindrome 19 hemodialysis catheter was tunneled from the right anterior chest wall approach to the right IJ dermatotomy site. The MPA catheter was exchanged over an Amplatz wire for serial vascular dilators which allow placement of a peel-away sheath, through which the catheter was advanced under intermittent fluoroscopy, positioned with its tips in the proximal and midright atrium. Spot chest radiograph confirms good catheter position. No pneumothorax. Catheter was flushed and primed per protocol. Catheter secured externally with O Prolene suture. The right IJ dermatotomy site was closed with Dermabond.  COMPLICATIONS: COMPLICATIONS None immediate  FLUOROSCOPY TIME:  12 seconds, 3.7 mGy  COMPARISON:  None  IMPRESSION: 1. Technically successful placement of tunneled right IJ hemodialysis catheter with ultrasound and fluoroscopic guidance. Ready for routine use.   Electronically Signed   By: Lucrezia Europe M.D.   On: 07/24/2014 15:16   Dg Chest Port 1 View  08/04/2014   CLINICAL DATA:  Shortness of breath.  EXAM: PORTABLE CHEST - 1 VIEW  COMPARISON:  None.  FINDINGS: Tip of the right dialysis catheter in the distal SVC, projects over overlying oxygen tubing. Heart is at the upper limits of normal in size. Diffuse reticular opacities, may reflect pulmonary edema. Left lobe opacity with left pleural effusion. No pneumothorax.  IMPRESSION: 1. Left lower lobe opacity with left pleural effusion. This may reflect pneumonia with parapneumonic effusion, versus effusion with compressive atelectasis. 2. Diffuse reticular opacities, suspect pulmonary edema. Mild cardiomegaly.    Electronically Signed   By: Jeb Levering M.D.   On: 08/04/2014 05:16   Dg Shoulder Right Port  08/05/2014   CLINICAL DATA:  Pain.  EXAM: PORTABLE RIGHT SHOULDER - 2+ VIEW  COMPARISON:  07/09/2014  FINDINGS: Again noted is fracture of the right humerus, involving the greater tuberosity. No dislocation or new fracture identified. Right lung apex is clear. Right-sided dialysis catheter is present. There is degenerative change in the acromioclavicular joint.  IMPRESSION: Stable appearance of fracture involving the right greater tuberosity.   Electronically Signed   By: Nolon Nations M.D.   On: 08/05/2014 12:26  CBC  Recent Labs Lab 08/04/14 0539 08/05/14 0912 08/06/14 0534 08/07/14 0542 08/08/14 0746  WBC 13.2* 15.6* 8.4 11.7* 12.8*  HGB 10.1* 10.6* 10.5* 9.8* 9.2*  HCT 30.2* 31.7* 33.1* 30.6* 28.4*  PLT 226 220 169 133* 120*  MCV 89.9 89.8 90.4 92.2 91.3  MCH 30.1 30.0 28.7 29.5 29.6  MCHC 33.4 33.4 31.7 32.0 32.4  RDW 17.3* 17.7* 17.8* 18.0* 18.0*    Chemistries   Recent Labs Lab 08/04/14 0539 08/05/14 0912 08/06/14 0534 08/07/14 0542 08/08/14 0729  NA 120* 123* 125* 130* 125*  K 3.7 4.1 4.3 3.7 3.3*  CL 85* 88* 88* 95* 88*  CO2 20* 20* 21* 27 25  GLUCOSE 93 98 137* 97 136*  BUN 53* 48* 29* 16 36*  CREATININE 7.99* 7.76* 5.45* 3.99* 5.80*  CALCIUM 8.5* 8.4* 8.0* 8.4* 8.2*   ------------------------------------------------------------------------------------------------------------------ estimated creatinine clearance is 8.9 mL/min (by C-G formula based on Cr of 5.8). ------------------------------------------------------------------------------------------------------------------ No results for input(s): HGBA1C in the last 72 hours. ------------------------------------------------------------------------------------------------------------------ No results for input(s): CHOL, HDL, LDLCALC, TRIG, CHOLHDL, LDLDIRECT in the last 72  hours. ------------------------------------------------------------------------------------------------------------------ No results for input(s): TSH, T4TOTAL, T3FREE, THYROIDAB in the last 72 hours.  Invalid input(s): FREET3 ------------------------------------------------------------------------------------------------------------------ No results for input(s): VITAMINB12, FOLATE, FERRITIN, TIBC, IRON, RETICCTPCT in the last 72 hours.  Coagulation profile No results for input(s): INR, PROTIME in the last 168 hours.  No results for input(s): DDIMER in the last 72 hours.  Cardiac Enzymes No results for input(s): CKMB, TROPONINI, MYOGLOBIN in the last 168 hours.  Invalid input(s): CK ------------------------------------------------------------------------------------------------------------------ Invalid input(s): POCBNP    Raivyn Kabler D.O. on 08/08/2014 at 5:53 PM  Between 7am to 7pm - Pager - [phone]  After 7pm go to [email or web address] - password [email or web address]  And look for the night coverage person covering for me after hours  [facility] Office  [phone]

## 2014-08-08 NOTE — Plan of Care (Signed)
Problem: Food- and Nutrition-Related Knowledge Deficit (NB-1.1) Goal: Nutrition education Formal process to instruct or train a patient/client in a skill or to impart knowledge to help patients/clients voluntarily manage or modify food choices and eating behavior to maintain or improve health.  Outcome: Completed/Met Date Met:  08/08/14 Nutrition Education Note  RD consulted for Renal Education. Provided "Eating Healthy with Kidney Disease" handout to patient/family. Reviewed food groups and provided written recommended serving sizes specifically determined for patient's current nutritional status.   Explained why diet restrictions are needed and provided lists of foods to limit/avoid that are high potassium, sodium, and phosphorus. Provided specific recommendations on safer alternatives of these foods. Strongly encouraged compliance of this diet.   Discussed importance of protein intake at each meal and snack. Provided examples of how to maximize protein intake throughout the day. Discussed need for fluid restriction with dialysis and renal-friendly beverage options.  Encouraged pt to discuss specific diet questions/concerns with RD at HD outpatient facility. Teach back method used.  Expect good compliance.  Corrin Parker, MS, RD, LDN Pager # [phone] After hours/ weekend pager # [phone]

## 2014-08-08 NOTE — Progress Notes (Signed)
Patient refused labs and wants her labs to be drawn at hemodialysis.

## 2014-08-08 NOTE — Procedures (Signed)
Patient was seen on dialysis and the procedure was supervised.  BFR 350  Via PC BP is  138/93.   Patient appears to be tolerating treatment well  Willistine Ferrall A 08/08/2014

## 2014-08-09 DIAGNOSIS — M069 Rheumatoid arthritis, unspecified: Secondary | ICD-10-CM

## 2014-08-09 DIAGNOSIS — Z1589 Genetic susceptibility to other disease: Secondary | ICD-10-CM

## 2014-08-09 DIAGNOSIS — D593 Hemolytic-uremic syndrome: Secondary | ICD-10-CM

## 2014-08-09 DIAGNOSIS — N19 Unspecified kidney failure: Secondary | ICD-10-CM

## 2014-08-09 DIAGNOSIS — D5939 Other hemolytic-uremic syndrome: Secondary | ICD-10-CM

## 2014-08-09 LAB — RENAL FUNCTION PANEL
ANION GAP: 9 (ref 5–15)
Albumin: 3.5 g/dL (ref 3.5–5.0)
BUN: 18 mg/dL (ref 6–20)
CHLORIDE: 96 mmol/L — AB (ref 101–111)
CO2: 24 mmol/L (ref 22–32)
Calcium: 8.5 mg/dL — ABNORMAL LOW (ref 8.9–10.3)
Creatinine, Ser: 3.79 mg/dL — ABNORMAL HIGH (ref 0.44–1.00)
GFR calc non Af Amer: 12 mL/min — ABNORMAL LOW (ref >60)
GFR, EST AFRICAN AMERICAN: 14 mL/min — AB (ref >60)
Glucose, Bld: 78 mg/dL (ref 65–99)
Phosphorus: 2.2 mg/dL — ABNORMAL LOW (ref 2.5–4.6)
Potassium: 4.4 mmol/L (ref 3.5–5.1)
SODIUM: 129 mmol/L — AB (ref 135–145)

## 2014-08-09 MED ORDER — WITCH HAZEL-GLYCERIN EX PADS
MEDICATED_PAD | CUTANEOUS | Status: DC | PRN
  Filled 2014-08-09: qty 100

## 2014-08-09 MED ORDER — CLONIDINE HCL 0.1 MG PO TABS
0.1000 mg | ORAL_TABLET | Freq: Once | ORAL | Status: AC
Start: 2014-08-09 — End: 2014-08-09
  Administered 2014-08-09: 0.1 mg via ORAL
  Filled 2014-08-09: qty 1

## 2014-08-09 MED ORDER — LEVOFLOXACIN 500 MG PO TABS: 500.0000 mg | ORAL_TABLET | ORAL | Status: DC

## 2014-08-09 MED ORDER — CLONIDINE HCL 0.1 MG PO TABS: 0.1000 mg | ORAL_TABLET | Freq: Three times a day (TID) | ORAL | Status: DC

## 2014-08-09 MED ORDER — HYDRALAZINE HCL 50 MG PO TABS: 50.0000 mg | ORAL_TABLET | Freq: Three times a day (TID) | ORAL | Status: DC

## 2014-08-09 MED ORDER — RENA-VITE PO TABS: 1.0000 | ORAL_TABLET | Freq: Every day | ORAL | Status: DC

## 2014-08-09 MED ORDER — AMLODIPINE BESYLATE 10 MG PO TABS: 10.0000 mg | ORAL_TABLET | Freq: Every day | ORAL | Status: DC

## 2014-08-09 MED ORDER — CALCIUM ACETATE (PHOS BINDER) 667 MG PO CAPS: 667.0000 mg | ORAL_CAPSULE | Freq: Every day | ORAL | Status: DC

## 2014-08-09 MED ORDER — HEPARIN SODIUM (PORCINE) 1000 UNIT/ML DIALYSIS: 20.0000 [IU]/kg | INTRAMUSCULAR | Status: DC | PRN

## 2014-08-09 MED ORDER — NEPRO/CARBSTEADY PO LIQD: 237.0000 mL | Freq: Every day | ORAL | Status: DC

## 2014-08-09 NOTE — Care Management Note (Signed)
Case Management Note  Patient Details  Name: Erin Morgan MRN: [id number] Date of Birth: 1955/08/09  Subjective/Objective:               CM continuing to follow and assist with d/c plan.     Action/Plan: 08/08/2014 mulitiple attempts to schedule outpatient adm of Saliris, as of this am 08/09/2014 this pt will receive this drug as prescribed by Dr Otis Peak at [place] beginning Wed, Aug 14, 2014 and will continue weekly 900mg  for 3 doses then increase to 1200mg  for the next 3 weeks , after that the medication will be adm q2weeks. All [facility] . This was discussed with pharmacy, Teressa Lower.   Expected Discharge Date:      08/10/2014            Expected Discharge Plan:  Home/Self Care  In-House Referral:  NA  Discharge planning Services  CM Consult  Post Acute Care Choice:    Choice offered to:     DME Arranged:    DME Agency:     HH Arranged:    HH Agency:     Status of Service:  In process, will continue to follow  Medicare Important Message Given:    Date Medicare IM Given:    Medicare IM give by:    Date Additional Medicare IM Given:    Additional Medicare Important Message give by:     If discussed at [place] of Stay Meetings, dates discussed:    Additional Comments:  Adron Bene, RN 08/09/2014, 11:00 AM

## 2014-08-09 NOTE — Procedures (Signed)
Patient was seen on dialysis and the procedure was supervised.  BFR 400  Via PC BP is  161/93.   Patient appears to be tolerating treatment well  Brick Ketcher A 08/09/2014

## 2014-08-09 NOTE — Progress Notes (Signed)
Subjective:  S/p soliris- went fine- had bad night- BP up and swollen- had 3900 removed with HD yest  Objective Vital signs in last 24 hours: Filed Vitals:   08/08/14 2048 08/09/14 0518 08/09/14 0649 08/09/14 0801  BP:  175/85 169/85 163/77  Pulse: 81 99  104  Temp: 98.1 F (36.7 C) 98.7 F (37.1 C)  98.4 F (36.9 C)  TempSrc: Oral Oral  Oral  Resp: 16 14  15   Height:      Weight:      SpO2: 100% 99%  99%   Weight change:   Intake/Output Summary (Last 24 hours) at 08/09/14 0958 Last data filed at 08/09/14 0846  Gross per 24 hour  Intake    600 ml  Output   4200 ml  Net  -3600 ml    Assessment/ Plan: Pt is a 59 y.o. yo female who was admitted on 07/17/2014 with  AKI and malignant HTN after being diagnosed with possible RA-  Assessment/Plan: 1. AKI/ESRD- renal function was normal in June- was 2.3 in July after being started on methotrexate but then held.  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  - her ADAMts activity was 54- her complements were low normal and she seemed to have evidence of hemolysis with low haptoglobin and elevated LDH-I have discussed her case with a colleague who has experience in this arena and he feels like this could be consistent with atypical HUS which has a treatment available- Soliris. Got first dose 8/4.  It is given as a weekly infusion for 5 weeks and I am told to expect a response maybe in 8-12 weeks.  True that she is dialysis requiring right now and will need an OP unit to manage her ESRD for now but  I think we can hold off on the actual permanent access to see if she gets a response from this medication that can help her to recover renal function but this could take some time.  However, she understands in the interim that we will continue with HD - [facility] spot [facility] and can start tomorrow. OK for discharge today- she would feel more comfortable with a little HD today for volume before leaving later to start OP dialysis  tomorrow  2. Volume/htn- needs more aggressive volume removal - goal of 4 liters yest - is currently on norvasc/hydralazine and clonidine- I think I got too aggressive in weaning clonidine yest- will resume 3 times a day and continue with volume removal with HD 3. Anemia- was trending up - on ESA- now decreasing along with plts a little 4. RA- on prednisone of 20 daily- to continue 5. Dispo- still OK for discharge today and to go to OP center tomorrow 6. Requirements for soliris- given meningicoccal vaccine and will continue levaquin for 2 weeks after  vaccine to cover 7. Bones- phos is getting lower- could probably do binder only with largest meal    Gerrod Maule A    Labs: Basic Metabolic Panel:  Recent Labs Lab 08/07/14 0542 08/08/14 0729 08/09/14 0522  NA 130* 125* 129*  K 3.7 3.3* 4.4  CL 95* 88* 96*  CO2 27 25 24   GLUCOSE 97 136* 78  BUN 16 36* 18  CREATININE 3.99* 5.80* 3.79*  CALCIUM 8.4* 8.2* 8.5*  PHOS 3.1 2.8 2.2*   Liver Function Tests:  Recent Labs Lab 08/07/14 0542 08/08/14 0729 08/09/14 0522  ALBUMIN 3.4* 3.2* 3.5   No results for input(s): LIPASE,  AMYLASE in the last 168 hours. No results for input(s): AMMONIA in the last 168 hours. CBC:  Recent Labs Lab 08/04/14 0539 08/05/14 0912 08/06/14 0534 08/07/14 0542 08/08/14 0746  WBC 13.2* 15.6* 8.4 11.7* 12.8*  HGB 10.1* 10.6* 10.5* 9.8* 9.2*  HCT 30.2* 31.7* 33.1* 30.6* 28.4*  MCV 89.9 89.8 90.4 92.2 91.3  PLT 226 220 169 133* 120*   Cardiac Enzymes: No results for input(s): CKTOTAL, CKMB, CKMBINDEX, TROPONINI in the last 168 hours. CBG: No results for input(s): GLUCAP in the last 168 hours.  Iron Studies: No results for input(s): IRON, TIBC, TRANSFERRIN, FERRITIN in the last 72 hours. Studies/Results: No results found. Medications: Infusions: . sodium chloride 10 mL/hr at 07/18/14 1400  . sodium chloride      Scheduled Medications: . amLODipine  10 mg Oral QHS  . bisacodyl   10 mg Rectal BID  . calcium acetate  667 mg Oral Q supper  . capsicum oleoresin   Topical BID  . cloNIDine  0.1 mg Oral Once  . cloNIDine  0.1 mg Oral TID  . darbepoetin (ARANESP) injection - DIALYSIS  200 mcg Intravenous Q Thu-HD  . feeding supplement  1 Container Oral BID BM  . feeding supplement (NEPRO CARB STEADY)  237 mL Oral Q1500  . hydrALAZINE  50 mg Oral 3 times per day  . levofloxacin  500 mg Oral Q48H  . multivitamin  1 tablet Oral QHS  . neomycin-polymyxin b-dexamethasone  2 drop Right Eye Q12H  . pantoprazole  20 mg Oral Daily  . predniSONE  20 mg Oral QAC breakfast  . sodium chloride  3 mL Intravenous Q12H    have reviewed scheduled and prn medications.  Physical Exam: General: looks a little worse today  Heart: RRR Lungs: dec BS at bases Abdomen: distended Extremities: pitting edema- but better Dialysis Access: PC placed 7/20   08/09/2014,9:58 AM  LOS: 23 days

## 2014-08-09 NOTE — Discharge Summary (Signed)
Physician Discharge Summary  Erin Morgan [id number] DOB: October 01, 1955 DOA: 07/17/2014  PCP: Doug Sou B, DO  Admit date: 07/17/2014 Discharge date: 08/09/2014  Time spent: > 35 minutes  Recommendations for Outpatient Follow-up:  1. Monitor serum creatinine 2. Monitor and adjust antihypertensive medications.  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  Hospital Course:  Acute progressive RF/ ?ESRD - Nephrology managed while in house and recommended the following: 1. AKI/ESRD- renal function was normal in June- was 2.3 in July after being started on methotrexate but then held. 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 - her ADAMts activity was 54- her complements were low normal and she seemed to have evidence of hemolysis with low haptoglobin and elevated LDH-I have discussed her case with a colleague who has experience in this arena and he feels like this could be consistent with atypical HUS which has a treatment available- Soliris. Got first dose 8/4. It is given as a weekly infusion for 5 weeks and I am told to expect a response maybe in 8-12 weeks. True that she is dialysis requiring right now and will need an OP unit to manage her ESRD for now but I think we can hold off on the actual permanent access to see if she gets a response from this medication that can help her to recover renal function but this could take  some time. However, she understands in the interim that we will continue with HD - [facility] and can start tomorrow. OK for discharge today- she would feel more comfortable with a little HD today for volume before leaving later to start OP dialysis tomorrow  2. Volume/htn- needs more aggressive volume removal - goal of 4 liters yest - is currently on norvasc/hydralazine and clonidine- I think I got too aggressive in weaning clonidine yest- will resume 3 times a day and continue with volume removal with HD 3. Anemia- was trending up - on ESA- now decreasing along with plts a little 4. RA- on prednisone of 20 daily- to continue 5. Dispo- still OK for discharge today and to go to OP center tomorrow 6. Requirements for soliris- given meningicoccal vaccine and will continue levaquin for 2 weeks after vaccine to cover 7. Bones- phos is getting lower- could probably do binder only with largest meal  Agree with above and prescriptions provided on d/c  Procedures:  HD while in house  Consultations:  Nephrology  Discharge Exam: Filed Vitals:   08/09/14 1230  BP: 133/82  Pulse: 102  Temp:   Resp:     General: Pt in nad,alert and awake Cardiovascular: rrr, no mrg Respiratory: cta bl, no wheezes  Discharge Instructions   Discharge Instructions    Call MD for:  difficulty breathing, headache or visual disturbances    Complete by:  As directed      Call MD for:  extreme fatigue    Complete by:  As directed      Call MD for:  temperature >100.4    Complete by:  As directed      Diet - low sodium heart healthy    Complete by:  As directed      Discharge instructions    Complete by:  As directed   Please follow up with the nephrologist as per their recommendations based on the discussions they have had with you while in the hospital.     Increase activity slowly    Complete by:  As directed           Current Discharge Medication List    START taking these  medications   Details  amLODipine (NORVASC) 10 MG tablet Take 1 tablet (10 mg total) by mouth at bedtime. Qty: 30 tablet, Refills: 0    calcium acetate (PHOSLO) 667 MG capsule Take 1 capsule (667 mg total) by mouth daily with supper. Qty: 30 capsule, Refills: 0    cloNIDine (CATAPRES) 0.1 MG tablet Take 1 tablet (0.1 mg total) by mouth 3 (three) times daily. Qty: 90 tablet, Refills: 0    hydrALAZINE (APRESOLINE) 50 MG tablet Take 1 tablet (50 mg total) by mouth every 8 (eight) hours. Qty: 90 tablet, Refills: 0    levofloxacin (LEVAQUIN) 500 MG tablet Take 1 tablet (500 mg total) by mouth every other day. Qty: 4 tablet, Refills: 0    multivitamin (RENA-VIT) TABS tablet Take 1 tablet by mouth at bedtime. Qty: 30 each, Refills: 0    Nutritional Supplements (FEEDING SUPPLEMENT, NEPRO CARB STEADY,) LIQD Take 237 mLs by mouth daily at 3 pm. Refills: 0      CONTINUE these medications which have NOT CHANGED   Details  Cholecalciferol 1000 UNITS capsule Take 1,000 Units by mouth daily.    folic acid (FOLVITE) 1 MG tablet Take 1 mg by mouth daily.    predniSONE (DELTASONE) 5 MG tablet Take 20 mg by mouth daily.    HYDROcodone-acetaminophen (NORCO) 5-325 MG per tablet Take 1 tablet by mouth every 6 (six) hours as needed for moderate pain. Qty: 30 tablet, Refills: 0      STOP taking these medications     CALCIUM PO      estradiol (VAGIFEM) 25 MCG vaginal tablet      hydrochlorothiazide (MICROZIDE) 12.5 MG capsule        No Known Allergies Follow-up Information    Please follow up.   Why:  [facility],   August 14, 2014 @ 9am please go to admitting first.  Weekly infusions to continue August 17th, 24th, 31st and September 14th, 28th and thereafter every 2 weeks.       The results of significant diagnostics from this hospitalization (including imaging, microbiology, ancillary and laboratory) are listed below for reference.    Significant Diagnostic  Studies: Ct Abdomen Pelvis Wo Contrast  08/02/2014   CLINICAL DATA:  Acute renal failure.  Headache and nausea  EXAM: CT ABDOMEN AND PELVIS WITHOUT CONTRAST  TECHNIQUE: Multidetector CT imaging of the abdomen and pelvis was performed following the standard protocol without IV contrast.  COMPARISON:  None.  FINDINGS: Lower chest: Bilateral pleural effusions and basilar atelectasis.  Hepatobiliary: No focal hepatic lesion. No biliary duct dilatation. Gallbladder is normal. Common bile duct is normal.  Pancreas: Pancreas is normal. No ductal dilatation. No pancreatic inflammation.  Spleen: Scattered granulomata within the spleen liver.  Adrenals/urinary tract: Adrenal  glands are normal. No hydronephrosis. There is a intermediate density lesion extending from the posterior mid aspect of the LEFT kidney measuring 16 mm on image 28, series 2.  Stomach/Bowel: Stomach, small bowel are normal. Normal appendix is seen on image 30, series 5. There is inflammation within the fat surrounding the cecum and to lesser degree the ascending colon. There is a serosal inflammation along the cecum. The more distal. Transverse and descending colon are normal. The rectum is normal.  Vascular/Lymphatic: Abdominal aorta is normal caliber. There is no retroperitoneal or periportal lymphadenopathy. No pelvic lymphadenopathy.  Reproductive: Uterus and ovaries are normal.  Musculoskeletal: No aggressive osseous lesion.  Other: No free fluid.  IMPRESSION: 1. Inflammation primary involving the cecum. Differential would include ischemic colitis, infectious colitis and inflammatory bowel disease. 2. Indeterminate lesion extending posterior to the LEFT kidney. If the patient cannot receive IV contrast recommend ultrasound and potentially MRI for further evaluation.   Electronically Signed   By: Suzy Bouchard M.D.   On: 08/02/2014 18:48   Ct Head Wo Contrast  07/17/2014   CLINICAL DATA:  59 year old female with headache, nausea and  leukocytosis  EXAM: CT HEAD WITHOUT CONTRAST  TECHNIQUE: Contiguous axial images were obtained from the base of the skull through the vertex without intravenous contrast.  COMPARISON:  None.  FINDINGS: Negative for acute intracranial hemorrhage, acute infarction, mass, mass effect, hydrocephalus or midline shift. Gray-white differentiation is preserved throughout. No acute soft tissue or calvarial abnormality. The globes and orbits are symmetric and unremarkable. Normal aeration of the mastoid air cells and visualized paranasal sinuses. Small sclerotic focus in the right frontal calvarium just above the orbit likely represents a benign bone island.  IMPRESSION: Negative head CT.   Electronically Signed   By: Jacqulynn Cadet M.D.   On: 07/17/2014 17:04   Mr Brain Wo Contrast  07/18/2014   CLINICAL DATA:  Initial evaluation for acute headache.  EXAM: MRI HEAD WITHOUT CONTRAST  TECHNIQUE: Multiplanar, multiecho pulse sequences of the brain and surrounding structures were obtained without intravenous contrast.  COMPARISON:  Prior CT from 07/17/2014  FINDINGS: The CSF containing spaces are within normal limits for patient age. Few scattered T2/FLAIR hyperintense foci present within the periventricular and deep white matter of both cerebral hemispheres, nonspecific. No mass lesion, midline shift, or extra-axial fluid collection. Ventricles are normal in size without evidence of hydrocephalus.  No diffusion-weighted signal abnormality is identified to suggest acute intracranial infarct. Gray-white matter differentiation is maintained. Normal flow voids are seen within the intracranial vasculature. No intracranial hemorrhage identified.  The cervicomedullary junction is normal. Pituitary gland is within normal limits. Pituitary stalk is midline. The globes and optic nerves demonstrate a normal appearance with normal signal intensity. The  The bone marrow signal intensity is normal. Calvarium is intact. Visualized upper  cervical spine is within normal limits.  Scalp soft tissues are unremarkable.  Paranasal sinuses are clear.  No mastoid effusion.  IMPRESSION: 1. No acute intracranial process identified. 2. Few scattered subcentimeter T2/FLAIR height matter foci within the supratentorial white matter. These foci are nonspecific, but may be related to very mild chronic small vessel ischemic changes. These are felt to be within normal limits for patient age. 3. Otherwise normal brain MRI   Electronically Signed   By: Jeannine Boga M.D.   On: 07/18/2014 06:53   US Renal  07/18/2014   CLINICAL DATA:  Acute renal failure.  EXAM: RENAL / URINARY TRACT ULTRASOUND COMPLETE  COMPARISON:  None.  FINDINGS: Right Kidney:  Length: 10.0 cm. Echogenicity within normal limits. No mass or hydronephrosis visualized.  Left Kidney:  Length: 9.4 cm. Echogenicity within normal limits. No mass or hydronephrosis visualized.  Bladder:  Appears normal for degree of bladder distention.  Small bilateral pleural effusions incidentally noted.  IMPRESSION: No evidence of hydronephrosis or other renal abnormality.  Small bilateral pleural effusions incidentally noted.   Electronically Signed   By: Earle Gell M.D.   On: 07/18/2014 11:55   Nm Pulmonary Perf And Vent  08/04/2014   CLINICAL DATA:  Acute shortness of breath.  EXAM: NUCLEAR MEDICINE VENTILATION - PERFUSION LUNG SCAN  TECHNIQUE: Ventilation images were obtained in multiple projections using inhaled aerosol Tc-56m DTPA. Perfusion images were obtained in multiple projections after intravenous injection of Tc-64m MAA.  RADIOPHARMACEUTICALS:  38.5 Technetium-74m DTPA aerosol inhalation and 5.9 Technetium-60m MAA IV  COMPARISON:  08/04/2014 chest radiograph  FINDINGS: Ventilation: No focal ventilation defect.  Perfusion: No wedge shaped peripheral perfusion defects to suggest acute pulmonary embolism.  IMPRESSION: Normal exam.   Electronically Signed   By: Margarette Canada M.D.   On: 08/04/2014  07:05   US Biopsy  07/23/2014   CLINICAL DATA:  Renal insufficiency  EXAM: ULTRASOUND BIOPSY  COMPARISON:  None.  FINDINGS: Imaging was provided to the Nephrology service for renal biopsy.  IMPRESSION: See above.   Electronically Signed   By: Marybelle Killings M.D.   On: 07/23/2014 13:26   Ir Fluoro Guide Cv Line Right  07/24/2014   CLINICAL DATA:  Acute renal failure, needs access for hemodialysis  EXAM: TUNNELED HEMODIALYSIS CATHETER PLACEMENT WITH ULTRASOUND AND FLUOROSCOPIC GUIDANCE  TECHNIQUE: The procedure, risks, benefits, and alternatives were explained to the patient. Questions regarding the procedure were encouraged and answered. The patient understands and consents to the procedure. As antibiotic prophylaxis, cefazolin 2 g was ordered pre-procedure and administered intravenously within one hour of incision.Patency of the right IJ vein was confirmed with ultrasound with image documentation. An appropriate skin site was determined. Region was prepped using maximum barrier technique including cap and mask, sterile gown, sterile gloves, large sterile sheet, and Chlorhexidine as cutaneous antisepsis. The region was infiltrated locally with 1% lidocaine.  Intravenous Fentanyl and Versed were administered as conscious sedation during continuous cardiorespiratory monitoring by the radiology RN, with a total moderate sedation time of 10 minutes.  Under real-time ultrasound guidance, the right IJ vein was accessed with a 21 gauge micropuncture needle; the needle tip within the vein was confirmed with ultrasound image documentation. Needle exchanged over the 018 guidewire for transitional dilator, which allowed advancement of a Benson wire into the IVC. Over this, an MPA catheter was advanced. A Palindrome 19 hemodialysis catheter was tunneled from the right anterior chest wall approach to the right IJ dermatotomy site. The MPA catheter was exchanged over an Amplatz wire for serial vascular dilators which allow  placement of a peel-away sheath, through which the catheter was advanced under intermittent fluoroscopy, positioned with its tips in the proximal and midright atrium. Spot chest radiograph confirms good catheter position. No pneumothorax. Catheter was flushed and primed per protocol. Catheter secured externally with O Prolene suture. The right IJ dermatotomy site was closed with Dermabond.  COMPLICATIONS: COMPLICATIONS None immediate  FLUOROSCOPY TIME:  12 seconds, 3.7 mGy  COMPARISON:  None  IMPRESSION: 1. Technically successful placement of tunneled right IJ hemodialysis catheter with ultrasound and fluoroscopic guidance. Ready for routine use.   Electronically Signed   By: Eden Emms.D.  On: 07/24/2014 15:16   Ir US Guide Vasc Access Right  07/24/2014   CLINICAL DATA:  Acute renal failure, needs access for hemodialysis  EXAM: TUNNELED HEMODIALYSIS CATHETER PLACEMENT WITH ULTRASOUND AND FLUOROSCOPIC GUIDANCE  TECHNIQUE: The procedure, risks, benefits, and alternatives were explained to the patient. Questions regarding the procedure were encouraged and answered. The patient understands and consents to the procedure. As antibiotic prophylaxis, cefazolin 2 g was ordered pre-procedure and administered intravenously within one hour of incision.Patency of the right IJ vein was confirmed with ultrasound with image documentation. An appropriate skin site was determined. Region was prepped using maximum barrier technique including cap and mask, sterile gown, sterile gloves, large sterile sheet, and Chlorhexidine as cutaneous antisepsis. The region was infiltrated locally with 1% lidocaine.  Intravenous Fentanyl and Versed were administered as conscious sedation during continuous cardiorespiratory monitoring by the radiology RN, with a total moderate sedation time of 10 minutes.  Under real-time ultrasound guidance, the right IJ vein was accessed with a 21 gauge micropuncture needle; the needle tip within the vein was  confirmed with ultrasound image documentation. Needle exchanged over the 018 guidewire for transitional dilator, which allowed advancement of a Benson wire into the IVC. Over this, an MPA catheter was advanced. A Palindrome 19 hemodialysis catheter was tunneled from the right anterior chest wall approach to the right IJ dermatotomy site. The MPA catheter was exchanged over an Amplatz wire for serial vascular dilators which allow placement of a peel-away sheath, through which the catheter was advanced under intermittent fluoroscopy, positioned with its tips in the proximal and midright atrium. Spot chest radiograph confirms good catheter position. No pneumothorax. Catheter was flushed and primed per protocol. Catheter secured externally with O Prolene suture. The right IJ dermatotomy site was closed with Dermabond.  COMPLICATIONS: COMPLICATIONS None immediate  FLUOROSCOPY TIME:  12 seconds, 3.7 mGy  COMPARISON:  None  IMPRESSION: 1. Technically successful placement of tunneled right IJ hemodialysis catheter with ultrasound and fluoroscopic guidance. Ready for routine use.   Electronically Signed   By: Lucrezia Europe M.D.   On: 07/24/2014 15:16   Dg Chest Port 1 View  08/04/2014   CLINICAL DATA:  Shortness of breath.  EXAM: PORTABLE CHEST - 1 VIEW  COMPARISON:  None.  FINDINGS: Tip of the right dialysis catheter in the distal SVC, projects over overlying oxygen tubing. Heart is at the upper limits of normal in size. Diffuse reticular opacities, may reflect pulmonary edema. Left lobe opacity with left pleural effusion. No pneumothorax.  IMPRESSION: 1. Left lower lobe opacity with left pleural effusion. This may reflect pneumonia with parapneumonic effusion, versus effusion with compressive atelectasis. 2. Diffuse reticular opacities, suspect pulmonary edema. Mild cardiomegaly.   Electronically Signed   By: Jeb Levering M.D.   On: 08/04/2014 05:16   Dg Shoulder Right Port  08/05/2014   CLINICAL DATA:  Pain.  EXAM:  PORTABLE RIGHT SHOULDER - 2+ VIEW  COMPARISON:  07/09/2014  FINDINGS: Again noted is fracture of the right humerus, involving the greater tuberosity. No dislocation or new fracture identified. Right lung apex is clear. Right-sided dialysis catheter is present. There is degenerative change in the acromioclavicular joint.  IMPRESSION: Stable appearance of fracture involving the right greater tuberosity.   Electronically Signed   By: Nolon Nations M.D.   On: 08/05/2014 12:26    Microbiology: No results found for this or any previous visit (from the past 240 hour(s)).   Labs: Basic Metabolic Panel:  Recent Labs Lab 08/05/14 0912  08/06/14 0534 08/07/14 0542 08/08/14 0729 08/09/14 0522  NA 123* 125* 130* 125* 129*  K 4.1 4.3 3.7 3.3* 4.4  CL 88* 88* 95* 88* 96*  CO2 20* 21* 27 25 24   GLUCOSE 98 137* 97 136* 78  BUN 48* 29* 16 36* 18  CREATININE 7.76* 5.45* 3.99* 5.80* 3.79*  CALCIUM 8.4* 8.0* 8.4* 8.2* 8.5*  PHOS 4.1 4.2 3.1 2.8 2.2*   Liver Function Tests:  Recent Labs Lab 08/05/14 0912 08/06/14 0534 08/07/14 0542 08/08/14 0729 08/09/14 0522  ALBUMIN 3.4* 3.4* 3.4* 3.2* 3.5   No results for input(s): LIPASE, AMYLASE in the last 168 hours. No results for input(s): AMMONIA in the last 168 hours. CBC:  Recent Labs Lab 08/04/14 0539 08/05/14 0912 08/06/14 0534 08/07/14 0542 08/08/14 0746  WBC 13.2* 15.6* 8.4 11.7* 12.8*  HGB 10.1* 10.6* 10.5* 9.8* 9.2*  HCT 30.2* 31.7* 33.1* 30.6* 28.4*  MCV 89.9 89.8 90.4 92.2 91.3  PLT 226 220 169 133* 120*   Cardiac Enzymes: No results for input(s): CKTOTAL, CKMB, CKMBINDEX, TROPONINI in the last 168 hours. BNP: BNP (last 3 results) No results for input(s): BNP in the last 8760 hours.  ProBNP (last 3 results) No results for input(s): PROBNP in the last 8760 hours.  CBG: No results for input(s): GLUCAP in the last 168 hours.     Signed:  Velvet Bathe  [facility] 08/09/2014, 2:23 PM

## 2014-08-09 NOTE — Progress Notes (Signed)
Erin Morgan to be D/C'd Home per MD order.  Discussed prescriptions and follow up appointments with the patient. Prescriptions given to patient, medication list explained in detail. Pt verbalized understanding.    Medication List    STOP taking these medications        CALCIUM PO     estradiol 25 MCG vaginal tablet  Commonly known as:  VAGIFEM     hydrochlorothiazide 12.5 MG capsule  Commonly known as:  MICROZIDE      TAKE these medications        amLODipine 10 MG tablet  Commonly known as:  NORVASC  Take 1 tablet (10 mg total) by mouth at bedtime.     calcium acetate 667 MG capsule  Commonly known as:  PHOSLO  Take 1 capsule (667 mg total) by mouth daily with supper.     Cholecalciferol 1000 UNITS capsule  Take 1,000 Units by mouth daily.     cloNIDine 0.1 MG tablet  Commonly known as:  CATAPRES  Take 1 tablet (0.1 mg total) by mouth 3 (three) times daily.     feeding supplement (NEPRO CARB STEADY) Liqd  Take 237 mLs by mouth daily at 3 pm.     folic acid 1 MG tablet  Commonly known as:  FOLVITE  Take 1 mg by mouth daily.     hydrALAZINE 50 MG tablet  Commonly known as:  APRESOLINE  Take 1 tablet (50 mg total) by mouth every 8 (eight) hours.     HYDROcodone-acetaminophen 5-325 MG per tablet  Commonly known as:  NORCO  Take 1 tablet by mouth every 6 (six) hours as needed for moderate pain.     levofloxacin 500 MG tablet  Commonly known as:  LEVAQUIN  Take 1 tablet (500 mg total) by mouth every other day.  Start taking on:  08/10/2014     multivitamin Tabs tablet  Take 1 tablet by mouth at bedtime.     predniSONE 5 MG tablet  Commonly known as:  DELTASONE  Take 20 mg by mouth daily.        Filed Vitals:   08/09/14 1230  BP: 133/82  Pulse: 102  Temp:   Resp:     Skin clean, dry and intact without evidence of skin break down, no evidence of skin tears noted. IV catheter discontinued intact. Site without signs and symptoms of complications.  Dressing and pressure applied. Pt denies pain at this time. No complaints noted.  An After Visit Summary was printed and given to the patient. Patient escorted via [place], and D/C home via private auto.  Aarion Metzgar A 08/09/2014 3:02 PM

## 2014-08-13 ENCOUNTER — Other Ambulatory Visit (HOSPITAL_COMMUNITY): Payer: Self-pay | Admitting: *Deleted

## 2014-08-14 ENCOUNTER — Encounter (HOSPITAL_COMMUNITY): Payer: BC Managed Care – PPO

## 2014-08-15 ENCOUNTER — Other Ambulatory Visit (HOSPITAL_COMMUNITY): Payer: Self-pay | Admitting: *Deleted

## 2014-08-16 ENCOUNTER — Encounter (HOSPITAL_COMMUNITY)
Admission: RE | Admit: 2014-08-16 | Discharge: 2014-08-16 | Disposition: A | Discharge status: Home / Self Care | Payer: BC Managed Care – PPO | Source: Ambulatory Visit | Attending: Nephrology | Admitting: Nephrology

## 2014-08-16 DIAGNOSIS — D593 Hemolytic-uremic syndrome: Secondary | ICD-10-CM | POA: Insufficient documentation

## 2014-08-16 MED ORDER — SODIUM CHLORIDE 0.9 % IV SOLN
900.0000 mg | Freq: Once | INTRAVENOUS | Status: AC
  Administered 2014-08-16: 900 mg via INTRAVENOUS
  Filled 2014-08-16: qty 90

## 2014-08-23 ENCOUNTER — Encounter (HOSPITAL_COMMUNITY)
Admission: RE | Admit: 2014-08-23 | Discharge: 2014-08-23 | Disposition: A | Discharge status: Home / Self Care | Payer: BC Managed Care – PPO | Source: Ambulatory Visit | Attending: Nephrology | Admitting: Nephrology

## 2014-08-23 DIAGNOSIS — D593 Hemolytic-uremic syndrome: Secondary | ICD-10-CM | POA: Diagnosis not present

## 2014-08-23 MED ORDER — SODIUM CHLORIDE 0.9 % IV SOLN
900.0000 mg | Freq: Once | INTRAVENOUS | Status: AC
  Administered 2014-08-23: 900 mg via INTRAVENOUS
  Filled 2014-08-23: qty 90

## 2014-08-29 ENCOUNTER — Other Ambulatory Visit (HOSPITAL_COMMUNITY): Payer: Self-pay | Admitting: *Deleted

## 2014-08-30 ENCOUNTER — Encounter (HOSPITAL_COMMUNITY)
Admission: RE | Admit: 2014-08-30 | Discharge: 2014-08-30 | Disposition: A | Discharge status: Home / Self Care | Payer: BC Managed Care – PPO | Source: Ambulatory Visit | Attending: Nephrology | Admitting: Nephrology

## 2014-08-30 DIAGNOSIS — D593 Hemolytic-uremic syndrome: Secondary | ICD-10-CM | POA: Diagnosis not present

## 2014-08-30 MED ORDER — SODIUM CHLORIDE 0.9 % IV SOLN
900.0000 mg | Freq: Once | INTRAVENOUS | Status: AC
  Administered 2014-08-30: 900 mg via INTRAVENOUS
  Filled 2014-08-30: qty 90

## 2014-09-05 ENCOUNTER — Other Ambulatory Visit (HOSPITAL_COMMUNITY): Payer: Self-pay | Admitting: *Deleted

## 2014-09-06 ENCOUNTER — Encounter (HOSPITAL_COMMUNITY)
Admission: RE | Admit: 2014-09-06 | Discharge: 2014-09-06 | Disposition: A | Discharge status: Home / Self Care | Payer: BC Managed Care – PPO | Source: Ambulatory Visit | Attending: Nephrology | Admitting: Nephrology

## 2014-09-06 DIAGNOSIS — D593 Hemolytic-uremic syndrome: Secondary | ICD-10-CM | POA: Insufficient documentation

## 2014-09-06 MED ORDER — SODIUM CHLORIDE 0.9 % IV SOLN
1200.0000 mg | Freq: Once | INTRAVENOUS | Status: AC
  Administered 2014-09-06: 1200 mg via INTRAVENOUS
  Filled 2014-09-06: qty 120

## 2014-09-13 ENCOUNTER — Encounter (HOSPITAL_COMMUNITY): Payer: BC Managed Care – PPO

## 2014-09-20 ENCOUNTER — Encounter (HOSPITAL_COMMUNITY)
Admission: RE | Admit: 2014-09-20 | Discharge: 2014-09-20 | Disposition: A | Discharge status: Home / Self Care | Payer: BC Managed Care – PPO | Source: Ambulatory Visit | Attending: Nephrology | Admitting: Nephrology

## 2014-09-20 ENCOUNTER — Other Ambulatory Visit: Payer: Self-pay

## 2014-09-20 DIAGNOSIS — D593 Hemolytic-uremic syndrome: Secondary | ICD-10-CM | POA: Insufficient documentation

## 2014-09-20 DIAGNOSIS — N186 End stage renal disease: Secondary | ICD-10-CM

## 2014-09-20 DIAGNOSIS — Z01810 Encounter for preprocedural cardiovascular examination: Secondary | ICD-10-CM

## 2014-09-20 MED ORDER — SODIUM CHLORIDE 0.9 % IV SOLN
1200.0000 mg | Freq: Once | INTRAVENOUS | Status: AC
  Administered 2014-09-20: 1200 mg via INTRAVENOUS
  Filled 2014-09-20: qty 120

## 2014-09-20 MED ORDER — SODIUM CHLORIDE 0.9 % IV SOLN
Freq: Once | INTRAVENOUS | Status: AC
  Administered 2014-09-20: 15:00:00 via INTRAVENOUS

## 2014-10-04 ENCOUNTER — Encounter (HOSPITAL_COMMUNITY)
Admission: RE | Admit: 2014-10-04 | Discharge: 2014-10-04 | Disposition: A | Discharge status: Home / Self Care | Payer: BC Managed Care – PPO | Source: Ambulatory Visit | Attending: Nephrology | Admitting: Nephrology

## 2014-10-04 DIAGNOSIS — D593 Hemolytic-uremic syndrome: Secondary | ICD-10-CM | POA: Diagnosis not present

## 2014-10-04 MED ORDER — SODIUM CHLORIDE 0.9 % IV SOLN
Freq: Once | INTRAVENOUS | Status: AC
  Administered 2014-10-04: 10:00:00 via INTRAVENOUS

## 2014-10-04 MED ORDER — SODIUM CHLORIDE 0.9 % IV SOLN
1200.0000 mg | Freq: Once | INTRAVENOUS | Status: AC
  Administered 2014-10-04: 1200 mg via INTRAVENOUS
  Filled 2014-10-04: qty 120

## 2014-10-18 ENCOUNTER — Encounter (HOSPITAL_COMMUNITY)
Admission: RE | Admit: 2014-10-18 | Discharge: 2014-10-18 | Disposition: A | Discharge status: Home / Self Care | Payer: BC Managed Care – PPO | Source: Ambulatory Visit | Attending: Nephrology | Admitting: Nephrology

## 2014-10-18 DIAGNOSIS — D593 Hemolytic-uremic syndrome: Secondary | ICD-10-CM | POA: Insufficient documentation

## 2014-10-18 MED ORDER — SODIUM CHLORIDE 0.9 % IV SOLN
Freq: Once | INTRAVENOUS | Status: AC
  Administered 2014-10-18: 250 mL via INTRAVENOUS

## 2014-10-18 MED ORDER — SODIUM CHLORIDE 0.9 % IV SOLN
1200.0000 mg | Freq: Once | INTRAVENOUS | Status: AC
  Administered 2014-10-18: 1200 mg via INTRAVENOUS
  Filled 2014-10-18: qty 120

## 2014-10-22 ENCOUNTER — Encounter: Payer: Self-pay | Admitting: Vascular Surgery

## 2014-10-25 ENCOUNTER — Ambulatory Visit (INDEPENDENT_AMBULATORY_CARE_PROVIDER_SITE_OTHER): Payer: BC Managed Care – PPO | Admitting: Vascular Surgery

## 2014-10-25 ENCOUNTER — Ambulatory Visit (HOSPITAL_COMMUNITY)
Admission: RE | Admit: 2014-10-25 | Discharge: 2014-10-25 | Disposition: A | Discharge status: Home / Self Care | Payer: BC Managed Care – PPO | Source: Ambulatory Visit | Attending: Vascular Surgery | Admitting: Vascular Surgery

## 2014-10-25 ENCOUNTER — Encounter: Payer: Self-pay | Admitting: Vascular Surgery

## 2014-10-25 ENCOUNTER — Ambulatory Visit (INDEPENDENT_AMBULATORY_CARE_PROVIDER_SITE_OTHER)
Admission: RE | Admit: 2014-10-25 | Discharge: 2014-10-25 | Disposition: A | Discharge status: Home / Self Care | Payer: BC Managed Care – PPO | Source: Ambulatory Visit | Attending: Vascular Surgery | Admitting: Vascular Surgery

## 2014-10-25 VITALS — BP 136/73 | HR 70 | Temp 98.0°F | Resp 16 | Ht 64.0 in | Wt 113.0 lb

## 2014-10-25 DIAGNOSIS — Z01810 Encounter for preprocedural cardiovascular examination: Secondary | ICD-10-CM | POA: Diagnosis not present

## 2014-10-25 DIAGNOSIS — N179 Acute kidney failure, unspecified: Secondary | ICD-10-CM | POA: Diagnosis not present

## 2014-10-25 DIAGNOSIS — N186 End stage renal disease: Secondary | ICD-10-CM | POA: Insufficient documentation

## 2014-10-25 DIAGNOSIS — I10 Essential (primary) hypertension: Secondary | ICD-10-CM | POA: Insufficient documentation

## 2014-10-25 NOTE — Progress Notes (Signed)
Referred by:  Aretta Nip, MD [address] [place], [place] [address]  Reason for referral: New access  History of Present Illness  Erin Morgan is a 59 y.o. (1955/07/15) female who presents for evaluation for permanent access.  The patient is right hand dominant.  The patient has not had previous access procedures.  Previous central venous cannulation procedures include: RIJV TDC.  The patient has never had a PPM placed.  Past Medical History  Diagnosis Date  . Wears glasses   . Osteopenia   . Seasonal allergies   . PONV (postoperative nausea and vomiting)   . Anemia   . Hypertension     Past Surgical History  Procedure Laterality Date  . Tonsillectomy    . Colonoscopy    . Tumor excision  2000    right foot  . Carpal tunnel release Right 04/05/2014    Procedure: RIGHT CARPAL TUNNEL RELEASE;  Surgeon: Daryll Brod, MD;  Location: [facility];  Service: Orthopedics;  Laterality: Right;  ANESTHESIA:  IV REGIONAL FAB    Social History   Social History  . Marital Status: Widowed    Spouse Name: N/A  . Number of Children: N/A  . Years of Education: N/A   Occupational History  . Not on file.   Social History Main Topics  . Smoking status: Never Smoker   . Smokeless tobacco: Never Used  . Alcohol Use: No     Comment:  no  . Drug Use: No  . Sexual Activity: No   Other Topics Concern  . Not on file   Social History Narrative    Family History  Problem Relation Age of Onset  . Hypertension Mother   . Cervical cancer Mother   . Cancer Mother   . Hyperlipidemia Mother   . Hypertension Father   . Stroke Father   . Hyperlipidemia Father   . Rheum arthritis Other     Current Outpatient Prescriptions  Medication Sig Dispense Refill  . calcium acetate (PHOSLO) 667 MG capsule Take 1 capsule (667 mg total) by mouth daily with supper. 30 capsule 0  . cloNIDine (CATAPRES) 0.1 MG tablet Take 1 tablet (0.1 mg total) by mouth 3 (three)  times daily. 90 tablet 0  . hydrALAZINE (APRESOLINE) 50 MG tablet Take 1 tablet (50 mg total) by mouth every 8 (eight) hours. 90 tablet 0  . hydroxychloroquine (PLAQUENIL) 200 MG tablet Take 200 mg by mouth daily.    Marland Kitchen labetalol (NORMODYNE) 100 MG tablet Take 50 mg by mouth 2 (two) times daily.    . multivitamin (RENA-VIT) TABS tablet Take 1 tablet by mouth at bedtime. 30 each 0  . amLODipine (NORVASC) 10 MG tablet Take 1 tablet (10 mg total) by mouth at bedtime. (Patient not taking: Reported on 10/25/2014) 30 tablet 0  . Cholecalciferol 1000 UNITS capsule Take 1,000 Units by mouth daily.    . folic acid (FOLVITE) 1 MG tablet Take 1 mg by mouth daily.    Marland Kitchen HYDROcodone-acetaminophen (NORCO) 5-325 MG per tablet Take 1 tablet by mouth every 6 (six) hours as needed for moderate pain. (Patient not taking: Reported on 10/25/2014) 30 tablet 0  . labetalol (NORMODYNE) 200 MG tablet     . levofloxacin (LEVAQUIN) 500 MG tablet Take 1 tablet (500 mg total) by mouth every other day. (Patient not taking: Reported on 10/25/2014) 4 tablet 0  . meclizine (ANTIVERT) 12.5 MG tablet     . Nutritional Supplements (  FEEDING SUPPLEMENT, NEPRO CARB STEADY,) LIQD Take 237 mLs by mouth daily at 3 pm. (Patient not taking: Reported on 10/25/2014)  0  . ondansetron (ZOFRAN) 8 MG tablet     . predniSONE (DELTASONE) 5 MG tablet Take 20 mg by mouth daily.    Marland Kitchen VAGIFEM 10 MCG TABS vaginal tablet      No current facility-administered medications for this visit.    No Known Allergies  REVIEW OF SYSTEMS:  (Positives checked otherwise negative)  CARDIOVASCULAR:   [ ]  chest pain,  [ ]  chest pressure,  [ ]  palpitations,  [ ]  shortness of breath when laying flat,  [ ]  shortness of breath with exertion,   [ ]  pain in feet when walking,  [ ]  pain in feet when laying flat, [ ]  history of blood clot in veins (DVT),  [ ]  history of phlebitis,  [ ]  swelling in legs,  [ ]  varicose veins  PULMONARY:   [ ]  productive cough,    [ ]  asthma,  [ ]  wheezing  NEUROLOGIC:   [ ]  weakness in arms or legs,  [ ]  numbness in arms or legs,  [ ]  difficulty speaking or slurred speech,  [ ]  temporary loss of vision in one eye,  [ ]  dizziness  HEMATOLOGIC:   [ ]  bleeding problems,  [ ]  problems with blood clotting too easily  MUSCULOSKEL:   [ ]  joint pain, [ ]  joint swelling  GASTROINTEST:   [ ]  vomiting blood,  [ ]  blood in stool     GENITOURINARY:   [ ]  burning with urination,  [ ]  blood in urine  PSYCHIATRIC:   [ ]  history of major depression  INTEGUMENTARY:   [ ]  rashes,  [ ]  ulcers  CONSTITUTIONAL:   [ ]  fever,  [ ]  chills   Physical Examination  Filed Vitals:   10/25/14 1433  BP: 136/73  Pulse: 70  Temp: 98 F (36.7 C)  TempSrc: Oral  Resp: 16  Height: 5\' 4"  (1.626 m)  Weight: 113 lb (51.256 kg)  SpO2: 100%   Body mass index is 19.39 kg/(m^2).  General: A&O x 3, WD, thin  Head: [place]/AT  Ear/Nose/Throat: Hearing grossly intact, nares w/o erythema or drainage, oropharynx w/o Erythema/Exudate, Mallampati score: 3  Eyes: PERRLA, EOMI  Neck: Supple, no nuchal rigidity, no palpable LAD  Pulmonary: Sym exp, good air movt, CTAB, no rales, rhonchi, & wheezing  Cardiac: RRR, Nl S1, S2, no Murmurs, rubs or gallops  Vascular: Vessel Right Left  Radial Palpable Palpable  Ulnar Not Palpable Not Palpable  Brachial Palpable Palpable  Carotid Palpable, without bruit Palpable, without bruit  Aorta Not palpable N/A  Femoral Palpable Palpable  Popliteal Not palpable Not palpable  PT Palpable Palpable  DP Palpable Palpable   Gastrointestinal: soft, NTND, -G/R, - HSM, - masses, - CVAT B  Musculoskeletal: M/S 5/5 throughout , Extremities without ischemic changes   Neurologic: CN 2-12 intact , Pain and light touch intact in extremities , Motor exam as listed above  Psychiatric: Judgment intact, Mood & affect appropriate for pt's clinical situation  Dermatologic: See M/S exam for  extremity exam, no rashes otherwise noted  Lymph : No Cervical, Axillary, or Inguinal lymphadenopathy    Non-Invasive Vascular Imaging  Vein Mapping  (Date: 10/25/2014):   R arm: acceptable vein conduits include entire cephalic, upper arm basilic   L arm: acceptable vein conduits include entire cephalic  BUE Doppler (Date: 10/25/2014):   R arm:  Brachial: tri, 3.4 mm  Radial: tri, 1.9 mm  Ulnar: tri, 1.7 mm  L arm:   Brachial: tri, 3.2 mm  Radial: tri, 1.8 mm  Ulnar: tri, 1.9 mm    Medical Decision Making  Hajrah Nerby is a 59 y.o. female who presents with ESRD requiring hemodialysis.    Based on vein mapping and examination, this patient's permanent access options include: L BC AVF, R BC AVF, R staged BVT  I would start with L BC AVF.  I had an extensive discussion with this patient in regards to the nature of access surgery, including risk, benefits, and alternatives.    The patient is aware that the risks of access surgery include but are not limited to: bleeding, infection, steal syndrome, nerve damage, ischemic monomelic neuropathy, failure of access to mature, and possible need for additional access procedures in the future.  The patient has not agreed to proceed with the above procedure.  Will schedule the patient when she is ready to proceed.   Adele Barthel, MD [facility] Office: [phone] Pager: [phone]  10/25/2014, 3:42 PM

## 2014-11-01 ENCOUNTER — Encounter (HOSPITAL_COMMUNITY)
Admission: RE | Admit: 2014-11-01 | Discharge: 2014-11-01 | Disposition: A | Discharge status: Home / Self Care | Payer: BC Managed Care – PPO | Source: Ambulatory Visit | Attending: Nephrology | Admitting: Nephrology

## 2014-11-01 DIAGNOSIS — D593 Hemolytic-uremic syndrome: Secondary | ICD-10-CM | POA: Insufficient documentation

## 2014-11-01 MED ORDER — SODIUM CHLORIDE 0.9 % IV SOLN
1200.0000 mg | Freq: Once | INTRAVENOUS | Status: DC
  Filled 2014-11-01: qty 120

## 2014-11-05 ENCOUNTER — Other Ambulatory Visit: Payer: Self-pay

## 2014-11-14 ENCOUNTER — Other Ambulatory Visit (HOSPITAL_COMMUNITY): Payer: Self-pay

## 2014-11-15 ENCOUNTER — Encounter (HOSPITAL_COMMUNITY)
Admission: RE | Admit: 2014-11-15 | Discharge: 2014-11-15 | Disposition: A | Discharge status: Home / Self Care | Payer: BC Managed Care – PPO | Source: Ambulatory Visit | Attending: Nephrology | Admitting: Nephrology

## 2014-11-15 ENCOUNTER — Encounter (HOSPITAL_COMMUNITY): Payer: Self-pay | Admitting: *Deleted

## 2014-11-15 DIAGNOSIS — D593 Hemolytic-uremic syndrome: Secondary | ICD-10-CM | POA: Diagnosis present

## 2014-11-15 MED ORDER — SODIUM CHLORIDE 0.9 % IV SOLN
1200.0000 mg | Freq: Once | INTRAVENOUS | Status: AC
  Administered 2014-11-15: 1200 mg via INTRAVENOUS
  Filled 2014-11-15: qty 120

## 2014-11-15 MED FILL — Eculizumab IV Soln 300 MG/30ML (10 MG/ML) (For Infusion): INTRAVENOUS | Qty: 120 | Status: AC

## 2014-11-15 NOTE — Progress Notes (Signed)
Pt denies cardiac history, chest pain or sob. She is concerned about being NPO from midnight, states she usually has to have something to eat after midnight or she gets very nauseated and starts to throw up. She understands that she has to be NPO, but just wanted to make [place] aware that she might arrive with N/V.

## 2014-11-18 ENCOUNTER — Ambulatory Visit (HOSPITAL_COMMUNITY): Payer: BC Managed Care – PPO | Admitting: Certified Registered Nurse Anesthetist

## 2014-11-18 ENCOUNTER — Encounter (HOSPITAL_COMMUNITY)
Admission: RE | Disposition: A | Discharge status: Home / Self Care | Payer: Self-pay | Source: Ambulatory Visit | Attending: Vascular Surgery

## 2014-11-18 ENCOUNTER — Encounter (HOSPITAL_COMMUNITY): Payer: Self-pay | Admitting: Certified Registered Nurse Anesthetist

## 2014-11-18 ENCOUNTER — Ambulatory Visit (HOSPITAL_COMMUNITY)
Admission: RE | Admit: 2014-11-18 | Discharge: 2014-11-18 | Disposition: A | Discharge status: Home / Self Care | Payer: BC Managed Care – PPO | Source: Ambulatory Visit | Attending: Vascular Surgery | Admitting: Vascular Surgery

## 2014-11-18 DIAGNOSIS — D649 Anemia, unspecified: Secondary | ICD-10-CM | POA: Insufficient documentation

## 2014-11-18 DIAGNOSIS — M1990 Unspecified osteoarthritis, unspecified site: Secondary | ICD-10-CM | POA: Insufficient documentation

## 2014-11-18 DIAGNOSIS — Z992 Dependence on renal dialysis: Secondary | ICD-10-CM | POA: Diagnosis not present

## 2014-11-18 DIAGNOSIS — Z79899 Other long term (current) drug therapy: Secondary | ICD-10-CM | POA: Diagnosis not present

## 2014-11-18 DIAGNOSIS — I120 Hypertensive chronic kidney disease with stage 5 chronic kidney disease or end stage renal disease: Secondary | ICD-10-CM | POA: Diagnosis not present

## 2014-11-18 DIAGNOSIS — Z7952 Long term (current) use of systemic steroids: Secondary | ICD-10-CM | POA: Diagnosis not present

## 2014-11-18 DIAGNOSIS — N186 End stage renal disease: Secondary | ICD-10-CM | POA: Diagnosis present

## 2014-11-18 DIAGNOSIS — K219 Gastro-esophageal reflux disease without esophagitis: Secondary | ICD-10-CM | POA: Diagnosis not present

## 2014-11-18 DIAGNOSIS — N185 Chronic kidney disease, stage 5: Secondary | ICD-10-CM | POA: Diagnosis not present

## 2014-11-18 HISTORY — DX: Constipation, unspecified: K59.00

## 2014-11-18 HISTORY — DX: Family history of other specified conditions: Z84.89

## 2014-11-18 HISTORY — DX: Rheumatoid arthritis, unspecified: M06.9

## 2014-11-18 HISTORY — DX: Gastro-esophageal reflux disease without esophagitis: K21.9

## 2014-11-18 HISTORY — PX: AV FISTULA PLACEMENT: SHX1204

## 2014-11-18 HISTORY — DX: Chronic kidney disease, unspecified: N18.9

## 2014-11-18 LAB — POCT I-STAT 4, (NA,K, GLUC, HGB,HCT)
GLUCOSE: 101 mg/dL — AB (ref 65–99)
HEMATOCRIT: 41 % (ref 36.0–46.0)
Hemoglobin: 13.9 g/dL (ref 12.0–15.0)
Potassium: 4.6 mmol/L (ref 3.5–5.1)
Sodium: 127 mmol/L — ABNORMAL LOW (ref 135–145)

## 2014-11-18 SURGERY — ARTERIOVENOUS (AV) FISTULA CREATION
Anesthesia: Monitor Anesthesia Care | Site: Arm Upper | Laterality: Left

## 2014-11-18 MED ORDER — MIDAZOLAM HCL 2 MG/2ML IJ SOLN
INTRAMUSCULAR | Status: AC
  Filled 2014-11-18: qty 4

## 2014-11-18 MED ORDER — PROPOFOL 10 MG/ML IV BOLUS
INTRAVENOUS | Status: AC
  Filled 2014-11-18: qty 20

## 2014-11-18 MED ORDER — THROMBIN 20000 UNITS EX SOLR
CUTANEOUS | Status: AC
  Filled 2014-11-18: qty 20000

## 2014-11-18 MED ORDER — MEPERIDINE HCL 25 MG/ML IJ SOLN: 6.2500 mg | INTRAMUSCULAR | Status: DC | PRN

## 2014-11-18 MED ORDER — FENTANYL CITRATE (PF) 100 MCG/2ML IJ SOLN: 25.0000 ug | INTRAMUSCULAR | Status: DC | PRN

## 2014-11-18 MED ORDER — PROPOFOL 500 MG/50ML IV EMUL
INTRAVENOUS | Status: DC | PRN
  Administered 2014-11-18: 50 ug/kg/min via INTRAVENOUS

## 2014-11-18 MED ORDER — DEXTROSE 5 % IV SOLN
1.5000 g | INTRAVENOUS | Status: AC
  Administered 2014-11-18: 1.5 g via INTRAVENOUS
  Filled 2014-11-18: qty 1.5

## 2014-11-18 MED ORDER — LIDOCAINE HCL (PF) 1 % IJ SOLN
INTRAMUSCULAR | Status: DC | PRN
  Administered 2014-11-18: 6 mL via INTRADERMAL

## 2014-11-18 MED ORDER — FENTANYL CITRATE (PF) 250 MCG/5ML IJ SOLN
INTRAMUSCULAR | Status: AC
  Filled 2014-11-18: qty 5

## 2014-11-18 MED ORDER — LIDOCAINE HCL (PF) 1 % IJ SOLN
INTRAMUSCULAR | Status: AC
  Filled 2014-11-18: qty 30

## 2014-11-18 MED ORDER — SODIUM CHLORIDE 0.9 % IV SOLN
INTRAVENOUS | Status: DC | PRN
  Administered 2014-11-18: 500 mL

## 2014-11-18 MED ORDER — OXYCODONE HCL 5 MG PO TABS: 5.0000 mg | ORAL_TABLET | Freq: Four times a day (QID) | ORAL | Status: DC | PRN

## 2014-11-18 MED ORDER — LACTATED RINGERS IV SOLN: INTRAVENOUS | Status: DC

## 2014-11-18 MED ORDER — SODIUM CHLORIDE 0.9 % IV SOLN
INTRAVENOUS | Status: DC
  Administered 2014-11-18: 08:00:00 via INTRAVENOUS

## 2014-11-18 MED ORDER — ONDANSETRON HCL 4 MG/2ML IJ SOLN
INTRAMUSCULAR | Status: DC | PRN
  Administered 2014-11-18: 4 mg via INTRAVENOUS

## 2014-11-18 MED ORDER — PROPOFOL 10 MG/ML IV BOLUS
INTRAVENOUS | Status: DC | PRN
  Administered 2014-11-18 (×3): 20 mg via INTRAVENOUS

## 2014-11-18 MED ORDER — 0.9 % SODIUM CHLORIDE (POUR BTL) OPTIME
TOPICAL | Status: DC | PRN
  Administered 2014-11-18: 1000 mL

## 2014-11-18 MED ORDER — FENTANYL CITRATE (PF) 100 MCG/2ML IJ SOLN
INTRAMUSCULAR | Status: DC | PRN
  Administered 2014-11-18 (×3): 25 ug via INTRAVENOUS

## 2014-11-18 MED ORDER — PROMETHAZINE HCL 25 MG/ML IJ SOLN: 6.2500 mg | INTRAMUSCULAR | Status: DC | PRN

## 2014-11-18 MED ORDER — SODIUM CHLORIDE 0.9 % IV SOLN
INTRAVENOUS | Status: DC | PRN
  Administered 2014-11-18: 12:00:00 via INTRAVENOUS

## 2014-11-18 MED ORDER — CHLORHEXIDINE GLUCONATE CLOTH 2 % EX PADS: 6.0000 | MEDICATED_PAD | Freq: Once | CUTANEOUS | Status: DC

## 2014-11-18 MED ORDER — LABETALOL HCL 5 MG/ML IV SOLN
INTRAVENOUS | Status: DC | PRN
  Administered 2014-11-18 (×2): 2.5 mg via INTRAVENOUS

## 2014-11-18 MED ORDER — MIDAZOLAM HCL 5 MG/5ML IJ SOLN
INTRAMUSCULAR | Status: DC | PRN
  Administered 2014-11-18: 2 mg via INTRAVENOUS

## 2014-11-18 SURGICAL SUPPLY — 27 items
ARMBAND PINK RESTRICT EXTREMIT (MISCELLANEOUS) ×3 IMPLANT
CANISTER SUCTION 2500CC (MISCELLANEOUS) ×3 IMPLANT
CLIP TI MEDIUM 6 (CLIP) ×3 IMPLANT
CLIP TI WIDE RED SMALL 6 (CLIP) ×3 IMPLANT
COVER PROBE W GEL 5X96 (DRAPES) ×3 IMPLANT
DECANTER SPIKE VIAL GLASS SM (MISCELLANEOUS) ×3 IMPLANT
ELECT REM PT RETURN 9FT ADLT (ELECTROSURGICAL) ×3
ELECTRODE REM PT RTRN 9FT ADLT (ELECTROSURGICAL) ×1 IMPLANT
GLOVE BIO SURGEON STRL SZ7 (GLOVE) ×3 IMPLANT
GLOVE BIOGEL PI IND STRL 7.5 (GLOVE) ×1 IMPLANT
GLOVE BIOGEL PI INDICATOR 7.5 (GLOVE) ×2
GOWN STRL REUS W/ TWL LRG LVL3 (GOWN DISPOSABLE) ×3 IMPLANT
GOWN STRL REUS W/TWL LRG LVL3 (GOWN DISPOSABLE) ×6
KIT BASIN OR (CUSTOM PROCEDURE TRAY) ×3 IMPLANT
KIT ROOM TURNOVER OR (KITS) ×3 IMPLANT
LIQUID BAND (GAUZE/BANDAGES/DRESSINGS) ×3 IMPLANT
NS IRRIG 1000ML POUR BTL (IV SOLUTION) ×3 IMPLANT
PACK CV ACCESS (CUSTOM PROCEDURE TRAY) ×3 IMPLANT
PAD ARMBOARD 7.5X6 YLW CONV (MISCELLANEOUS) ×6 IMPLANT
SPONGE SURGIFOAM ABS GEL 100 (HEMOSTASIS) IMPLANT
SUT MNCRL AB 4-0 PS2 18 (SUTURE) ×3 IMPLANT
SUT PROLENE 6 0 BV (SUTURE) IMPLANT
SUT PROLENE 7 0 BV 1 (SUTURE) ×6 IMPLANT
SUT VIC AB 3-0 SH 27 (SUTURE) ×2
SUT VIC AB 3-0 SH 27X BRD (SUTURE) ×1 IMPLANT
UNDERPAD 30X30 INCONTINENT (UNDERPADS AND DIAPERS) ×3 IMPLANT
WATER STERILE IRR 1000ML POUR (IV SOLUTION) ×3 IMPLANT

## 2014-11-18 NOTE — Anesthesia Procedure Notes (Addendum)
Procedure Name: MAC Date/Time: 11/18/2014 12:45 PM Performed by: Tressia Miners LEFFEW Pre-anesthesia Checklist: Patient identified, Emergency Drugs available, Suction available, Timeout performed and Patient being monitored Patient Re-evaluated:Patient Re-evaluated prior to inductionOxygen Delivery Method: Simple face mask Placement Confirmation: positive ETCO2

## 2014-11-18 NOTE — H&P (View-Only) (Signed)
Referred by:  Aretta Nip, MD [address] [place], [place] [address]  Reason for referral: New access  History of Present Illness  Erin Morgan is a 59 y.o. ([birth year]) female who presents for evaluation for permanent access.  The patient is right hand dominant.  The patient has not had previous access procedures.  Previous central venous cannulation procedures include: RIJV TDC.  The patient has never had a PPM placed.  Past Medical History  Diagnosis Date  . Wears glasses   . Osteopenia   . Seasonal allergies   . PONV (postoperative nausea and vomiting)   . Anemia   . Hypertension     Past Surgical History  Procedure Laterality Date  . Tonsillectomy    . Colonoscopy    . Tumor excision  2000    right foot  . Carpal tunnel release Right 04/05/2014    Procedure: RIGHT CARPAL TUNNEL RELEASE;  Surgeon: Daryll Brod, MD;  Location: [facility];  Service: Orthopedics;  Laterality: Right;  ANESTHESIA:  IV REGIONAL FAB    Social History   Social History  . Marital Status: Widowed    Spouse Name: N/A  . Number of Children: N/A  . Years of Education: N/A   Occupational History  . Not on file.   Social History Main Topics  . Smoking status: Never Smoker   . Smokeless tobacco: Never Used  . Alcohol Use: No     Comment:  no  . Drug Use: No  . Sexual Activity: No   Other Topics Concern  . Not on file   Social History Narrative    Family History  Problem Relation Age of Onset  . Hypertension Mother   . Cervical cancer Mother   . Cancer Mother   . Hyperlipidemia Mother   . Hypertension Father   . Stroke Father   . Hyperlipidemia Father   . Rheum arthritis Other     Current Outpatient Prescriptions  Medication Sig Dispense Refill  . calcium acetate (PHOSLO) 667 MG capsule Take 1 capsule (667 mg total) by mouth daily with supper. 30 capsule 0  . cloNIDine (CATAPRES) 0.1 MG tablet Take 1 tablet (0.1 mg total) by mouth 3 (three)  times daily. 90 tablet 0  . hydrALAZINE (APRESOLINE) 50 MG tablet Take 1 tablet (50 mg total) by mouth every 8 (eight) hours. 90 tablet 0  . hydroxychloroquine (PLAQUENIL) 200 MG tablet Take 200 mg by mouth daily.    Marland Kitchen labetalol (NORMODYNE) 100 MG tablet Take 50 mg by mouth 2 (two) times daily.    . multivitamin (RENA-VIT) TABS tablet Take 1 tablet by mouth at bedtime. 30 each 0  . amLODipine (NORVASC) 10 MG tablet Take 1 tablet (10 mg total) by mouth at bedtime. (Patient not taking: Reported on 10/25/2014) 30 tablet 0  . Cholecalciferol 1000 UNITS capsule Take 1,000 Units by mouth daily.    . folic acid (FOLVITE) 1 MG tablet Take 1 mg by mouth daily.    Marland Kitchen HYDROcodone-acetaminophen (NORCO) 5-325 MG per tablet Take 1 tablet by mouth every 6 (six) hours as needed for moderate pain. (Patient not taking: Reported on 10/25/2014) 30 tablet 0  . labetalol (NORMODYNE) 200 MG tablet     . levofloxacin (LEVAQUIN) 500 MG tablet Take 1 tablet (500 mg total) by mouth every other day. (Patient not taking: Reported on 10/25/2014) 4 tablet 0  . meclizine (ANTIVERT) 12.5 MG tablet     . Nutritional Supplements (  FEEDING SUPPLEMENT, NEPRO CARB STEADY,) LIQD Take 237 mLs by mouth daily at 3 pm. (Patient not taking: Reported on 10/25/2014)  0  . ondansetron (ZOFRAN) 8 MG tablet     . predniSONE (DELTASONE) 5 MG tablet Take 20 mg by mouth daily.    Marland Kitchen VAGIFEM 10 MCG TABS vaginal tablet      No current facility-administered medications for this visit.    No Known Allergies  REVIEW OF SYSTEMS:  (Positives checked otherwise negative)  CARDIOVASCULAR:   [ ]  chest pain,  [ ]  chest pressure,  [ ]  palpitations,  [ ]  shortness of breath when laying flat,  [ ]  shortness of breath with exertion,   [ ]  pain in feet when walking,  [ ]  pain in feet when laying flat, [ ]  history of blood clot in veins (DVT),  [ ]  history of phlebitis,  [ ]  swelling in legs,  [ ]  varicose veins  PULMONARY:   [ ]  productive cough,    [ ]  asthma,  [ ]  wheezing  NEUROLOGIC:   [ ]  weakness in arms or legs,  [ ]  numbness in arms or legs,  [ ]  difficulty speaking or slurred speech,  [ ]  temporary loss of vision in one eye,  [ ]  dizziness  HEMATOLOGIC:   [ ]  bleeding problems,  [ ]  problems with blood clotting too easily  MUSCULOSKEL:   [ ]  joint pain, [ ]  joint swelling  GASTROINTEST:   [ ]  vomiting blood,  [ ]  blood in stool     GENITOURINARY:   [ ]  burning with urination,  [ ]  blood in urine  PSYCHIATRIC:   [ ]  history of major depression  INTEGUMENTARY:   [ ]  rashes,  [ ]  ulcers  CONSTITUTIONAL:   [ ]  fever,  [ ]  chills   Physical Examination  Filed Vitals:   10/25/14 1433  BP: 136/73  Pulse: 70  Temp: 98 F (36.7 C)  TempSrc: Oral  Resp: 16  Height: 5\' 4"  (1.626 m)  Weight: 113 lb (51.256 kg)  SpO2: 100%   Body mass index is 19.39 kg/(m^2).  General: A&O x 3, WD, thin  Head: [place]/AT  Ear/Nose/Throat: Hearing grossly intact, nares w/o erythema or drainage, oropharynx w/o Erythema/Exudate, Mallampati score: 3  Eyes: PERRLA, EOMI  Neck: Supple, no nuchal rigidity, no palpable LAD  Pulmonary: Sym exp, good air movt, CTAB, no rales, rhonchi, & wheezing  Cardiac: RRR, Nl S1, S2, no Murmurs, rubs or gallops  Vascular: Vessel Right Left  Radial Palpable Palpable  Ulnar Not Palpable Not Palpable  Brachial Palpable Palpable  Carotid Palpable, without bruit Palpable, without bruit  Aorta Not palpable N/A  Femoral Palpable Palpable  Popliteal Not palpable Not palpable  PT Palpable Palpable  DP Palpable Palpable   Gastrointestinal: soft, NTND, -G/R, - HSM, - masses, - CVAT B  Musculoskeletal: M/S 5/5 throughout , Extremities without ischemic changes   Neurologic: CN 2-12 intact , Pain and light touch intact in extremities , Motor exam as listed above  Psychiatric: Judgment intact, Mood & affect appropriate for pt's clinical situation  Dermatologic: See M/S exam for  extremity exam, no rashes otherwise noted  Lymph : No Cervical, Axillary, or Inguinal lymphadenopathy    Non-Invasive Vascular Imaging  Vein Mapping  (Date: 10/25/2014):   R arm: acceptable vein conduits include entire cephalic, upper arm basilic   L arm: acceptable vein conduits include entire cephalic  BUE Doppler (Date: 10/25/2014):   R arm:  Brachial: tri, 3.4 mm  Radial: tri, 1.9 mm  Ulnar: tri, 1.7 mm  L arm:   Brachial: tri, 3.2 mm  Radial: tri, 1.8 mm  Ulnar: tri, 1.9 mm    Medical Decision Making  Erin Morgan is a 59 y.o. female who presents with ESRD requiring hemodialysis.    Based on vein mapping and examination, this patient's permanent access options include: L BC AVF, R BC AVF, R staged BVT  I would start with L BC AVF.  I had an extensive discussion with this patient in regards to the nature of access surgery, including risk, benefits, and alternatives.    The patient is aware that the risks of access surgery include but are not limited to: bleeding, infection, steal syndrome, nerve damage, ischemic monomelic neuropathy, failure of access to mature, and possible need for additional access procedures in the future.  The patient has not agreed to proceed with the above procedure.  Will schedule the patient when she is ready to proceed.   Adele Barthel, MD [facility] Office: [phone] Pager: [phone]  10/25/2014, 3:42 PM

## 2014-11-18 NOTE — Anesthesia Postprocedure Evaluation (Signed)
  Anesthesia Post-op Note  Patient: Erin Morgan  Procedure(s) Performed: Procedure(s): BRACHIOCEPHALIC ARTERIOVENOUS (AV) FISTULA CREATION (Left)  Patient Location: PACU  Anesthesia Type:MAC  Level of Consciousness: awake, alert  and oriented  Airway and Oxygen Therapy: Patient Spontanous Breathing  Post-op Pain: none  Post-op Assessment: Post-op Vital signs reviewed and Patient's Cardiovascular Status Stable              Post-op Vital Signs: Reviewed and stable  Last Vitals:  Filed Vitals:   11/18/14 1330  BP: 130/88  Pulse: 66  Temp:   Resp:     Complications: No apparent anesthesia complications

## 2014-11-18 NOTE — Op Note (Signed)
OPERATIVE NOTE   PROCEDURE: left brachiocephalic arteriovenous fistula placement  PRE-OPERATIVE DIAGNOSIS: end stage renal disease   POST-OPERATIVE DIAGNOSIS: same as above   SURGEON: Adele Barthel, MD  ASSISTANT(S): Leontine Locket, PAC   ANESTHESIA: local and MAC  ESTIMATED BLOOD LOSS: 50 cc  FINDING(S): 1.  Soft cephalic vein (3.0 mm) with minimal scarring around vein 2.  Soft brachial artery (2.5 mm) 3.  Strong thrill at end of case 4.  Faintly palpable left radial pulse  SPECIMEN(S):  none  INDICATIONS:   Erin Morgan is a 59 y.o. female who presents with end stage renal disease .  The patient is scheduled for left brachiocephalic arteriovenous fistula placement.  The patient is aware the risks include but are not limited to: bleeding, infection, steal syndrome, nerve damage, ischemic monomelic neuropathy, failure to mature, and need for additional procedures.  The patient is aware of the risks of the procedure and elects to proceed forward.  DESCRIPTION: After full informed written consent was obtained from the patient, the patient was brought back to the operating room and placed supine upon the operating table.  Prior to induction, the patient received IV antibiotics.   After obtaining adequate anesthesia, the patient was then prepped and draped in the standard fashion for a left arm access procedure.  I turned my attention first to identifying the patient's cephalic vein and brachial artery.  Using SonoSite guidance, the location of these vessels were marked out on the skin.   At this point, I injected local 7 cmm of 1% lidocaine without epinephrine.  I made a trnasverse incision at the level of the antecubitum and dissected through the subcutaneous tissue and fascia to gain exposure of the brachial artery.  This was noted to be 2.5 mm in diameter externally.  This was dissected out proximally and distally and controlled with vessel loops .  I then dissected out the  cephalic vein.  This was noted to be 3 mm in diameter externally.  The distal segment of the vein was ligated with a  2-0 silk, and the vein was transected.  The proximal segment was interrogated with serial dilators.  The vein accepted up to a 3.5 mm dilator without any difficulty.  I then instilled the heparinized saline into the vein and clamped it.  At this point, I reset my exposure of the brachial artery and placed the artery under tension proximally and distally.  I made an arteriotomy with a #11 blade, and then I extended the arteriotomy with a Potts scissor.  I injected heparinized saline proximal and distal to this arteriotomy.  The vein was then sewn to the artery in an end-to-side configuration with a running stitch of 7-0 Prolene.  Prior to completing this anastomosis, I allowed the vein and artery to backbleed.  There was no evidence of clot from any vessels.  I completed the anastomosis in the usual fashion and then released all vessel loops and clamps.  There was a palpable thrill in the venous outflow, and there was a faintly palpable radial pulse.  At this point, I irrigated out the surgical wound.  There was no further active bleeding.  A moderate side branch appeared to be tethering the fistula, so this was ligated and transected.  The subcutaneous tissue was reapproximated with a running stitch of 3-0 Vicryl.  The skin was then reapproximated with a running subcuticular stitch of 4-0 Vicryl.  The skin was then cleaned, dried, and reinforced with Dermabond.  The  patient tolerated this procedure well.    COMPLICATIONS: none  CONDITION: stable   Adele Barthel, MD [facility] Office: [phone] Pager: [phone]  11/18/2014, 12:59 PM

## 2014-11-18 NOTE — Interval H&P Note (Signed)
History and Physical Interval Note:  11/18/2014 7:32 AM  Erin Morgan  has presented today for surgery, with the diagnosis of End Stage Renal Disease N18.6  The various methods of treatment have been discussed with the patient and family. After consideration of risks, benefits and other options for treatment, the patient has consented to  Procedure(s): BRACHIOCEPHALIC ARTERIOVENOUS (AV) FISTULA CREATION (Left) as a surgical intervention .  The patient's history has been reviewed, patient examined, no change in status, stable for surgery.  I have reviewed the patient's chart and labs.  Questions were answered to the patient's satisfaction.     Adele Barthel

## 2014-11-18 NOTE — Anesthesia Preprocedure Evaluation (Addendum)
Anesthesia Evaluation  Patient identified by MRN, date of birth, ID band Patient awake    Reviewed: Allergy & Precautions, NPO status , Patient's Chart, lab work & pertinent test results, reviewed documented beta blocker date and time   History of Anesthesia Complications (+) PONV and Family history of anesthesia reaction  Airway Mallampati: III  TM Distance: >3 FB Neck ROM: Full  Mouth opening: Limited Mouth Opening  Dental  (+) Teeth Intact   Pulmonary neg pulmonary ROS,    breath sounds clear to auscultation       Cardiovascular hypertension, Pt. on medications and Pt. on home beta blockers  Rhythm:Regular Rate:Normal     Neuro/Psych  Headaches, negative psych ROS   GI/Hepatic Neg liver ROS, GERD  Medicated,  Endo/Other  negative endocrine ROS  Renal/GU CRFRenal disease  negative genitourinary   Musculoskeletal  (+) Arthritis ,   Abdominal   Peds negative pediatric ROS (+)  Hematology   Anesthesia Other Findings   Reproductive/Obstetrics negative OB ROS                           Lab Results  Component Value Date   WBC 12.8* 08/08/2014   HGB 9.2* 08/08/2014   HCT 28.4* 08/08/2014   MCV 91.3 08/08/2014   PLT 120* 08/08/2014   Lab Results  Component Value Date   CREATININE 3.79* 08/09/2014   BUN 18 08/09/2014   NA 129* 08/09/2014   K 4.4 08/09/2014   CL 96* 08/09/2014   CO2 24 08/09/2014   Lab Results  Component Value Date   INR 1.01 07/22/2014   INR 0.98 07/18/2014   EKG: normal EKG, normal sinus rhythm.  Echo (2016) Left ventricle: The cavity size was normal. Systolic function was normal. The estimated ejection fraction was in the range of 55% to 60%. Wall motion was normal; there were no regional wall motion abnormalities. - Atrial septum: No defect or patent foramen ovale was identified.  Anesthesia Physical Anesthesia Plan  ASA: III  Anesthesia Plan:  MAC   Post-op Pain Management:    Induction: Intravenous  Airway Management Planned: Natural Airway and Simple Face Mask  Additional Equipment:   Intra-op Plan:   Post-operative Plan:   Informed Consent: I have reviewed the patients History and Physical, chart, labs and discussed the procedure including the risks, benefits and alternatives for the proposed anesthesia with the patient or authorized representative who has indicated his/her understanding and acceptance.     Plan Discussed with: CRNA  Anesthesia Plan Comments:         Anesthesia Quick Evaluation

## 2014-11-18 NOTE — Addendum Note (Signed)
Addendum  created 11/18/14 1416 by Glynda Jaeger, CRNA   Modules edited: Anesthesia Blocks and Procedures, Clinical Notes   Clinical Notes:  File: [id number]

## 2014-11-18 NOTE — Transfer of Care (Signed)
Immediate Anesthesia Transfer of Care Note  Patient: Erin Morgan  Procedure(s) Performed: Procedure(s): BRACHIOCEPHALIC ARTERIOVENOUS (AV) FISTULA CREATION (Left)  Patient Location: PACU  Anesthesia Type:MAC  Level of Consciousness: awake, alert , oriented, patient cooperative and responds to stimulation  Airway & Oxygen Therapy: Patient Spontanous Breathing  Post-op Assessment: Report given to RN, Post -op Vital signs reviewed and stable and Patient moving all extremities X 4  Post vital signs: Reviewed and stable  Last Vitals:  Filed Vitals:   11/18/14 1302  BP: 154/80  Pulse: 74  Temp: 36.7 C  Resp: 15    Complications: No apparent anesthesia complications

## 2014-11-18 NOTE — Discharge Instructions (Signed)
° ° °  11/18/2014 Erin Morgan [id number] March 02, 1955  Surgeon(s): Conrad Mulga, MD  Procedure(s): BRACHIOCEPHALIC ARTERIOVENOUS (AV) FISTULA CREATION-Left  x Do not stick graft for 12 weeks

## 2014-11-19 ENCOUNTER — Telehealth: Payer: Self-pay | Admitting: Vascular Surgery

## 2014-11-19 ENCOUNTER — Encounter (HOSPITAL_COMMUNITY): Payer: Self-pay | Admitting: Vascular Surgery

## 2014-11-19 NOTE — Telephone Encounter (Signed)
LM for pt re appt, dpm °

## 2014-11-19 NOTE — Telephone Encounter (Signed)
-----   Message from Mena Goes, RN sent at 11/18/2014  1:53 PM EST ----- Regarding: schedule   ----- Message -----    From: Gabriel Earing, PA-C    Sent: 11/18/2014  12:55 PM      To: Vvs Charge Pool  S/p left BC AVF 11/18/14.  F/u with Dr. Bridgett Larsson in 6 weeks.  Thanks, Aldona Bar

## 2014-11-29 ENCOUNTER — Ambulatory Visit (HOSPITAL_COMMUNITY)
Admission: RE | Admit: 2014-11-29 | Discharge: 2014-11-29 | Disposition: A | Discharge status: Home / Self Care | Payer: BC Managed Care – PPO | Source: Ambulatory Visit | Attending: Nephrology | Admitting: Nephrology

## 2014-11-29 DIAGNOSIS — D593 Hemolytic-uremic syndrome: Secondary | ICD-10-CM | POA: Diagnosis present

## 2014-11-29 MED ORDER — SODIUM CHLORIDE 0.9 % IV SOLN
1200.0000 mg | Freq: Once | INTRAVENOUS | Status: AC
  Administered 2014-11-29: 1200 mg via INTRAVENOUS
  Filled 2014-11-29: qty 120

## 2014-12-13 ENCOUNTER — Encounter (HOSPITAL_COMMUNITY): Payer: BC Managed Care – PPO

## 2014-12-13 ENCOUNTER — Ambulatory Visit (HOSPITAL_COMMUNITY)
Admission: RE | Admit: 2014-12-13 | Discharge: 2014-12-13 | Disposition: A | Discharge status: Home / Self Care | Payer: BC Managed Care – PPO | Source: Ambulatory Visit | Attending: Nephrology | Admitting: Nephrology

## 2014-12-13 DIAGNOSIS — D593 Hemolytic-uremic syndrome: Secondary | ICD-10-CM | POA: Insufficient documentation

## 2014-12-13 MED ORDER — SODIUM CHLORIDE 0.9 % IV SOLN
1200.0000 mg | Freq: Once | INTRAVENOUS | Status: AC
  Administered 2014-12-13: 1200 mg via INTRAVENOUS
  Filled 2014-12-13: qty 120

## 2014-12-25 ENCOUNTER — Encounter: Payer: Self-pay | Admitting: Vascular Surgery

## 2014-12-27 ENCOUNTER — Encounter (HOSPITAL_COMMUNITY)
Admission: RE | Admit: 2014-12-27 | Discharge: 2014-12-27 | Disposition: A | Discharge status: Home / Self Care | Payer: BC Managed Care – PPO | Source: Ambulatory Visit | Attending: Nephrology | Admitting: Nephrology

## 2014-12-27 DIAGNOSIS — D593 Hemolytic-uremic syndrome: Secondary | ICD-10-CM | POA: Insufficient documentation

## 2014-12-27 MED ORDER — SODIUM CHLORIDE 0.9 % IV SOLN
1200.0000 mg | Freq: Once | INTRAVENOUS | Status: AC
  Administered 2014-12-27: 1200 mg via INTRAVENOUS
  Filled 2014-12-27: qty 120

## 2014-12-27 MED ORDER — SODIUM CHLORIDE 0.9 % IV SOLN
INTRAVENOUS | Status: DC
  Administered 2014-12-27: 09:00:00 via INTRAVENOUS

## 2015-01-03 ENCOUNTER — Ambulatory Visit (INDEPENDENT_AMBULATORY_CARE_PROVIDER_SITE_OTHER): Payer: Self-pay | Admitting: Vascular Surgery

## 2015-01-03 ENCOUNTER — Encounter (HOSPITAL_COMMUNITY): Payer: BC Managed Care – PPO

## 2015-01-03 ENCOUNTER — Encounter: Payer: Self-pay | Admitting: Vascular Surgery

## 2015-01-03 VITALS — BP 131/83 | HR 69 | Temp 98.3°F | Ht 64.0 in | Wt 114.0 lb

## 2015-01-03 DIAGNOSIS — N186 End stage renal disease: Secondary | ICD-10-CM

## 2015-01-03 DIAGNOSIS — Z992 Dependence on renal dialysis: Secondary | ICD-10-CM

## 2015-01-03 NOTE — Progress Notes (Signed)
    Postoperative Access Visit   History of Present Illness  Boston Schied is a 59 y.o. year old female who presents for postoperative follow-up for: L BC AVF (Date: 11/18/14).  The patient's wounds are healed.  The patient notes no steal symptoms.  The patient is able to complete their activities of daily living.  The patient's current symptoms are: mild forearm numbness.  For VQI Use Only  PRE-ADM LIVING: Home  AMB STATUS: Ambulatory  Physical Examination Filed Vitals:   01/03/15 1114  BP: 131/83  Pulse: 69  Temp: 98.3 F (36.8 C)    LUE: Incision is healed, skin feels warm, hand grip is 5/5, sensation in digits is intact, palpable thrill, bruit can be auscultated, fistula is visible, fistula > 6 mm throughout on [place] Nauert is a 59 y.o. year old female who presents s/p L BC AVF .  The patient's access is ready for use.  The patient's tunneled dialysis catheter can be removed after two successful cannulations and completed dialysis treatments.  Thank you for allowing [place] to participate in this patient's care.  Adele Barthel, MD [facility] Office: [phone] Pager: [phone]  01/03/2015, 12:33 PM

## 2015-01-10 ENCOUNTER — Encounter (HOSPITAL_COMMUNITY)
Admission: RE | Admit: 2015-01-10 | Discharge: 2015-01-10 | Disposition: A | Discharge status: Home / Self Care | Payer: Medicare Other | Source: Ambulatory Visit | Attending: Nephrology | Admitting: Nephrology

## 2015-01-10 DIAGNOSIS — D593 Hemolytic-uremic syndrome: Secondary | ICD-10-CM | POA: Insufficient documentation

## 2015-01-10 MED ORDER — ECULIZUMAB 300 MG/30ML IV SOLN
1200.0000 mg | Freq: Once | INTRAVENOUS | Status: AC
  Administered 2015-01-10: 1200 mg via INTRAVENOUS
  Filled 2015-01-10: qty 120

## 2015-01-10 MED ORDER — SODIUM CHLORIDE 0.9 % IV SOLN
INTRAVENOUS | Status: DC
  Administered 2015-01-10: 09:00:00 via INTRAVENOUS

## 2015-01-24 ENCOUNTER — Ambulatory Visit (HOSPITAL_COMMUNITY)
Admission: RE | Admit: 2015-01-24 | Discharge: 2015-01-24 | Disposition: A | Discharge status: Home / Self Care | Payer: Medicare Other | Source: Ambulatory Visit | Attending: Nephrology | Admitting: Nephrology

## 2015-01-24 DIAGNOSIS — D593 Hemolytic-uremic syndrome: Secondary | ICD-10-CM | POA: Insufficient documentation

## 2015-01-24 MED ORDER — SODIUM CHLORIDE 0.9 % IV SOLN
1200.0000 mg | INTRAVENOUS | Status: DC
  Administered 2015-01-24: 1200 mg via INTRAVENOUS
  Filled 2015-01-24: qty 120

## 2015-02-03 DIAGNOSIS — D593 Hemolytic-uremic syndrome: Secondary | ICD-10-CM | POA: Diagnosis not present

## 2015-02-03 DIAGNOSIS — M069 Rheumatoid arthritis, unspecified: Secondary | ICD-10-CM | POA: Diagnosis not present

## 2015-02-03 DIAGNOSIS — Z0001 Encounter for general adult medical examination with abnormal findings: Secondary | ICD-10-CM | POA: Diagnosis not present

## 2015-02-03 DIAGNOSIS — N19 Unspecified kidney failure: Secondary | ICD-10-CM | POA: Diagnosis not present

## 2015-02-03 DIAGNOSIS — I10 Essential (primary) hypertension: Secondary | ICD-10-CM | POA: Diagnosis not present

## 2015-02-03 DIAGNOSIS — R1013 Epigastric pain: Secondary | ICD-10-CM | POA: Diagnosis not present

## 2015-02-03 DIAGNOSIS — R110 Nausea: Secondary | ICD-10-CM | POA: Diagnosis not present

## 2015-02-06 ENCOUNTER — Other Ambulatory Visit (HOSPITAL_COMMUNITY): Payer: Self-pay | Admitting: *Deleted

## 2015-02-07 ENCOUNTER — Ambulatory Visit (HOSPITAL_COMMUNITY)
Admission: RE | Admit: 2015-02-07 | Discharge: 2015-02-07 | Disposition: A | Discharge status: Home / Self Care | Payer: BC Managed Care – PPO | Source: Ambulatory Visit | Attending: Nephrology | Admitting: Nephrology

## 2015-02-07 DIAGNOSIS — D593 Hemolytic-uremic syndrome: Secondary | ICD-10-CM | POA: Insufficient documentation

## 2015-02-07 MED ORDER — SODIUM CHLORIDE 0.9 % IV SOLN
1200.0000 mg | INTRAVENOUS | Status: DC
  Administered 2015-02-07: 1200 mg via INTRAVENOUS
  Filled 2015-02-07: qty 120

## 2015-02-21 ENCOUNTER — Ambulatory Visit (HOSPITAL_COMMUNITY)
Admission: RE | Admit: 2015-02-21 | Discharge: 2015-02-21 | Disposition: A | Discharge status: Home / Self Care | Payer: BC Managed Care – PPO | Source: Ambulatory Visit | Attending: Nephrology | Admitting: Nephrology

## 2015-02-21 DIAGNOSIS — D593 Hemolytic-uremic syndrome: Secondary | ICD-10-CM | POA: Diagnosis not present

## 2015-02-21 MED ORDER — SODIUM CHLORIDE 0.9 % IV SOLN
1200.0000 mg | INTRAVENOUS | Status: DC
  Administered 2015-02-21: 1200 mg via INTRAVENOUS
  Filled 2015-02-21: qty 120

## 2015-03-04 DIAGNOSIS — M311 Thrombotic microangiopathy: Secondary | ICD-10-CM | POA: Diagnosis not present

## 2015-03-04 DIAGNOSIS — Z992 Dependence on renal dialysis: Secondary | ICD-10-CM | POA: Diagnosis not present

## 2015-03-04 DIAGNOSIS — N186 End stage renal disease: Secondary | ICD-10-CM | POA: Diagnosis not present

## 2015-03-07 ENCOUNTER — Ambulatory Visit (HOSPITAL_COMMUNITY)
Admission: RE | Admit: 2015-03-07 | Discharge: 2015-03-07 | Disposition: A | Discharge status: Home / Self Care | Payer: BC Managed Care – PPO | Source: Ambulatory Visit | Attending: Nephrology | Admitting: Nephrology

## 2015-03-07 DIAGNOSIS — D593 Hemolytic-uremic syndrome: Secondary | ICD-10-CM | POA: Diagnosis present

## 2015-03-07 MED ORDER — SODIUM CHLORIDE 0.9 % IV SOLN
1200.0000 mg | INTRAVENOUS | Status: DC
  Administered 2015-03-07: 1200 mg via INTRAVENOUS
  Filled 2015-03-07: qty 120

## 2015-03-10 DIAGNOSIS — Z992 Dependence on renal dialysis: Secondary | ICD-10-CM | POA: Diagnosis not present

## 2015-03-10 DIAGNOSIS — D593 Hemolytic-uremic syndrome: Secondary | ICD-10-CM | POA: Diagnosis not present

## 2015-03-10 DIAGNOSIS — M064 Inflammatory polyarthropathy: Secondary | ICD-10-CM | POA: Diagnosis not present

## 2015-03-10 DIAGNOSIS — R768 Other specified abnormal immunological findings in serum: Secondary | ICD-10-CM | POA: Diagnosis not present

## 2015-03-14 ENCOUNTER — Other Ambulatory Visit: Payer: Self-pay | Admitting: Gastroenterology

## 2015-03-20 ENCOUNTER — Other Ambulatory Visit (HOSPITAL_COMMUNITY): Payer: Self-pay | Admitting: *Deleted

## 2015-03-21 ENCOUNTER — Encounter (HOSPITAL_COMMUNITY)
Admission: RE | Admit: 2015-03-21 | Discharge: 2015-03-21 | Disposition: A | Discharge status: Home / Self Care | Payer: BC Managed Care – PPO | Source: Ambulatory Visit | Attending: Nephrology | Admitting: Nephrology

## 2015-03-21 DIAGNOSIS — D593 Hemolytic-uremic syndrome: Secondary | ICD-10-CM | POA: Diagnosis not present

## 2015-03-21 MED ORDER — SODIUM CHLORIDE 0.9 % IV SOLN
1200.0000 mg | INTRAVENOUS | Status: DC
  Administered 2015-03-21: 1200 mg via INTRAVENOUS
  Filled 2015-03-21: qty 120

## 2015-04-04 ENCOUNTER — Ambulatory Visit (HOSPITAL_COMMUNITY)
Admission: RE | Admit: 2015-04-04 | Discharge: 2015-04-04 | Disposition: A | Discharge status: Home / Self Care | Payer: BC Managed Care – PPO | Source: Ambulatory Visit | Attending: Nephrology | Admitting: Nephrology

## 2015-04-04 DIAGNOSIS — D593 Hemolytic-uremic syndrome: Secondary | ICD-10-CM | POA: Diagnosis present

## 2015-04-04 MED ORDER — SODIUM CHLORIDE 0.9 % IV SOLN
1200.0000 mg | INTRAVENOUS | Status: DC
  Administered 2015-04-04: 1200 mg via INTRAVENOUS
  Filled 2015-04-04: qty 120

## 2015-04-18 ENCOUNTER — Encounter (HOSPITAL_COMMUNITY)
Admission: RE | Admit: 2015-04-18 | Discharge: 2015-04-18 | Disposition: A | Discharge status: Home / Self Care | Payer: BC Managed Care – PPO | Source: Ambulatory Visit | Attending: Nephrology | Admitting: Nephrology

## 2015-04-18 DIAGNOSIS — D593 Hemolytic-uremic syndrome: Secondary | ICD-10-CM | POA: Diagnosis not present

## 2015-04-18 MED ORDER — SODIUM CHLORIDE 0.9 % IV SOLN
1200.0000 mg | INTRAVENOUS | Status: DC
  Administered 2015-04-18: 1200 mg via INTRAVENOUS
  Filled 2015-04-18: qty 120

## 2015-04-30 ENCOUNTER — Other Ambulatory Visit (HOSPITAL_COMMUNITY): Payer: Self-pay | Admitting: *Deleted

## 2015-05-01 ENCOUNTER — Encounter (HOSPITAL_COMMUNITY)
Admission: RE | Admit: 2015-05-01 | Discharge: 2015-05-01 | Disposition: A | Discharge status: Home / Self Care | Payer: BC Managed Care – PPO | Source: Ambulatory Visit | Attending: Nephrology | Admitting: Nephrology

## 2015-05-01 DIAGNOSIS — D593 Hemolytic-uremic syndrome: Secondary | ICD-10-CM | POA: Diagnosis not present

## 2015-05-01 MED ORDER — SODIUM CHLORIDE 0.9 % IV SOLN
1200.0000 mg | INTRAVENOUS | Status: DC
  Administered 2015-05-01: 1200 mg via INTRAVENOUS
  Filled 2015-05-01: qty 120

## 2015-05-02 ENCOUNTER — Encounter (HOSPITAL_COMMUNITY): Payer: BC Managed Care – PPO

## 2015-05-15 ENCOUNTER — Ambulatory Visit (HOSPITAL_COMMUNITY)
Admission: RE | Admit: 2015-05-15 | Discharge: 2015-05-15 | Disposition: A | Discharge status: Home / Self Care | Payer: BC Managed Care – PPO | Source: Ambulatory Visit | Attending: Nephrology | Admitting: Nephrology

## 2015-05-15 DIAGNOSIS — D593 Hemolytic-uremic syndrome: Secondary | ICD-10-CM | POA: Diagnosis present

## 2015-05-15 MED ORDER — SODIUM CHLORIDE 0.9 % IV SOLN
1200.0000 mg | INTRAVENOUS | Status: DC
  Administered 2015-05-15: 1200 mg via INTRAVENOUS
  Filled 2015-05-15: qty 120

## 2015-05-16 ENCOUNTER — Encounter (HOSPITAL_COMMUNITY): Payer: BC Managed Care – PPO

## 2015-05-30 ENCOUNTER — Encounter (HOSPITAL_COMMUNITY)
Admission: RE | Admit: 2015-05-30 | Discharge: 2015-05-30 | Disposition: A | Discharge status: Home / Self Care | Payer: BC Managed Care – PPO | Source: Ambulatory Visit | Attending: Nephrology | Admitting: Nephrology

## 2015-05-30 DIAGNOSIS — D593 Hemolytic-uremic syndrome: Secondary | ICD-10-CM | POA: Insufficient documentation

## 2015-05-30 MED ORDER — SODIUM CHLORIDE 0.9 % IV SOLN
1200.0000 mg | INTRAVENOUS | Status: DC
  Administered 2015-05-30: 1200 mg via INTRAVENOUS
  Filled 2015-05-30: qty 120

## 2015-05-30 MED ORDER — LORATADINE 10 MG PO TABS
ORAL_TABLET | ORAL | Status: DC
Start: 2015-05-30 — End: 2015-05-30
  Filled 2015-05-30: qty 1

## 2015-05-30 MED ORDER — METHYLPREDNISOLONE SODIUM SUCC 40 MG IJ SOLR
INTRAMUSCULAR | Status: AC
  Filled 2015-05-30: qty 1

## 2015-05-30 MED ORDER — SODIUM CHLORIDE 0.9 % IV SOLN
Freq: Once | INTRAVENOUS | Status: AC
  Administered 2015-05-30: 09:00:00 via INTRAVENOUS

## 2015-06-12 ENCOUNTER — Other Ambulatory Visit (HOSPITAL_COMMUNITY): Payer: Self-pay | Admitting: *Deleted

## 2015-06-13 ENCOUNTER — Encounter (HOSPITAL_COMMUNITY)
Admission: RE | Admit: 2015-06-13 | Discharge: 2015-06-13 | Disposition: A | Discharge status: Home / Self Care | Payer: BC Managed Care – PPO | Source: Ambulatory Visit | Attending: Nephrology | Admitting: Nephrology

## 2015-06-13 DIAGNOSIS — D593 Hemolytic-uremic syndrome: Secondary | ICD-10-CM | POA: Insufficient documentation

## 2015-06-13 MED ORDER — SODIUM CHLORIDE 0.9 % IV SOLN: Freq: Once | INTRAVENOUS | Status: DC

## 2015-06-13 MED ORDER — SODIUM CHLORIDE 0.9 % IV SOLN
1200.0000 mg | INTRAVENOUS | Status: DC
  Administered 2015-06-13: 1200 mg via INTRAVENOUS
  Filled 2015-06-13: qty 120

## 2015-06-18 ENCOUNTER — Other Ambulatory Visit (HOSPITAL_COMMUNITY)
Admission: RE | Admit: 2015-06-18 | Discharge: 2015-06-18 | Disposition: A | Discharge status: Home / Self Care | Payer: BC Managed Care – PPO | Source: Ambulatory Visit | Attending: Family Medicine | Admitting: Family Medicine

## 2015-06-18 ENCOUNTER — Other Ambulatory Visit: Payer: Self-pay | Admitting: Family Medicine

## 2015-06-18 DIAGNOSIS — Z01419 Encounter for gynecological examination (general) (routine) without abnormal findings: Secondary | ICD-10-CM | POA: Diagnosis present

## 2015-06-18 DIAGNOSIS — Z1151 Encounter for screening for human papillomavirus (HPV): Secondary | ICD-10-CM | POA: Diagnosis present

## 2015-06-19 LAB — CYTOLOGY - PAP

## 2015-06-26 ENCOUNTER — Other Ambulatory Visit (HOSPITAL_COMMUNITY): Payer: Self-pay | Admitting: *Deleted

## 2015-06-27 ENCOUNTER — Encounter (HOSPITAL_COMMUNITY)
Admission: RE | Admit: 2015-06-27 | Discharge: 2015-06-27 | Disposition: A | Discharge status: Home / Self Care | Payer: BC Managed Care – PPO | Source: Ambulatory Visit | Attending: Nephrology | Admitting: Nephrology

## 2015-06-27 DIAGNOSIS — D593 Hemolytic-uremic syndrome: Secondary | ICD-10-CM | POA: Diagnosis not present

## 2015-06-27 MED ORDER — SODIUM CHLORIDE 0.9 % IV SOLN
1200.0000 mg | Freq: Once | INTRAVENOUS | Status: DC
  Administered 2015-06-27: 1200 mg via INTRAVENOUS
  Filled 2015-06-27: qty 120

## 2015-07-11 ENCOUNTER — Encounter (HOSPITAL_COMMUNITY)
Admission: RE | Admit: 2015-07-11 | Discharge: 2015-07-11 | Disposition: A | Discharge status: Home / Self Care | Payer: BC Managed Care – PPO | Source: Ambulatory Visit | Attending: Nephrology | Admitting: Nephrology

## 2015-07-11 DIAGNOSIS — Z1231 Encounter for screening mammogram for malignant neoplasm of breast: Secondary | ICD-10-CM | POA: Diagnosis not present

## 2015-07-11 DIAGNOSIS — D593 Hemolytic-uremic syndrome: Secondary | ICD-10-CM | POA: Diagnosis present

## 2015-07-11 MED ORDER — SODIUM CHLORIDE 0.9 % IV SOLN
1200.0000 mg | Freq: Once | INTRAVENOUS | Status: AC
  Administered 2015-07-11: 1200 mg via INTRAVENOUS
  Filled 2015-07-11: qty 120

## 2015-07-11 MED ORDER — SODIUM CHLORIDE 0.9 % IV SOLN
INTRAVENOUS | Status: DC
  Administered 2015-07-11 (×2): via INTRAVENOUS

## 2015-07-20 ENCOUNTER — Encounter (HOSPITAL_COMMUNITY): Payer: Self-pay | Admitting: Neurology

## 2015-07-20 ENCOUNTER — Emergency Department (HOSPITAL_COMMUNITY)
Admission: EM | Admit: 2015-07-20 | Discharge: 2015-07-20 | Disposition: A | Discharge status: Home / Self Care | Payer: BC Managed Care – PPO | Attending: Emergency Medicine | Admitting: Emergency Medicine

## 2015-07-20 DIAGNOSIS — I129 Hypertensive chronic kidney disease with stage 1 through stage 4 chronic kidney disease, or unspecified chronic kidney disease: Secondary | ICD-10-CM | POA: Diagnosis not present

## 2015-07-20 DIAGNOSIS — D649 Anemia, unspecified: Secondary | ICD-10-CM | POA: Diagnosis present

## 2015-07-20 DIAGNOSIS — Z79899 Other long term (current) drug therapy: Secondary | ICD-10-CM | POA: Diagnosis not present

## 2015-07-20 DIAGNOSIS — N189 Chronic kidney disease, unspecified: Secondary | ICD-10-CM | POA: Insufficient documentation

## 2015-07-20 LAB — COMPREHENSIVE METABOLIC PANEL
ALT: 19 U/L (ref 14–54)
AST: 28 U/L (ref 15–41)
Albumin: 3.6 g/dL (ref 3.5–5.0)
Alkaline Phosphatase: 90 U/L (ref 38–126)
Anion gap: 14 (ref 5–15)
BILIRUBIN TOTAL: 0.6 mg/dL (ref 0.3–1.2)
BUN: 25 mg/dL — AB (ref 6–20)
CHLORIDE: 89 mmol/L — AB (ref 101–111)
CO2: 29 mmol/L (ref 22–32)
Calcium: 9.4 mg/dL (ref 8.9–10.3)
Creatinine, Ser: 4.88 mg/dL — ABNORMAL HIGH (ref 0.44–1.00)
GFR, EST AFRICAN AMERICAN: 10 mL/min — AB (ref >60)
GFR, EST NON AFRICAN AMERICAN: 9 mL/min — AB (ref >60)
Glucose, Bld: 93 mg/dL (ref 65–99)
POTASSIUM: 3.6 mmol/L (ref 3.5–5.1)
Sodium: 132 mmol/L — ABNORMAL LOW (ref 135–145)
TOTAL PROTEIN: 6.4 g/dL — AB (ref 6.5–8.1)

## 2015-07-20 LAB — TYPE AND SCREEN
ABO/RH(D): O POS
Antibody Screen: NEGATIVE

## 2015-07-20 LAB — CBC WITH DIFFERENTIAL/PLATELET
BASOS ABS: 0 10*3/uL (ref 0.0–0.1)
Basophils Relative: 0 %
EOS PCT: 0 %
Eosinophils Absolute: 0 10*3/uL (ref 0.0–0.7)
HCT: 25.2 % — ABNORMAL LOW (ref 36.0–46.0)
Hemoglobin: 8.2 g/dL — ABNORMAL LOW (ref 12.0–15.0)
LYMPHS ABS: 1.1 10*3/uL (ref 0.7–4.0)
LYMPHS PCT: 17 %
MCH: 30.9 pg (ref 26.0–34.0)
MCHC: 32.5 g/dL (ref 30.0–36.0)
MCV: 95.1 fL (ref 78.0–100.0)
MONO ABS: 1.1 10*3/uL — AB (ref 0.1–1.0)
Monocytes Relative: 15 %
NEUTROS PCT: 68 %
Neutro Abs: 4.6 10*3/uL (ref 1.7–7.7)
PLATELETS: 247 10*3/uL (ref 150–400)
RBC: 2.65 MIL/uL — ABNORMAL LOW (ref 3.87–5.11)
RDW: 14.6 % (ref 11.5–15.5)
WBC: 6.8 10*3/uL (ref 4.0–10.5)

## 2015-07-20 NOTE — ED Notes (Signed)
Pt is hemodialysis pt told her HGB was 7.3 from her dialysis yesterday. Tues, thurs, sat dialysis pt. Reports dizziness, weakness anytime she gets up. Reports also feeling constipated. Denies blood in stool.

## 2015-07-20 NOTE — ED Provider Notes (Signed)
CSN: [id number]     Arrival date & time 07/20/15  [id number] History   First MD Initiated Contact with Patient 07/20/15 1005     Chief Complaint  Patient presents with  . Abnormal Lab    HPI Patient was sent in for anemia. Reportedly hemoglobin is 7.3 yesterday at dialysis. She has a history of recurrent anemia. She's been on epo shots for this. Last one about 6 weeks ago. She has had stool cards is not been able on the mania. No black stool. States she's been more shortness of breath and fatigue the last few days. States she is able to do things but she has to do them quickly otherwise she gets tired out quickly.   Past Medical History  Diagnosis Date  . Wears glasses   . Osteopenia   . Seasonal allergies   . PONV (postoperative nausea and vomiting)   . Anemia   . Hypertension   . Chronic kidney disease     dialysis - t/th/sa  . GERD (gastroesophageal reflux disease)   . Rheumatoid arthritis ([place])   . Constipation   . Family history of adverse reaction to anesthesia     mom and sister has n/v   Past Surgical History  Procedure Laterality Date  . Tonsillectomy    . Colonoscopy    . Tumor excision  2000    right foot  . Carpal tunnel release Right 04/05/2014    Procedure: RIGHT CARPAL TUNNEL RELEASE;  Surgeon: Daryll Brod, MD;  Location: [place];  Service: Orthopedics;  Laterality: Right;  ANESTHESIA:  IV REGIONAL FAB  . Av fistula placement Left 11/18/2014    Procedure: BRACHIOCEPHALIC ARTERIOVENOUS (AV) FISTULA CREATION;  Surgeon: Conrad Catawba, MD;  Location: [facility] OR;  Service: Vascular;  Laterality: Left;   Family History  Problem Relation Age of Onset  . Hypertension Mother   . Cervical cancer Mother   . Cancer Mother   . Hyperlipidemia Mother   . Hypertension Father   . Stroke Father   . Hyperlipidemia Father   . Rheum arthritis Other    Social History  Substance Use Topics  . Smoking status: Never Smoker   . Smokeless tobacco: Never Used  . Alcohol  Use: No     Comment:  no   OB History    No data available     Review of Systems  Constitutional: Positive for fever. Negative for activity change, appetite change and unexpected weight change.  HENT: Negative for congestion.   Eyes: Negative for pain.  Respiratory: Positive for shortness of breath. Negative for chest tightness.   Cardiovascular: Negative for chest pain and leg swelling.  Gastrointestinal: Negative for nausea, vomiting, abdominal pain and diarrhea.  Genitourinary: Negative for flank pain.  Musculoskeletal: Negative for back pain, joint swelling and neck stiffness.  Skin: Positive for pallor. Negative for rash.  Neurological: Negative for weakness, numbness and headaches.  Psychiatric/Behavioral: Negative for behavioral problems.      Allergies  Review of patient's allergies indicates no known allergies.  Home Medications   Prior to Admission medications   Medication Sig Start Date End Date Taking? Authorizing Provider  acetaminophen (TYLENOL) 500 MG tablet Take 1,000 mg by mouth every 8 (eight) hours as needed for headache.   Yes Historical Provider, MD  cloNIDine (CATAPRES) 0.1 MG tablet Take 1 tablet (0.1 mg total) by mouth 3 (three) times daily. 08/09/14  Yes Velvet Bathe, MD  hydrALAZINE (APRESOLINE) 50 MG tablet Take 1 tablet (  50 mg total) by mouth every 8 (eight) hours. Patient taking differently: Take 50 mg by mouth 2 (two) times daily.  08/09/14  Yes Velvet Bathe, MD  hydroxychloroquine (PLAQUENIL) 200 MG tablet Take 400 mg by mouth daily.    Yes Historical Provider, MD  labetalol (NORMODYNE) 200 MG tablet Take 100 mg by mouth 2 (two) times daily.  10/23/14  Yes Historical Provider, MD  meclizine (ANTIVERT) 12.5 MG tablet Take 12.5 mg by mouth 2 (two) times daily as needed for dizziness.  09/02/14  Yes Historical Provider, MD  multivitamin (RENA-VIT) TABS tablet Take 1 tablet by mouth at bedtime. 08/09/14  Yes Velvet Bathe, MD  omeprazole (PRILOSEC OTC) 20 MG  tablet Take 20 mg by mouth 2 (two) times daily.    Yes Historical Provider, MD  ondansetron (ZOFRAN) 8 MG tablet Take 8 mg by mouth every 8 (eight) hours as needed for nausea or vomiting.  09/03/14  Yes Historical Provider, MD  oxyCODONE (ROXICODONE) 5 MG immediate release tablet Take 1 tablet (5 mg total) by mouth every 6 (six) hours as needed. 11/18/14  Yes Samantha J Rhyne, PA-C  sevelamer carbonate (RENVELA) 800 MG tablet Take 1,600 mg by mouth 3 (three) times daily with meals. Take med when eating protein - up to 4 times daily   Yes Historical Provider, MD   BP 129/73 mmHg  Pulse 73  Temp(Src) 98.4 F (36.9 C) (Oral)  Resp 17  SpO2 100% Physical Exam  Constitutional: She is oriented to person, place, and time. She appears well-developed and well-nourished.  HENT:  Head: Normocephalic and atraumatic.  Eyes: EOM are normal. Pupils are equal, round, and reactive to light.  Neck: Normal range of motion. Neck supple.  Cardiovascular: Normal rate, regular rhythm and normal heart sounds.   No murmur heard. Pulmonary/Chest: Effort normal and breath sounds normal. No respiratory distress. She has no wheezes. She has no rales.  Abdominal: Soft. Bowel sounds are normal. She exhibits no distension. There is no tenderness. There is no rebound and no guarding.  Musculoskeletal: Normal range of motion.  Dialysis site to left upper extremity.  Neurological: She is alert and oriented to person, place, and time. No cranial nerve deficit.  Skin: Skin is warm and dry. There is pallor.  Psychiatric: She has a normal mood and affect. Her speech is normal.  Nursing note and vitals reviewed.   ED Course  Procedures (including critical care time) Labs Review Labs Reviewed  COMPREHENSIVE METABOLIC PANEL - Abnormal; Notable for the following:    Sodium 132 (*)    Chloride 89 (*)    BUN 25 (*)    Creatinine, Ser 4.88 (*)    Total Protein 6.4 (*)    GFR calc non Af Amer 9 (*)    GFR calc Af Amer 10  (*)    All other components within normal limits  CBC WITH DIFFERENTIAL/PLATELET - Abnormal; Notable for the following:    RBC 2.65 (*)    Hemoglobin 8.2 (*)    HCT 25.2 (*)    Monocytes Absolute 1.1 (*)    All other components within normal limits  POC OCCULT BLOOD, ED  TYPE AND SCREEN    Imaging Review No results found. I have personally reviewed and evaluated these images and lab results as part of my medical decision-making.   EKG Interpretation None      MDM   Final diagnoses:  Anemia, unspecified anemia type    Patient with anemia. History of same.  Hemoglobin 4 days ago was 7.3. Today is above 8. Discussed with Dr. Vanita Panda. Does not need emergent transfusion. Discharge home.    Davonna Belling, MD 07/20/15 1228

## 2015-07-20 NOTE — Discharge Instructions (Signed)
Anemia, Nonspecific Anemia is a condition in which the concentration of red blood cells or hemoglobin in the blood is below normal. Hemoglobin is a substance in red blood cells that carries oxygen to the tissues of the body. Anemia results in not enough oxygen reaching these tissues.  CAUSES  Common causes of anemia include:   Excessive bleeding. Bleeding may be internal or external. This includes excessive bleeding from periods (in women) or from the intestine.   Poor nutrition.   Chronic kidney, thyroid, and liver disease.  Bone marrow disorders that decrease red blood cell production.  Cancer and treatments for cancer.  HIV, AIDS, and their treatments.  Spleen problems that increase red blood cell destruction.  Blood disorders.  Excess destruction of red blood cells due to infection, medicines, and autoimmune disorders. SIGNS AND SYMPTOMS   Minor weakness.   Dizziness.   Headache.  Palpitations.   Shortness of breath, especially with exercise.   Paleness.  Cold sensitivity.  Indigestion.  Nausea.  Difficulty sleeping.  Difficulty concentrating. Symptoms may occur suddenly or they may develop slowly.  DIAGNOSIS  Additional blood tests are often needed. These help your health care provider determine the best treatment. Your health care provider will check your stool for blood and look for other causes of blood loss.  TREATMENT  Treatment varies depending on the cause of the anemia. Treatment can include:   Supplements of iron, vitamin B12, or folic acid.   Hormone medicines.   A blood transfusion. This may be needed if blood loss is severe.   Hospitalization. This may be needed if there is significant continual blood loss.   Dietary changes.  Spleen removal. HOME CARE INSTRUCTIONS Keep all follow-up appointments. It often takes many weeks to correct anemia, and having your health care provider check on your condition and your response to  treatment is very important. SEEK IMMEDIATE MEDICAL CARE IF:   You develop extreme weakness, shortness of breath, or chest pain.   You become dizzy or have trouble concentrating.  You develop heavy vaginal bleeding.   You develop a rash.   You have bloody or black, tarry stools.   You faint.   You vomit up blood.   You vomit repeatedly.   You have abdominal pain.  You have a fever or persistent symptoms for more than 2-3 days.   You have a fever and your symptoms suddenly get worse.   You are dehydrated.  MAKE SURE YOU:  Understand these instructions.  Will watch your condition.  Will get help right away if you are not doing well or get worse.   This information is not intended to replace advice given to you by your health care provider. Make sure you discuss any questions you have with your health care provider.   Document Released: 01/29/2004 Document Revised: 08/23/2012 Document Reviewed: 06/16/2012 Elsevier Interactive Patient Education 2016 [employer]  

## 2015-07-24 ENCOUNTER — Other Ambulatory Visit (HOSPITAL_COMMUNITY): Payer: Self-pay | Admitting: *Deleted

## 2015-07-25 ENCOUNTER — Observation Stay (HOSPITAL_COMMUNITY)
Admission: EM | Admit: 2015-07-25 | Discharge: 2015-07-26 | Disposition: A | Discharge status: Home / Self Care | Payer: BC Managed Care – PPO | Attending: Emergency Medicine | Admitting: Emergency Medicine

## 2015-07-25 ENCOUNTER — Encounter (HOSPITAL_COMMUNITY)
Admission: RE | Admit: 2015-07-25 | Discharge: 2015-07-25 | Disposition: A | Discharge status: Home / Self Care | Payer: BC Managed Care – PPO | Source: Ambulatory Visit | Attending: Nephrology | Admitting: Nephrology

## 2015-07-25 ENCOUNTER — Encounter (HOSPITAL_COMMUNITY): Payer: Self-pay | Admitting: *Deleted

## 2015-07-25 DIAGNOSIS — Z79899 Other long term (current) drug therapy: Secondary | ICD-10-CM | POA: Diagnosis not present

## 2015-07-25 DIAGNOSIS — Z992 Dependence on renal dialysis: Secondary | ICD-10-CM | POA: Diagnosis not present

## 2015-07-25 DIAGNOSIS — D5939 Other hemolytic-uremic syndrome: Secondary | ICD-10-CM

## 2015-07-25 DIAGNOSIS — D593 Hemolytic-uremic syndrome: Secondary | ICD-10-CM | POA: Diagnosis not present

## 2015-07-25 DIAGNOSIS — M069 Rheumatoid arthritis, unspecified: Secondary | ICD-10-CM | POA: Diagnosis present

## 2015-07-25 DIAGNOSIS — D539 Nutritional anemia, unspecified: Secondary | ICD-10-CM | POA: Diagnosis present

## 2015-07-25 DIAGNOSIS — I120 Hypertensive chronic kidney disease with stage 5 chronic kidney disease or end stage renal disease: Secondary | ICD-10-CM | POA: Diagnosis not present

## 2015-07-25 DIAGNOSIS — D649 Anemia, unspecified: Secondary | ICD-10-CM | POA: Diagnosis not present

## 2015-07-25 DIAGNOSIS — N186 End stage renal disease: Secondary | ICD-10-CM | POA: Diagnosis not present

## 2015-07-25 DIAGNOSIS — R0602 Shortness of breath: Secondary | ICD-10-CM

## 2015-07-25 DIAGNOSIS — R7989 Other specified abnormal findings of blood chemistry: Secondary | ICD-10-CM | POA: Diagnosis present

## 2015-07-25 DIAGNOSIS — I10 Essential (primary) hypertension: Secondary | ICD-10-CM | POA: Diagnosis present

## 2015-07-25 DIAGNOSIS — K219 Gastro-esophageal reflux disease without esophagitis: Secondary | ICD-10-CM | POA: Diagnosis present

## 2015-07-25 LAB — CBC
HEMATOCRIT: 24.9 % — AB (ref 36.0–46.0)
HEMOGLOBIN: 7.7 g/dL — AB (ref 12.0–15.0)
MCH: 31.2 pg (ref 26.0–34.0)
MCHC: 30.9 g/dL (ref 30.0–36.0)
MCV: 100.8 fL — ABNORMAL HIGH (ref 78.0–100.0)
Platelets: 261 10*3/uL (ref 150–400)
RBC: 2.47 MIL/uL — ABNORMAL LOW (ref 3.87–5.11)
RDW: 18.5 % — ABNORMAL HIGH (ref 11.5–15.5)
WBC: 7.4 10*3/uL (ref 4.0–10.5)

## 2015-07-25 LAB — COMPREHENSIVE METABOLIC PANEL
ALBUMIN: 3.8 g/dL (ref 3.5–5.0)
ALK PHOS: 84 U/L (ref 38–126)
ALT: 19 U/L (ref 14–54)
ANION GAP: 12 (ref 5–15)
AST: 28 U/L (ref 15–41)
BILIRUBIN TOTAL: 0.5 mg/dL (ref 0.3–1.2)
BUN: 36 mg/dL — ABNORMAL HIGH (ref 6–20)
CALCIUM: 9.2 mg/dL (ref 8.9–10.3)
CO2: 30 mmol/L (ref 22–32)
Chloride: 93 mmol/L — ABNORMAL LOW (ref 101–111)
Creatinine, Ser: 5.93 mg/dL — ABNORMAL HIGH (ref 0.44–1.00)
GFR calc non Af Amer: 7 mL/min — ABNORMAL LOW (ref >60)
GFR, EST AFRICAN AMERICAN: 8 mL/min — AB (ref >60)
GLUCOSE: 99 mg/dL (ref 65–99)
Potassium: 4.1 mmol/L (ref 3.5–5.1)
Sodium: 135 mmol/L (ref 135–145)
TOTAL PROTEIN: 6.4 g/dL — AB (ref 6.5–8.1)

## 2015-07-25 LAB — PREPARE RBC (CROSSMATCH)

## 2015-07-25 MED ORDER — SODIUM CHLORIDE 0.9 % IV SOLN
1200.0000 mg | Freq: Once | INTRAVENOUS | Status: AC
  Administered 2015-07-25: 1200 mg via INTRAVENOUS
  Filled 2015-07-25: qty 120

## 2015-07-25 MED ORDER — SODIUM CHLORIDE 0.9 % IV SOLN: Freq: Once | INTRAVENOUS | Status: DC

## 2015-07-25 MED ORDER — SODIUM CHLORIDE 0.9 % IV SOLN
INTRAVENOUS | Status: DC
  Administered 2015-07-25: 09:00:00 via INTRAVENOUS

## 2015-07-25 NOTE — ED Notes (Addendum)
Patient's labs- CBC and CMP have not resulted.  Spoke with main lab that states they did not receive blood.  However, type and screen has resulted.  Labs reordered.

## 2015-07-25 NOTE — ED Provider Notes (Signed)
CSN: [id number]     Arrival date & time 07/25/15  1821 History   First MD Initiated Contact with Patient 07/25/15 2215     Chief Complaint  Patient presents with  . Abnormal Lab     (Consider location/radiation/quality/duration/timing/severity/associated sxs/prior Treatment) HPI Comments: Patient with history of end-stage renal disease -- presents after being sent by her nephrologist to the emergency department for a blood transfusion. Patient states that she has been feeling very weak and generally fatigued over the past several weeks. She receives Epo injections. She had a hemoglobin around 7 last week and was sent to the emergency department. She was discharged to home when her hemoglobin here was 8.2. Yesterday her hemoglobin was checked and it was 7.0. Rechecked tonight and it is 7.7. Patient had a normal dialysis session yesterday. She is due to have dialysis at 12:30 PM tomorrow. Patient dialyzes through left upper extremity fistula. Denies any bleeding symptoms. No history of blood transfusions.  The history is provided by the patient and medical records.    Past Medical History  Diagnosis Date  . Wears glasses   . Osteopenia   . Seasonal allergies   . PONV (postoperative nausea and vomiting)   . Anemia   . Hypertension   . Chronic kidney disease     dialysis - t/th/sa  . GERD (gastroesophageal reflux disease)   . Rheumatoid arthritis ([place])   . Constipation   . Family history of adverse reaction to anesthesia     mom and sister has n/v   Past Surgical History  Procedure Laterality Date  . Tonsillectomy    . Colonoscopy    . Tumor excision  2000    right foot  . Carpal tunnel release Right 04/05/2014    Procedure: RIGHT CARPAL TUNNEL RELEASE;  Surgeon: Daryll Brod, MD;  Location: [place];  Service: Orthopedics;  Laterality: Right;  ANESTHESIA:  IV REGIONAL FAB  . Av fistula placement Left 11/18/2014    Procedure: BRACHIOCEPHALIC ARTERIOVENOUS (AV) FISTULA  CREATION;  Surgeon: Conrad [place]o [place], MD;  Location: [facility] OR;  Service: Vascular;  Laterality: Left;   Family History  Problem Relation Age of Onset  . Hypertension Mother   . Cervical cancer Mother   . Cancer Mother   . Hyperlipidemia Mother   . Hypertension Father   . Stroke Father   . Hyperlipidemia Father   . Rheum arthritis Other    Social History  Substance Use Topics  . Smoking status: Never Smoker   . Smokeless tobacco: Never Used  . Alcohol Use: No     Comment:  no   OB History    No data available     Review of Systems  Constitutional: Positive for fatigue. Negative for fever.  HENT: Negative for rhinorrhea and sore throat.   Eyes: Negative for redness.  Respiratory: Negative for cough.   Cardiovascular: Negative for chest pain.  Gastrointestinal: Negative for nausea, vomiting, abdominal pain and diarrhea.  Genitourinary: Negative for dysuria.  Musculoskeletal: Negative for myalgias.  Skin: Negative for rash.  Neurological: Positive for weakness (Generalized). Negative for headaches.    Allergies  Review of patient's allergies indicates no known allergies.  Home Medications   Prior to Admission medications   Medication Sig Start Date End Date Taking? Authorizing Provider  acetaminophen (TYLENOL) 500 MG tablet Take 1,000 mg by mouth every 8 (eight) hours as needed for headache.    Historical Provider, MD  cloNIDine (CATAPRES) 0.1 MG tablet Take 1  tablet (0.1 mg total) by mouth 3 (three) times daily. 08/09/14   Velvet Bathe, MD  hydrALAZINE (APRESOLINE) 50 MG tablet Take 1 tablet (50 mg total) by mouth every 8 (eight) hours. Patient taking differently: Take 50 mg by mouth 2 (two) times daily.  08/09/14   Velvet Bathe, MD  hydroxychloroquine (PLAQUENIL) 200 MG tablet Take 400 mg by mouth daily.     Historical Provider, MD  labetalol (NORMODYNE) 200 MG tablet Take 100 mg by mouth 2 (two) times daily.  10/23/14   Historical Provider, MD  meclizine (ANTIVERT) 12.5 MG  tablet Take 12.5 mg by mouth 2 (two) times daily as needed for dizziness.  09/02/14   Historical Provider, MD  multivitamin (RENA-VIT) TABS tablet Take 1 tablet by mouth at bedtime. 08/09/14   Velvet Bathe, MD  omeprazole (PRILOSEC OTC) 20 MG tablet Take 20 mg by mouth 2 (two) times daily.     Historical Provider, MD  ondansetron (ZOFRAN) 8 MG tablet Take 8 mg by mouth every 8 (eight) hours as needed for nausea or vomiting.  09/03/14   Historical Provider, MD  oxyCODONE (ROXICODONE) 5 MG immediate release tablet Take 1 tablet (5 mg total) by mouth every 6 (six) hours as needed. 11/18/14   Samantha J Rhyne, PA-C  sevelamer carbonate (RENVELA) 800 MG tablet Take 1,600 mg by mouth 3 (three) times daily with meals. Take med when eating protein - up to 4 times daily    Historical Provider, MD   BP 169/78 mmHg  Pulse 73  Temp(Src) 99.2 F (37.3 C) (Oral)  Resp 18  SpO2 100%   Physical Exam  Constitutional: She appears well-developed and well-nourished.  HENT:  Head: Normocephalic and atraumatic.  Eyes: Conjunctivae are normal. Right eye exhibits no discharge. Left eye exhibits no discharge.  Neck: Normal range of motion. Neck supple. No JVD present.  Cardiovascular: Normal rate and regular rhythm.   Murmur (III/VI holosystolic) heard. L UE fistula.   Pulmonary/Chest: Effort normal and breath sounds normal. No respiratory distress. She has no wheezes. She has no rales.  Abdominal: Soft. There is no tenderness.  Musculoskeletal: She exhibits no edema or tenderness.  No LE edema.   Neurological: She is alert.  Skin: Skin is warm and dry. There is pallor.  Psychiatric: She has a normal mood and affect.  Nursing note and vitals reviewed.   ED Course  Procedures (including critical care time) Labs Review Labs Reviewed  CBC - Abnormal; Notable for the following:    RBC 2.47 (*)    Hemoglobin 7.7 (*)    HCT 24.9 (*)    MCV 100.8 (*)    RDW 18.5 (*)    All other components within normal  limits  COMPREHENSIVE METABOLIC PANEL - Abnormal; Notable for the following:    Chloride 93 (*)    BUN 36 (*)    Creatinine, Ser 5.93 (*)    Total Protein 6.4 (*)    GFR calc non Af Amer 7 (*)    GFR calc Af Amer 8 (*)    All other components within normal limits  POC OCCULT BLOOD, ED  TYPE AND SCREEN  PREPARE RBC (CROSSMATCH)    11:10 PM Patient seen and examined. Discussed with Dr. Leonides Schanz. Patient does not appear to be fluid overloaded. Will admit for transfusion. Patient requested that I be aware she is due to dialysis tomorrow. I told her I would let her nephrologist know that she is here.   Vital signs reviewed and  are as follows: BP 169/78 mmHg  Pulse 73  Temp(Src) 99.2 F (37.3 C) (Oral)  Resp 18  SpO2 100%  12:39 AM Spoke with Dr. Myna Hidalgo who will admit.   I paged nephrology earlier to inform them that patient may need dialysis tomorrow -- I did not receive a call back.   MDM   Final diagnoses:  Symptomatic anemia  ESRD (end stage renal disease) ([place])   Admit.    Carlisle Cater, PA-C 07/26/15 [place], DO 07/26/15 [phone]

## 2015-07-25 NOTE — ED Notes (Signed)
Pt reports having dialysis yesterday. Was called and told to come here today due to hgb 7.0. No acute distress noted at this time.

## 2015-07-26 ENCOUNTER — Encounter (HOSPITAL_COMMUNITY): Payer: Self-pay | Admitting: Family Medicine

## 2015-07-26 DIAGNOSIS — D539 Nutritional anemia, unspecified: Secondary | ICD-10-CM | POA: Diagnosis present

## 2015-07-26 DIAGNOSIS — N186 End stage renal disease: Secondary | ICD-10-CM | POA: Insufficient documentation

## 2015-07-26 DIAGNOSIS — Z1589 Genetic susceptibility to other disease: Secondary | ICD-10-CM

## 2015-07-26 DIAGNOSIS — D649 Anemia, unspecified: Secondary | ICD-10-CM | POA: Diagnosis not present

## 2015-07-26 DIAGNOSIS — D593 Hemolytic-uremic syndrome: Secondary | ICD-10-CM

## 2015-07-26 DIAGNOSIS — I10 Essential (primary) hypertension: Secondary | ICD-10-CM

## 2015-07-26 DIAGNOSIS — Z992 Dependence on renal dialysis: Secondary | ICD-10-CM

## 2015-07-26 DIAGNOSIS — K219 Gastro-esophageal reflux disease without esophagitis: Secondary | ICD-10-CM | POA: Diagnosis present

## 2015-07-26 DIAGNOSIS — M069 Rheumatoid arthritis, unspecified: Secondary | ICD-10-CM

## 2015-07-26 LAB — BASIC METABOLIC PANEL
ANION GAP: 13 (ref 5–15)
BUN: 40 mg/dL — ABNORMAL HIGH (ref 6–20)
CALCIUM: 8.9 mg/dL (ref 8.9–10.3)
CHLORIDE: 94 mmol/L — AB (ref 101–111)
CO2: 30 mmol/L (ref 22–32)
CREATININE: 6.79 mg/dL — AB (ref 0.44–1.00)
GFR calc non Af Amer: 6 mL/min — ABNORMAL LOW (ref >60)
GFR, EST AFRICAN AMERICAN: 7 mL/min — AB (ref >60)
Glucose, Bld: 88 mg/dL (ref 65–99)
Potassium: 4.1 mmol/L (ref 3.5–5.1)
Sodium: 137 mmol/L (ref 135–145)

## 2015-07-26 LAB — HEMOGLOBIN AND HEMATOCRIT, BLOOD
HEMATOCRIT: 26.7 % — AB (ref 36.0–46.0)
HEMOGLOBIN: 8.5 g/dL — AB (ref 12.0–15.0)

## 2015-07-26 MED ORDER — POLYETHYLENE GLYCOL 3350 17 G PO PACK: 17.0000 g | PACK | Freq: Every day | ORAL | Status: DC | PRN

## 2015-07-26 MED ORDER — DARBEPOETIN ALFA 200 MCG/0.4ML IJ SOSY: 200.0000 ug | PREFILLED_SYRINGE | INTRAMUSCULAR | Status: DC

## 2015-07-26 MED ORDER — DOXERCALCIFEROL 4 MCG/2ML IV SOLN
2.0000 ug | INTRAVENOUS | Status: DC
  Filled 2015-07-26: qty 2

## 2015-07-26 MED ORDER — HYDROXYCHLOROQUINE SULFATE 200 MG PO TABS
400.0000 mg | ORAL_TABLET | Freq: Every day | ORAL | Status: DC
  Administered 2015-07-26: 400 mg via ORAL
  Filled 2015-07-26: qty 2

## 2015-07-26 MED ORDER — LABETALOL HCL 100 MG PO TABS
100.0000 mg | ORAL_TABLET | Freq: Two times a day (BID) | ORAL | Status: DC
  Administered 2015-07-26: 100 mg via ORAL
  Filled 2015-07-26 (×2): qty 1

## 2015-07-26 MED ORDER — ONDANSETRON HCL 4 MG PO TABS: 8.0000 mg | ORAL_TABLET | Freq: Three times a day (TID) | ORAL | Status: DC | PRN

## 2015-07-26 MED ORDER — PANTOPRAZOLE SODIUM 40 MG PO TBEC: 40.0000 mg | DELAYED_RELEASE_TABLET | Freq: Two times a day (BID) | ORAL | Status: DC

## 2015-07-26 MED ORDER — ACETAMINOPHEN 500 MG PO TABS: 1000.0000 mg | ORAL_TABLET | Freq: Three times a day (TID) | ORAL | Status: DC | PRN

## 2015-07-26 MED ORDER — SODIUM CHLORIDE 0.9% FLUSH: 3.0000 mL | Freq: Two times a day (BID) | INTRAVENOUS | Status: DC

## 2015-07-26 MED ORDER — SODIUM CHLORIDE 0.9 % IV SOLN: 250.0000 mL | INTRAVENOUS | Status: DC | PRN

## 2015-07-26 MED ORDER — SODIUM CHLORIDE 0.9% FLUSH: 3.0000 mL | INTRAVENOUS | Status: DC | PRN

## 2015-07-26 MED ORDER — SODIUM CHLORIDE 0.9% FLUSH
3.0000 mL | Freq: Two times a day (BID) | INTRAVENOUS | Status: DC
  Administered 2015-07-26: 3 mL via INTRAVENOUS

## 2015-07-26 MED ORDER — SEVELAMER CARBONATE 800 MG PO TABS
1600.0000 mg | ORAL_TABLET | Freq: Three times a day (TID) | ORAL | Status: DC
  Filled 2015-07-26: qty 2

## 2015-07-26 MED ORDER — PANTOPRAZOLE SODIUM 40 MG PO TBEC
40.0000 mg | DELAYED_RELEASE_TABLET | Freq: Every day | ORAL | Status: DC
Start: 2015-07-26 — End: 2015-07-26

## 2015-07-26 MED ORDER — OMEPRAZOLE 20 MG PO CPDR
20.0000 mg | DELAYED_RELEASE_CAPSULE | Freq: Two times a day (BID) | ORAL | Status: DC
  Administered 2015-07-26: 20 mg via ORAL
  Filled 2015-07-26: qty 1

## 2015-07-26 MED ORDER — CLONIDINE HCL 0.1 MG PO TABS
0.1000 mg | ORAL_TABLET | Freq: Three times a day (TID) | ORAL | Status: DC
  Administered 2015-07-26: 0.1 mg via ORAL
  Filled 2015-07-26: qty 1

## 2015-07-26 MED ORDER — SODIUM CHLORIDE 0.9 % IV SOLN
125.0000 mg | INTRAVENOUS | Status: DC
  Filled 2015-07-26: qty 10

## 2015-07-26 MED ORDER — HYDRALAZINE HCL 50 MG PO TABS
50.0000 mg | ORAL_TABLET | Freq: Two times a day (BID) | ORAL | Status: DC
  Filled 2015-07-26 (×2): qty 1

## 2015-07-26 MED ORDER — PANTOPRAZOLE SODIUM 40 MG PO TBEC: 40.0000 mg | DELAYED_RELEASE_TABLET | Freq: Every day | ORAL | Status: DC

## 2015-07-26 MED ORDER — OXYCODONE HCL 5 MG PO TABS: 5.0000 mg | ORAL_TABLET | Freq: Four times a day (QID) | ORAL | Status: DC | PRN

## 2015-07-26 MED ORDER — MECLIZINE HCL 25 MG PO TABS: 12.5000 mg | ORAL_TABLET | Freq: Two times a day (BID) | ORAL | Status: DC | PRN

## 2015-07-26 MED ORDER — RENA-VITE PO TABS
1.0000 | ORAL_TABLET | Freq: Every day | ORAL | Status: DC
  Filled 2015-07-26: qty 1

## 2015-07-26 NOTE — Discharge Summary (Signed)
Physician Discharge Summary  Laurelyn Terrero MRN: [id number] DOB/AGE: 07/16/1955 60 y.o.  PCP: Aretta Nip, MD   Admit date: 07/25/2015 Discharge date: 07/26/2015  Discharge Diagnoses:     Principal Problem:   Symptomatic anemia Active Problems:   Rheumatoid arthritis ([place])   ESRD on dialysis 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 Qty: 30 tablet, Refills: 1      CONTINUE these medications which have NOT CHANGED   Details  acetaminophen (TYLENOL) 500 MG tablet Take 1,000 mg by mouth every 8 (eight) hours as needed for headache.    cloNIDine (CATAPRES) 0.1 MG tablet Take 1 tablet (0.1 mg total) by mouth 3 (three) times daily. Qty: 90 tablet, Refills: 0    hydrALAZINE (APRESOLINE) 50 MG tablet Take 1 tablet (50 mg total) by mouth every 8 (eight) hours. Qty: 90 tablet, Refills: 0    hydroxychloroquine (PLAQUENIL) 200 MG tablet Take 400 mg by mouth daily.     labetalol (NORMODYNE) 200 MG tablet Take 100 mg by mouth 2 (two) times daily.     meclizine (ANTIVERT) 12.5 MG tablet Take 12.5 mg by mouth 2 (two) times daily as needed for dizziness.     multivitamin (RENA-VIT) TABS tablet Take 1 tablet by mouth at bedtime. Qty: 30 each, Refills: 0    ondansetron (ZOFRAN) 8 MG tablet Take 8 mg by mouth every 8 (eight) hours as needed for nausea or vomiting.     oxyCODONE (ROXICODONE) 5 MG immediate release tablet Take 1 tablet (5 mg  total) by mouth every 6 (six) hours as needed. Qty: 20 tablet, Refills: 0    sevelamer carbonate (RENVELA) 800 MG tablet Take 1,600 mg by mouth 3 (three) times daily with meals. Take med when eating protein - up to 4 times daily      STOP taking these medications     omeprazole (PRILOSEC OTC) 20 MG tablet          Discharge Condition: Stable    Discharge Instructions Get Medicines reviewed and adjusted: Please take all your medications with you for your next visit with your Primary MD  Please request your Primary MD to go over all hospital tests and procedure/radiological results at the follow up, please ask your Primary MD to get all Hospital records sent to his/her office.  If you experience worsening of your admission symptoms, develop shortness of breath, life threatening emergency, suicidal or homicidal thoughts you must seek medical attention immediately by calling 911 or calling your MD immediately if symptoms less severe.  You must read complete instructions/literature along with all the possible adverse reactions/side effects for all the Medicines you take and that have been prescribed to you. Take any new Medicines after you have completely understood and accpet all the possible adverse reactions/side effects.   Do not drive when taking Pain medications.   Do not take more than prescribed Pain, Sleep and Anxiety Medications  Special Instructions: If you have smoked or chewed  Tobacco in the last 2 yrs please stop smoking, stop any regular Alcohol and or any Recreational drug use.  Wear Seat belts while driving.  Please note  You were cared for by a hospitalist during your hospital stay. Once you are discharged, your primary care physician will handle any further medical issues. Please note that NO REFILLS for any discharge medications will be authorized once you are discharged, as it is imperative that you return to your primary care physician (or establish a  relationship with a primary care physician if you do not have one) for your aftercare needs so that they can reassess your need for medications and monitor your lab values.  Discharge Instructions    Diet - low sodium heart healthy    Complete by:  As directed      Increase activity slowly    Complete by:  As directed             No Known Allergies    Disposition: 01-Home or Self Care   Consults:  Nephrology     Significant Diagnostic Studies:      Filed Weights   07/26/15 0127  Weight: 55.2 kg (121 lb 11.1 oz)     Microbiology: No results found for this or any previous visit (from the past 240 hour(s)).     Blood Culture    Component Value Date/Time   SDES URINE, CLEAN CATCH 07/17/2014 2115   SPECREQUEST NONE 07/17/2014 2115   CULT  07/17/2014 2115    MULTIPLE SPECIES PRESENT, SUGGEST RECOLLECTION IF CLINICALLY INDICATED   REPTSTATUS 07/19/2014 FINAL 07/17/2014 2115      Labs: Results for orders placed or performed during the hospital encounter of 07/25/15 (from the past 48 hour(s))  Type and screen [place]     Status: None (Preliminary result)   Collection Time: 07/25/15  6:54 PM  Result Value Ref Range   ABO/RH(D) O POS    Antibody Screen NEG    Sample Expiration 07/28/2015    Unit Number [id number]    Blood Component Type RED CELLS,LR    Unit division 00    Status of Unit ISSUED    Transfusion Status OK TO TRANSFUSE    Crossmatch Result Compatible   CBC     Status: Abnormal   Collection Time: 07/25/15 10:42 PM  Result Value Ref Range   WBC 7.4 4.0 - 10.5 K/uL   RBC 2.47 (L) 3.87 - 5.11 MIL/uL   Hemoglobin 7.7 (L) 12.0 - 15.0 g/dL   HCT 24.9 (L) 36.0 - 46.0 %   MCV 100.8 (H) 78.0 - 100.0 fL   MCH 31.2 26.0 - 34.0 pg   MCHC 30.9 30.0 - 36.0 g/dL   RDW 18.5 (H) 11.5 - 15.5 %   Platelets 261 150 - 400 K/uL  Comprehensive metabolic panel     Status: Abnormal   Collection Time: 07/25/15 10:42 PM  Result Value Ref  Range   Sodium 135 135 - 145 mmol/L   Potassium 4.1 3.5 - 5.1 mmol/L   Chloride 93 (L) 101 - 111 mmol/L   CO2 30 22 - 32 mmol/L   Glucose, Bld 99 65 - 99 mg/dL   BUN 36 (H) 6 - 20 mg/dL   Creatinine, Ser 5.93 (H) 0.44 - 1.00 mg/dL   Calcium 9.2 8.9 - 10.3 mg/dL   Total Protein 6.4 (L) 6.5 - 8.1 g/dL   Albumin 3.8 3.5 - 5.0 g/dL   AST 28 15 - 41  U/L   ALT 19 14 - 54 U/L   Alkaline Phosphatase 84 38 - 126 U/L   Total Bilirubin 0.5 0.3 - 1.2 mg/dL   GFR calc non Af Amer 7 (L) >60 mL/min   GFR calc Af Amer 8 (L) >60 mL/min    Comment: (NOTE) The eGFR has been calculated using the CKD EPI equation. This calculation has not been validated in all clinical situations. eGFR's persistently <60 mL/min signify possible Chronic Kidney Disease.    Anion gap 12 5 - 15  Prepare RBC     Status: None   Collection Time: 07/25/15 11:01 PM  Result Value Ref Range   Order Confirmation ORDER PROCESSED BY BLOOD BANK   Basic metabolic panel     Status: Abnormal   Collection Time: 07/26/15  5:44 AM  Result Value Ref Range   Sodium 137 135 - 145 mmol/L   Potassium 4.1 3.5 - 5.1 mmol/L   Chloride 94 (L) 101 - 111 mmol/L   CO2 30 22 - 32 mmol/L   Glucose, Bld 88 65 - 99 mg/dL   BUN 40 (H) 6 - 20 mg/dL   Creatinine, Ser 6.79 (H) 0.44 - 1.00 mg/dL   Calcium 8.9 8.9 - 10.3 mg/dL   GFR calc non Af Amer 6 (L) >60 mL/min   GFR calc Af Amer 7 (L) >60 mL/min    Comment: (NOTE) The eGFR has been calculated using the CKD EPI equation. This calculation has not been validated in all clinical situations. eGFR's persistently <60 mL/min signify possible Chronic Kidney Disease.    Anion gap 13 5 - 15  Hemoglobin and hematocrit, blood     Status: Abnormal   Collection Time: 07/26/15  5:44 AM  Result Value Ref Range   Hemoglobin 8.5 (L) 12.0 - 15.0 g/dL   HCT 26.7 (L) 36.0 - 46.0 %     Lipid Panel  No results found for: CHOL, TRIG, HDL, CHOLHDL, VLDL, LDLCALC, LDLDIRECT   No results found for:  HGBA1C   Lab Results  Component Value Date   CREATININE 6.79* 07/26/2015     HPI :   Erin Morgan is a 60 y.o. female ESRD TTS (nw uit sec shift ) admitted to [facility]ERVATION For symptomatic low HGB , was 7.0 op kid center On 07/24/15 Stool card neg As op / At [facility] last pm hgb 8.2 and 7.7 Given 1 unit Prbcs now up to 8.5 . Feels better. Nephrology consulted fo HD =orders written But she feels well enough for op center HD  HOSPITAL COURSE:    1. Symptomatic anemia  - Pt reports ~1 mo of lethargy and weakness - Hgb has been in 7's at dialysis last couple weeks, down from her prior values in 9-10 range  - There has been no melena or hematochezia; she does report GERD managed with Prilosec BID, will continue  - Recent labs include low haptoglobin and mildly depressed activity of ADAMST-13 - FOBT negative on 7/16 in ED, and negative x3 with home test;  - She has been managed with Epogen and Solaris  - Hgb was 7.7 on admission and status post 1 unit of packed red blood cells, hemoglobin now 8.5    2. ESRD on HD  - Pt reports her renal failure has been attributed to aHUS; unfortunately, she continues to require HD despite tx with Solaris infusions  - She typically dialyzes TTS and completed her 07/24/15 session without incident  - There is no indication for emergent  HD  - Nephrology consulted , Dr. Clover Mealy saw the patient and approved her to be discharged to outpatient dialysis today  3. Hypertension  - Mildly elevated on admission  - Continue current management with labetalol, clonidine, and hydralazine   4. Rheumatoid arthritis  - Stable  - Continue current management with Plaquenil   5. GERD - Stable  - Managed at home with BID Prilosec, switch to Protonix   Discharge Exam:   Blood pressure 161/67, pulse 71, temperature 98.6 F (37 C), temperature source Oral, resp. rate 18, height '5\' 4"'  (1.626 m), weight 55.2 kg (121 lb 11.1 oz), SpO2 99  %.  Respiratory: clear to auscultation bilaterally, no wheezing, no crackles. Normal respiratory effort. No accessory muscle use.  Cardiovascular: S1 & S2 heard, regular rate and rhythm, grade 3 systolic murmur throughout precordium. No extremity edema.  Abdomen: No distension, no tenderness, no masses palpated. Bowel sounds normal.  Musculoskeletal: no clubbing / cyanosis. No joint deformity upper and lower extremities. Normal muscle tone.  Skin: no significant rashes, lesions, ulcers. Warm, dry, well-perfused. Neurologic: CN 2-12 grossly intact. Sensation intact, DTR normal. Strength 5/5 in all 4 limbs.  Psychiatric: Normal judgment and insight. Alert and oriented x 3. Agitated, still angry about wait time in ED.      Follow-up Information    Follow up with Aretta Nip, MD. Schedule an appointment as soon as possible for a visit in 3 days.   Specialty:  Family Medicine   Why:  Hospital follow-up   Contact information:   [place] [address] [phone]       Signed: Reyne Dumas 07/26/2015, 11:39 AM        Time spent >45 mins

## 2015-07-26 NOTE — Progress Notes (Signed)
Patient seen and examined  60 y.o. female with medical history significant for suspected atypical HUS with end-stage renal disease on HD, rheumatoid arthritis, hypertension, and GERD who presents the emergency department at the direction of her nephrologist for evaluation and management of symptomatic anemia.Upon her arrival, hemoglobin was 8.2 and FOBT was negative.  Assessment and plan  1. Symptomatic anemia  - Pt reports ~1 mo of lethargy and weakness - Hgb has been in 7's at dialysis last couple weeks, down from her prior values in 9-10 range  - There has been no melena or hematochezia; she does report GERD managed with Prilosec BID, will continue  - Recent labs include low haptoglobin and mildly depressed activity of ADAMST-13 - FOBT negative on 7/16 in ED, and negative x3 with home test;    - She has been managed with Epogen and Solaris  - Hgb was 7.7 on admission and status post 1 unit of packed red blood cells, hemoglobin now 8.5    2. ESRD on HD  - Pt reports her renal failure has been attributed to aHUS; unfortunately, she continues to require HD despite tx with Solaris infusions  - She typically dialyzes TTS and completed her 07/24/15 session without incident  - There is no indication for emergent HD  - Nephrology consulted , Dr. Clover Mealy  to see  3. Hypertension  - Mildly elevated on admission  - Continue current management with labetalol, clonidine, and hydralazine   4. Rheumatoid arthritis  - Stable  - Continue current management with Plaquenil   5. GERD - Stable  - Managed at home with BID Prilosec, switch to Protonix

## 2015-07-26 NOTE — H&P (Signed)
History and Physical    Erin Morgan [id number] DOB: 1955/09/25 DOA: 07/25/2015  PCP: Aretta Nip, MD   Patient coming from: Home   Chief Complaint: Generalized weakness, lethargy, "Hgb 7"   HPI: Erin Morgan is a 60 y.o. female with medical history significant for suspected atypical HUS with end-stage renal disease on HD, rheumatoid arthritis, hypertension, and GERD who presents the emergency department at the direction of her nephrologist for evaluation and management of symptomatic anemia. Patient developed acute renal failure approximately one year ago which was attributed to aHUS. She was started on Solaris infusions at that time, but unfortunately was never able to come off dialysis and eventually had AVF placed in the left upper extremity. Hemoglobin and previously been in the 9-10 range and she has been managed with Epogen. She was sent to the emergency department on 07/20/2015 for evaluation of hemoglobin of 7.6 at the hemodialysis center. Upon her arrival, hemoglobin was 8.2 and FOBT was negative. She was not transfused at that time and has never received a transfusion of blood products. Today, she reports being advised by her nephrologist to come in for evaluation of hemoglobin 7.0 at dialysis on 07/24/15. She describes approximately one month of generalized weakness and fatigue. Patient reports having upper and lower endoscopy which were negative for any apparent source of bleeding. She has been taking Prilosec twice daily. She denies any melena or hematochezia and has in the past week, used 3 occult blood cards at home which a been negative. She denies chest pain, palpitations, dyspnea, or cough. She denies any abdominal pain, nausea, vomiting, or diarrhea. She denies any rash or easy bruising or bleeding. She continues regular Solaris infusions.   ED Course: Upon arrival to the ED, patient is found to be afebrile, saturating well on room air, and with vitals otherwise stable.  CMP is notable for BUN of 36 and creatinine of 5.93. CBC features a macrocytic anemia with hemoglobin of 7.7 and MCV of 100.8. INR was 1.013 days ago and recent blood work also revealed low haptoglobin and mildly low ADAMST 13 activity. nephrology was consulted by the ED provider, type and screen was performed, and 1 unit of packed red blood cells was ordered for immediate transfusion. Patient has remained hemodynamically stable in the ED with no signs or symptoms of active bleeding. She'll be observed on the telemetry unit for ongoing evaluation and management of acute on chronic anemia that has become symptomatic.   Review of Systems:  All other systems reviewed and apart from HPI, are negative.  Past Medical History  Diagnosis Date  . Wears glasses   . Osteopenia   . Seasonal allergies   . PONV (postoperative nausea and vomiting)   . Anemia   . Hypertension   . Chronic kidney disease     dialysis - t/th/sa  . GERD (gastroesophageal reflux disease)   . Rheumatoid arthritis ([place])   . Constipation   . Family history of adverse reaction to anesthesia     mom and sister has n/v    Past Surgical History  Procedure Laterality Date  . Tonsillectomy    . Colonoscopy    . Tumor excision  2000    right foot  . Carpal tunnel release Right 04/05/2014    Procedure: RIGHT CARPAL TUNNEL RELEASE;  Surgeon: Daryll Brod, MD;  Location: [facility];  Service: Orthopedics;  Laterality: Right;  ANESTHESIA:  IV REGIONAL FAB  . Av fistula placement Left 11/18/2014  Procedure: BRACHIOCEPHALIC ARTERIOVENOUS (AV) FISTULA CREATION;  Surgeon: Conrad Northwood, MD;  Location: [facility];  Service: Vascular;  Laterality: Left;     reports that she has never smoked. She has never used smokeless tobacco. She reports that she does not drink alcohol or use illicit drugs.  No Known Allergies  Family History  Problem Relation Age of Onset  . Hypertension Mother   . Cervical cancer Mother   . Cancer  Mother   . Hyperlipidemia Mother   . Hypertension Father   . Stroke Father   . Hyperlipidemia Father   . Rheum arthritis Other      Prior to Admission medications   Medication Sig Start Date End Date Taking? Authorizing Provider  acetaminophen (TYLENOL) 500 MG tablet Take 1,000 mg by mouth every 8 (eight) hours as needed for headache.   Yes Historical Provider, MD  cloNIDine (CATAPRES) 0.1 MG tablet Take 1 tablet (0.1 mg total) by mouth 3 (three) times daily. 08/09/14  Yes Velvet Bathe, MD  hydrALAZINE (APRESOLINE) 50 MG tablet Take 1 tablet (50 mg total) by mouth every 8 (eight) hours. Patient taking differently: Take 50 mg by mouth 2 (two) times daily.  08/09/14  Yes Velvet Bathe, MD  hydroxychloroquine (PLAQUENIL) 200 MG tablet Take 400 mg by mouth daily.    Yes Historical Provider, MD  labetalol (NORMODYNE) 200 MG tablet Take 100 mg by mouth 2 (two) times daily.  10/23/14  Yes Historical Provider, MD  meclizine (ANTIVERT) 12.5 MG tablet Take 12.5 mg by mouth 2 (two) times daily as needed for dizziness.  09/02/14  Yes Historical Provider, MD  multivitamin (RENA-VIT) TABS tablet Take 1 tablet by mouth at bedtime. 08/09/14  Yes Velvet Bathe, MD  omeprazole (PRILOSEC OTC) 20 MG tablet Take 20 mg by mouth 2 (two) times daily.    Yes Historical Provider, MD  ondansetron (ZOFRAN) 8 MG tablet Take 8 mg by mouth every 8 (eight) hours as needed for nausea or vomiting.  09/03/14  Yes Historical Provider, MD  oxyCODONE (ROXICODONE) 5 MG immediate release tablet Take 1 tablet (5 mg total) by mouth every 6 (six) hours as needed. Patient taking differently: Take 5 mg by mouth every 6 (six) hours as needed for moderate pain.  11/18/14  Yes Samantha J Rhyne, PA-C  sevelamer carbonate (RENVELA) 800 MG tablet Take 1,600 mg by mouth 3 (three) times daily with meals. Take med when eating protein - up to 4 times daily   Yes Historical Provider, MD    Physical Exam: Filed Vitals:   07/26/15 0045 07/26/15 0100  07/26/15 0103 07/26/15 0127  BP: 153/72 150/69 150/69 179/73  Pulse:  72 73 73  Temp:   98.6 F (37 C) 98.5 F (36.9 C)  TempSrc:   Oral Oral  Resp: 13 18 15 18   Height:    5\' 4"  (1.626 m)  Weight:    55.2 kg (121 lb 11.1 oz)  SpO2:  99% 99% 98%      Constitutional: NAD, calm, comfortable Eyes: PERTLA, lids and conjunctivae normal ENMT: Mucous membranes are moist. Posterior pharynx clear of any exudate or lesions.   Neck: normal, supple, no masses, no thyromegaly Respiratory: clear to auscultation bilaterally, no wheezing, no crackles. Normal respiratory effort. No accessory muscle use.  Cardiovascular: S1 & S2 heard, regular rate and rhythm, grade 3 systolic murmur throughout precordium. No extremity edema.   Abdomen: No distension, no tenderness, no masses palpated. Bowel sounds normal.  Musculoskeletal: no clubbing / cyanosis.  No joint deformity upper and lower extremities. Normal muscle tone.  Skin: no significant rashes, lesions, ulcers. Warm, dry, well-perfused. Neurologic: CN 2-12 grossly intact. Sensation intact, DTR normal. Strength 5/5 in all 4 limbs.  Psychiatric: Normal judgment and insight. Alert and oriented x 3. Agitated, still angry about wait time in ED.     Labs on Admission: I have personally reviewed following labs and imaging studies  CBC:  Recent Labs Lab 07/20/15 1020 07/25/15 2242  WBC 6.8 7.4  NEUTROABS 4.6  --   HGB 8.2* 7.7*  HCT 25.2* 24.9*  MCV 95.1 100.8*  PLT 247 0000000   Basic Metabolic Panel:  Recent Labs Lab 07/20/15 1020 07/25/15 2242  NA 132* 135  K 3.6 4.1  CL 89* 93*  CO2 29 30  GLUCOSE 93 99  BUN 25* 36*  CREATININE 4.88* 5.93*  CALCIUM 9.4 9.2   GFR: Estimated Creatinine Clearance: 8.7 mL/min (by C-G formula based on Cr of 5.93). Liver Function Tests:  Recent Labs Lab 07/20/15 1020 07/25/15 2242  AST 28 28  ALT 19 19  ALKPHOS 90 84  BILITOT 0.6 0.5  PROT 6.4* 6.4*  ALBUMIN 3.6 3.8   No results for input(s):  LIPASE, AMYLASE in the last 168 hours. No results for input(s): AMMONIA in the last 168 hours. Coagulation Profile: No results for input(s): INR, PROTIME in the last 168 hours. Cardiac Enzymes: No results for input(s): CKTOTAL, CKMB, CKMBINDEX, TROPONINI in the last 168 hours. BNP (last 3 results) No results for input(s): PROBNP in the last 8760 hours. HbA1C: No results for input(s): HGBA1C in the last 72 hours. CBG: No results for input(s): GLUCAP in the last 168 hours. Lipid Profile: No results for input(s): CHOL, HDL, LDLCALC, TRIG, CHOLHDL, LDLDIRECT in the last 72 hours. Thyroid Function Tests: No results for input(s): TSH, T4TOTAL, FREET4, T3FREE, THYROIDAB in the last 72 hours. Anemia Panel: No results for input(s): VITAMINB12, FOLATE, FERRITIN, TIBC, IRON, RETICCTPCT in the last 72 hours. Urine analysis:    Component Value Date/Time   COLORURINE YELLOW 07/19/2014 [place] 07/19/2014 1607   LABSPEC 1.011 07/19/2014 1607   PHURINE 7.5 07/19/2014 1607   GLUCOSEU NEGATIVE 07/19/2014 1607   HGBUR NEGATIVE 07/19/2014 1607   BILIRUBINUR NEGATIVE 07/19/2014 1607   KETONESUR NEGATIVE 07/19/2014 1607   PROTEINUR 100* 07/19/2014 1607   UROBILINOGEN 0.2 07/19/2014 1607   NITRITE NEGATIVE 07/19/2014 1607   LEUKOCYTESUR NEGATIVE 07/19/2014 1607   Sepsis Labs: @LABRCNTIP (procalcitonin:4,lacticidven:4) )No results found for this or any previous visit (from the past 240 hour(s)).   Radiological Exams on Admission: No results found.  EKG: Not performed, will obtain as appropriate.   Assessment/Plan  1. Symptomatic anemia  - Pt reports ~1 mo of lethargy and weakness - Hgb has been in 7's at dialysis last couple weeks, down from her prior values in 9-10 range  - There has been no melena or hematochezia; she does report GERD managed with Prilosec BID, will continue   - Recent labs include low haptoglobin and mildly depressed activity of ADAMST-13 - FOBT negative  on 7/16 in ED, and negative x3 with home test; pt refuses repeat on admission  - She has been managed with Epogen and Solaris  - Hgb was 7.7 on admission and 1 unit of pRBCs were ordered for immediate transfusion - RN asked to place order for post-transfusion H&H   2. ESRD on HD  - Pt reports her renal failure has been attributed to aHUS; unfortunately,  she continues to require HD despite tx with Solaris infusions  - She typically dialyzes TTS and completed her 07/24/15 session without incident  - There is no indication for emergent HD  - Nephrology consulted by EDP and much appreciated    3. Hypertension  - Mildly elevated on admission  - Continue current management with labetalol, clonidine, and hydralazine    4. Rheumatoid arthritis  - Stable  - Continue current management with Plaquenil   5. GERD - Stable  - Managed at home with BID Prilosec, will continue     DVT prophylaxis: SCD's  Code Status: Full  Family Communication: Mother updated at bedside  Disposition Plan: Observe on telemetry  Consults called: Nephrology  Admission status: Observation     Vianne Bulls, MD [facility] Pager [phone]  If 7PM-7AM, please contact night-coverage [email or web address] Password [email or web address]  07/26/2015, 2:08 AM

## 2015-07-26 NOTE — Consult Note (Signed)
    Erin Morgan is a 60 y.o. female ESRD TTS (nw uit sec shift ) admitted to OBSERVATION  For symptomatic low HGB , was 7.0 op kid center  On 07/24/15   Stool card neg  As op / At [facility] last pm  hgb 8.2 and  7.7   Given 1 unit Prbcs now up to 8.5 . Feels better. We are consulted fo HD =orders written  But she feels well enough for op center HD.  Noted at OP unit Low Transferrin Sat. And receiving IV Iron load at kidney center and on Mircera 100 mcg q 2 weeks  Last  Given on 07/17/15 will increase to max 200.with her history of low hgbs .   Full consult note to be done if stasis changes from Obser. To Admit.     Dialysis Orders: Center: nw  on tts . EDW 51.5 HD Bath 2.0 k, 2.0ca  Time 4hr Heparin 5000. Access LUA AVF     Hectorol 2 mcg IV/HD / Mirecera 100mg  q 2weeks hd last on 07/17/15   Venofer  100mg  q hd last on 07/31/15   Ernest Haber, PA-C [place] [phone] 07/26/2015, 10:18 AM

## 2015-07-27 LAB — TYPE AND SCREEN
ABO/RH(D): O POS
ANTIBODY SCREEN: NEGATIVE
Unit division: 0

## 2015-08-07 ENCOUNTER — Other Ambulatory Visit (HOSPITAL_COMMUNITY): Payer: Self-pay | Admitting: *Deleted

## 2015-08-08 ENCOUNTER — Ambulatory Visit (HOSPITAL_COMMUNITY)
Admission: RE | Admit: 2015-08-08 | Discharge: 2015-08-08 | Disposition: A | Discharge status: Home / Self Care | Payer: BC Managed Care – PPO | Source: Ambulatory Visit | Attending: Nephrology | Admitting: Nephrology

## 2015-08-08 DIAGNOSIS — D593 Hemolytic-uremic syndrome: Secondary | ICD-10-CM | POA: Diagnosis not present

## 2015-08-08 MED ORDER — SODIUM CHLORIDE 0.9 % IV SOLN
INTRAVENOUS | Status: DC
  Administered 2015-08-08: 09:00:00 via INTRAVENOUS

## 2015-08-08 MED ORDER — SODIUM CHLORIDE 0.9 % IV SOLN
1200.0000 mg | Freq: Once | INTRAVENOUS | Status: DC
  Administered 2015-08-08: 1200 mg via INTRAVENOUS
  Filled 2015-08-08: qty 120

## 2015-08-22 ENCOUNTER — Encounter (HOSPITAL_COMMUNITY)
Admission: RE | Admit: 2015-08-22 | Discharge: 2015-08-22 | Disposition: A | Discharge status: Home / Self Care | Payer: BC Managed Care – PPO | Source: Ambulatory Visit | Attending: Nephrology | Admitting: Nephrology

## 2015-08-22 DIAGNOSIS — D593 Hemolytic-uremic syndrome: Secondary | ICD-10-CM | POA: Insufficient documentation

## 2015-08-22 MED ORDER — ECULIZUMAB 300 MG/30ML IV SOLN
1200.0000 mg | Freq: Once | INTRAVENOUS | Status: AC
  Administered 2015-08-22: 1200 mg via INTRAVENOUS
  Filled 2015-08-22: qty 120

## 2015-08-22 MED ORDER — SODIUM CHLORIDE 0.9 % IV SOLN
INTRAVENOUS | Status: DC
  Administered 2015-08-22: 09:00:00 via INTRAVENOUS

## 2015-09-04 ENCOUNTER — Other Ambulatory Visit (HOSPITAL_COMMUNITY): Payer: Self-pay | Admitting: *Deleted

## 2015-09-05 ENCOUNTER — Encounter (HOSPITAL_COMMUNITY)
Admission: RE | Admit: 2015-09-05 | Discharge: 2015-09-05 | Disposition: A | Discharge status: Home / Self Care | Payer: BC Managed Care – PPO | Source: Ambulatory Visit | Attending: Nephrology | Admitting: Nephrology

## 2015-09-05 DIAGNOSIS — D593 Hemolytic-uremic syndrome: Secondary | ICD-10-CM | POA: Diagnosis present

## 2015-09-05 MED ORDER — SODIUM CHLORIDE 0.9 % IV SOLN
Freq: Once | INTRAVENOUS | Status: AC
  Administered 2015-09-05: 09:00:00 via INTRAVENOUS

## 2015-09-05 MED ORDER — SODIUM CHLORIDE 0.9 % IV SOLN
1200.0000 mg | Freq: Once | INTRAVENOUS | Status: AC
  Administered 2015-09-05: 1200 mg via INTRAVENOUS
  Filled 2015-09-05: qty 120

## 2015-09-18 ENCOUNTER — Other Ambulatory Visit (HOSPITAL_COMMUNITY): Payer: Self-pay | Admitting: *Deleted

## 2015-09-19 ENCOUNTER — Encounter (HOSPITAL_COMMUNITY)
Admission: RE | Admit: 2015-09-19 | Discharge: 2015-09-19 | Disposition: A | Discharge status: Home / Self Care | Payer: BC Managed Care – PPO | Source: Ambulatory Visit | Attending: Nephrology | Admitting: Nephrology

## 2015-09-19 DIAGNOSIS — D593 Hemolytic-uremic syndrome: Secondary | ICD-10-CM | POA: Diagnosis not present

## 2015-09-19 MED ORDER — SODIUM CHLORIDE 0.9 % IV SOLN
Freq: Once | INTRAVENOUS | Status: AC
  Administered 2015-09-19: 12:00:00 via INTRAVENOUS

## 2015-09-19 MED ORDER — SODIUM CHLORIDE 0.9 % IV SOLN
1200.0000 mg | Freq: Once | INTRAVENOUS | Status: AC
  Administered 2015-09-19: 1200 mg via INTRAVENOUS
  Filled 2015-09-19: qty 120

## 2015-10-03 ENCOUNTER — Encounter (HOSPITAL_COMMUNITY): Payer: BC Managed Care – PPO

## 2015-10-06 ENCOUNTER — Encounter (HOSPITAL_COMMUNITY)
Admission: RE | Admit: 2015-10-06 | Discharge: 2015-10-06 | Disposition: A | Discharge status: Home / Self Care | Payer: BC Managed Care – PPO | Source: Ambulatory Visit | Attending: Nephrology | Admitting: Nephrology

## 2015-10-06 DIAGNOSIS — D593 Hemolytic-uremic syndrome: Secondary | ICD-10-CM | POA: Diagnosis not present

## 2015-10-06 MED ORDER — SODIUM CHLORIDE 0.9 % IV SOLN: Freq: Once | INTRAVENOUS | Status: DC

## 2015-10-06 MED ORDER — SODIUM CHLORIDE 0.9 % IV SOLN
1200.0000 mg | Freq: Once | INTRAVENOUS | Status: AC
  Administered 2015-10-06: 1200 mg via INTRAVENOUS
  Filled 2015-10-06: qty 120

## 2015-10-23 ENCOUNTER — Other Ambulatory Visit (HOSPITAL_COMMUNITY): Payer: Self-pay | Admitting: *Deleted

## 2015-10-24 ENCOUNTER — Encounter (HOSPITAL_COMMUNITY)
Admission: RE | Admit: 2015-10-24 | Discharge: 2015-10-24 | Disposition: A | Discharge status: Home / Self Care | Payer: BC Managed Care – PPO | Source: Ambulatory Visit | Attending: Nephrology | Admitting: Nephrology

## 2015-10-24 DIAGNOSIS — D593 Hemolytic-uremic syndrome: Secondary | ICD-10-CM | POA: Diagnosis not present

## 2015-10-24 MED ORDER — SODIUM CHLORIDE 0.9 % IV SOLN
1200.0000 mg | Freq: Once | INTRAVENOUS | Status: AC
  Administered 2015-10-24: 1200 mg via INTRAVENOUS
  Filled 2015-10-24: qty 120

## 2015-11-07 ENCOUNTER — Encounter (HOSPITAL_COMMUNITY)
Admission: RE | Admit: 2015-11-07 | Discharge: 2015-11-07 | Disposition: A | Discharge status: Home / Self Care | Payer: BC Managed Care – PPO | Source: Ambulatory Visit | Attending: Nephrology | Admitting: Nephrology

## 2015-11-07 DIAGNOSIS — D593 Hemolytic-uremic syndrome: Secondary | ICD-10-CM | POA: Insufficient documentation

## 2015-11-07 MED ORDER — SODIUM CHLORIDE 0.9 % IV SOLN
1200.0000 mg | Freq: Once | INTRAVENOUS | Status: DC
  Administered 2015-11-07: 1200 mg via INTRAVENOUS
  Filled 2015-11-07: qty 120

## 2015-11-21 ENCOUNTER — Encounter (HOSPITAL_COMMUNITY)
Admission: RE | Admit: 2015-11-21 | Discharge: 2015-11-21 | Disposition: A | Discharge status: Home / Self Care | Payer: BC Managed Care – PPO | Source: Ambulatory Visit | Attending: Nephrology | Admitting: Nephrology

## 2015-11-21 DIAGNOSIS — D593 Hemolytic-uremic syndrome: Secondary | ICD-10-CM | POA: Diagnosis not present

## 2015-11-21 MED ORDER — SODIUM CHLORIDE 0.9 % IV SOLN
1200.0000 mg | Freq: Once | INTRAVENOUS | Status: AC
  Administered 2015-11-21: 1200 mg via INTRAVENOUS
  Filled 2015-11-21: qty 120

## 2015-12-05 ENCOUNTER — Encounter (HOSPITAL_COMMUNITY)
Admission: RE | Admit: 2015-12-05 | Discharge: 2015-12-05 | Disposition: A | Discharge status: Home / Self Care | Payer: BC Managed Care – PPO | Source: Ambulatory Visit | Attending: Nephrology | Admitting: Nephrology

## 2015-12-05 DIAGNOSIS — D593 Hemolytic-uremic syndrome: Secondary | ICD-10-CM | POA: Diagnosis present

## 2015-12-05 MED ORDER — SODIUM CHLORIDE 0.9 % IV SOLN
1200.0000 mg | Freq: Once | INTRAVENOUS | Status: AC
  Administered 2015-12-05: 1200 mg via INTRAVENOUS
  Filled 2015-12-05: qty 120

## 2015-12-19 ENCOUNTER — Ambulatory Visit (HOSPITAL_COMMUNITY)
Admission: RE | Admit: 2015-12-19 | Discharge: 2015-12-19 | Disposition: A | Discharge status: Home / Self Care | Payer: BC Managed Care – PPO | Source: Ambulatory Visit | Attending: Nephrology | Admitting: Nephrology

## 2015-12-19 DIAGNOSIS — D593 Hemolytic-uremic syndrome: Secondary | ICD-10-CM | POA: Insufficient documentation

## 2015-12-19 MED ORDER — SODIUM CHLORIDE 0.9 % IV SOLN
1200.0000 mg | INTRAVENOUS | Status: DC
  Administered 2015-12-19: 1200 mg via INTRAVENOUS
  Filled 2015-12-19: qty 120

## 2016-01-01 ENCOUNTER — Other Ambulatory Visit (HOSPITAL_COMMUNITY): Payer: Self-pay | Admitting: *Deleted

## 2016-01-02 ENCOUNTER — Encounter (HOSPITAL_COMMUNITY)
Admission: RE | Admit: 2016-01-02 | Discharge: 2016-01-02 | Disposition: A | Discharge status: Home / Self Care | Payer: BC Managed Care – PPO | Source: Ambulatory Visit | Attending: Nephrology | Admitting: Nephrology

## 2016-01-02 DIAGNOSIS — D593 Hemolytic-uremic syndrome: Secondary | ICD-10-CM | POA: Diagnosis not present

## 2016-01-02 MED ORDER — SODIUM CHLORIDE 0.9 % IV SOLN
1200.0000 mg | INTRAVENOUS | Status: DC
  Administered 2016-01-02: 1200 mg via INTRAVENOUS
  Filled 2016-01-02: qty 120

## 2016-01-15 ENCOUNTER — Other Ambulatory Visit (HOSPITAL_COMMUNITY): Payer: Self-pay | Admitting: *Deleted

## 2016-01-16 ENCOUNTER — Ambulatory Visit (HOSPITAL_COMMUNITY)
Admission: RE | Admit: 2016-01-16 | Discharge: 2016-01-16 | Disposition: A | Discharge status: Home / Self Care | Payer: BC Managed Care – PPO | Source: Ambulatory Visit | Attending: Nephrology | Admitting: Nephrology

## 2016-01-16 DIAGNOSIS — D593 Hemolytic-uremic syndrome: Secondary | ICD-10-CM | POA: Diagnosis present

## 2016-01-16 MED ORDER — SODIUM CHLORIDE 0.9 % IV SOLN
1200.0000 mg | INTRAVENOUS | Status: DC
  Administered 2016-01-16: 1200 mg via INTRAVENOUS
  Filled 2016-01-16: qty 120

## 2016-01-29 ENCOUNTER — Other Ambulatory Visit (HOSPITAL_COMMUNITY): Payer: Self-pay | Admitting: *Deleted

## 2016-01-30 ENCOUNTER — Encounter (HOSPITAL_COMMUNITY)
Admission: RE | Admit: 2016-01-30 | Discharge: 2016-01-30 | Disposition: A | Discharge status: Home / Self Care | Payer: BC Managed Care – PPO | Source: Ambulatory Visit | Attending: Nephrology | Admitting: Nephrology

## 2016-01-30 ENCOUNTER — Ambulatory Visit (HOSPITAL_COMMUNITY): Admission: RE | Admit: 2016-01-30 | Payer: BC Managed Care – PPO | Source: Ambulatory Visit

## 2016-01-30 DIAGNOSIS — D593 Hemolytic-uremic syndrome: Secondary | ICD-10-CM | POA: Diagnosis not present

## 2016-01-30 MED ORDER — SODIUM CHLORIDE 0.9 % IV SOLN
1200.0000 mg | Freq: Once | INTRAVENOUS | Status: AC
  Administered 2016-01-30: 09:00:00 1200 mg via INTRAVENOUS
  Filled 2016-01-30: qty 120

## 2016-02-04 DIAGNOSIS — Z992 Dependence on renal dialysis: Secondary | ICD-10-CM | POA: Diagnosis not present

## 2016-02-04 DIAGNOSIS — M311 Thrombotic microangiopathy: Secondary | ICD-10-CM | POA: Diagnosis not present

## 2016-02-04 DIAGNOSIS — N186 End stage renal disease: Secondary | ICD-10-CM | POA: Diagnosis not present

## 2016-02-13 ENCOUNTER — Ambulatory Visit (HOSPITAL_COMMUNITY)
Admission: RE | Admit: 2016-02-13 | Discharge: 2016-02-13 | Disposition: A | Discharge status: Home / Self Care | Payer: BC Managed Care – PPO | Source: Ambulatory Visit | Attending: Nephrology | Admitting: Nephrology

## 2016-02-13 DIAGNOSIS — D593 Hemolytic-uremic syndrome: Secondary | ICD-10-CM | POA: Insufficient documentation

## 2016-02-13 MED ORDER — SODIUM CHLORIDE 0.9 % IV SOLN
1200.0000 mg | Freq: Once | INTRAVENOUS | Status: AC
  Administered 2016-02-13: 1200 mg via INTRAVENOUS
  Filled 2016-02-13: qty 120

## 2016-02-26 ENCOUNTER — Other Ambulatory Visit (HOSPITAL_COMMUNITY): Payer: Self-pay | Admitting: *Deleted

## 2016-02-27 ENCOUNTER — Ambulatory Visit (HOSPITAL_COMMUNITY)
Admission: RE | Admit: 2016-02-27 | Discharge: 2016-02-27 | Disposition: A | Discharge status: Home / Self Care | Payer: BC Managed Care – PPO | Source: Ambulatory Visit | Attending: Nephrology | Admitting: Nephrology

## 2016-02-27 DIAGNOSIS — D593 Hemolytic-uremic syndrome: Secondary | ICD-10-CM | POA: Diagnosis present

## 2016-02-27 MED ORDER — SODIUM CHLORIDE 0.9 % IV SOLN
1200.0000 mg | Freq: Once | INTRAVENOUS | Status: AC
  Administered 2016-02-27: 08:00:00 1200 mg via INTRAVENOUS
  Filled 2016-02-27: qty 120

## 2016-02-27 MED ORDER — SODIUM CHLORIDE 0.9 % IV SOLN: 1200.0000 mg | Freq: Once | INTRAVENOUS | Status: DC

## 2016-03-11 ENCOUNTER — Other Ambulatory Visit (HOSPITAL_COMMUNITY): Payer: Self-pay | Admitting: *Deleted

## 2016-03-12 ENCOUNTER — Ambulatory Visit (HOSPITAL_COMMUNITY)
Admission: RE | Admit: 2016-03-12 | Discharge: 2016-03-12 | Disposition: A | Discharge status: Home / Self Care | Payer: BC Managed Care – PPO | Source: Ambulatory Visit | Attending: Nephrology | Admitting: Nephrology

## 2016-03-12 DIAGNOSIS — D593 Hemolytic-uremic syndrome: Secondary | ICD-10-CM | POA: Diagnosis not present

## 2016-03-12 MED ORDER — SODIUM CHLORIDE 0.9 % IV SOLN
1200.0000 mg | Freq: Once | INTRAVENOUS | Status: AC
  Administered 2016-03-12: 1200 mg via INTRAVENOUS
  Filled 2016-03-12: qty 120

## 2016-03-26 ENCOUNTER — Encounter (HOSPITAL_COMMUNITY)
Admission: RE | Admit: 2016-03-26 | Discharge: 2016-03-26 | Disposition: A | Discharge status: Home / Self Care | Payer: BC Managed Care – PPO | Source: Ambulatory Visit | Attending: Nephrology | Admitting: Nephrology

## 2016-03-26 DIAGNOSIS — D593 Hemolytic-uremic syndrome: Secondary | ICD-10-CM | POA: Diagnosis present

## 2016-03-26 MED ORDER — SODIUM CHLORIDE 0.9 % IV SOLN
1200.0000 mg | Freq: Once | INTRAVENOUS | Status: AC
  Administered 2016-03-26: 09:00:00 1200 mg via INTRAVENOUS
  Filled 2016-03-26: qty 120

## 2016-03-26 NOTE — Progress Notes (Signed)
Pt unable to stay 30 minutes post infusion today due to dialysis appointment.  Pt verbalized understanding that it is recomended  She be monitored for 30 min post infusion every visit.  VSS and pt states she feels fine so she is going to have to go to get to her appt on time.  PT DC home

## 2016-04-09 ENCOUNTER — Ambulatory Visit (HOSPITAL_COMMUNITY)
Admission: RE | Admit: 2016-04-09 | Discharge: 2016-04-09 | Disposition: A | Discharge status: Home / Self Care | Payer: BC Managed Care – PPO | Source: Ambulatory Visit | Attending: Nephrology | Admitting: Nephrology

## 2016-04-09 DIAGNOSIS — D593 Hemolytic-uremic syndrome: Secondary | ICD-10-CM | POA: Diagnosis present

## 2016-04-09 MED ORDER — SODIUM CHLORIDE 0.9 % IV SOLN
1200.0000 mg | Freq: Once | INTRAVENOUS | Status: AC
  Administered 2016-04-09: 09:00:00 1200 mg via INTRAVENOUS
  Filled 2016-04-09: qty 120

## 2016-04-22 ENCOUNTER — Ambulatory Visit (HOSPITAL_COMMUNITY)
Admission: RE | Admit: 2016-04-22 | Discharge: 2016-04-22 | Disposition: A | Discharge status: Home / Self Care | Payer: BC Managed Care – PPO | Source: Ambulatory Visit | Attending: Nephrology | Admitting: Nephrology

## 2016-04-22 DIAGNOSIS — D593 Hemolytic-uremic syndrome: Secondary | ICD-10-CM | POA: Diagnosis not present

## 2016-04-22 MED ORDER — SODIUM CHLORIDE 0.9 % IV SOLN
1200.0000 mg | Freq: Once | INTRAVENOUS | Status: DC
  Administered 2016-04-22: 09:00:00 1200 mg via INTRAVENOUS
  Filled 2016-04-22: qty 120

## 2016-04-28 ENCOUNTER — Other Ambulatory Visit: Payer: Self-pay | Admitting: Nephrology

## 2016-04-28 ENCOUNTER — Ambulatory Visit
Admission: RE | Admit: 2016-04-28 | Discharge: 2016-04-28 | Disposition: A | Discharge status: Home / Self Care | Payer: BC Managed Care – PPO | Source: Ambulatory Visit | Attending: Nephrology | Admitting: Nephrology

## 2016-04-28 DIAGNOSIS — R05 Cough: Secondary | ICD-10-CM

## 2016-04-28 DIAGNOSIS — R059 Cough, unspecified: Secondary | ICD-10-CM

## 2016-04-28 DIAGNOSIS — Z0000 Encounter for general adult medical examination without abnormal findings: Secondary | ICD-10-CM

## 2016-05-06 ENCOUNTER — Other Ambulatory Visit (HOSPITAL_COMMUNITY): Payer: Self-pay | Admitting: *Deleted

## 2016-05-07 ENCOUNTER — Ambulatory Visit (HOSPITAL_COMMUNITY)
Admission: RE | Admit: 2016-05-07 | Discharge: 2016-05-07 | Disposition: A | Discharge status: Home / Self Care | Payer: BC Managed Care – PPO | Source: Ambulatory Visit | Attending: Nephrology | Admitting: Nephrology

## 2016-05-07 DIAGNOSIS — D593 Hemolytic-uremic syndrome: Secondary | ICD-10-CM | POA: Diagnosis present

## 2016-05-07 MED ORDER — SODIUM CHLORIDE 0.9 % IV SOLN
Freq: Once | INTRAVENOUS | Status: AC
  Administered 2016-05-07: 14:00:00 via INTRAVENOUS

## 2016-05-07 MED ORDER — SODIUM CHLORIDE 0.9 % IV SOLN
1200.0000 mg | Freq: Once | INTRAVENOUS | Status: DC
  Administered 2016-05-07: 1200 mg via INTRAVENOUS
  Filled 2016-05-07: qty 120

## 2016-05-21 ENCOUNTER — Encounter (HOSPITAL_COMMUNITY)
Admission: RE | Admit: 2016-05-21 | Discharge: 2016-05-21 | Disposition: A | Discharge status: Home / Self Care | Payer: BC Managed Care – PPO | Source: Ambulatory Visit | Attending: Nephrology | Admitting: Nephrology

## 2016-05-21 DIAGNOSIS — D593 Hemolytic-uremic syndrome: Secondary | ICD-10-CM | POA: Diagnosis present

## 2016-05-21 MED ORDER — SODIUM CHLORIDE 0.9 % IV SOLN
1200.0000 mg | Freq: Once | INTRAVENOUS | Status: AC
  Administered 2016-05-21: 1200 mg via INTRAVENOUS
  Filled 2016-05-21: qty 120

## 2016-06-04 ENCOUNTER — Inpatient Hospital Stay (HOSPITAL_COMMUNITY): Admission: RE | Admit: 2016-06-04 | Payer: BC Managed Care – PPO | Source: Ambulatory Visit

## 2016-07-27 ENCOUNTER — Encounter: Payer: Self-pay | Admitting: Vascular Surgery

## 2016-08-11 ENCOUNTER — Ambulatory Visit: Payer: BC Managed Care – PPO | Admitting: Vascular Surgery

## 2016-08-20 ENCOUNTER — Encounter: Payer: Self-pay | Admitting: Vascular Surgery

## 2016-09-01 ENCOUNTER — Encounter: Payer: Self-pay | Admitting: Vascular Surgery

## 2016-09-01 ENCOUNTER — Ambulatory Visit (INDEPENDENT_AMBULATORY_CARE_PROVIDER_SITE_OTHER): Payer: BC Managed Care – PPO | Admitting: Vascular Surgery

## 2016-09-01 VITALS — BP 140/77 | HR 70 | Temp 98.0°F | Ht 64.0 in | Wt 129.1 lb

## 2016-09-01 DIAGNOSIS — T82898A Other specified complication of vascular prosthetic devices, implants and grafts, initial encounter: Secondary | ICD-10-CM | POA: Diagnosis not present

## 2016-09-01 NOTE — Progress Notes (Signed)
Vascular and Vein Specialist of [facility]  Patient name: Erin Morgan MRN: [id number] DOB: February 27, 1955 Sex: female  REASON FOR VISIT: aneurysms left upper arm fistula, requesting provider is Dr. Lorrene Reid  HPI:    Erin Morgan is a 61 y.o. female who presents for evaluation of her left upper arm fistula aneurysms. She underwent left brachial-cephalic AV fistula creation on 11/18/14 by Dr. Bridgett Larsson. She denies any issues with dialysis, specifically no bleeding issues. She reports having these aneurysms "for as long as I can remember." She denies any changes in the size of her aneurysms. She recently underwent a fistulogram by Dr. Posey Pronto at [facility] on 07/26/16 after an infiltration event. This revealed only mild cephalic arch and inflow cephalic vein stenosis that was not impairing flow.   She dialyzes on Tuesdays, Thursdays and Saturdays. She has never had any other access surgeries. Her primary nephrologist is Dr. Lorrene Reid. She is not on any blood thinners. She denies any chest pain or shortness of breath. Past medical history includes rheumatoid arthritis, hypertension and anemia.   PAST MEDICAL HISTORY:   Past Medical History:  Diagnosis Date  . Anemia   . Chronic kidney disease    dialysis - t/th/sa  . Constipation   . Family history of adverse reaction to anesthesia    mom and sister has n/v  . GERD (gastroesophageal reflux disease)   . Hypertension   . Osteopenia   . PONV (postoperative nausea and vomiting)   . Rheumatoid arthritis ([place])   . Seasonal allergies   . Wears glasses     Family History  Problem Relation Age of Onset  . Hypertension Mother   . Cervical cancer Mother   . Cancer Mother   . Hyperlipidemia Mother   . Hypertension Father   . Stroke Father   . Hyperlipidemia Father   . Rheum arthritis Other     Social History  Substance Use Topics  . Smoking status: Never Smoker  . Smokeless tobacco: Never Used  . Alcohol use No     Comment:  no    No Known  Allergies  MEDICATIONS:   Current Outpatient Prescriptions  Medication Sig Dispense Refill  . acetaminophen (TYLENOL) 500 MG tablet Take 1,000 mg by mouth every 8 (eight) hours as needed for headache.    . hydrALAZINE (APRESOLINE) 50 MG tablet Take 1 tablet (50 mg total) by mouth every 8 (eight) hours. (Patient taking differently: Take 50 mg by mouth 2 (two) times daily. ) 90 tablet 0  . hydroxychloroquine (PLAQUENIL) 200 MG tablet Take 400 mg by mouth daily.     . hydrOXYzine (ATARAX/VISTARIL) 25 MG tablet Take 25 mg by mouth 2 (two) times daily.    Marland Kitchen labetalol (NORMODYNE) 200 MG tablet Take 100 mg by mouth 2 (two) times daily.     . multivitamin (RENA-VIT) TABS tablet Take 1 tablet by mouth at bedtime. 30 each 0  . pantoprazole (PROTONIX) 40 MG tablet Take 1 tablet (40 mg total) by mouth daily. (Patient taking differently: Take 40 mg by mouth 2 (two) times daily. ) 30 tablet 1  . sevelamer carbonate (RENVELA) 800 MG tablet Take 1,600 mg by mouth 3 (three) times daily with meals. Take med when eating protein - up to 4 times daily     No current facility-administered medications for this visit.     REVIEW OF SYSTEMS:   REVIEW OF SYSTEMS (negative unless checked):   Cardiac:  []  Chest pain or chest pressure? []  Shortness of  breath upon activity? []  Shortness of breath when lying flat? []  Irregular heart rhythm?  Vascular:  []  Pain in calf, thigh, or hip brought on by walking? []  Pain in feet at night that wakes you up from your sleep? []  Blood clot in your veins? []  Leg swelling?  Pulmonary:  []  Oxygen at home? []  Productive cough? []  Wheezing?  Neurologic:  []  Sudden weakness in arms or legs? []  Sudden numbness in arms or legs? []  Sudden onset of difficult speaking or slurred speech? []  Temporary loss of vision in one eye? []  Problems with dizziness?  Gastrointestinal:  []  Blood in stool? []  Vomited blood?  Genitourinary:  []  Burning when urinating? []  Blood in  urine?  Psychiatric:  []  Major depression  Hematologic:  []  Bleeding problems? []  Problems with blood clotting?  Dermatologic:  []  Rashes or ulcers?  Constitutional:  []  Fever or chills?  Ear/Nose/Throat:  []  Change in hearing? []  Nose bleeds? []  Sore throat?  Musculoskeletal:  []  Back pain? []  Joint pain? []  Muscle pain?  PHYSICAL EXAM:   Vitals:   09/01/16 1338  BP: 140/77  Pulse: 70  Temp: 98 F (36.7 C)  Weight: 129 lb 1.6 oz (58.6 kg)  Height: 5\' 4"  (1.626 m)    GENERAL: The patient is a well-nourished female, in no acute distress. The vital signs are documented above. HEENT: normocephalic, atraumatic. No abnormalities noted.  CARDIAC: There is a regular rate and rhythm. Systolic murmur heard. No carotid bruits.  VASCULAR: 2+ radial pulses bilaterally. Palpable thrill left upper arm fistula. Distal aneurysm is larger with skin thinning. There is another aneurysm more proximal to this and it is smaller without skin thinning. No scabs or ulcerations on aneurysms. Palpable thrill throughout fistula.  PULMONARY: There is good air exchange bilaterally without wheezing or rales. MUSCULOSKELETAL: There are no major deformities or cyanosis. NEUROLOGIC: No focal weakness or paresthesias are detected. SKIN: There are no ulcers or rashes noted. PSYCHIATRIC: The patient has a normal affect.  DATA:    Fistulogram report from 07/26/16 at [facility] reviewed. This revealed mild cephalic arm and inflow cephalic vein stenosis that was not flow limiting at this time.   ASSESSMENT/PLAN:   Left upper arm fistula aneurysm x 2  The distal aneurysm is larger with some skin thinning. She denies any changes in the aneurysm size over the last several weeks. Denies any bleeding issues. The proximal aneurysm is smaller. Had fistulogram on 7/23 at [facility] without evidence of significant stenosis in fistula or outflow vein. Would recommend surgical repair of larger distal aneurysm  to reduce risk of bleeding. She will not have enough room to continue cannulating her fistula if both aneurysm are repaired. She dialyzes on Tuesdays, Thursdays and Saturdays. She is agreeable to proceed. She will be scheduled for revision of her left upper arm fistula on 10/01/16 with Dr. Scot Dock.    Would continue cannulating fistula away from both aneurysms.   Virgina Jock, PA-C [facility] Specialists of [facility] MD: Scot Dock  I have interviewed the patient and examined the patient. I agree with the findings by the PA. The patient has a fairly large aneurysm in her proximal fistula that I think is at risk for continued expansion. This could potentially compromise the overlying skin and put her at risk for bleeding. This reason I have recommended revision and plication of the aneurysm. We have discussed the other possibility that she would require patch angioplasty of this area. There should be  enough room to cannulate the fistula above and below the revision so she should not require a catheter. We have discussed the indications for the procedure and the potential complications including the risk of thrombosis of the fistula. I tended to schedule this for this Friday over she has other commitments. We were unable to schedule this until 10/01/2016. I've explained that if there is any change in the aneurysm and certainly we would want to know him we could try to schedule it sooner on a Monday Wednesday or Friday potentially with one of my partners.  Gae Gallop, MD [phone]

## 2016-09-02 DIAGNOSIS — Z1231 Encounter for screening mammogram for malignant neoplasm of breast: Secondary | ICD-10-CM | POA: Diagnosis not present

## 2016-09-07 ENCOUNTER — Encounter: Payer: Self-pay | Admitting: Nephrology

## 2016-09-17 ENCOUNTER — Other Ambulatory Visit: Payer: Self-pay | Admitting: Vascular Surgery

## 2016-09-17 ENCOUNTER — Encounter: Payer: Self-pay | Admitting: Vascular Surgery

## 2016-09-17 ENCOUNTER — Telehealth: Payer: Self-pay | Admitting: Vascular Surgery

## 2016-09-17 NOTE — Telephone Encounter (Signed)
Printed patient angiogram instructions in error.  Revised and printed instructions for Short stay.  Will mail to patient's address.  Thurston Hole., LPN

## 2016-09-30 ENCOUNTER — Encounter (HOSPITAL_COMMUNITY): Payer: Self-pay | Admitting: *Deleted

## 2016-09-30 NOTE — Anesthesia Preprocedure Evaluation (Addendum)
Anesthesia Evaluation  Patient identified by MRN, date of birth, ID band Patient awake    Reviewed: Allergy & Precautions, H&P , NPO status , Patient's Chart, lab work & pertinent test results  History of Anesthesia Complications (+) PONV  Airway Mallampati: II  TM Distance: >3 FB Neck ROM: Full    Dental no notable dental hx. (+) Teeth Intact, Dental Advisory Given   Pulmonary neg pulmonary ROS,    Pulmonary exam normal breath sounds clear to auscultation       Cardiovascular Exercise Tolerance: Good hypertension, Pt. on medications and Pt. on home beta blockers  Rhythm:Regular Rate:Normal     Neuro/Psych  Headaches, negative psych ROS   GI/Hepatic Neg liver ROS, GERD  Controlled and Medicated,  Endo/Other  negative endocrine ROS  Renal/GU ESRF and DialysisRenal disease  negative genitourinary   Musculoskeletal  (+) Arthritis , Rheumatoid disorders,    Abdominal   Peds  Hematology negative hematology ROS (+) anemia ,   Anesthesia Other Findings   Reproductive/Obstetrics negative OB ROS                            Anesthesia Physical Anesthesia Plan  ASA: III  Anesthesia Plan: MAC   Post-op Pain Management:    Induction: Intravenous  PONV Risk Score and Plan: 4 or greater and Ondansetron, Dexamethasone, Midazolam and Propofol infusion  Airway Management Planned: Simple Face Mask  Additional Equipment:   Intra-op Plan:   Post-operative Plan:   Informed Consent: I have reviewed the patients History and Physical, chart, labs and discussed the procedure including the risks, benefits and alternatives for the proposed anesthesia with the patient or authorized representative who has indicated his/her understanding and acceptance.   Dental advisory given  Plan Discussed with: CRNA  Anesthesia Plan Comments:         Anesthesia Quick Evaluation

## 2016-10-01 ENCOUNTER — Encounter (HOSPITAL_COMMUNITY)
Admission: RE | Disposition: A | Discharge status: Home / Self Care | Payer: Self-pay | Source: Ambulatory Visit | Attending: Vascular Surgery

## 2016-10-01 ENCOUNTER — Ambulatory Visit (HOSPITAL_COMMUNITY): Payer: BC Managed Care – PPO | Admitting: Anesthesiology

## 2016-10-01 ENCOUNTER — Encounter (HOSPITAL_COMMUNITY): Payer: Self-pay

## 2016-10-01 ENCOUNTER — Ambulatory Visit (HOSPITAL_COMMUNITY)
Admission: RE | Admit: 2016-10-01 | Discharge: 2016-10-01 | Disposition: A | Discharge status: Home / Self Care | Payer: BC Managed Care – PPO | Source: Ambulatory Visit | Attending: Vascular Surgery | Admitting: Vascular Surgery

## 2016-10-01 DIAGNOSIS — K219 Gastro-esophageal reflux disease without esophagitis: Secondary | ICD-10-CM | POA: Diagnosis not present

## 2016-10-01 DIAGNOSIS — M069 Rheumatoid arthritis, unspecified: Secondary | ICD-10-CM | POA: Diagnosis not present

## 2016-10-01 DIAGNOSIS — Z992 Dependence on renal dialysis: Secondary | ICD-10-CM | POA: Diagnosis not present

## 2016-10-01 DIAGNOSIS — I120 Hypertensive chronic kidney disease with stage 5 chronic kidney disease or end stage renal disease: Secondary | ICD-10-CM | POA: Insufficient documentation

## 2016-10-01 DIAGNOSIS — M8580 Other specified disorders of bone density and structure, unspecified site: Secondary | ICD-10-CM | POA: Diagnosis not present

## 2016-10-01 DIAGNOSIS — D631 Anemia in chronic kidney disease: Secondary | ICD-10-CM | POA: Diagnosis not present

## 2016-10-01 DIAGNOSIS — Y832 Surgical operation with anastomosis, bypass or graft as the cause of abnormal reaction of the patient, or of later complication, without mention of misadventure at the time of the procedure: Secondary | ICD-10-CM | POA: Insufficient documentation

## 2016-10-01 DIAGNOSIS — N186 End stage renal disease: Secondary | ICD-10-CM | POA: Insufficient documentation

## 2016-10-01 DIAGNOSIS — T82898A Other specified complication of vascular prosthetic devices, implants and grafts, initial encounter: Secondary | ICD-10-CM | POA: Diagnosis not present

## 2016-10-01 HISTORY — PX: REVISON OF ARTERIOVENOUS FISTULA: SHX6074

## 2016-10-01 HISTORY — DX: Personal history of other medical treatment: Z92.89

## 2016-10-01 LAB — POCT I-STAT 4, (NA,K, GLUC, HGB,HCT)
Glucose, Bld: 93 mg/dL (ref 65–99)
HEMATOCRIT: 35 % — AB (ref 36.0–46.0)
Hemoglobin: 11.9 g/dL — ABNORMAL LOW (ref 12.0–15.0)
Potassium: 3.7 mmol/L (ref 3.5–5.1)
Sodium: 134 mmol/L — ABNORMAL LOW (ref 135–145)

## 2016-10-01 SURGERY — REVISON OF ARTERIOVENOUS FISTULA
Anesthesia: Monitor Anesthesia Care | Site: Arm Upper | Laterality: Left

## 2016-10-01 MED ORDER — LIDOCAINE HCL (PF) 1 % IJ SOLN
INTRAMUSCULAR | Status: DC | PRN
  Administered 2016-10-01: 5 mL
  Administered 2016-10-01: 12 mL

## 2016-10-01 MED ORDER — THROMBIN 20000 UNITS EX SOLR
CUTANEOUS | Status: AC
  Filled 2016-10-01: qty 20000

## 2016-10-01 MED ORDER — SODIUM CHLORIDE 0.9 % IV SOLN
INTRAVENOUS | Status: DC | PRN
  Administered 2016-10-01: 08:00:00 500 mL

## 2016-10-01 MED ORDER — CHLORHEXIDINE GLUCONATE CLOTH 2 % EX PADS: 6.0000 | MEDICATED_PAD | Freq: Once | CUTANEOUS | Status: DC

## 2016-10-01 MED ORDER — LIDOCAINE HCL 1 % IJ SOLN
INTRAMUSCULAR | Status: AC
  Filled 2016-10-01: qty 40

## 2016-10-01 MED ORDER — SODIUM CHLORIDE 0.9 % IR SOLN
Status: DC | PRN
  Administered 2016-10-01: 1000 mL

## 2016-10-01 MED ORDER — PROPOFOL 10 MG/ML IV BOLUS
INTRAVENOUS | Status: DC | PRN
  Administered 2016-10-01: 40 mg via INTRAVENOUS

## 2016-10-01 MED ORDER — PROPOFOL 500 MG/50ML IV EMUL
INTRAVENOUS | Status: DC | PRN
  Administered 2016-10-01: 100 ug/kg/min via INTRAVENOUS

## 2016-10-01 MED ORDER — PROPOFOL 10 MG/ML IV BOLUS
INTRAVENOUS | Status: AC
  Filled 2016-10-01: qty 20

## 2016-10-01 MED ORDER — MIDAZOLAM HCL 5 MG/5ML IJ SOLN
INTRAMUSCULAR | Status: DC | PRN
  Administered 2016-10-01: 2 mg via INTRAVENOUS

## 2016-10-01 MED ORDER — DEXTROSE 5 % IV SOLN
1.5000 g | INTRAVENOUS | Status: AC
  Administered 2016-10-01: 1.5 g via INTRAVENOUS

## 2016-10-01 MED ORDER — ONDANSETRON HCL 4 MG/2ML IJ SOLN
INTRAMUSCULAR | Status: DC | PRN
  Administered 2016-10-01: 4 mg via INTRAVENOUS

## 2016-10-01 MED ORDER — HEPARIN SODIUM (PORCINE) 1000 UNIT/ML IJ SOLN
INTRAMUSCULAR | Status: DC | PRN
  Administered 2016-10-01: 5000 [IU] via INTRAVENOUS

## 2016-10-01 MED ORDER — FENTANYL CITRATE (PF) 250 MCG/5ML IJ SOLN
INTRAMUSCULAR | Status: AC
  Filled 2016-10-01: qty 5

## 2016-10-01 MED ORDER — MIDAZOLAM HCL 2 MG/2ML IJ SOLN
INTRAMUSCULAR | Status: AC
  Filled 2016-10-01: qty 2

## 2016-10-01 MED ORDER — PROTAMINE SULFATE 10 MG/ML IV SOLN
INTRAVENOUS | Status: AC
  Filled 2016-10-01: qty 25

## 2016-10-01 MED ORDER — SODIUM CHLORIDE 0.9 % IV SOLN
INTRAVENOUS | Status: DC
  Administered 2016-10-01: 07:00:00 via INTRAVENOUS

## 2016-10-01 MED ORDER — FENTANYL CITRATE (PF) 100 MCG/2ML IJ SOLN: 25.0000 ug | INTRAMUSCULAR | Status: DC | PRN

## 2016-10-01 MED ORDER — ONDANSETRON HCL 4 MG/2ML IJ SOLN
INTRAMUSCULAR | Status: AC
  Filled 2016-10-01: qty 2

## 2016-10-01 MED ORDER — OXYCODONE-ACETAMINOPHEN 5-325 MG PO TABS: 1.0000 | ORAL_TABLET | Freq: Four times a day (QID) | ORAL | 0 refills | Status: DC | PRN

## 2016-10-01 MED ORDER — DEXTROSE 5 % IV SOLN
INTRAVENOUS | Status: AC
  Filled 2016-10-01: qty 1.5

## 2016-10-01 MED ORDER — HEPARIN SODIUM (PORCINE) 1000 UNIT/ML IJ SOLN
INTRAMUSCULAR | Status: AC
  Filled 2016-10-01: qty 3

## 2016-10-01 MED ORDER — PROTAMINE SULFATE 10 MG/ML IV SOLN
INTRAVENOUS | Status: DC | PRN
  Administered 2016-10-01: 30 mg via INTRAVENOUS

## 2016-10-01 SURGICAL SUPPLY — 34 items
ARMBAND PINK RESTRICT EXTREMIT (MISCELLANEOUS) ×2 IMPLANT
CANISTER SUCT 3000ML PPV (MISCELLANEOUS) ×2 IMPLANT
CANNULA VESSEL 3MM 2 BLNT TIP (CANNULA) ×2 IMPLANT
CLIP VESOCCLUDE MED 6/CT (CLIP) ×2 IMPLANT
CLIP VESOCCLUDE SM WIDE 6/CT (CLIP) ×2 IMPLANT
COVER PROBE W GEL 5X96 (DRAPES) IMPLANT
DERMABOND ADVANCED (GAUZE/BANDAGES/DRESSINGS) ×1
DERMABOND ADVANCED .7 DNX12 (GAUZE/BANDAGES/DRESSINGS) ×1 IMPLANT
ELECT REM PT RETURN 9FT ADLT (ELECTROSURGICAL) ×2
ELECTRODE REM PT RTRN 9FT ADLT (ELECTROSURGICAL) ×1 IMPLANT
GLOVE BIO SURGEON STRL SZ7.5 (GLOVE) ×2 IMPLANT
GLOVE BIOGEL PI IND STRL 6.5 (GLOVE) ×2 IMPLANT
GLOVE BIOGEL PI IND STRL 7.0 (GLOVE) ×1 IMPLANT
GLOVE BIOGEL PI IND STRL 8 (GLOVE) ×1 IMPLANT
GLOVE BIOGEL PI INDICATOR 6.5 (GLOVE) ×2
GLOVE BIOGEL PI INDICATOR 7.0 (GLOVE) ×1
GLOVE BIOGEL PI INDICATOR 8 (GLOVE) ×1
GLOVE SURG SS PI 6.5 STRL IVOR (GLOVE) ×2 IMPLANT
GOWN STRL REUS W/ TWL LRG LVL3 (GOWN DISPOSABLE) ×3 IMPLANT
GOWN STRL REUS W/TWL LRG LVL3 (GOWN DISPOSABLE) ×3
KIT BASIN OR (CUSTOM PROCEDURE TRAY) ×2 IMPLANT
KIT ROOM TURNOVER OR (KITS) ×2 IMPLANT
NEEDLE HYPO 25GX1X1/2 BEV (NEEDLE) ×2 IMPLANT
NS IRRIG 1000ML POUR BTL (IV SOLUTION) ×2 IMPLANT
PACK CV ACCESS (CUSTOM PROCEDURE TRAY) ×2 IMPLANT
PAD ARMBOARD 7.5X6 YLW CONV (MISCELLANEOUS) ×4 IMPLANT
SPONGE SURGIFOAM ABS GEL 100 (HEMOSTASIS) IMPLANT
SUT PROLENE 5 0 C 1 24 (SUTURE) ×4 IMPLANT
SUT PROLENE 6 0 BV (SUTURE) ×2 IMPLANT
SUT VIC AB 3-0 SH 27 (SUTURE) ×1
SUT VIC AB 3-0 SH 27X BRD (SUTURE) ×1 IMPLANT
SUT VICRYL 4-0 PS2 18IN ABS (SUTURE) ×2 IMPLANT
UNDERPAD 30X30 (UNDERPADS AND DIAPERS) ×2 IMPLANT
WATER STERILE IRR 1000ML POUR (IV SOLUTION) ×2 IMPLANT

## 2016-10-01 NOTE — Addendum Note (Signed)
Addendum  created 10/01/16 1349 by Freddie Breech, CRNA   Charge Capture section accepted

## 2016-10-01 NOTE — Interval H&P Note (Signed)
History and Physical Interval Note:  10/01/2016 7:08 AM  Erin Morgan  has presented today for surgery, with the diagnosis of ANEURYSM OF LEFT ARM ARTERIOVENOUS FISTULA  I72.9  The various methods of treatment have been discussed with the patient and family. After consideration of risks, benefits and other options for treatment, the patient has consented to  Procedure(s): REVISON OF LEFT ARM ARTERIOVENOUS FISTULA (Left) as a surgical intervention .  The patient's history has been reviewed, patient examined, no change in status, stable for surgery.  I have reviewed the patient's chart and labs.  Questions were answered to the patient's satisfaction.     [name]

## 2016-10-01 NOTE — Transfer of Care (Signed)
Immediate Anesthesia Transfer of Care Note  Patient: Erin Morgan  Procedure(s) Performed: Procedure(s): REVISON OF LEFT ARM ARTERIOVENOUS FISTULA (Left)  Patient Location: PACU  Anesthesia Type:MAC  Level of Consciousness: drowsy and patient cooperative  Airway & Oxygen Therapy: Patient Spontanous Breathing and Patient connected to face mask oxygen  Post-op Assessment: Report given to RN and Post -op Vital signs reviewed and stable  Post vital signs: Reviewed and stable  Last Vitals:  Vitals:   10/01/16 0623 10/01/16 0840  BP: (!) 180/77 139/62  Pulse: 67 71  Resp: 18 16  Temp: 36.8 C 36.5 C  SpO2: 99% 100%    Last Pain:  Vitals:   10/01/16 0840  TempSrc:   PainSc: 0-No pain         Complications: No apparent anesthesia complications

## 2016-10-01 NOTE — Op Note (Signed)
    NAME: Erin Morgan    MRN: [id number] DOB: 12/27/1955    DATE OF OPERATION: 10/01/2016  PREOP DIAGNOSIS:    Aneurysm of left upper arm AV fistula  POSTOP DIAGNOSIS:    Same  PROCEDURE:    Repair of pseudoaneurysm of left brachiocephalic AV fistula  SURGEON: Judeth Cornfield. Scot Dock, MD, FACS  ASSIST: Gerri Lins PA  ANESTHESIA: Local with sedation   EBL: Minimal  INDICATIONS:    Erin Morgan is a 61 y.o. female who presented with a large aneurysm over her left brachiocephalic AV fistula. There was compromise of the overlying skin and excision and repair of the pseudoaneurysm was recommended.  FINDINGS:    Excellent thrill at the completion of the procedure.  TECHNIQUE:    The patient was taken to the operating room and sedated by anesthesia. The left upper extremity was prepped and draped in usual sterile fashion. A fishmouth incision was made encompassing this large pseudoaneurysm. This was done transversely. This was done under local anesthesia. The fistula was controlled proximal and distal to the pseudoaneurysm and the patient was heparinized. As was a large bilobed aneurysm that emanated from the lateral aspect of the fistula. The fistula was then clamped proximally and distally. The aneurysm was excised. The vein was then repaired with running 5-0 Prolene suture such that the suture line was on the lateral wall of the fistula. Therefore, I think in 3 months the anterior wall could be cannulated. Hemostasis was obtained in the wound. The wound was closed with interrupted 3-0 Vicryl's and then the skin closed with 4-0 Vicryl. A sterile dressing was applied. The patient tolerated the procedure well and was transferred to the recovery room in stable condition. All needle and sponge counts were correct.  [name], MD, FACS [facility] Specialists of [facility]  DATE OF DICTATION:   10/01/2016

## 2016-10-01 NOTE — Anesthesia Postprocedure Evaluation (Signed)
Anesthesia Post Note  Patient: Erin Morgan  Procedure(s) Performed: Procedure(s) (LRB): REVISON OF LEFT ARM ARTERIOVENOUS FISTULA (Left)     Patient location during evaluation: PACU Anesthesia Type: MAC Level of consciousness: awake and alert Pain management: pain level controlled Vital Signs Assessment: post-procedure vital signs reviewed and stable Respiratory status: spontaneous breathing, nonlabored ventilation and respiratory function stable Cardiovascular status: stable and blood pressure returned to baseline Postop Assessment: no apparent nausea or vomiting Anesthetic complications: no    Last Vitals:  Vitals:   10/01/16 0915 10/01/16 0930  BP: (!) 143/64 137/74  Pulse: 66 70  Resp: 16 16  Temp:  (!) 36.4 C  SpO2: 95% 97%    Last Pain:  Vitals:   10/01/16 0930  TempSrc:   PainSc: 0-No pain                 Fredrico Beedle,W. EDMOND

## 2016-10-01 NOTE — H&P (View-Only) (Signed)
Vascular and Vein Specialist of [facility]  Patient name: Erin Morgan MRN: [id number] DOB: [birth year] Sex: female  REASON FOR VISIT: aneurysms left upper arm fistula, requesting provider is Dr. Lorrene Reid  HPI:    Erin Morgan is a 61 y.o. female who presents for evaluation of her left upper arm fistula aneurysms. She underwent left brachial-cephalic AV fistula creation on 11/18/14 by Dr. Bridgett Larsson. She denies any issues with dialysis, specifically no bleeding issues. She reports having these aneurysms "for as long as I can remember." She denies any changes in the size of her aneurysms. She recently underwent a fistulogram by Dr. Posey Pronto at [facility] on 07/26/16 after an infiltration event. This revealed only mild cephalic arch and inflow cephalic vein stenosis that was not impairing flow.   She dialyzes on Tuesdays, Thursdays and Saturdays. She has never had any other access surgeries. Her primary nephrologist is Dr. Lorrene Reid. She is not on any blood thinners. She denies any chest pain or shortness of breath. Past medical history includes rheumatoid arthritis, hypertension and anemia.   PAST MEDICAL HISTORY:   Past Medical History:  Diagnosis Date  . Anemia   . Chronic kidney disease    dialysis - t/th/sa  . Constipation   . Family history of adverse reaction to anesthesia    mom and sister has n/v  . GERD (gastroesophageal reflux disease)   . Hypertension   . Osteopenia   . PONV (postoperative nausea and vomiting)   . Rheumatoid arthritis ([place])   . Seasonal allergies   . Wears glasses     Family History  Problem Relation Age of Onset  . Hypertension Mother   . Cervical cancer Mother   . Cancer Mother   . Hyperlipidemia Mother   . Hypertension Father   . Stroke Father   . Hyperlipidemia Father   . Rheum arthritis Other     Social History  Substance Use Topics  . Smoking status: Never Smoker  . Smokeless tobacco: Never Used  . Alcohol use No     Comment:  no    No Known  Allergies  MEDICATIONS:   Current Outpatient Prescriptions  Medication Sig Dispense Refill  . acetaminophen (TYLENOL) 500 MG tablet Take 1,000 mg by mouth every 8 (eight) hours as needed for headache.    . hydrALAZINE (APRESOLINE) 50 MG tablet Take 1 tablet (50 mg total) by mouth every 8 (eight) hours. (Patient taking differently: Take 50 mg by mouth 2 (two) times daily. ) 90 tablet 0  . hydroxychloroquine (PLAQUENIL) 200 MG tablet Take 400 mg by mouth daily.     . hydrOXYzine (ATARAX/VISTARIL) 25 MG tablet Take 25 mg by mouth 2 (two) times daily.    Marland Kitchen labetalol (NORMODYNE) 200 MG tablet Take 100 mg by mouth 2 (two) times daily.     . multivitamin (RENA-VIT) TABS tablet Take 1 tablet by mouth at bedtime. 30 each 0  . pantoprazole (PROTONIX) 40 MG tablet Take 1 tablet (40 mg total) by mouth daily. (Patient taking differently: Take 40 mg by mouth 2 (two) times daily. ) 30 tablet 1  . sevelamer carbonate (RENVELA) 800 MG tablet Take 1,600 mg by mouth 3 (three) times daily with meals. Take med when eating protein - up to 4 times daily     No current facility-administered medications for this visit.     REVIEW OF SYSTEMS:   REVIEW OF SYSTEMS (negative unless checked):   Cardiac:  []  Chest pain or chest pressure? []  Shortness of  breath upon activity? []  Shortness of breath when lying flat? []  Irregular heart rhythm?  Vascular:  []  Pain in calf, thigh, or hip brought on by walking? []  Pain in feet at night that wakes you up from your sleep? []  Blood clot in your veins? []  Leg swelling?  Pulmonary:  []  Oxygen at home? []  Productive cough? []  Wheezing?  Neurologic:  []  Sudden weakness in arms or legs? []  Sudden numbness in arms or legs? []  Sudden onset of difficult speaking or slurred speech? []  Temporary loss of vision in one eye? []  Problems with dizziness?  Gastrointestinal:  []  Blood in stool? []  Vomited blood?  Genitourinary:  []  Burning when urinating? []  Blood in  urine?  Psychiatric:  []  Major depression  Hematologic:  []  Bleeding problems? []  Problems with blood clotting?  Dermatologic:  []  Rashes or ulcers?  Constitutional:  []  Fever or chills?  Ear/Nose/Throat:  []  Change in hearing? []  Nose bleeds? []  Sore throat?  Musculoskeletal:  []  Back pain? []  Joint pain? []  Muscle pain?  PHYSICAL EXAM:   Vitals:   09/01/16 1338  BP: 140/77  Pulse: 70  Temp: 98 F (36.7 C)  Weight: 129 lb 1.6 oz (58.6 kg)  Height: 5\' 4"  (1.626 m)    GENERAL: The patient is a well-nourished female, in no acute distress. The vital signs are documented above. HEENT: normocephalic, atraumatic. No abnormalities noted.  CARDIAC: There is a regular rate and rhythm. Systolic murmur heard. No carotid bruits.  VASCULAR: 2+ radial pulses bilaterally. Palpable thrill left upper arm fistula. Distal aneurysm is larger with skin thinning. There is another aneurysm more proximal to this and it is smaller without skin thinning. No scabs or ulcerations on aneurysms. Palpable thrill throughout fistula.  PULMONARY: There is good air exchange bilaterally without wheezing or rales. MUSCULOSKELETAL: There are no major deformities or cyanosis. NEUROLOGIC: No focal weakness or paresthesias are detected. SKIN: There are no ulcers or rashes noted. PSYCHIATRIC: The patient has a normal affect.  DATA:    Fistulogram report from 07/26/16 at [facility] reviewed. This revealed mild cephalic arm and inflow cephalic vein stenosis that was not flow limiting at this time.   ASSESSMENT/PLAN:   Left upper arm fistula aneurysm x 2  The distal aneurysm is larger with some skin thinning. She denies any changes in the aneurysm size over the last several weeks. Denies any bleeding issues. The proximal aneurysm is smaller. Had fistulogram on 7/23 at [facility] without evidence of significant stenosis in fistula or outflow vein. Would recommend surgical repair of larger distal aneurysm  to reduce risk of bleeding. She will not have enough room to continue cannulating her fistula if both aneurysm are repaired. She dialyzes on Tuesdays, Thursdays and Saturdays. She is agreeable to proceed. She will be scheduled for revision of her left upper arm fistula on 10/01/16 with Dr. Scot Dock.    Would continue cannulating fistula away from both aneurysms.   Virgina Jock, PA-C [facility] MD: Scot Dock  I have interviewed the patient and examined the patient. I agree with the findings by the PA. The patient has a fairly large aneurysm in her proximal fistula that I think is at risk for continued expansion. This could potentially compromise the overlying skin and put her at risk for bleeding. This reason I have recommended revision and plication of the aneurysm. We have discussed the other possibility that she would require patch angioplasty of this area. There should be  enough room to cannulate the fistula above and below the revision so she should not require a catheter. We have discussed the indications for the procedure and the potential complications including the risk of thrombosis of the fistula. I tended to schedule this for this Friday over she has other commitments. We were unable to schedule this until 10/01/2016. I've explained that if there is any change in the aneurysm and certainly we would want to know him we could try to schedule it sooner on a Monday Wednesday or Friday potentially with one of my partners.  Gae Gallop, MD [phone]

## 2016-10-01 NOTE — Progress Notes (Signed)
dialtsis access record faxed to [facility]

## 2016-10-02 ENCOUNTER — Encounter (HOSPITAL_COMMUNITY): Payer: Self-pay | Admitting: Vascular Surgery

## 2016-12-06 ENCOUNTER — Other Ambulatory Visit: Payer: Self-pay | Admitting: Nephrology

## 2016-12-06 ENCOUNTER — Ambulatory Visit
Admission: RE | Admit: 2016-12-06 | Discharge: 2016-12-06 | Disposition: A | Discharge status: Home / Self Care | Payer: BC Managed Care – PPO | Source: Ambulatory Visit | Attending: Nephrology | Admitting: Nephrology

## 2016-12-06 DIAGNOSIS — R05 Cough: Secondary | ICD-10-CM

## 2016-12-06 DIAGNOSIS — R059 Cough, unspecified: Secondary | ICD-10-CM

## 2017-01-12 ENCOUNTER — Ambulatory Visit (INDEPENDENT_AMBULATORY_CARE_PROVIDER_SITE_OTHER): Payer: BC Managed Care – PPO | Admitting: Neurology

## 2017-01-12 ENCOUNTER — Encounter: Payer: Self-pay | Admitting: Neurology

## 2017-01-12 VITALS — BP 136/83 | HR 64 | Ht 64.0 in | Wt 124.0 lb

## 2017-01-12 DIAGNOSIS — Z992 Dependence on renal dialysis: Secondary | ICD-10-CM

## 2017-01-12 DIAGNOSIS — N186 End stage renal disease: Secondary | ICD-10-CM | POA: Diagnosis not present

## 2017-01-12 DIAGNOSIS — G3281 Cerebellar ataxia in diseases classified elsewhere: Secondary | ICD-10-CM

## 2017-01-12 DIAGNOSIS — R531 Weakness: Secondary | ICD-10-CM | POA: Diagnosis not present

## 2017-01-12 NOTE — Progress Notes (Signed)
PATIENT: Erin Morgan DOB: [birth year]  Chief Complaint  Patient presents with  . Muscle Weakness    Reports weakness in quadriceps causing her difficulty stepping up and standing from a squatted position.  Symptoms present over the last year with only mild improvement.  . Nephrologist    Jamal Maes, MD - referring provider  . PCP    Rankins, Bill Salinas, MD     HISTORICAL  Erin Morgan is a 62 year old female, seen in refer by her nephrologist Dr. Henderson Newcomer for evaluation of muscle weakness, her primary care doctor is Dr. Radene Ou, [place], initial evaluation was on January 12, 2017.  I reviewed and summarized the referring note, she had a history of hypertension, rheumatoid arthritis, she presented with acute renal failure in 2016, went on hemodialysis since.  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  But since 2016, she noticed gradual onset bilateral lower extremity weakness, intermittent, initially she contributed to chronic kidney disease, deconditioning, but she still has intermittent symptoms, she described difficulty climbing up stairs, she has to rely on her hand rails, she has to be careful going up and down steps.  In October 2018, she flew to [place], when she got off the transportation bus, she almost fell, has to pull herself by handrail,  She denies significant upper extremity weakness, but she is not physically active, no longer workout regularly.  She denies double vision, no dysarthria, no dysphagia,   REVIEW OF SYSTEMS: Full 14 system review of systems performed and notable only for as above  ALLERGIES: Not on File  HOME MEDICATIONS: Current Outpatient Medications  Medication Sig Dispense Refill  . acetaminophen (TYLENOL) 500 MG tablet Take 1,000 mg by mouth every 8 (eight) hours as needed for headache.    . fluocinonide  (LIDEX) 0.05 % external solution Apply 1 application topically 2 (two) times daily as needed (MIX WITH CETAPHIL).     . hydrALAZINE (APRESOLINE) 50 MG tablet Take 1 tablet (50 mg total) by mouth every 8 (eight) hours. (Patient taking differently: Take 50 mg by mouth 2 (two) times daily. ) 90 tablet 0  . hydroxychloroquine (PLAQUENIL) 200 MG tablet Take 400 mg by mouth daily.     . hydrOXYzine (ATARAX/VISTARIL) 25 MG tablet Take 25 mg by mouth 2 (two) times daily.    Marland Kitchen labetalol (NORMODYNE) 200 MG tablet Take 200 mg by mouth 2 (two) times daily.     . multivitamin (RENA-VIT) TABS tablet Take 1 tablet by mouth at bedtime. 30 each 0  . omeprazole (PRILOSEC) 20 MG capsule Take 20 mg by mouth 2 (two) times daily before a meal.    . oxyCODONE-acetaminophen (PERCOCET/ROXICET) 5-325 MG tablet Take 1 tablet by mouth every 6 (six) hours as needed. 6 tablet 0  . sevelamer carbonate (RENVELA) 800 MG tablet Take 1,600 mg by mouth 3 (three) times daily with meals. Take med when eating protein - up to 4 times daily    . SOLIRIS 300 MG/30ML SOLN injection Inject into the vein every 14 (fourteen) days.     No current facility-administered medications for this visit.     PAST MEDICAL HISTORY: Past Medical History:  Diagnosis Date  . Anemia   . Chronic kidney disease    dialysis - t/th/sa- [facility]  . Constipation   . Family history of adverse reaction to anesthesia  mom and sister has n/v  . GERD (gastroesophageal reflux disease)   . History of blood transfusion   . Hypertension   . Muscle weakness   . Osteopenia   . PONV (postoperative nausea and vomiting)    Headache  . Rheumatoid arthritis ([place])   . Seasonal allergies   . Wears glasses     PAST SURGICAL HISTORY: Past Surgical History:  Procedure Laterality Date  . AV FISTULA PLACEMENT Left 11/18/2014   Procedure: BRACHIOCEPHALIC ARTERIOVENOUS (AV) FISTULA CREATION;  Surgeon: Conrad Herculaneum, MD;  Location: [facility];  Service:  Vascular;  Laterality: Left;  . CARPAL TUNNEL RELEASE Right 04/05/2014   Procedure: RIGHT CARPAL TUNNEL RELEASE;  Surgeon: Daryll Brod, MD;  Location: [facility];  Service: Orthopedics;  Laterality: Right;  ANESTHESIA:  IV REGIONAL FAB  . COLONOSCOPY    . REVISON OF ARTERIOVENOUS FISTULA Left 10/01/2016   Procedure: REVISON OF LEFT ARM ARTERIOVENOUS FISTULA;  Surgeon: Angelia Mould, MD;  Location: [facility];  Service: Vascular;  Laterality: Left;  . TONSILLECTOMY    . TUMOR EXCISION  2000   right foot    FAMILY HISTORY: Family History  Problem Relation Age of Onset  . Hypertension Mother   . Cervical cancer Mother   . Cancer Mother   . Hyperlipidemia Mother   . Hypertension Father   . Stroke Father   . Hyperlipidemia Father   . Rheum arthritis Other     SOCIAL HISTORY:  Social History   Socioeconomic History  . Marital status: Widowed    Spouse name: Not on file  . Number of children: 0  . Years of education: 12  . Highest education level: Doctorate  Social Needs  . Financial resource strain: Not on file  . Food insecurity - worry: Not on file  . Food insecurity - inability: Not on file  . Transportation needs - medical: Not on file  . Transportation needs - non-medical: Not on file  Occupational History  . Not on file  Tobacco Use  . Smoking status: Never Smoker  . Smokeless tobacco: Never Used  Substance and Sexual Activity  . Alcohol use: No    Comment:  no  . Drug use: No  . Sexual activity: No  Other Topics Concern  . Not on file  Social History Narrative   Lives at home alone.   Right-handed.   Occasional caffeine use.     PHYSICAL EXAM   Vitals:   01/12/17 0806  BP: 136/83  Pulse: 64  Weight: 124 lb (56.2 kg)  Height: 5\' 4"  (1.626 m)    Not recorded      Body mass index is 21.28 kg/m.  PHYSICAL EXAMNIATION:  Gen: NAD, conversant, well nourised, obese, well groomed                     Cardiovascular: Regular rate  rhythm, no peripheral edema, warm, nontender. Eyes: Conjunctivae clear without exudates or hemorrhage Neck: Supple, no carotid bruits. Pulmonary: Clear to auscultation bilaterally   NEUROLOGICAL EXAM:  MENTAL STATUS: Speech:    Speech is normal; fluent and spontaneous with normal comprehension.  Cognition:     Orientation to time, place and person     Normal recent and remote memory     Normal Attention span and concentration     Normal Language, naming, repeating,spontaneous speech     Fund of knowledge   CRANIAL NERVES: CN II: Visual fields are full to confrontation. Fundoscopic  exam is normal with sharp discs and no vascular changes. Pupils are round equal and briskly reactive to light. CN III, IV, VI: extraocular movement are normal. No ptosis. CN V: Facial sensation is intact to pinprick in all 3 divisions bilaterally. Corneal responses are intact.  CN VII: Face is symmetric with normal eye closure and smile. CN VIII: Hearing is normal to rubbing fingers CN IX, X: Palate elevates symmetrically. Phonation is normal. CN XI: Head turning and shoulder shrug are intact CN XII: Tongue is midline with normal movements and no atrophy.  MOTOR: There is no pronator drift of out-stretched arms. Muscle bulk and tone are normal. Muscle strength is normal.  REFLEXES: Reflexes are 2+ and symmetric at the biceps, triceps, knees, and ankles. Plantar responses are flexor.  SENSORY: Intact to light touch, pinprick, positional sensation and vibratory sensation are intact in fingers and toes.  COORDINATION: Rapid alternating movements and fine finger movements are intact. There is no dysmetria on finger-to-nose and heel-knee-shin.    GAIT/STANCE: She can get up from seated position arm crossed, no difficulty standing on tiptoes, heels, no difficulty with tandem walking. Romberg is absent.   DIAGNOSTIC DATA (LABS, IMAGING, TESTING) - I reviewed patient records, labs, notes, testing and  imaging myself where available.   ASSESSMENT AND PLAN  Erin Morgan is a 62 y.o. female   Muscle weakness, mainly involving bilateral lower extremity  Need to rule out cervical spondylitic myelopathy, lumbar radiculopathy,  Laboratory evaluation for inflammatory markers, CPK, rule out neuromuscular junctional disorder   Differentiation diagnosis also include deconditioning, I have suggested her moderate exercise daily,  Marcial Pacas, M.D. Ph.D.  [facility] [address] [place], [place] [address] Ph: [phone] Fax: [phone]  CC: Rankins, Bill Salinas, MD

## 2017-01-20 ENCOUNTER — Other Ambulatory Visit (INDEPENDENT_AMBULATORY_CARE_PROVIDER_SITE_OTHER): Payer: Self-pay

## 2017-01-20 ENCOUNTER — Other Ambulatory Visit: Payer: Self-pay | Admitting: *Deleted

## 2017-01-20 DIAGNOSIS — R531 Weakness: Secondary | ICD-10-CM

## 2017-01-20 DIAGNOSIS — G3281 Cerebellar ataxia in diseases classified elsewhere: Secondary | ICD-10-CM

## 2017-01-20 DIAGNOSIS — E559 Vitamin D deficiency, unspecified: Secondary | ICD-10-CM

## 2017-01-20 DIAGNOSIS — N186 End stage renal disease: Secondary | ICD-10-CM

## 2017-01-20 DIAGNOSIS — Z992 Dependence on renal dialysis: Principal | ICD-10-CM

## 2017-01-20 DIAGNOSIS — E538 Deficiency of other specified B group vitamins: Secondary | ICD-10-CM

## 2017-01-20 DIAGNOSIS — Z0289 Encounter for other administrative examinations: Secondary | ICD-10-CM

## 2017-01-21 LAB — CK: Total CK: 224 U/L — ABNORMAL HIGH (ref 24–173)

## 2017-01-21 LAB — ANA W/REFLEX IF POSITIVE: Anti Nuclear Antibody(ANA): NEGATIVE

## 2017-01-21 LAB — THYROID PANEL WITH TSH
Free Thyroxine Index: 1.6 (ref 1.2–4.9)
T3 UPTAKE RATIO: 31 % (ref 24–39)
T4 TOTAL: 5.1 ug/dL (ref 4.5–12.0)
TSH: 1.22 u[IU]/mL (ref 0.450–4.500)

## 2017-01-21 LAB — ACETYLCHOLINE RECEPTOR, BINDING

## 2017-01-21 LAB — VITAMIN B12: VITAMIN B 12: 565 pg/mL (ref 232–1245)

## 2017-01-21 LAB — VITAMIN D 25 HYDROXY (VIT D DEFICIENCY, FRACTURES): Vit D, 25-Hydroxy: 37.8 ng/mL (ref 30.0–100.0)

## 2017-01-24 ENCOUNTER — Telehealth: Payer: Self-pay | Admitting: Neurology

## 2017-01-24 NOTE — Telephone Encounter (Signed)
Please call patient, extensive laboratory evaluation was within normal limit, the only abnormalities mild elevated CPK of 224, which can be related to her chronic kidney disease,  I do not think the mild elevated CPK is related to intrinsic muscle disease, we oftentimes become concerned when the CPK level is about 5 times of normal range, such as more than [phone] unit/L.  If she wish, I can potentially repeat CPK level,

## 2017-01-24 NOTE — Telephone Encounter (Signed)
Spoke to patient - she is aware of the lab results.  States her CPK has been mildly elevated in the past and she does not feel repeating the lab is necessary.

## 2017-01-29 ENCOUNTER — Ambulatory Visit
Admission: RE | Admit: 2017-01-29 | Discharge: 2017-01-29 | Disposition: A | Discharge status: Home / Self Care | Payer: BC Managed Care – PPO | Source: Ambulatory Visit | Attending: Neurology | Admitting: Neurology

## 2017-01-29 DIAGNOSIS — G3281 Cerebellar ataxia in diseases classified elsewhere: Secondary | ICD-10-CM | POA: Diagnosis not present

## 2017-01-31 ENCOUNTER — Telehealth: Payer: Self-pay | Admitting: Neurology

## 2017-01-31 NOTE — Telephone Encounter (Signed)
Spoke to patient she is aware of results

## 2017-01-31 NOTE — Telephone Encounter (Signed)
Left message requesting a return call to discuss results. 

## 2017-01-31 NOTE — Telephone Encounter (Signed)
Please call patient, MRI of the cervical spine showed mild degenerative changes, mild stenosis at C6-7 level, but there is no evidence of spinal cord or nerve root compression  MRI of lumbar spine also showed degenerative changes, most severe at L4-5 level, anterolisthesis of L4 upon L5, there is no evidence of nerve root compression.   IMPRESSION:  This MRI of the cervical spine without contrast shows multilevel degenerative changes as detailed above. The most significant findings are:: 1.    The spinal cord appears normal. 2.    There is mild to moderate spinal stenosis at C6-C7 and mild spinal stenosis at C4-C5, C5-C6 and C7-T1.    There is no nerve root compression at these levels.   IMPRESSION:  This MRI of the lumbar spine without contrast shows the following: 1.    At L4-L5, there is mild spinal stenosis due to anterolisthesis of L4 upon L5, disc bulging, facet hypertrophy and left ligamentum flavum hypertrophy. There does not appear to be any nerve root compression. 2.    There are milder degenerative changes at L2-L3, L3-L4 and L5S1 that do not lead to spinal stenosis or nerve root compression.

## 2017-04-11 DIAGNOSIS — N186 End stage renal disease: Secondary | ICD-10-CM | POA: Diagnosis not present

## 2017-04-11 DIAGNOSIS — N289 Disorder of kidney and ureter, unspecified: Secondary | ICD-10-CM | POA: Diagnosis not present

## 2017-04-11 DIAGNOSIS — I150 Renovascular hypertension: Secondary | ICD-10-CM | POA: Diagnosis not present

## 2017-04-11 DIAGNOSIS — Z992 Dependence on renal dialysis: Secondary | ICD-10-CM | POA: Diagnosis not present

## 2017-05-05 ENCOUNTER — Ambulatory Visit: Payer: BC Managed Care – PPO | Admitting: Vascular Surgery

## 2017-05-06 DIAGNOSIS — N189 Chronic kidney disease, unspecified: Secondary | ICD-10-CM | POA: Diagnosis not present

## 2017-05-06 DIAGNOSIS — N19 Unspecified kidney failure: Secondary | ICD-10-CM | POA: Diagnosis not present

## 2017-05-06 DIAGNOSIS — Z4902 Encounter for fitting and adjustment of peritoneal dialysis catheter: Secondary | ICD-10-CM | POA: Diagnosis not present

## 2017-05-06 DIAGNOSIS — I120 Hypertensive chronic kidney disease with stage 5 chronic kidney disease or end stage renal disease: Secondary | ICD-10-CM | POA: Diagnosis not present

## 2017-05-06 DIAGNOSIS — N186 End stage renal disease: Secondary | ICD-10-CM | POA: Diagnosis not present

## 2017-05-06 DIAGNOSIS — E8770 Fluid overload, unspecified: Secondary | ICD-10-CM | POA: Diagnosis not present

## 2017-05-06 DIAGNOSIS — Z992 Dependence on renal dialysis: Secondary | ICD-10-CM | POA: Diagnosis not present

## 2017-05-27 ENCOUNTER — Ambulatory Visit: Payer: BC Managed Care – PPO | Admitting: Vascular Surgery

## 2017-06-08 ENCOUNTER — Ambulatory Visit
Admission: RE | Admit: 2017-06-08 | Discharge: 2017-06-08 | Disposition: A | Discharge status: Home / Self Care | Payer: BC Managed Care – PPO | Source: Ambulatory Visit | Attending: Nephrology | Admitting: Nephrology

## 2017-06-08 ENCOUNTER — Other Ambulatory Visit: Payer: Self-pay | Admitting: Nephrology

## 2017-06-08 DIAGNOSIS — Z0000 Encounter for general adult medical examination without abnormal findings: Secondary | ICD-10-CM

## 2017-06-08 DIAGNOSIS — A150 Tuberculosis of lung: Secondary | ICD-10-CM

## 2017-06-10 ENCOUNTER — Other Ambulatory Visit: Payer: Self-pay | Admitting: Nephrology

## 2017-06-10 DIAGNOSIS — R918 Other nonspecific abnormal finding of lung field: Secondary | ICD-10-CM

## 2017-06-24 ENCOUNTER — Other Ambulatory Visit: Payer: BC Managed Care – PPO

## 2017-06-29 ENCOUNTER — Ambulatory Visit: Payer: BC Managed Care – PPO | Admitting: Vascular Surgery

## 2017-07-04 DIAGNOSIS — N2589 Other disorders resulting from impaired renal tubular function: Secondary | ICD-10-CM | POA: Diagnosis not present

## 2017-07-04 DIAGNOSIS — Z4932 Encounter for adequacy testing for peritoneal dialysis: Secondary | ICD-10-CM | POA: Diagnosis not present

## 2017-07-04 DIAGNOSIS — D509 Iron deficiency anemia, unspecified: Secondary | ICD-10-CM | POA: Diagnosis not present

## 2017-07-04 DIAGNOSIS — Z79899 Other long term (current) drug therapy: Secondary | ICD-10-CM | POA: Diagnosis not present

## 2017-07-04 DIAGNOSIS — N2581 Secondary hyperparathyroidism of renal origin: Secondary | ICD-10-CM | POA: Diagnosis not present

## 2017-07-04 DIAGNOSIS — N186 End stage renal disease: Secondary | ICD-10-CM | POA: Diagnosis not present

## 2017-07-04 DIAGNOSIS — E440 Moderate protein-calorie malnutrition: Secondary | ICD-10-CM | POA: Diagnosis not present

## 2017-07-04 DIAGNOSIS — D631 Anemia in chronic kidney disease: Secondary | ICD-10-CM | POA: Diagnosis not present

## 2017-07-04 DIAGNOSIS — R17 Unspecified jaundice: Secondary | ICD-10-CM | POA: Diagnosis not present

## 2017-07-05 DIAGNOSIS — D631 Anemia in chronic kidney disease: Secondary | ICD-10-CM | POA: Diagnosis not present

## 2017-07-05 DIAGNOSIS — N186 End stage renal disease: Secondary | ICD-10-CM | POA: Diagnosis not present

## 2017-07-05 DIAGNOSIS — D509 Iron deficiency anemia, unspecified: Secondary | ICD-10-CM | POA: Diagnosis not present

## 2017-07-05 DIAGNOSIS — Z4932 Encounter for adequacy testing for peritoneal dialysis: Secondary | ICD-10-CM | POA: Diagnosis not present

## 2017-07-05 DIAGNOSIS — N2581 Secondary hyperparathyroidism of renal origin: Secondary | ICD-10-CM | POA: Diagnosis not present

## 2017-07-05 DIAGNOSIS — N2589 Other disorders resulting from impaired renal tubular function: Secondary | ICD-10-CM | POA: Diagnosis not present

## 2017-07-06 DIAGNOSIS — N186 End stage renal disease: Secondary | ICD-10-CM | POA: Diagnosis not present

## 2017-07-06 DIAGNOSIS — N2589 Other disorders resulting from impaired renal tubular function: Secondary | ICD-10-CM | POA: Diagnosis not present

## 2017-07-06 DIAGNOSIS — D631 Anemia in chronic kidney disease: Secondary | ICD-10-CM | POA: Diagnosis not present

## 2017-07-06 DIAGNOSIS — D509 Iron deficiency anemia, unspecified: Secondary | ICD-10-CM | POA: Diagnosis not present

## 2017-07-06 DIAGNOSIS — Z4932 Encounter for adequacy testing for peritoneal dialysis: Secondary | ICD-10-CM | POA: Diagnosis not present

## 2017-07-06 DIAGNOSIS — N2581 Secondary hyperparathyroidism of renal origin: Secondary | ICD-10-CM | POA: Diagnosis not present

## 2017-07-07 DIAGNOSIS — N186 End stage renal disease: Secondary | ICD-10-CM | POA: Diagnosis not present

## 2017-07-07 DIAGNOSIS — N2589 Other disorders resulting from impaired renal tubular function: Secondary | ICD-10-CM | POA: Diagnosis not present

## 2017-07-07 DIAGNOSIS — N2581 Secondary hyperparathyroidism of renal origin: Secondary | ICD-10-CM | POA: Diagnosis not present

## 2017-07-07 DIAGNOSIS — D509 Iron deficiency anemia, unspecified: Secondary | ICD-10-CM | POA: Diagnosis not present

## 2017-07-07 DIAGNOSIS — D631 Anemia in chronic kidney disease: Secondary | ICD-10-CM | POA: Diagnosis not present

## 2017-07-07 DIAGNOSIS — Z4932 Encounter for adequacy testing for peritoneal dialysis: Secondary | ICD-10-CM | POA: Diagnosis not present

## 2017-07-08 DIAGNOSIS — D509 Iron deficiency anemia, unspecified: Secondary | ICD-10-CM | POA: Diagnosis not present

## 2017-07-08 DIAGNOSIS — D631 Anemia in chronic kidney disease: Secondary | ICD-10-CM | POA: Diagnosis not present

## 2017-07-08 DIAGNOSIS — N186 End stage renal disease: Secondary | ICD-10-CM | POA: Diagnosis not present

## 2017-07-08 DIAGNOSIS — Z4932 Encounter for adequacy testing for peritoneal dialysis: Secondary | ICD-10-CM | POA: Diagnosis not present

## 2017-07-08 DIAGNOSIS — N2581 Secondary hyperparathyroidism of renal origin: Secondary | ICD-10-CM | POA: Diagnosis not present

## 2017-07-08 DIAGNOSIS — N2589 Other disorders resulting from impaired renal tubular function: Secondary | ICD-10-CM | POA: Diagnosis not present

## 2017-07-09 DIAGNOSIS — D509 Iron deficiency anemia, unspecified: Secondary | ICD-10-CM | POA: Diagnosis not present

## 2017-07-09 DIAGNOSIS — N186 End stage renal disease: Secondary | ICD-10-CM | POA: Diagnosis not present

## 2017-07-09 DIAGNOSIS — N2581 Secondary hyperparathyroidism of renal origin: Secondary | ICD-10-CM | POA: Diagnosis not present

## 2017-07-09 DIAGNOSIS — N2589 Other disorders resulting from impaired renal tubular function: Secondary | ICD-10-CM | POA: Diagnosis not present

## 2017-07-09 DIAGNOSIS — D631 Anemia in chronic kidney disease: Secondary | ICD-10-CM | POA: Diagnosis not present

## 2017-07-09 DIAGNOSIS — Z4932 Encounter for adequacy testing for peritoneal dialysis: Secondary | ICD-10-CM | POA: Diagnosis not present

## 2017-07-10 DIAGNOSIS — N2581 Secondary hyperparathyroidism of renal origin: Secondary | ICD-10-CM | POA: Diagnosis not present

## 2017-07-10 DIAGNOSIS — Z4932 Encounter for adequacy testing for peritoneal dialysis: Secondary | ICD-10-CM | POA: Diagnosis not present

## 2017-07-10 DIAGNOSIS — N186 End stage renal disease: Secondary | ICD-10-CM | POA: Diagnosis not present

## 2017-07-10 DIAGNOSIS — D509 Iron deficiency anemia, unspecified: Secondary | ICD-10-CM | POA: Diagnosis not present

## 2017-07-10 DIAGNOSIS — D631 Anemia in chronic kidney disease: Secondary | ICD-10-CM | POA: Diagnosis not present

## 2017-07-10 DIAGNOSIS — N2589 Other disorders resulting from impaired renal tubular function: Secondary | ICD-10-CM | POA: Diagnosis not present

## 2017-07-11 DIAGNOSIS — N186 End stage renal disease: Secondary | ICD-10-CM | POA: Diagnosis not present

## 2017-07-11 DIAGNOSIS — Z4932 Encounter for adequacy testing for peritoneal dialysis: Secondary | ICD-10-CM | POA: Diagnosis not present

## 2017-07-11 DIAGNOSIS — N2581 Secondary hyperparathyroidism of renal origin: Secondary | ICD-10-CM | POA: Diagnosis not present

## 2017-07-11 DIAGNOSIS — E7849 Other hyperlipidemia: Secondary | ICD-10-CM | POA: Diagnosis not present

## 2017-07-11 DIAGNOSIS — D509 Iron deficiency anemia, unspecified: Secondary | ICD-10-CM | POA: Diagnosis not present

## 2017-07-11 DIAGNOSIS — N2589 Other disorders resulting from impaired renal tubular function: Secondary | ICD-10-CM | POA: Diagnosis not present

## 2017-07-11 DIAGNOSIS — D631 Anemia in chronic kidney disease: Secondary | ICD-10-CM | POA: Diagnosis not present

## 2017-07-12 DIAGNOSIS — N2581 Secondary hyperparathyroidism of renal origin: Secondary | ICD-10-CM | POA: Diagnosis not present

## 2017-07-12 DIAGNOSIS — D509 Iron deficiency anemia, unspecified: Secondary | ICD-10-CM | POA: Diagnosis not present

## 2017-07-12 DIAGNOSIS — Z4932 Encounter for adequacy testing for peritoneal dialysis: Secondary | ICD-10-CM | POA: Diagnosis not present

## 2017-07-12 DIAGNOSIS — N186 End stage renal disease: Secondary | ICD-10-CM | POA: Diagnosis not present

## 2017-07-12 DIAGNOSIS — N2589 Other disorders resulting from impaired renal tubular function: Secondary | ICD-10-CM | POA: Diagnosis not present

## 2017-07-12 DIAGNOSIS — D631 Anemia in chronic kidney disease: Secondary | ICD-10-CM | POA: Diagnosis not present

## 2017-07-13 DIAGNOSIS — D509 Iron deficiency anemia, unspecified: Secondary | ICD-10-CM | POA: Diagnosis not present

## 2017-07-13 DIAGNOSIS — N2581 Secondary hyperparathyroidism of renal origin: Secondary | ICD-10-CM | POA: Diagnosis not present

## 2017-07-13 DIAGNOSIS — N186 End stage renal disease: Secondary | ICD-10-CM | POA: Diagnosis not present

## 2017-07-13 DIAGNOSIS — N2589 Other disorders resulting from impaired renal tubular function: Secondary | ICD-10-CM | POA: Diagnosis not present

## 2017-07-13 DIAGNOSIS — Z4932 Encounter for adequacy testing for peritoneal dialysis: Secondary | ICD-10-CM | POA: Diagnosis not present

## 2017-07-13 DIAGNOSIS — D631 Anemia in chronic kidney disease: Secondary | ICD-10-CM | POA: Diagnosis not present

## 2017-07-14 DIAGNOSIS — D509 Iron deficiency anemia, unspecified: Secondary | ICD-10-CM | POA: Diagnosis not present

## 2017-07-14 DIAGNOSIS — N2589 Other disorders resulting from impaired renal tubular function: Secondary | ICD-10-CM | POA: Diagnosis not present

## 2017-07-14 DIAGNOSIS — D631 Anemia in chronic kidney disease: Secondary | ICD-10-CM | POA: Diagnosis not present

## 2017-07-14 DIAGNOSIS — N2581 Secondary hyperparathyroidism of renal origin: Secondary | ICD-10-CM | POA: Diagnosis not present

## 2017-07-14 DIAGNOSIS — Z4932 Encounter for adequacy testing for peritoneal dialysis: Secondary | ICD-10-CM | POA: Diagnosis not present

## 2017-07-14 DIAGNOSIS — N186 End stage renal disease: Secondary | ICD-10-CM | POA: Diagnosis not present

## 2017-07-15 DIAGNOSIS — N186 End stage renal disease: Secondary | ICD-10-CM | POA: Diagnosis not present

## 2017-07-15 DIAGNOSIS — D631 Anemia in chronic kidney disease: Secondary | ICD-10-CM | POA: Diagnosis not present

## 2017-07-15 DIAGNOSIS — N2589 Other disorders resulting from impaired renal tubular function: Secondary | ICD-10-CM | POA: Diagnosis not present

## 2017-07-15 DIAGNOSIS — D509 Iron deficiency anemia, unspecified: Secondary | ICD-10-CM | POA: Diagnosis not present

## 2017-07-15 DIAGNOSIS — Z4932 Encounter for adequacy testing for peritoneal dialysis: Secondary | ICD-10-CM | POA: Diagnosis not present

## 2017-07-15 DIAGNOSIS — N2581 Secondary hyperparathyroidism of renal origin: Secondary | ICD-10-CM | POA: Diagnosis not present

## 2017-07-16 DIAGNOSIS — Z4932 Encounter for adequacy testing for peritoneal dialysis: Secondary | ICD-10-CM | POA: Diagnosis not present

## 2017-07-16 DIAGNOSIS — N2589 Other disorders resulting from impaired renal tubular function: Secondary | ICD-10-CM | POA: Diagnosis not present

## 2017-07-16 DIAGNOSIS — N186 End stage renal disease: Secondary | ICD-10-CM | POA: Diagnosis not present

## 2017-07-16 DIAGNOSIS — N2581 Secondary hyperparathyroidism of renal origin: Secondary | ICD-10-CM | POA: Diagnosis not present

## 2017-07-16 DIAGNOSIS — D509 Iron deficiency anemia, unspecified: Secondary | ICD-10-CM | POA: Diagnosis not present

## 2017-07-16 DIAGNOSIS — D631 Anemia in chronic kidney disease: Secondary | ICD-10-CM | POA: Diagnosis not present

## 2017-07-17 DIAGNOSIS — D509 Iron deficiency anemia, unspecified: Secondary | ICD-10-CM | POA: Diagnosis not present

## 2017-07-17 DIAGNOSIS — N2581 Secondary hyperparathyroidism of renal origin: Secondary | ICD-10-CM | POA: Diagnosis not present

## 2017-07-17 DIAGNOSIS — Z4932 Encounter for adequacy testing for peritoneal dialysis: Secondary | ICD-10-CM | POA: Diagnosis not present

## 2017-07-17 DIAGNOSIS — N2589 Other disorders resulting from impaired renal tubular function: Secondary | ICD-10-CM | POA: Diagnosis not present

## 2017-07-17 DIAGNOSIS — N186 End stage renal disease: Secondary | ICD-10-CM | POA: Diagnosis not present

## 2017-07-17 DIAGNOSIS — D631 Anemia in chronic kidney disease: Secondary | ICD-10-CM | POA: Diagnosis not present

## 2017-07-18 ENCOUNTER — Ambulatory Visit
Admission: RE | Admit: 2017-07-18 | Discharge: 2017-07-18 | Disposition: A | Discharge status: Home / Self Care | Payer: BC Managed Care – PPO | Source: Ambulatory Visit | Attending: Nephrology | Admitting: Nephrology

## 2017-07-18 DIAGNOSIS — Z4932 Encounter for adequacy testing for peritoneal dialysis: Secondary | ICD-10-CM | POA: Diagnosis not present

## 2017-07-18 DIAGNOSIS — R918 Other nonspecific abnormal finding of lung field: Secondary | ICD-10-CM

## 2017-07-18 DIAGNOSIS — N2581 Secondary hyperparathyroidism of renal origin: Secondary | ICD-10-CM | POA: Diagnosis not present

## 2017-07-18 DIAGNOSIS — D509 Iron deficiency anemia, unspecified: Secondary | ICD-10-CM | POA: Diagnosis not present

## 2017-07-18 DIAGNOSIS — N2589 Other disorders resulting from impaired renal tubular function: Secondary | ICD-10-CM | POA: Diagnosis not present

## 2017-07-18 DIAGNOSIS — D631 Anemia in chronic kidney disease: Secondary | ICD-10-CM | POA: Diagnosis not present

## 2017-07-18 DIAGNOSIS — N186 End stage renal disease: Secondary | ICD-10-CM | POA: Diagnosis not present

## 2017-07-18 DIAGNOSIS — B0089 Other herpesviral infection: Secondary | ICD-10-CM | POA: Diagnosis not present

## 2017-07-18 DIAGNOSIS — R911 Solitary pulmonary nodule: Secondary | ICD-10-CM | POA: Diagnosis not present

## 2017-07-18 DIAGNOSIS — I712 Thoracic aortic aneurysm, without rupture, unspecified: Secondary | ICD-10-CM

## 2017-07-18 HISTORY — DX: Thoracic aortic aneurysm, without rupture: I71.2

## 2017-07-18 HISTORY — DX: Thoracic aortic aneurysm, without rupture, unspecified: I71.20

## 2017-07-19 DIAGNOSIS — N2589 Other disorders resulting from impaired renal tubular function: Secondary | ICD-10-CM | POA: Diagnosis not present

## 2017-07-19 DIAGNOSIS — Z4932 Encounter for adequacy testing for peritoneal dialysis: Secondary | ICD-10-CM | POA: Diagnosis not present

## 2017-07-19 DIAGNOSIS — N2581 Secondary hyperparathyroidism of renal origin: Secondary | ICD-10-CM | POA: Diagnosis not present

## 2017-07-19 DIAGNOSIS — D509 Iron deficiency anemia, unspecified: Secondary | ICD-10-CM | POA: Diagnosis not present

## 2017-07-19 DIAGNOSIS — N186 End stage renal disease: Secondary | ICD-10-CM | POA: Diagnosis not present

## 2017-07-19 DIAGNOSIS — D631 Anemia in chronic kidney disease: Secondary | ICD-10-CM | POA: Diagnosis not present

## 2017-07-20 DIAGNOSIS — D509 Iron deficiency anemia, unspecified: Secondary | ICD-10-CM | POA: Diagnosis not present

## 2017-07-20 DIAGNOSIS — Z4932 Encounter for adequacy testing for peritoneal dialysis: Secondary | ICD-10-CM | POA: Diagnosis not present

## 2017-07-20 DIAGNOSIS — D631 Anemia in chronic kidney disease: Secondary | ICD-10-CM | POA: Diagnosis not present

## 2017-07-20 DIAGNOSIS — N2589 Other disorders resulting from impaired renal tubular function: Secondary | ICD-10-CM | POA: Diagnosis not present

## 2017-07-20 DIAGNOSIS — N186 End stage renal disease: Secondary | ICD-10-CM | POA: Diagnosis not present

## 2017-07-20 DIAGNOSIS — N2581 Secondary hyperparathyroidism of renal origin: Secondary | ICD-10-CM | POA: Diagnosis not present

## 2017-07-21 ENCOUNTER — Other Ambulatory Visit: Payer: BC Managed Care – PPO

## 2017-07-21 DIAGNOSIS — N186 End stage renal disease: Secondary | ICD-10-CM | POA: Diagnosis not present

## 2017-07-21 DIAGNOSIS — Z4932 Encounter for adequacy testing for peritoneal dialysis: Secondary | ICD-10-CM | POA: Diagnosis not present

## 2017-07-21 DIAGNOSIS — N2581 Secondary hyperparathyroidism of renal origin: Secondary | ICD-10-CM | POA: Diagnosis not present

## 2017-07-21 DIAGNOSIS — D509 Iron deficiency anemia, unspecified: Secondary | ICD-10-CM | POA: Diagnosis not present

## 2017-07-21 DIAGNOSIS — D631 Anemia in chronic kidney disease: Secondary | ICD-10-CM | POA: Diagnosis not present

## 2017-07-21 DIAGNOSIS — N2589 Other disorders resulting from impaired renal tubular function: Secondary | ICD-10-CM | POA: Diagnosis not present

## 2017-07-22 DIAGNOSIS — N2581 Secondary hyperparathyroidism of renal origin: Secondary | ICD-10-CM | POA: Diagnosis not present

## 2017-07-22 DIAGNOSIS — N186 End stage renal disease: Secondary | ICD-10-CM | POA: Diagnosis not present

## 2017-07-22 DIAGNOSIS — N2589 Other disorders resulting from impaired renal tubular function: Secondary | ICD-10-CM | POA: Diagnosis not present

## 2017-07-22 DIAGNOSIS — D631 Anemia in chronic kidney disease: Secondary | ICD-10-CM | POA: Diagnosis not present

## 2017-07-22 DIAGNOSIS — Z4932 Encounter for adequacy testing for peritoneal dialysis: Secondary | ICD-10-CM | POA: Diagnosis not present

## 2017-07-22 DIAGNOSIS — D509 Iron deficiency anemia, unspecified: Secondary | ICD-10-CM | POA: Diagnosis not present

## 2017-07-23 DIAGNOSIS — N186 End stage renal disease: Secondary | ICD-10-CM | POA: Diagnosis not present

## 2017-07-23 DIAGNOSIS — D631 Anemia in chronic kidney disease: Secondary | ICD-10-CM | POA: Diagnosis not present

## 2017-07-23 DIAGNOSIS — N2589 Other disorders resulting from impaired renal tubular function: Secondary | ICD-10-CM | POA: Diagnosis not present

## 2017-07-23 DIAGNOSIS — D509 Iron deficiency anemia, unspecified: Secondary | ICD-10-CM | POA: Diagnosis not present

## 2017-07-23 DIAGNOSIS — N2581 Secondary hyperparathyroidism of renal origin: Secondary | ICD-10-CM | POA: Diagnosis not present

## 2017-07-23 DIAGNOSIS — Z4932 Encounter for adequacy testing for peritoneal dialysis: Secondary | ICD-10-CM | POA: Diagnosis not present

## 2017-07-24 DIAGNOSIS — N2589 Other disorders resulting from impaired renal tubular function: Secondary | ICD-10-CM | POA: Diagnosis not present

## 2017-07-24 DIAGNOSIS — N2581 Secondary hyperparathyroidism of renal origin: Secondary | ICD-10-CM | POA: Diagnosis not present

## 2017-07-24 DIAGNOSIS — N186 End stage renal disease: Secondary | ICD-10-CM | POA: Diagnosis not present

## 2017-07-24 DIAGNOSIS — D631 Anemia in chronic kidney disease: Secondary | ICD-10-CM | POA: Diagnosis not present

## 2017-07-24 DIAGNOSIS — D509 Iron deficiency anemia, unspecified: Secondary | ICD-10-CM | POA: Diagnosis not present

## 2017-07-24 DIAGNOSIS — Z4932 Encounter for adequacy testing for peritoneal dialysis: Secondary | ICD-10-CM | POA: Diagnosis not present

## 2017-07-25 DIAGNOSIS — N2589 Other disorders resulting from impaired renal tubular function: Secondary | ICD-10-CM | POA: Diagnosis not present

## 2017-07-25 DIAGNOSIS — N2581 Secondary hyperparathyroidism of renal origin: Secondary | ICD-10-CM | POA: Diagnosis not present

## 2017-07-25 DIAGNOSIS — D631 Anemia in chronic kidney disease: Secondary | ICD-10-CM | POA: Diagnosis not present

## 2017-07-25 DIAGNOSIS — D509 Iron deficiency anemia, unspecified: Secondary | ICD-10-CM | POA: Diagnosis not present

## 2017-07-25 DIAGNOSIS — N186 End stage renal disease: Secondary | ICD-10-CM | POA: Diagnosis not present

## 2017-07-25 DIAGNOSIS — Z4932 Encounter for adequacy testing for peritoneal dialysis: Secondary | ICD-10-CM | POA: Diagnosis not present

## 2017-07-26 DIAGNOSIS — Z4932 Encounter for adequacy testing for peritoneal dialysis: Secondary | ICD-10-CM | POA: Diagnosis not present

## 2017-07-26 DIAGNOSIS — N2589 Other disorders resulting from impaired renal tubular function: Secondary | ICD-10-CM | POA: Diagnosis not present

## 2017-07-26 DIAGNOSIS — D631 Anemia in chronic kidney disease: Secondary | ICD-10-CM | POA: Diagnosis not present

## 2017-07-26 DIAGNOSIS — N2581 Secondary hyperparathyroidism of renal origin: Secondary | ICD-10-CM | POA: Diagnosis not present

## 2017-07-26 DIAGNOSIS — D509 Iron deficiency anemia, unspecified: Secondary | ICD-10-CM | POA: Diagnosis not present

## 2017-07-26 DIAGNOSIS — N186 End stage renal disease: Secondary | ICD-10-CM | POA: Diagnosis not present

## 2017-07-27 DIAGNOSIS — D509 Iron deficiency anemia, unspecified: Secondary | ICD-10-CM | POA: Diagnosis not present

## 2017-07-27 DIAGNOSIS — Z4932 Encounter for adequacy testing for peritoneal dialysis: Secondary | ICD-10-CM | POA: Diagnosis not present

## 2017-07-27 DIAGNOSIS — N2589 Other disorders resulting from impaired renal tubular function: Secondary | ICD-10-CM | POA: Diagnosis not present

## 2017-07-27 DIAGNOSIS — N2581 Secondary hyperparathyroidism of renal origin: Secondary | ICD-10-CM | POA: Diagnosis not present

## 2017-07-27 DIAGNOSIS — D631 Anemia in chronic kidney disease: Secondary | ICD-10-CM | POA: Diagnosis not present

## 2017-07-27 DIAGNOSIS — N186 End stage renal disease: Secondary | ICD-10-CM | POA: Diagnosis not present

## 2017-07-28 DIAGNOSIS — N2589 Other disorders resulting from impaired renal tubular function: Secondary | ICD-10-CM | POA: Diagnosis not present

## 2017-07-28 DIAGNOSIS — N186 End stage renal disease: Secondary | ICD-10-CM | POA: Diagnosis not present

## 2017-07-28 DIAGNOSIS — N2581 Secondary hyperparathyroidism of renal origin: Secondary | ICD-10-CM | POA: Diagnosis not present

## 2017-07-28 DIAGNOSIS — D631 Anemia in chronic kidney disease: Secondary | ICD-10-CM | POA: Diagnosis not present

## 2017-07-28 DIAGNOSIS — Z4932 Encounter for adequacy testing for peritoneal dialysis: Secondary | ICD-10-CM | POA: Diagnosis not present

## 2017-07-28 DIAGNOSIS — D509 Iron deficiency anemia, unspecified: Secondary | ICD-10-CM | POA: Diagnosis not present

## 2017-07-29 DIAGNOSIS — D631 Anemia in chronic kidney disease: Secondary | ICD-10-CM | POA: Diagnosis not present

## 2017-07-29 DIAGNOSIS — N2581 Secondary hyperparathyroidism of renal origin: Secondary | ICD-10-CM | POA: Diagnosis not present

## 2017-07-29 DIAGNOSIS — N186 End stage renal disease: Secondary | ICD-10-CM | POA: Diagnosis not present

## 2017-07-29 DIAGNOSIS — D509 Iron deficiency anemia, unspecified: Secondary | ICD-10-CM | POA: Diagnosis not present

## 2017-07-29 DIAGNOSIS — N2589 Other disorders resulting from impaired renal tubular function: Secondary | ICD-10-CM | POA: Diagnosis not present

## 2017-07-29 DIAGNOSIS — Z4932 Encounter for adequacy testing for peritoneal dialysis: Secondary | ICD-10-CM | POA: Diagnosis not present

## 2017-07-30 DIAGNOSIS — N2589 Other disorders resulting from impaired renal tubular function: Secondary | ICD-10-CM | POA: Diagnosis not present

## 2017-07-30 DIAGNOSIS — D631 Anemia in chronic kidney disease: Secondary | ICD-10-CM | POA: Diagnosis not present

## 2017-07-30 DIAGNOSIS — Z4932 Encounter for adequacy testing for peritoneal dialysis: Secondary | ICD-10-CM | POA: Diagnosis not present

## 2017-07-30 DIAGNOSIS — D509 Iron deficiency anemia, unspecified: Secondary | ICD-10-CM | POA: Diagnosis not present

## 2017-07-30 DIAGNOSIS — N2581 Secondary hyperparathyroidism of renal origin: Secondary | ICD-10-CM | POA: Diagnosis not present

## 2017-07-30 DIAGNOSIS — N186 End stage renal disease: Secondary | ICD-10-CM | POA: Diagnosis not present

## 2017-07-31 DIAGNOSIS — N186 End stage renal disease: Secondary | ICD-10-CM | POA: Diagnosis not present

## 2017-07-31 DIAGNOSIS — D631 Anemia in chronic kidney disease: Secondary | ICD-10-CM | POA: Diagnosis not present

## 2017-07-31 DIAGNOSIS — Z4932 Encounter for adequacy testing for peritoneal dialysis: Secondary | ICD-10-CM | POA: Diagnosis not present

## 2017-07-31 DIAGNOSIS — N2589 Other disorders resulting from impaired renal tubular function: Secondary | ICD-10-CM | POA: Diagnosis not present

## 2017-07-31 DIAGNOSIS — D509 Iron deficiency anemia, unspecified: Secondary | ICD-10-CM | POA: Diagnosis not present

## 2017-07-31 DIAGNOSIS — N2581 Secondary hyperparathyroidism of renal origin: Secondary | ICD-10-CM | POA: Diagnosis not present

## 2017-08-01 DIAGNOSIS — Z4932 Encounter for adequacy testing for peritoneal dialysis: Secondary | ICD-10-CM | POA: Diagnosis not present

## 2017-08-01 DIAGNOSIS — N2581 Secondary hyperparathyroidism of renal origin: Secondary | ICD-10-CM | POA: Diagnosis not present

## 2017-08-01 DIAGNOSIS — N2589 Other disorders resulting from impaired renal tubular function: Secondary | ICD-10-CM | POA: Diagnosis not present

## 2017-08-01 DIAGNOSIS — N186 End stage renal disease: Secondary | ICD-10-CM | POA: Diagnosis not present

## 2017-08-01 DIAGNOSIS — D509 Iron deficiency anemia, unspecified: Secondary | ICD-10-CM | POA: Diagnosis not present

## 2017-08-01 DIAGNOSIS — D631 Anemia in chronic kidney disease: Secondary | ICD-10-CM | POA: Diagnosis not present

## 2017-08-02 DIAGNOSIS — D509 Iron deficiency anemia, unspecified: Secondary | ICD-10-CM | POA: Diagnosis not present

## 2017-08-02 DIAGNOSIS — D631 Anemia in chronic kidney disease: Secondary | ICD-10-CM | POA: Diagnosis not present

## 2017-08-02 DIAGNOSIS — N186 End stage renal disease: Secondary | ICD-10-CM | POA: Diagnosis not present

## 2017-08-02 DIAGNOSIS — N2589 Other disorders resulting from impaired renal tubular function: Secondary | ICD-10-CM | POA: Diagnosis not present

## 2017-08-02 DIAGNOSIS — N2581 Secondary hyperparathyroidism of renal origin: Secondary | ICD-10-CM | POA: Diagnosis not present

## 2017-08-02 DIAGNOSIS — Z4932 Encounter for adequacy testing for peritoneal dialysis: Secondary | ICD-10-CM | POA: Diagnosis not present

## 2017-08-03 DIAGNOSIS — Z992 Dependence on renal dialysis: Secondary | ICD-10-CM | POA: Diagnosis not present

## 2017-08-03 DIAGNOSIS — D631 Anemia in chronic kidney disease: Secondary | ICD-10-CM | POA: Diagnosis not present

## 2017-08-03 DIAGNOSIS — N186 End stage renal disease: Secondary | ICD-10-CM | POA: Diagnosis not present

## 2017-08-03 DIAGNOSIS — D509 Iron deficiency anemia, unspecified: Secondary | ICD-10-CM | POA: Diagnosis not present

## 2017-08-03 DIAGNOSIS — N2581 Secondary hyperparathyroidism of renal origin: Secondary | ICD-10-CM | POA: Diagnosis not present

## 2017-08-03 DIAGNOSIS — Z4932 Encounter for adequacy testing for peritoneal dialysis: Secondary | ICD-10-CM | POA: Diagnosis not present

## 2017-08-03 DIAGNOSIS — N2589 Other disorders resulting from impaired renal tubular function: Secondary | ICD-10-CM | POA: Diagnosis not present

## 2017-08-03 DIAGNOSIS — M311 Thrombotic microangiopathy: Secondary | ICD-10-CM | POA: Diagnosis not present

## 2017-08-04 DIAGNOSIS — D631 Anemia in chronic kidney disease: Secondary | ICD-10-CM | POA: Diagnosis not present

## 2017-08-04 DIAGNOSIS — N2581 Secondary hyperparathyroidism of renal origin: Secondary | ICD-10-CM | POA: Diagnosis not present

## 2017-08-04 DIAGNOSIS — Z4932 Encounter for adequacy testing for peritoneal dialysis: Secondary | ICD-10-CM | POA: Diagnosis not present

## 2017-08-04 DIAGNOSIS — Z992 Dependence on renal dialysis: Secondary | ICD-10-CM | POA: Diagnosis not present

## 2017-08-04 DIAGNOSIS — R17 Unspecified jaundice: Secondary | ICD-10-CM | POA: Diagnosis not present

## 2017-08-04 DIAGNOSIS — E440 Moderate protein-calorie malnutrition: Secondary | ICD-10-CM | POA: Diagnosis not present

## 2017-08-04 DIAGNOSIS — N186 End stage renal disease: Secondary | ICD-10-CM | POA: Diagnosis not present

## 2017-08-04 DIAGNOSIS — D509 Iron deficiency anemia, unspecified: Secondary | ICD-10-CM | POA: Diagnosis not present

## 2017-08-04 DIAGNOSIS — Z79899 Other long term (current) drug therapy: Secondary | ICD-10-CM | POA: Diagnosis not present

## 2017-08-04 DIAGNOSIS — M311 Thrombotic microangiopathy: Secondary | ICD-10-CM | POA: Diagnosis not present

## 2017-08-05 DIAGNOSIS — D631 Anemia in chronic kidney disease: Secondary | ICD-10-CM | POA: Diagnosis not present

## 2017-08-05 DIAGNOSIS — D509 Iron deficiency anemia, unspecified: Secondary | ICD-10-CM | POA: Diagnosis not present

## 2017-08-05 DIAGNOSIS — N186 End stage renal disease: Secondary | ICD-10-CM | POA: Diagnosis not present

## 2017-08-05 DIAGNOSIS — N2581 Secondary hyperparathyroidism of renal origin: Secondary | ICD-10-CM | POA: Diagnosis not present

## 2017-08-05 DIAGNOSIS — Z4932 Encounter for adequacy testing for peritoneal dialysis: Secondary | ICD-10-CM | POA: Diagnosis not present

## 2017-08-05 DIAGNOSIS — Z79899 Other long term (current) drug therapy: Secondary | ICD-10-CM | POA: Diagnosis not present

## 2017-08-06 DIAGNOSIS — N2581 Secondary hyperparathyroidism of renal origin: Secondary | ICD-10-CM | POA: Diagnosis not present

## 2017-08-06 DIAGNOSIS — D631 Anemia in chronic kidney disease: Secondary | ICD-10-CM | POA: Diagnosis not present

## 2017-08-06 DIAGNOSIS — D509 Iron deficiency anemia, unspecified: Secondary | ICD-10-CM | POA: Diagnosis not present

## 2017-08-06 DIAGNOSIS — Z4932 Encounter for adequacy testing for peritoneal dialysis: Secondary | ICD-10-CM | POA: Diagnosis not present

## 2017-08-06 DIAGNOSIS — Z79899 Other long term (current) drug therapy: Secondary | ICD-10-CM | POA: Diagnosis not present

## 2017-08-06 DIAGNOSIS — N186 End stage renal disease: Secondary | ICD-10-CM | POA: Diagnosis not present

## 2017-08-07 DIAGNOSIS — D631 Anemia in chronic kidney disease: Secondary | ICD-10-CM | POA: Diagnosis not present

## 2017-08-07 DIAGNOSIS — D509 Iron deficiency anemia, unspecified: Secondary | ICD-10-CM | POA: Diagnosis not present

## 2017-08-07 DIAGNOSIS — Z4932 Encounter for adequacy testing for peritoneal dialysis: Secondary | ICD-10-CM | POA: Diagnosis not present

## 2017-08-07 DIAGNOSIS — Z79899 Other long term (current) drug therapy: Secondary | ICD-10-CM | POA: Diagnosis not present

## 2017-08-07 DIAGNOSIS — N2581 Secondary hyperparathyroidism of renal origin: Secondary | ICD-10-CM | POA: Diagnosis not present

## 2017-08-07 DIAGNOSIS — N186 End stage renal disease: Secondary | ICD-10-CM | POA: Diagnosis not present

## 2017-08-08 DIAGNOSIS — Z79899 Other long term (current) drug therapy: Secondary | ICD-10-CM | POA: Diagnosis not present

## 2017-08-08 DIAGNOSIS — D509 Iron deficiency anemia, unspecified: Secondary | ICD-10-CM | POA: Diagnosis not present

## 2017-08-08 DIAGNOSIS — D631 Anemia in chronic kidney disease: Secondary | ICD-10-CM | POA: Diagnosis not present

## 2017-08-08 DIAGNOSIS — Z4932 Encounter for adequacy testing for peritoneal dialysis: Secondary | ICD-10-CM | POA: Diagnosis not present

## 2017-08-08 DIAGNOSIS — N186 End stage renal disease: Secondary | ICD-10-CM | POA: Diagnosis not present

## 2017-08-08 DIAGNOSIS — N2581 Secondary hyperparathyroidism of renal origin: Secondary | ICD-10-CM | POA: Diagnosis not present

## 2017-08-09 DIAGNOSIS — N2581 Secondary hyperparathyroidism of renal origin: Secondary | ICD-10-CM | POA: Diagnosis not present

## 2017-08-09 DIAGNOSIS — Z79899 Other long term (current) drug therapy: Secondary | ICD-10-CM | POA: Diagnosis not present

## 2017-08-09 DIAGNOSIS — D509 Iron deficiency anemia, unspecified: Secondary | ICD-10-CM | POA: Diagnosis not present

## 2017-08-09 DIAGNOSIS — Z4932 Encounter for adequacy testing for peritoneal dialysis: Secondary | ICD-10-CM | POA: Diagnosis not present

## 2017-08-09 DIAGNOSIS — D631 Anemia in chronic kidney disease: Secondary | ICD-10-CM | POA: Diagnosis not present

## 2017-08-09 DIAGNOSIS — N186 End stage renal disease: Secondary | ICD-10-CM | POA: Diagnosis not present

## 2017-08-10 DIAGNOSIS — Z79899 Other long term (current) drug therapy: Secondary | ICD-10-CM | POA: Diagnosis not present

## 2017-08-10 DIAGNOSIS — D509 Iron deficiency anemia, unspecified: Secondary | ICD-10-CM | POA: Diagnosis not present

## 2017-08-10 DIAGNOSIS — N186 End stage renal disease: Secondary | ICD-10-CM | POA: Diagnosis not present

## 2017-08-10 DIAGNOSIS — D631 Anemia in chronic kidney disease: Secondary | ICD-10-CM | POA: Diagnosis not present

## 2017-08-10 DIAGNOSIS — N2581 Secondary hyperparathyroidism of renal origin: Secondary | ICD-10-CM | POA: Diagnosis not present

## 2017-08-10 DIAGNOSIS — Z4932 Encounter for adequacy testing for peritoneal dialysis: Secondary | ICD-10-CM | POA: Diagnosis not present

## 2017-08-11 DIAGNOSIS — N2581 Secondary hyperparathyroidism of renal origin: Secondary | ICD-10-CM | POA: Diagnosis not present

## 2017-08-11 DIAGNOSIS — D631 Anemia in chronic kidney disease: Secondary | ICD-10-CM | POA: Diagnosis not present

## 2017-08-11 DIAGNOSIS — N186 End stage renal disease: Secondary | ICD-10-CM | POA: Diagnosis not present

## 2017-08-11 DIAGNOSIS — D509 Iron deficiency anemia, unspecified: Secondary | ICD-10-CM | POA: Diagnosis not present

## 2017-08-11 DIAGNOSIS — Z79899 Other long term (current) drug therapy: Secondary | ICD-10-CM | POA: Diagnosis not present

## 2017-08-11 DIAGNOSIS — Z4932 Encounter for adequacy testing for peritoneal dialysis: Secondary | ICD-10-CM | POA: Diagnosis not present

## 2017-08-12 DIAGNOSIS — Z4932 Encounter for adequacy testing for peritoneal dialysis: Secondary | ICD-10-CM | POA: Diagnosis not present

## 2017-08-12 DIAGNOSIS — N2581 Secondary hyperparathyroidism of renal origin: Secondary | ICD-10-CM | POA: Diagnosis not present

## 2017-08-12 DIAGNOSIS — Z79899 Other long term (current) drug therapy: Secondary | ICD-10-CM | POA: Diagnosis not present

## 2017-08-12 DIAGNOSIS — D631 Anemia in chronic kidney disease: Secondary | ICD-10-CM | POA: Diagnosis not present

## 2017-08-12 DIAGNOSIS — D509 Iron deficiency anemia, unspecified: Secondary | ICD-10-CM | POA: Diagnosis not present

## 2017-08-12 DIAGNOSIS — N186 End stage renal disease: Secondary | ICD-10-CM | POA: Diagnosis not present

## 2017-08-13 DIAGNOSIS — N2581 Secondary hyperparathyroidism of renal origin: Secondary | ICD-10-CM | POA: Diagnosis not present

## 2017-08-13 DIAGNOSIS — Z79899 Other long term (current) drug therapy: Secondary | ICD-10-CM | POA: Diagnosis not present

## 2017-08-13 DIAGNOSIS — D509 Iron deficiency anemia, unspecified: Secondary | ICD-10-CM | POA: Diagnosis not present

## 2017-08-13 DIAGNOSIS — Z4932 Encounter for adequacy testing for peritoneal dialysis: Secondary | ICD-10-CM | POA: Diagnosis not present

## 2017-08-13 DIAGNOSIS — N186 End stage renal disease: Secondary | ICD-10-CM | POA: Diagnosis not present

## 2017-08-13 DIAGNOSIS — D631 Anemia in chronic kidney disease: Secondary | ICD-10-CM | POA: Diagnosis not present

## 2017-08-14 DIAGNOSIS — D509 Iron deficiency anemia, unspecified: Secondary | ICD-10-CM | POA: Diagnosis not present

## 2017-08-14 DIAGNOSIS — N2581 Secondary hyperparathyroidism of renal origin: Secondary | ICD-10-CM | POA: Diagnosis not present

## 2017-08-14 DIAGNOSIS — D631 Anemia in chronic kidney disease: Secondary | ICD-10-CM | POA: Diagnosis not present

## 2017-08-14 DIAGNOSIS — N186 End stage renal disease: Secondary | ICD-10-CM | POA: Diagnosis not present

## 2017-08-14 DIAGNOSIS — Z4932 Encounter for adequacy testing for peritoneal dialysis: Secondary | ICD-10-CM | POA: Diagnosis not present

## 2017-08-14 DIAGNOSIS — Z79899 Other long term (current) drug therapy: Secondary | ICD-10-CM | POA: Diagnosis not present

## 2017-08-15 DIAGNOSIS — N2581 Secondary hyperparathyroidism of renal origin: Secondary | ICD-10-CM | POA: Diagnosis not present

## 2017-08-15 DIAGNOSIS — Z79899 Other long term (current) drug therapy: Secondary | ICD-10-CM | POA: Diagnosis not present

## 2017-08-15 DIAGNOSIS — N186 End stage renal disease: Secondary | ICD-10-CM | POA: Diagnosis not present

## 2017-08-15 DIAGNOSIS — Z4932 Encounter for adequacy testing for peritoneal dialysis: Secondary | ICD-10-CM | POA: Diagnosis not present

## 2017-08-15 DIAGNOSIS — D631 Anemia in chronic kidney disease: Secondary | ICD-10-CM | POA: Diagnosis not present

## 2017-08-15 DIAGNOSIS — D509 Iron deficiency anemia, unspecified: Secondary | ICD-10-CM | POA: Diagnosis not present

## 2017-08-16 DIAGNOSIS — N186 End stage renal disease: Secondary | ICD-10-CM | POA: Diagnosis not present

## 2017-08-16 DIAGNOSIS — D509 Iron deficiency anemia, unspecified: Secondary | ICD-10-CM | POA: Diagnosis not present

## 2017-08-16 DIAGNOSIS — N2581 Secondary hyperparathyroidism of renal origin: Secondary | ICD-10-CM | POA: Diagnosis not present

## 2017-08-16 DIAGNOSIS — Z79899 Other long term (current) drug therapy: Secondary | ICD-10-CM | POA: Diagnosis not present

## 2017-08-16 DIAGNOSIS — Z4932 Encounter for adequacy testing for peritoneal dialysis: Secondary | ICD-10-CM | POA: Diagnosis not present

## 2017-08-16 DIAGNOSIS — D631 Anemia in chronic kidney disease: Secondary | ICD-10-CM | POA: Diagnosis not present

## 2017-08-17 DIAGNOSIS — N2581 Secondary hyperparathyroidism of renal origin: Secondary | ICD-10-CM | POA: Diagnosis not present

## 2017-08-17 DIAGNOSIS — D631 Anemia in chronic kidney disease: Secondary | ICD-10-CM | POA: Diagnosis not present

## 2017-08-17 DIAGNOSIS — Z4932 Encounter for adequacy testing for peritoneal dialysis: Secondary | ICD-10-CM | POA: Diagnosis not present

## 2017-08-17 DIAGNOSIS — D509 Iron deficiency anemia, unspecified: Secondary | ICD-10-CM | POA: Diagnosis not present

## 2017-08-17 DIAGNOSIS — Z79899 Other long term (current) drug therapy: Secondary | ICD-10-CM | POA: Diagnosis not present

## 2017-08-17 DIAGNOSIS — N186 End stage renal disease: Secondary | ICD-10-CM | POA: Diagnosis not present

## 2017-08-18 DIAGNOSIS — N2581 Secondary hyperparathyroidism of renal origin: Secondary | ICD-10-CM | POA: Diagnosis not present

## 2017-08-18 DIAGNOSIS — D631 Anemia in chronic kidney disease: Secondary | ICD-10-CM | POA: Diagnosis not present

## 2017-08-18 DIAGNOSIS — Z79899 Other long term (current) drug therapy: Secondary | ICD-10-CM | POA: Diagnosis not present

## 2017-08-18 DIAGNOSIS — D509 Iron deficiency anemia, unspecified: Secondary | ICD-10-CM | POA: Diagnosis not present

## 2017-08-18 DIAGNOSIS — N186 End stage renal disease: Secondary | ICD-10-CM | POA: Diagnosis not present

## 2017-08-18 DIAGNOSIS — Z4932 Encounter for adequacy testing for peritoneal dialysis: Secondary | ICD-10-CM | POA: Diagnosis not present

## 2017-08-19 DIAGNOSIS — N186 End stage renal disease: Secondary | ICD-10-CM | POA: Diagnosis not present

## 2017-08-19 DIAGNOSIS — Z79899 Other long term (current) drug therapy: Secondary | ICD-10-CM | POA: Diagnosis not present

## 2017-08-19 DIAGNOSIS — N2581 Secondary hyperparathyroidism of renal origin: Secondary | ICD-10-CM | POA: Diagnosis not present

## 2017-08-19 DIAGNOSIS — D509 Iron deficiency anemia, unspecified: Secondary | ICD-10-CM | POA: Diagnosis not present

## 2017-08-19 DIAGNOSIS — Z4932 Encounter for adequacy testing for peritoneal dialysis: Secondary | ICD-10-CM | POA: Diagnosis not present

## 2017-08-19 DIAGNOSIS — D631 Anemia in chronic kidney disease: Secondary | ICD-10-CM | POA: Diagnosis not present

## 2017-08-20 DIAGNOSIS — Z79899 Other long term (current) drug therapy: Secondary | ICD-10-CM | POA: Diagnosis not present

## 2017-08-20 DIAGNOSIS — N2581 Secondary hyperparathyroidism of renal origin: Secondary | ICD-10-CM | POA: Diagnosis not present

## 2017-08-20 DIAGNOSIS — Z4932 Encounter for adequacy testing for peritoneal dialysis: Secondary | ICD-10-CM | POA: Diagnosis not present

## 2017-08-20 DIAGNOSIS — D509 Iron deficiency anemia, unspecified: Secondary | ICD-10-CM | POA: Diagnosis not present

## 2017-08-20 DIAGNOSIS — N186 End stage renal disease: Secondary | ICD-10-CM | POA: Diagnosis not present

## 2017-08-20 DIAGNOSIS — D631 Anemia in chronic kidney disease: Secondary | ICD-10-CM | POA: Diagnosis not present

## 2017-08-21 DIAGNOSIS — N2581 Secondary hyperparathyroidism of renal origin: Secondary | ICD-10-CM | POA: Diagnosis not present

## 2017-08-21 DIAGNOSIS — D631 Anemia in chronic kidney disease: Secondary | ICD-10-CM | POA: Diagnosis not present

## 2017-08-21 DIAGNOSIS — N186 End stage renal disease: Secondary | ICD-10-CM | POA: Diagnosis not present

## 2017-08-21 DIAGNOSIS — Z4932 Encounter for adequacy testing for peritoneal dialysis: Secondary | ICD-10-CM | POA: Diagnosis not present

## 2017-08-21 DIAGNOSIS — Z79899 Other long term (current) drug therapy: Secondary | ICD-10-CM | POA: Diagnosis not present

## 2017-08-21 DIAGNOSIS — D509 Iron deficiency anemia, unspecified: Secondary | ICD-10-CM | POA: Diagnosis not present

## 2017-08-22 DIAGNOSIS — Z79899 Other long term (current) drug therapy: Secondary | ICD-10-CM | POA: Diagnosis not present

## 2017-08-22 DIAGNOSIS — D631 Anemia in chronic kidney disease: Secondary | ICD-10-CM | POA: Diagnosis not present

## 2017-08-22 DIAGNOSIS — N2581 Secondary hyperparathyroidism of renal origin: Secondary | ICD-10-CM | POA: Diagnosis not present

## 2017-08-22 DIAGNOSIS — D509 Iron deficiency anemia, unspecified: Secondary | ICD-10-CM | POA: Diagnosis not present

## 2017-08-22 DIAGNOSIS — Z4932 Encounter for adequacy testing for peritoneal dialysis: Secondary | ICD-10-CM | POA: Diagnosis not present

## 2017-08-22 DIAGNOSIS — N186 End stage renal disease: Secondary | ICD-10-CM | POA: Diagnosis not present

## 2017-08-23 DIAGNOSIS — D509 Iron deficiency anemia, unspecified: Secondary | ICD-10-CM | POA: Diagnosis not present

## 2017-08-23 DIAGNOSIS — N186 End stage renal disease: Secondary | ICD-10-CM | POA: Diagnosis not present

## 2017-08-23 DIAGNOSIS — D631 Anemia in chronic kidney disease: Secondary | ICD-10-CM | POA: Diagnosis not present

## 2017-08-23 DIAGNOSIS — N2581 Secondary hyperparathyroidism of renal origin: Secondary | ICD-10-CM | POA: Diagnosis not present

## 2017-08-23 DIAGNOSIS — Z4932 Encounter for adequacy testing for peritoneal dialysis: Secondary | ICD-10-CM | POA: Diagnosis not present

## 2017-08-23 DIAGNOSIS — Z79899 Other long term (current) drug therapy: Secondary | ICD-10-CM | POA: Diagnosis not present

## 2017-08-24 DIAGNOSIS — N2581 Secondary hyperparathyroidism of renal origin: Secondary | ICD-10-CM | POA: Diagnosis not present

## 2017-08-24 DIAGNOSIS — Z79899 Other long term (current) drug therapy: Secondary | ICD-10-CM | POA: Diagnosis not present

## 2017-08-24 DIAGNOSIS — Z4932 Encounter for adequacy testing for peritoneal dialysis: Secondary | ICD-10-CM | POA: Diagnosis not present

## 2017-08-24 DIAGNOSIS — N186 End stage renal disease: Secondary | ICD-10-CM | POA: Diagnosis not present

## 2017-08-24 DIAGNOSIS — D509 Iron deficiency anemia, unspecified: Secondary | ICD-10-CM | POA: Diagnosis not present

## 2017-08-24 DIAGNOSIS — D631 Anemia in chronic kidney disease: Secondary | ICD-10-CM | POA: Diagnosis not present

## 2017-08-25 DIAGNOSIS — N2581 Secondary hyperparathyroidism of renal origin: Secondary | ICD-10-CM | POA: Diagnosis not present

## 2017-08-25 DIAGNOSIS — Z4932 Encounter for adequacy testing for peritoneal dialysis: Secondary | ICD-10-CM | POA: Diagnosis not present

## 2017-08-25 DIAGNOSIS — N186 End stage renal disease: Secondary | ICD-10-CM | POA: Diagnosis not present

## 2017-08-25 DIAGNOSIS — D509 Iron deficiency anemia, unspecified: Secondary | ICD-10-CM | POA: Diagnosis not present

## 2017-08-25 DIAGNOSIS — D631 Anemia in chronic kidney disease: Secondary | ICD-10-CM | POA: Diagnosis not present

## 2017-08-25 DIAGNOSIS — Z79899 Other long term (current) drug therapy: Secondary | ICD-10-CM | POA: Diagnosis not present

## 2017-08-26 DIAGNOSIS — D631 Anemia in chronic kidney disease: Secondary | ICD-10-CM | POA: Diagnosis not present

## 2017-08-26 DIAGNOSIS — Z79899 Other long term (current) drug therapy: Secondary | ICD-10-CM | POA: Diagnosis not present

## 2017-08-26 DIAGNOSIS — N186 End stage renal disease: Secondary | ICD-10-CM | POA: Diagnosis not present

## 2017-08-26 DIAGNOSIS — Z4932 Encounter for adequacy testing for peritoneal dialysis: Secondary | ICD-10-CM | POA: Diagnosis not present

## 2017-08-26 DIAGNOSIS — N2581 Secondary hyperparathyroidism of renal origin: Secondary | ICD-10-CM | POA: Diagnosis not present

## 2017-08-26 DIAGNOSIS — D509 Iron deficiency anemia, unspecified: Secondary | ICD-10-CM | POA: Diagnosis not present

## 2017-08-27 DIAGNOSIS — N2581 Secondary hyperparathyroidism of renal origin: Secondary | ICD-10-CM | POA: Diagnosis not present

## 2017-08-27 DIAGNOSIS — Z4932 Encounter for adequacy testing for peritoneal dialysis: Secondary | ICD-10-CM | POA: Diagnosis not present

## 2017-08-27 DIAGNOSIS — D631 Anemia in chronic kidney disease: Secondary | ICD-10-CM | POA: Diagnosis not present

## 2017-08-27 DIAGNOSIS — N186 End stage renal disease: Secondary | ICD-10-CM | POA: Diagnosis not present

## 2017-08-27 DIAGNOSIS — D509 Iron deficiency anemia, unspecified: Secondary | ICD-10-CM | POA: Diagnosis not present

## 2017-08-27 DIAGNOSIS — Z79899 Other long term (current) drug therapy: Secondary | ICD-10-CM | POA: Diagnosis not present

## 2017-08-28 DIAGNOSIS — D631 Anemia in chronic kidney disease: Secondary | ICD-10-CM | POA: Diagnosis not present

## 2017-08-28 DIAGNOSIS — Z79899 Other long term (current) drug therapy: Secondary | ICD-10-CM | POA: Diagnosis not present

## 2017-08-28 DIAGNOSIS — N186 End stage renal disease: Secondary | ICD-10-CM | POA: Diagnosis not present

## 2017-08-28 DIAGNOSIS — D509 Iron deficiency anemia, unspecified: Secondary | ICD-10-CM | POA: Diagnosis not present

## 2017-08-28 DIAGNOSIS — N2581 Secondary hyperparathyroidism of renal origin: Secondary | ICD-10-CM | POA: Diagnosis not present

## 2017-08-28 DIAGNOSIS — Z4932 Encounter for adequacy testing for peritoneal dialysis: Secondary | ICD-10-CM | POA: Diagnosis not present

## 2017-08-29 DIAGNOSIS — Z79899 Other long term (current) drug therapy: Secondary | ICD-10-CM | POA: Diagnosis not present

## 2017-08-29 DIAGNOSIS — Z4932 Encounter for adequacy testing for peritoneal dialysis: Secondary | ICD-10-CM | POA: Diagnosis not present

## 2017-08-29 DIAGNOSIS — D631 Anemia in chronic kidney disease: Secondary | ICD-10-CM | POA: Diagnosis not present

## 2017-08-29 DIAGNOSIS — N2581 Secondary hyperparathyroidism of renal origin: Secondary | ICD-10-CM | POA: Diagnosis not present

## 2017-08-29 DIAGNOSIS — D509 Iron deficiency anemia, unspecified: Secondary | ICD-10-CM | POA: Diagnosis not present

## 2017-08-29 DIAGNOSIS — N186 End stage renal disease: Secondary | ICD-10-CM | POA: Diagnosis not present

## 2017-08-30 DIAGNOSIS — Z4932 Encounter for adequacy testing for peritoneal dialysis: Secondary | ICD-10-CM | POA: Diagnosis not present

## 2017-08-30 DIAGNOSIS — D631 Anemia in chronic kidney disease: Secondary | ICD-10-CM | POA: Diagnosis not present

## 2017-08-30 DIAGNOSIS — N2581 Secondary hyperparathyroidism of renal origin: Secondary | ICD-10-CM | POA: Diagnosis not present

## 2017-08-30 DIAGNOSIS — N186 End stage renal disease: Secondary | ICD-10-CM | POA: Diagnosis not present

## 2017-08-30 DIAGNOSIS — Z79899 Other long term (current) drug therapy: Secondary | ICD-10-CM | POA: Diagnosis not present

## 2017-08-30 DIAGNOSIS — D509 Iron deficiency anemia, unspecified: Secondary | ICD-10-CM | POA: Diagnosis not present

## 2017-08-31 DIAGNOSIS — Z4932 Encounter for adequacy testing for peritoneal dialysis: Secondary | ICD-10-CM | POA: Diagnosis not present

## 2017-08-31 DIAGNOSIS — D631 Anemia in chronic kidney disease: Secondary | ICD-10-CM | POA: Diagnosis not present

## 2017-08-31 DIAGNOSIS — D509 Iron deficiency anemia, unspecified: Secondary | ICD-10-CM | POA: Diagnosis not present

## 2017-08-31 DIAGNOSIS — Z79899 Other long term (current) drug therapy: Secondary | ICD-10-CM | POA: Diagnosis not present

## 2017-08-31 DIAGNOSIS — N2581 Secondary hyperparathyroidism of renal origin: Secondary | ICD-10-CM | POA: Diagnosis not present

## 2017-08-31 DIAGNOSIS — N186 End stage renal disease: Secondary | ICD-10-CM | POA: Diagnosis not present

## 2017-09-01 DIAGNOSIS — N2581 Secondary hyperparathyroidism of renal origin: Secondary | ICD-10-CM | POA: Diagnosis not present

## 2017-09-01 DIAGNOSIS — Z79899 Other long term (current) drug therapy: Secondary | ICD-10-CM | POA: Diagnosis not present

## 2017-09-01 DIAGNOSIS — D509 Iron deficiency anemia, unspecified: Secondary | ICD-10-CM | POA: Diagnosis not present

## 2017-09-01 DIAGNOSIS — D631 Anemia in chronic kidney disease: Secondary | ICD-10-CM | POA: Diagnosis not present

## 2017-09-01 DIAGNOSIS — N186 End stage renal disease: Secondary | ICD-10-CM | POA: Diagnosis not present

## 2017-09-01 DIAGNOSIS — Z4932 Encounter for adequacy testing for peritoneal dialysis: Secondary | ICD-10-CM | POA: Diagnosis not present

## 2017-09-02 DIAGNOSIS — D509 Iron deficiency anemia, unspecified: Secondary | ICD-10-CM | POA: Diagnosis not present

## 2017-09-02 DIAGNOSIS — Z79899 Other long term (current) drug therapy: Secondary | ICD-10-CM | POA: Diagnosis not present

## 2017-09-02 DIAGNOSIS — Z4932 Encounter for adequacy testing for peritoneal dialysis: Secondary | ICD-10-CM | POA: Diagnosis not present

## 2017-09-02 DIAGNOSIS — D631 Anemia in chronic kidney disease: Secondary | ICD-10-CM | POA: Diagnosis not present

## 2017-09-02 DIAGNOSIS — N186 End stage renal disease: Secondary | ICD-10-CM | POA: Diagnosis not present

## 2017-09-02 DIAGNOSIS — N2581 Secondary hyperparathyroidism of renal origin: Secondary | ICD-10-CM | POA: Diagnosis not present

## 2017-09-03 DIAGNOSIS — Z79899 Other long term (current) drug therapy: Secondary | ICD-10-CM | POA: Diagnosis not present

## 2017-09-03 DIAGNOSIS — N2581 Secondary hyperparathyroidism of renal origin: Secondary | ICD-10-CM | POA: Diagnosis not present

## 2017-09-03 DIAGNOSIS — D631 Anemia in chronic kidney disease: Secondary | ICD-10-CM | POA: Diagnosis not present

## 2017-09-03 DIAGNOSIS — N186 End stage renal disease: Secondary | ICD-10-CM | POA: Diagnosis not present

## 2017-09-03 DIAGNOSIS — D509 Iron deficiency anemia, unspecified: Secondary | ICD-10-CM | POA: Diagnosis not present

## 2017-09-03 DIAGNOSIS — Z4932 Encounter for adequacy testing for peritoneal dialysis: Secondary | ICD-10-CM | POA: Diagnosis not present

## 2017-09-04 DIAGNOSIS — Z79899 Other long term (current) drug therapy: Secondary | ICD-10-CM | POA: Diagnosis not present

## 2017-09-04 DIAGNOSIS — M311 Thrombotic microangiopathy: Secondary | ICD-10-CM | POA: Diagnosis not present

## 2017-09-04 DIAGNOSIS — Z992 Dependence on renal dialysis: Secondary | ICD-10-CM | POA: Diagnosis not present

## 2017-09-04 DIAGNOSIS — D509 Iron deficiency anemia, unspecified: Secondary | ICD-10-CM | POA: Diagnosis not present

## 2017-09-04 DIAGNOSIS — R17 Unspecified jaundice: Secondary | ICD-10-CM | POA: Diagnosis not present

## 2017-09-04 DIAGNOSIS — Z23 Encounter for immunization: Secondary | ICD-10-CM | POA: Diagnosis not present

## 2017-09-04 DIAGNOSIS — E440 Moderate protein-calorie malnutrition: Secondary | ICD-10-CM | POA: Diagnosis not present

## 2017-09-04 DIAGNOSIS — Z4932 Encounter for adequacy testing for peritoneal dialysis: Secondary | ICD-10-CM | POA: Diagnosis not present

## 2017-09-04 DIAGNOSIS — N2581 Secondary hyperparathyroidism of renal origin: Secondary | ICD-10-CM | POA: Diagnosis not present

## 2017-09-04 DIAGNOSIS — N186 End stage renal disease: Secondary | ICD-10-CM | POA: Diagnosis not present

## 2017-09-04 DIAGNOSIS — D631 Anemia in chronic kidney disease: Secondary | ICD-10-CM | POA: Diagnosis not present

## 2017-09-05 DIAGNOSIS — N186 End stage renal disease: Secondary | ICD-10-CM | POA: Diagnosis not present

## 2017-09-05 DIAGNOSIS — R17 Unspecified jaundice: Secondary | ICD-10-CM | POA: Diagnosis not present

## 2017-09-05 DIAGNOSIS — E440 Moderate protein-calorie malnutrition: Secondary | ICD-10-CM | POA: Diagnosis not present

## 2017-09-05 DIAGNOSIS — D631 Anemia in chronic kidney disease: Secondary | ICD-10-CM | POA: Diagnosis not present

## 2017-09-05 DIAGNOSIS — D509 Iron deficiency anemia, unspecified: Secondary | ICD-10-CM | POA: Diagnosis not present

## 2017-09-05 DIAGNOSIS — N2581 Secondary hyperparathyroidism of renal origin: Secondary | ICD-10-CM | POA: Diagnosis not present

## 2017-09-06 DIAGNOSIS — R17 Unspecified jaundice: Secondary | ICD-10-CM | POA: Diagnosis not present

## 2017-09-06 DIAGNOSIS — D509 Iron deficiency anemia, unspecified: Secondary | ICD-10-CM | POA: Diagnosis not present

## 2017-09-06 DIAGNOSIS — D631 Anemia in chronic kidney disease: Secondary | ICD-10-CM | POA: Diagnosis not present

## 2017-09-06 DIAGNOSIS — E440 Moderate protein-calorie malnutrition: Secondary | ICD-10-CM | POA: Diagnosis not present

## 2017-09-06 DIAGNOSIS — N186 End stage renal disease: Secondary | ICD-10-CM | POA: Diagnosis not present

## 2017-09-06 DIAGNOSIS — N2581 Secondary hyperparathyroidism of renal origin: Secondary | ICD-10-CM | POA: Diagnosis not present

## 2017-09-07 DIAGNOSIS — D631 Anemia in chronic kidney disease: Secondary | ICD-10-CM | POA: Diagnosis not present

## 2017-09-07 DIAGNOSIS — E440 Moderate protein-calorie malnutrition: Secondary | ICD-10-CM | POA: Diagnosis not present

## 2017-09-07 DIAGNOSIS — N2581 Secondary hyperparathyroidism of renal origin: Secondary | ICD-10-CM | POA: Diagnosis not present

## 2017-09-07 DIAGNOSIS — D509 Iron deficiency anemia, unspecified: Secondary | ICD-10-CM | POA: Diagnosis not present

## 2017-09-07 DIAGNOSIS — N186 End stage renal disease: Secondary | ICD-10-CM | POA: Diagnosis not present

## 2017-09-07 DIAGNOSIS — R17 Unspecified jaundice: Secondary | ICD-10-CM | POA: Diagnosis not present

## 2017-09-08 DIAGNOSIS — D509 Iron deficiency anemia, unspecified: Secondary | ICD-10-CM | POA: Diagnosis not present

## 2017-09-08 DIAGNOSIS — D631 Anemia in chronic kidney disease: Secondary | ICD-10-CM | POA: Diagnosis not present

## 2017-09-08 DIAGNOSIS — R17 Unspecified jaundice: Secondary | ICD-10-CM | POA: Diagnosis not present

## 2017-09-08 DIAGNOSIS — N2581 Secondary hyperparathyroidism of renal origin: Secondary | ICD-10-CM | POA: Diagnosis not present

## 2017-09-08 DIAGNOSIS — E440 Moderate protein-calorie malnutrition: Secondary | ICD-10-CM | POA: Diagnosis not present

## 2017-09-08 DIAGNOSIS — N186 End stage renal disease: Secondary | ICD-10-CM | POA: Diagnosis not present

## 2017-09-09 DIAGNOSIS — N186 End stage renal disease: Secondary | ICD-10-CM | POA: Diagnosis not present

## 2017-09-09 DIAGNOSIS — N2581 Secondary hyperparathyroidism of renal origin: Secondary | ICD-10-CM | POA: Diagnosis not present

## 2017-09-09 DIAGNOSIS — D631 Anemia in chronic kidney disease: Secondary | ICD-10-CM | POA: Diagnosis not present

## 2017-09-09 DIAGNOSIS — E440 Moderate protein-calorie malnutrition: Secondary | ICD-10-CM | POA: Diagnosis not present

## 2017-09-09 DIAGNOSIS — R17 Unspecified jaundice: Secondary | ICD-10-CM | POA: Diagnosis not present

## 2017-09-09 DIAGNOSIS — D509 Iron deficiency anemia, unspecified: Secondary | ICD-10-CM | POA: Diagnosis not present

## 2017-09-10 DIAGNOSIS — E440 Moderate protein-calorie malnutrition: Secondary | ICD-10-CM | POA: Diagnosis not present

## 2017-09-10 DIAGNOSIS — N186 End stage renal disease: Secondary | ICD-10-CM | POA: Diagnosis not present

## 2017-09-10 DIAGNOSIS — D631 Anemia in chronic kidney disease: Secondary | ICD-10-CM | POA: Diagnosis not present

## 2017-09-10 DIAGNOSIS — D509 Iron deficiency anemia, unspecified: Secondary | ICD-10-CM | POA: Diagnosis not present

## 2017-09-10 DIAGNOSIS — R17 Unspecified jaundice: Secondary | ICD-10-CM | POA: Diagnosis not present

## 2017-09-10 DIAGNOSIS — N2581 Secondary hyperparathyroidism of renal origin: Secondary | ICD-10-CM | POA: Diagnosis not present

## 2017-09-11 DIAGNOSIS — D509 Iron deficiency anemia, unspecified: Secondary | ICD-10-CM | POA: Diagnosis not present

## 2017-09-11 DIAGNOSIS — D631 Anemia in chronic kidney disease: Secondary | ICD-10-CM | POA: Diagnosis not present

## 2017-09-11 DIAGNOSIS — N2581 Secondary hyperparathyroidism of renal origin: Secondary | ICD-10-CM | POA: Diagnosis not present

## 2017-09-11 DIAGNOSIS — R17 Unspecified jaundice: Secondary | ICD-10-CM | POA: Diagnosis not present

## 2017-09-11 DIAGNOSIS — E440 Moderate protein-calorie malnutrition: Secondary | ICD-10-CM | POA: Diagnosis not present

## 2017-09-11 DIAGNOSIS — N186 End stage renal disease: Secondary | ICD-10-CM | POA: Diagnosis not present

## 2017-09-12 DIAGNOSIS — D631 Anemia in chronic kidney disease: Secondary | ICD-10-CM | POA: Diagnosis not present

## 2017-09-12 DIAGNOSIS — E440 Moderate protein-calorie malnutrition: Secondary | ICD-10-CM | POA: Diagnosis not present

## 2017-09-12 DIAGNOSIS — N2581 Secondary hyperparathyroidism of renal origin: Secondary | ICD-10-CM | POA: Diagnosis not present

## 2017-09-12 DIAGNOSIS — N186 End stage renal disease: Secondary | ICD-10-CM | POA: Diagnosis not present

## 2017-09-12 DIAGNOSIS — R17 Unspecified jaundice: Secondary | ICD-10-CM | POA: Diagnosis not present

## 2017-09-12 DIAGNOSIS — D509 Iron deficiency anemia, unspecified: Secondary | ICD-10-CM | POA: Diagnosis not present

## 2017-09-13 DIAGNOSIS — D631 Anemia in chronic kidney disease: Secondary | ICD-10-CM | POA: Diagnosis not present

## 2017-09-13 DIAGNOSIS — N2581 Secondary hyperparathyroidism of renal origin: Secondary | ICD-10-CM | POA: Diagnosis not present

## 2017-09-13 DIAGNOSIS — E440 Moderate protein-calorie malnutrition: Secondary | ICD-10-CM | POA: Diagnosis not present

## 2017-09-13 DIAGNOSIS — R17 Unspecified jaundice: Secondary | ICD-10-CM | POA: Diagnosis not present

## 2017-09-13 DIAGNOSIS — D509 Iron deficiency anemia, unspecified: Secondary | ICD-10-CM | POA: Diagnosis not present

## 2017-09-13 DIAGNOSIS — N186 End stage renal disease: Secondary | ICD-10-CM | POA: Diagnosis not present

## 2017-09-14 DIAGNOSIS — N2581 Secondary hyperparathyroidism of renal origin: Secondary | ICD-10-CM | POA: Diagnosis not present

## 2017-09-14 DIAGNOSIS — N186 End stage renal disease: Secondary | ICD-10-CM | POA: Diagnosis not present

## 2017-09-14 DIAGNOSIS — E440 Moderate protein-calorie malnutrition: Secondary | ICD-10-CM | POA: Diagnosis not present

## 2017-09-14 DIAGNOSIS — R17 Unspecified jaundice: Secondary | ICD-10-CM | POA: Diagnosis not present

## 2017-09-14 DIAGNOSIS — D509 Iron deficiency anemia, unspecified: Secondary | ICD-10-CM | POA: Diagnosis not present

## 2017-09-14 DIAGNOSIS — D631 Anemia in chronic kidney disease: Secondary | ICD-10-CM | POA: Diagnosis not present

## 2017-09-15 DIAGNOSIS — N2581 Secondary hyperparathyroidism of renal origin: Secondary | ICD-10-CM | POA: Diagnosis not present

## 2017-09-15 DIAGNOSIS — R17 Unspecified jaundice: Secondary | ICD-10-CM | POA: Diagnosis not present

## 2017-09-15 DIAGNOSIS — E440 Moderate protein-calorie malnutrition: Secondary | ICD-10-CM | POA: Diagnosis not present

## 2017-09-15 DIAGNOSIS — N186 End stage renal disease: Secondary | ICD-10-CM | POA: Diagnosis not present

## 2017-09-15 DIAGNOSIS — D509 Iron deficiency anemia, unspecified: Secondary | ICD-10-CM | POA: Diagnosis not present

## 2017-09-15 DIAGNOSIS — D631 Anemia in chronic kidney disease: Secondary | ICD-10-CM | POA: Diagnosis not present

## 2017-09-16 DIAGNOSIS — D631 Anemia in chronic kidney disease: Secondary | ICD-10-CM | POA: Diagnosis not present

## 2017-09-16 DIAGNOSIS — N186 End stage renal disease: Secondary | ICD-10-CM | POA: Diagnosis not present

## 2017-09-16 DIAGNOSIS — D509 Iron deficiency anemia, unspecified: Secondary | ICD-10-CM | POA: Diagnosis not present

## 2017-09-16 DIAGNOSIS — R17 Unspecified jaundice: Secondary | ICD-10-CM | POA: Diagnosis not present

## 2017-09-16 DIAGNOSIS — E440 Moderate protein-calorie malnutrition: Secondary | ICD-10-CM | POA: Diagnosis not present

## 2017-09-16 DIAGNOSIS — N2581 Secondary hyperparathyroidism of renal origin: Secondary | ICD-10-CM | POA: Diagnosis not present

## 2017-09-17 DIAGNOSIS — E440 Moderate protein-calorie malnutrition: Secondary | ICD-10-CM | POA: Diagnosis not present

## 2017-09-17 DIAGNOSIS — N186 End stage renal disease: Secondary | ICD-10-CM | POA: Diagnosis not present

## 2017-09-17 DIAGNOSIS — D631 Anemia in chronic kidney disease: Secondary | ICD-10-CM | POA: Diagnosis not present

## 2017-09-17 DIAGNOSIS — R17 Unspecified jaundice: Secondary | ICD-10-CM | POA: Diagnosis not present

## 2017-09-17 DIAGNOSIS — N2581 Secondary hyperparathyroidism of renal origin: Secondary | ICD-10-CM | POA: Diagnosis not present

## 2017-09-17 DIAGNOSIS — D509 Iron deficiency anemia, unspecified: Secondary | ICD-10-CM | POA: Diagnosis not present

## 2017-09-18 DIAGNOSIS — E440 Moderate protein-calorie malnutrition: Secondary | ICD-10-CM | POA: Diagnosis not present

## 2017-09-18 DIAGNOSIS — D631 Anemia in chronic kidney disease: Secondary | ICD-10-CM | POA: Diagnosis not present

## 2017-09-18 DIAGNOSIS — N2581 Secondary hyperparathyroidism of renal origin: Secondary | ICD-10-CM | POA: Diagnosis not present

## 2017-09-18 DIAGNOSIS — N186 End stage renal disease: Secondary | ICD-10-CM | POA: Diagnosis not present

## 2017-09-18 DIAGNOSIS — D509 Iron deficiency anemia, unspecified: Secondary | ICD-10-CM | POA: Diagnosis not present

## 2017-09-18 DIAGNOSIS — R17 Unspecified jaundice: Secondary | ICD-10-CM | POA: Diagnosis not present

## 2017-09-19 ENCOUNTER — Other Ambulatory Visit: Payer: Self-pay

## 2017-09-19 DIAGNOSIS — E440 Moderate protein-calorie malnutrition: Secondary | ICD-10-CM | POA: Diagnosis not present

## 2017-09-19 DIAGNOSIS — R17 Unspecified jaundice: Secondary | ICD-10-CM | POA: Diagnosis not present

## 2017-09-19 DIAGNOSIS — N2581 Secondary hyperparathyroidism of renal origin: Secondary | ICD-10-CM | POA: Diagnosis not present

## 2017-09-19 DIAGNOSIS — T82898A Other specified complication of vascular prosthetic devices, implants and grafts, initial encounter: Secondary | ICD-10-CM

## 2017-09-19 DIAGNOSIS — N186 End stage renal disease: Secondary | ICD-10-CM | POA: Diagnosis not present

## 2017-09-19 DIAGNOSIS — D631 Anemia in chronic kidney disease: Secondary | ICD-10-CM | POA: Diagnosis not present

## 2017-09-19 DIAGNOSIS — D509 Iron deficiency anemia, unspecified: Secondary | ICD-10-CM | POA: Diagnosis not present

## 2017-09-20 DIAGNOSIS — E440 Moderate protein-calorie malnutrition: Secondary | ICD-10-CM | POA: Diagnosis not present

## 2017-09-20 DIAGNOSIS — N186 End stage renal disease: Secondary | ICD-10-CM | POA: Diagnosis not present

## 2017-09-20 DIAGNOSIS — N2581 Secondary hyperparathyroidism of renal origin: Secondary | ICD-10-CM | POA: Diagnosis not present

## 2017-09-20 DIAGNOSIS — R17 Unspecified jaundice: Secondary | ICD-10-CM | POA: Diagnosis not present

## 2017-09-20 DIAGNOSIS — D631 Anemia in chronic kidney disease: Secondary | ICD-10-CM | POA: Diagnosis not present

## 2017-09-20 DIAGNOSIS — D509 Iron deficiency anemia, unspecified: Secondary | ICD-10-CM | POA: Diagnosis not present

## 2017-09-21 DIAGNOSIS — N2581 Secondary hyperparathyroidism of renal origin: Secondary | ICD-10-CM | POA: Diagnosis not present

## 2017-09-21 DIAGNOSIS — D509 Iron deficiency anemia, unspecified: Secondary | ICD-10-CM | POA: Diagnosis not present

## 2017-09-21 DIAGNOSIS — R17 Unspecified jaundice: Secondary | ICD-10-CM | POA: Diagnosis not present

## 2017-09-21 DIAGNOSIS — N186 End stage renal disease: Secondary | ICD-10-CM | POA: Diagnosis not present

## 2017-09-21 DIAGNOSIS — D631 Anemia in chronic kidney disease: Secondary | ICD-10-CM | POA: Diagnosis not present

## 2017-09-21 DIAGNOSIS — E440 Moderate protein-calorie malnutrition: Secondary | ICD-10-CM | POA: Diagnosis not present

## 2017-09-22 DIAGNOSIS — R17 Unspecified jaundice: Secondary | ICD-10-CM | POA: Diagnosis not present

## 2017-09-22 DIAGNOSIS — D631 Anemia in chronic kidney disease: Secondary | ICD-10-CM | POA: Diagnosis not present

## 2017-09-22 DIAGNOSIS — N2581 Secondary hyperparathyroidism of renal origin: Secondary | ICD-10-CM | POA: Diagnosis not present

## 2017-09-22 DIAGNOSIS — E440 Moderate protein-calorie malnutrition: Secondary | ICD-10-CM | POA: Diagnosis not present

## 2017-09-22 DIAGNOSIS — N186 End stage renal disease: Secondary | ICD-10-CM | POA: Diagnosis not present

## 2017-09-22 DIAGNOSIS — D509 Iron deficiency anemia, unspecified: Secondary | ICD-10-CM | POA: Diagnosis not present

## 2017-09-23 DIAGNOSIS — R17 Unspecified jaundice: Secondary | ICD-10-CM | POA: Diagnosis not present

## 2017-09-23 DIAGNOSIS — N186 End stage renal disease: Secondary | ICD-10-CM | POA: Diagnosis not present

## 2017-09-23 DIAGNOSIS — N2581 Secondary hyperparathyroidism of renal origin: Secondary | ICD-10-CM | POA: Diagnosis not present

## 2017-09-23 DIAGNOSIS — E440 Moderate protein-calorie malnutrition: Secondary | ICD-10-CM | POA: Diagnosis not present

## 2017-09-23 DIAGNOSIS — D631 Anemia in chronic kidney disease: Secondary | ICD-10-CM | POA: Diagnosis not present

## 2017-09-23 DIAGNOSIS — D509 Iron deficiency anemia, unspecified: Secondary | ICD-10-CM | POA: Diagnosis not present

## 2017-09-24 DIAGNOSIS — R17 Unspecified jaundice: Secondary | ICD-10-CM | POA: Diagnosis not present

## 2017-09-24 DIAGNOSIS — D631 Anemia in chronic kidney disease: Secondary | ICD-10-CM | POA: Diagnosis not present

## 2017-09-24 DIAGNOSIS — D509 Iron deficiency anemia, unspecified: Secondary | ICD-10-CM | POA: Diagnosis not present

## 2017-09-24 DIAGNOSIS — N186 End stage renal disease: Secondary | ICD-10-CM | POA: Diagnosis not present

## 2017-09-24 DIAGNOSIS — N2581 Secondary hyperparathyroidism of renal origin: Secondary | ICD-10-CM | POA: Diagnosis not present

## 2017-09-24 DIAGNOSIS — E440 Moderate protein-calorie malnutrition: Secondary | ICD-10-CM | POA: Diagnosis not present

## 2017-09-25 DIAGNOSIS — D631 Anemia in chronic kidney disease: Secondary | ICD-10-CM | POA: Diagnosis not present

## 2017-09-25 DIAGNOSIS — E440 Moderate protein-calorie malnutrition: Secondary | ICD-10-CM | POA: Diagnosis not present

## 2017-09-25 DIAGNOSIS — N2581 Secondary hyperparathyroidism of renal origin: Secondary | ICD-10-CM | POA: Diagnosis not present

## 2017-09-25 DIAGNOSIS — R17 Unspecified jaundice: Secondary | ICD-10-CM | POA: Diagnosis not present

## 2017-09-25 DIAGNOSIS — D509 Iron deficiency anemia, unspecified: Secondary | ICD-10-CM | POA: Diagnosis not present

## 2017-09-25 DIAGNOSIS — N186 End stage renal disease: Secondary | ICD-10-CM | POA: Diagnosis not present

## 2017-09-26 DIAGNOSIS — D509 Iron deficiency anemia, unspecified: Secondary | ICD-10-CM | POA: Diagnosis not present

## 2017-09-26 DIAGNOSIS — E440 Moderate protein-calorie malnutrition: Secondary | ICD-10-CM | POA: Diagnosis not present

## 2017-09-26 DIAGNOSIS — N2581 Secondary hyperparathyroidism of renal origin: Secondary | ICD-10-CM | POA: Diagnosis not present

## 2017-09-26 DIAGNOSIS — R17 Unspecified jaundice: Secondary | ICD-10-CM | POA: Diagnosis not present

## 2017-09-26 DIAGNOSIS — D631 Anemia in chronic kidney disease: Secondary | ICD-10-CM | POA: Diagnosis not present

## 2017-09-26 DIAGNOSIS — N186 End stage renal disease: Secondary | ICD-10-CM | POA: Diagnosis not present

## 2017-09-27 DIAGNOSIS — N186 End stage renal disease: Secondary | ICD-10-CM | POA: Diagnosis not present

## 2017-09-27 DIAGNOSIS — R17 Unspecified jaundice: Secondary | ICD-10-CM | POA: Diagnosis not present

## 2017-09-27 DIAGNOSIS — D631 Anemia in chronic kidney disease: Secondary | ICD-10-CM | POA: Diagnosis not present

## 2017-09-27 DIAGNOSIS — D509 Iron deficiency anemia, unspecified: Secondary | ICD-10-CM | POA: Diagnosis not present

## 2017-09-27 DIAGNOSIS — N2581 Secondary hyperparathyroidism of renal origin: Secondary | ICD-10-CM | POA: Diagnosis not present

## 2017-09-27 DIAGNOSIS — E440 Moderate protein-calorie malnutrition: Secondary | ICD-10-CM | POA: Diagnosis not present

## 2017-09-28 DIAGNOSIS — R17 Unspecified jaundice: Secondary | ICD-10-CM | POA: Diagnosis not present

## 2017-09-28 DIAGNOSIS — N186 End stage renal disease: Secondary | ICD-10-CM | POA: Diagnosis not present

## 2017-09-28 DIAGNOSIS — D631 Anemia in chronic kidney disease: Secondary | ICD-10-CM | POA: Diagnosis not present

## 2017-09-28 DIAGNOSIS — D509 Iron deficiency anemia, unspecified: Secondary | ICD-10-CM | POA: Diagnosis not present

## 2017-09-28 DIAGNOSIS — E440 Moderate protein-calorie malnutrition: Secondary | ICD-10-CM | POA: Diagnosis not present

## 2017-09-28 DIAGNOSIS — N2581 Secondary hyperparathyroidism of renal origin: Secondary | ICD-10-CM | POA: Diagnosis not present

## 2017-09-29 ENCOUNTER — Ambulatory Visit: Payer: BC Managed Care – PPO

## 2017-09-29 ENCOUNTER — Encounter (HOSPITAL_COMMUNITY): Payer: BC Managed Care – PPO

## 2017-09-29 DIAGNOSIS — N186 End stage renal disease: Secondary | ICD-10-CM | POA: Diagnosis not present

## 2017-09-29 DIAGNOSIS — D631 Anemia in chronic kidney disease: Secondary | ICD-10-CM | POA: Diagnosis not present

## 2017-09-29 DIAGNOSIS — R17 Unspecified jaundice: Secondary | ICD-10-CM | POA: Diagnosis not present

## 2017-09-29 DIAGNOSIS — D509 Iron deficiency anemia, unspecified: Secondary | ICD-10-CM | POA: Diagnosis not present

## 2017-09-29 DIAGNOSIS — N2581 Secondary hyperparathyroidism of renal origin: Secondary | ICD-10-CM | POA: Diagnosis not present

## 2017-09-29 DIAGNOSIS — E440 Moderate protein-calorie malnutrition: Secondary | ICD-10-CM | POA: Diagnosis not present

## 2017-09-30 DIAGNOSIS — N186 End stage renal disease: Secondary | ICD-10-CM | POA: Diagnosis not present

## 2017-09-30 DIAGNOSIS — E440 Moderate protein-calorie malnutrition: Secondary | ICD-10-CM | POA: Diagnosis not present

## 2017-09-30 DIAGNOSIS — D631 Anemia in chronic kidney disease: Secondary | ICD-10-CM | POA: Diagnosis not present

## 2017-09-30 DIAGNOSIS — D509 Iron deficiency anemia, unspecified: Secondary | ICD-10-CM | POA: Diagnosis not present

## 2017-09-30 DIAGNOSIS — N2581 Secondary hyperparathyroidism of renal origin: Secondary | ICD-10-CM | POA: Diagnosis not present

## 2017-09-30 DIAGNOSIS — R17 Unspecified jaundice: Secondary | ICD-10-CM | POA: Diagnosis not present

## 2017-10-01 DIAGNOSIS — D631 Anemia in chronic kidney disease: Secondary | ICD-10-CM | POA: Diagnosis not present

## 2017-10-01 DIAGNOSIS — E440 Moderate protein-calorie malnutrition: Secondary | ICD-10-CM | POA: Diagnosis not present

## 2017-10-01 DIAGNOSIS — N186 End stage renal disease: Secondary | ICD-10-CM | POA: Diagnosis not present

## 2017-10-01 DIAGNOSIS — N2581 Secondary hyperparathyroidism of renal origin: Secondary | ICD-10-CM | POA: Diagnosis not present

## 2017-10-01 DIAGNOSIS — D509 Iron deficiency anemia, unspecified: Secondary | ICD-10-CM | POA: Diagnosis not present

## 2017-10-01 DIAGNOSIS — R17 Unspecified jaundice: Secondary | ICD-10-CM | POA: Diagnosis not present

## 2017-10-02 DIAGNOSIS — R17 Unspecified jaundice: Secondary | ICD-10-CM | POA: Diagnosis not present

## 2017-10-02 DIAGNOSIS — D509 Iron deficiency anemia, unspecified: Secondary | ICD-10-CM | POA: Diagnosis not present

## 2017-10-02 DIAGNOSIS — D631 Anemia in chronic kidney disease: Secondary | ICD-10-CM | POA: Diagnosis not present

## 2017-10-02 DIAGNOSIS — N2581 Secondary hyperparathyroidism of renal origin: Secondary | ICD-10-CM | POA: Diagnosis not present

## 2017-10-02 DIAGNOSIS — N186 End stage renal disease: Secondary | ICD-10-CM | POA: Diagnosis not present

## 2017-10-02 DIAGNOSIS — E440 Moderate protein-calorie malnutrition: Secondary | ICD-10-CM | POA: Diagnosis not present

## 2017-10-03 DIAGNOSIS — E440 Moderate protein-calorie malnutrition: Secondary | ICD-10-CM | POA: Diagnosis not present

## 2017-10-03 DIAGNOSIS — D509 Iron deficiency anemia, unspecified: Secondary | ICD-10-CM | POA: Diagnosis not present

## 2017-10-03 DIAGNOSIS — N186 End stage renal disease: Secondary | ICD-10-CM | POA: Diagnosis not present

## 2017-10-03 DIAGNOSIS — D631 Anemia in chronic kidney disease: Secondary | ICD-10-CM | POA: Diagnosis not present

## 2017-10-03 DIAGNOSIS — R17 Unspecified jaundice: Secondary | ICD-10-CM | POA: Diagnosis not present

## 2017-10-03 DIAGNOSIS — N2581 Secondary hyperparathyroidism of renal origin: Secondary | ICD-10-CM | POA: Diagnosis not present

## 2017-10-04 DIAGNOSIS — Z79899 Other long term (current) drug therapy: Secondary | ICD-10-CM | POA: Diagnosis not present

## 2017-10-04 DIAGNOSIS — Z4932 Encounter for adequacy testing for peritoneal dialysis: Secondary | ICD-10-CM | POA: Diagnosis not present

## 2017-10-04 DIAGNOSIS — Z992 Dependence on renal dialysis: Secondary | ICD-10-CM | POA: Diagnosis not present

## 2017-10-04 DIAGNOSIS — N2589 Other disorders resulting from impaired renal tubular function: Secondary | ICD-10-CM | POA: Diagnosis not present

## 2017-10-04 DIAGNOSIS — R17 Unspecified jaundice: Secondary | ICD-10-CM | POA: Diagnosis not present

## 2017-10-04 DIAGNOSIS — E440 Moderate protein-calorie malnutrition: Secondary | ICD-10-CM | POA: Diagnosis not present

## 2017-10-04 DIAGNOSIS — M311 Thrombotic microangiopathy: Secondary | ICD-10-CM | POA: Diagnosis not present

## 2017-10-04 DIAGNOSIS — N186 End stage renal disease: Secondary | ICD-10-CM | POA: Diagnosis not present

## 2017-10-04 DIAGNOSIS — E876 Hypokalemia: Secondary | ICD-10-CM | POA: Diagnosis not present

## 2017-10-04 DIAGNOSIS — D631 Anemia in chronic kidney disease: Secondary | ICD-10-CM | POA: Diagnosis not present

## 2017-10-04 DIAGNOSIS — N2581 Secondary hyperparathyroidism of renal origin: Secondary | ICD-10-CM | POA: Diagnosis not present

## 2017-10-04 DIAGNOSIS — D509 Iron deficiency anemia, unspecified: Secondary | ICD-10-CM | POA: Diagnosis not present

## 2017-10-05 DIAGNOSIS — E876 Hypokalemia: Secondary | ICD-10-CM | POA: Diagnosis not present

## 2017-10-05 DIAGNOSIS — Z4932 Encounter for adequacy testing for peritoneal dialysis: Secondary | ICD-10-CM | POA: Diagnosis not present

## 2017-10-05 DIAGNOSIS — N186 End stage renal disease: Secondary | ICD-10-CM | POA: Diagnosis not present

## 2017-10-05 DIAGNOSIS — D631 Anemia in chronic kidney disease: Secondary | ICD-10-CM | POA: Diagnosis not present

## 2017-10-05 DIAGNOSIS — D509 Iron deficiency anemia, unspecified: Secondary | ICD-10-CM | POA: Diagnosis not present

## 2017-10-05 DIAGNOSIS — N2581 Secondary hyperparathyroidism of renal origin: Secondary | ICD-10-CM | POA: Diagnosis not present

## 2017-10-06 DIAGNOSIS — D509 Iron deficiency anemia, unspecified: Secondary | ICD-10-CM | POA: Diagnosis not present

## 2017-10-06 DIAGNOSIS — E876 Hypokalemia: Secondary | ICD-10-CM | POA: Diagnosis not present

## 2017-10-06 DIAGNOSIS — N2581 Secondary hyperparathyroidism of renal origin: Secondary | ICD-10-CM | POA: Diagnosis not present

## 2017-10-06 DIAGNOSIS — Z4932 Encounter for adequacy testing for peritoneal dialysis: Secondary | ICD-10-CM | POA: Diagnosis not present

## 2017-10-06 DIAGNOSIS — D631 Anemia in chronic kidney disease: Secondary | ICD-10-CM | POA: Diagnosis not present

## 2017-10-06 DIAGNOSIS — N186 End stage renal disease: Secondary | ICD-10-CM | POA: Diagnosis not present

## 2017-10-07 ENCOUNTER — Encounter (HOSPITAL_COMMUNITY): Payer: BC Managed Care – PPO

## 2017-10-07 ENCOUNTER — Ambulatory Visit: Payer: BC Managed Care – PPO

## 2017-10-07 DIAGNOSIS — N186 End stage renal disease: Secondary | ICD-10-CM | POA: Diagnosis not present

## 2017-10-07 DIAGNOSIS — N2581 Secondary hyperparathyroidism of renal origin: Secondary | ICD-10-CM | POA: Diagnosis not present

## 2017-10-07 DIAGNOSIS — Z4932 Encounter for adequacy testing for peritoneal dialysis: Secondary | ICD-10-CM | POA: Diagnosis not present

## 2017-10-07 DIAGNOSIS — D631 Anemia in chronic kidney disease: Secondary | ICD-10-CM | POA: Diagnosis not present

## 2017-10-07 DIAGNOSIS — E876 Hypokalemia: Secondary | ICD-10-CM | POA: Diagnosis not present

## 2017-10-07 DIAGNOSIS — D509 Iron deficiency anemia, unspecified: Secondary | ICD-10-CM | POA: Diagnosis not present

## 2017-10-08 DIAGNOSIS — D631 Anemia in chronic kidney disease: Secondary | ICD-10-CM | POA: Diagnosis not present

## 2017-10-08 DIAGNOSIS — D509 Iron deficiency anemia, unspecified: Secondary | ICD-10-CM | POA: Diagnosis not present

## 2017-10-08 DIAGNOSIS — N2581 Secondary hyperparathyroidism of renal origin: Secondary | ICD-10-CM | POA: Diagnosis not present

## 2017-10-08 DIAGNOSIS — E876 Hypokalemia: Secondary | ICD-10-CM | POA: Diagnosis not present

## 2017-10-08 DIAGNOSIS — N186 End stage renal disease: Secondary | ICD-10-CM | POA: Diagnosis not present

## 2017-10-08 DIAGNOSIS — Z4932 Encounter for adequacy testing for peritoneal dialysis: Secondary | ICD-10-CM | POA: Diagnosis not present

## 2017-10-09 DIAGNOSIS — N186 End stage renal disease: Secondary | ICD-10-CM | POA: Diagnosis not present

## 2017-10-09 DIAGNOSIS — Z4932 Encounter for adequacy testing for peritoneal dialysis: Secondary | ICD-10-CM | POA: Diagnosis not present

## 2017-10-09 DIAGNOSIS — D631 Anemia in chronic kidney disease: Secondary | ICD-10-CM | POA: Diagnosis not present

## 2017-10-09 DIAGNOSIS — D509 Iron deficiency anemia, unspecified: Secondary | ICD-10-CM | POA: Diagnosis not present

## 2017-10-09 DIAGNOSIS — N2581 Secondary hyperparathyroidism of renal origin: Secondary | ICD-10-CM | POA: Diagnosis not present

## 2017-10-09 DIAGNOSIS — E876 Hypokalemia: Secondary | ICD-10-CM | POA: Diagnosis not present

## 2017-10-10 DIAGNOSIS — N186 End stage renal disease: Secondary | ICD-10-CM | POA: Diagnosis not present

## 2017-10-10 DIAGNOSIS — D631 Anemia in chronic kidney disease: Secondary | ICD-10-CM | POA: Diagnosis not present

## 2017-10-10 DIAGNOSIS — D509 Iron deficiency anemia, unspecified: Secondary | ICD-10-CM | POA: Diagnosis not present

## 2017-10-10 DIAGNOSIS — Z4932 Encounter for adequacy testing for peritoneal dialysis: Secondary | ICD-10-CM | POA: Diagnosis not present

## 2017-10-10 DIAGNOSIS — N2581 Secondary hyperparathyroidism of renal origin: Secondary | ICD-10-CM | POA: Diagnosis not present

## 2017-10-10 DIAGNOSIS — E876 Hypokalemia: Secondary | ICD-10-CM | POA: Diagnosis not present

## 2017-10-11 DIAGNOSIS — E876 Hypokalemia: Secondary | ICD-10-CM | POA: Diagnosis not present

## 2017-10-11 DIAGNOSIS — E7849 Other hyperlipidemia: Secondary | ICD-10-CM | POA: Diagnosis not present

## 2017-10-11 DIAGNOSIS — D631 Anemia in chronic kidney disease: Secondary | ICD-10-CM | POA: Diagnosis not present

## 2017-10-11 DIAGNOSIS — N186 End stage renal disease: Secondary | ICD-10-CM | POA: Diagnosis not present

## 2017-10-11 DIAGNOSIS — Z4932 Encounter for adequacy testing for peritoneal dialysis: Secondary | ICD-10-CM | POA: Diagnosis not present

## 2017-10-11 DIAGNOSIS — N2581 Secondary hyperparathyroidism of renal origin: Secondary | ICD-10-CM | POA: Diagnosis not present

## 2017-10-11 DIAGNOSIS — D509 Iron deficiency anemia, unspecified: Secondary | ICD-10-CM | POA: Diagnosis not present

## 2017-10-12 DIAGNOSIS — N186 End stage renal disease: Secondary | ICD-10-CM | POA: Diagnosis not present

## 2017-10-12 DIAGNOSIS — N2581 Secondary hyperparathyroidism of renal origin: Secondary | ICD-10-CM | POA: Diagnosis not present

## 2017-10-12 DIAGNOSIS — Z4932 Encounter for adequacy testing for peritoneal dialysis: Secondary | ICD-10-CM | POA: Diagnosis not present

## 2017-10-12 DIAGNOSIS — D509 Iron deficiency anemia, unspecified: Secondary | ICD-10-CM | POA: Diagnosis not present

## 2017-10-12 DIAGNOSIS — E876 Hypokalemia: Secondary | ICD-10-CM | POA: Diagnosis not present

## 2017-10-12 DIAGNOSIS — D631 Anemia in chronic kidney disease: Secondary | ICD-10-CM | POA: Diagnosis not present

## 2017-10-13 DIAGNOSIS — D631 Anemia in chronic kidney disease: Secondary | ICD-10-CM | POA: Diagnosis not present

## 2017-10-13 DIAGNOSIS — Z4932 Encounter for adequacy testing for peritoneal dialysis: Secondary | ICD-10-CM | POA: Diagnosis not present

## 2017-10-13 DIAGNOSIS — N2581 Secondary hyperparathyroidism of renal origin: Secondary | ICD-10-CM | POA: Diagnosis not present

## 2017-10-13 DIAGNOSIS — E876 Hypokalemia: Secondary | ICD-10-CM | POA: Diagnosis not present

## 2017-10-13 DIAGNOSIS — N186 End stage renal disease: Secondary | ICD-10-CM | POA: Diagnosis not present

## 2017-10-13 DIAGNOSIS — D509 Iron deficiency anemia, unspecified: Secondary | ICD-10-CM | POA: Diagnosis not present

## 2017-10-14 DIAGNOSIS — E876 Hypokalemia: Secondary | ICD-10-CM | POA: Diagnosis not present

## 2017-10-14 DIAGNOSIS — N2581 Secondary hyperparathyroidism of renal origin: Secondary | ICD-10-CM | POA: Diagnosis not present

## 2017-10-14 DIAGNOSIS — N186 End stage renal disease: Secondary | ICD-10-CM | POA: Diagnosis not present

## 2017-10-14 DIAGNOSIS — Z4932 Encounter for adequacy testing for peritoneal dialysis: Secondary | ICD-10-CM | POA: Diagnosis not present

## 2017-10-14 DIAGNOSIS — D509 Iron deficiency anemia, unspecified: Secondary | ICD-10-CM | POA: Diagnosis not present

## 2017-10-14 DIAGNOSIS — D631 Anemia in chronic kidney disease: Secondary | ICD-10-CM | POA: Diagnosis not present

## 2017-10-15 DIAGNOSIS — E876 Hypokalemia: Secondary | ICD-10-CM | POA: Diagnosis not present

## 2017-10-15 DIAGNOSIS — D631 Anemia in chronic kidney disease: Secondary | ICD-10-CM | POA: Diagnosis not present

## 2017-10-15 DIAGNOSIS — Z4932 Encounter for adequacy testing for peritoneal dialysis: Secondary | ICD-10-CM | POA: Diagnosis not present

## 2017-10-15 DIAGNOSIS — D509 Iron deficiency anemia, unspecified: Secondary | ICD-10-CM | POA: Diagnosis not present

## 2017-10-15 DIAGNOSIS — N186 End stage renal disease: Secondary | ICD-10-CM | POA: Diagnosis not present

## 2017-10-15 DIAGNOSIS — N2581 Secondary hyperparathyroidism of renal origin: Secondary | ICD-10-CM | POA: Diagnosis not present

## 2017-10-16 DIAGNOSIS — E876 Hypokalemia: Secondary | ICD-10-CM | POA: Diagnosis not present

## 2017-10-16 DIAGNOSIS — N2581 Secondary hyperparathyroidism of renal origin: Secondary | ICD-10-CM | POA: Diagnosis not present

## 2017-10-16 DIAGNOSIS — D631 Anemia in chronic kidney disease: Secondary | ICD-10-CM | POA: Diagnosis not present

## 2017-10-16 DIAGNOSIS — N186 End stage renal disease: Secondary | ICD-10-CM | POA: Diagnosis not present

## 2017-10-16 DIAGNOSIS — Z4932 Encounter for adequacy testing for peritoneal dialysis: Secondary | ICD-10-CM | POA: Diagnosis not present

## 2017-10-16 DIAGNOSIS — D509 Iron deficiency anemia, unspecified: Secondary | ICD-10-CM | POA: Diagnosis not present

## 2017-10-17 DIAGNOSIS — D631 Anemia in chronic kidney disease: Secondary | ICD-10-CM | POA: Diagnosis not present

## 2017-10-17 DIAGNOSIS — N186 End stage renal disease: Secondary | ICD-10-CM | POA: Diagnosis not present

## 2017-10-17 DIAGNOSIS — N2581 Secondary hyperparathyroidism of renal origin: Secondary | ICD-10-CM | POA: Diagnosis not present

## 2017-10-17 DIAGNOSIS — D509 Iron deficiency anemia, unspecified: Secondary | ICD-10-CM | POA: Diagnosis not present

## 2017-10-17 DIAGNOSIS — Z4932 Encounter for adequacy testing for peritoneal dialysis: Secondary | ICD-10-CM | POA: Diagnosis not present

## 2017-10-17 DIAGNOSIS — E876 Hypokalemia: Secondary | ICD-10-CM | POA: Diagnosis not present

## 2017-10-18 DIAGNOSIS — N186 End stage renal disease: Secondary | ICD-10-CM | POA: Diagnosis not present

## 2017-10-18 DIAGNOSIS — N2581 Secondary hyperparathyroidism of renal origin: Secondary | ICD-10-CM | POA: Diagnosis not present

## 2017-10-18 DIAGNOSIS — Z4932 Encounter for adequacy testing for peritoneal dialysis: Secondary | ICD-10-CM | POA: Diagnosis not present

## 2017-10-18 DIAGNOSIS — E876 Hypokalemia: Secondary | ICD-10-CM | POA: Diagnosis not present

## 2017-10-18 DIAGNOSIS — D631 Anemia in chronic kidney disease: Secondary | ICD-10-CM | POA: Diagnosis not present

## 2017-10-18 DIAGNOSIS — D509 Iron deficiency anemia, unspecified: Secondary | ICD-10-CM | POA: Diagnosis not present

## 2017-10-19 DIAGNOSIS — D509 Iron deficiency anemia, unspecified: Secondary | ICD-10-CM | POA: Diagnosis not present

## 2017-10-19 DIAGNOSIS — N2581 Secondary hyperparathyroidism of renal origin: Secondary | ICD-10-CM | POA: Diagnosis not present

## 2017-10-19 DIAGNOSIS — N186 End stage renal disease: Secondary | ICD-10-CM | POA: Diagnosis not present

## 2017-10-19 DIAGNOSIS — E876 Hypokalemia: Secondary | ICD-10-CM | POA: Diagnosis not present

## 2017-10-19 DIAGNOSIS — Z4932 Encounter for adequacy testing for peritoneal dialysis: Secondary | ICD-10-CM | POA: Diagnosis not present

## 2017-10-19 DIAGNOSIS — D631 Anemia in chronic kidney disease: Secondary | ICD-10-CM | POA: Diagnosis not present

## 2017-10-20 ENCOUNTER — Ambulatory Visit (INDEPENDENT_AMBULATORY_CARE_PROVIDER_SITE_OTHER): Payer: Medicare Other | Admitting: Physician Assistant

## 2017-10-20 ENCOUNTER — Other Ambulatory Visit: Payer: Self-pay | Admitting: Vascular Surgery

## 2017-10-20 ENCOUNTER — Ambulatory Visit (HOSPITAL_COMMUNITY)
Admission: RE | Admit: 2017-10-20 | Discharge: 2017-10-20 | Disposition: A | Discharge status: Home / Self Care | Payer: Medicare Other | Source: Ambulatory Visit | Attending: Vascular Surgery | Admitting: Vascular Surgery

## 2017-10-20 ENCOUNTER — Encounter: Payer: Self-pay | Admitting: Vascular Surgery

## 2017-10-20 VITALS — BP 123/76 | HR 86 | Temp 98.1°F | Resp 16 | Ht 60.0 in | Wt 114.0 lb

## 2017-10-20 DIAGNOSIS — D631 Anemia in chronic kidney disease: Secondary | ICD-10-CM | POA: Diagnosis not present

## 2017-10-20 DIAGNOSIS — Z992 Dependence on renal dialysis: Secondary | ICD-10-CM

## 2017-10-20 DIAGNOSIS — E876 Hypokalemia: Secondary | ICD-10-CM | POA: Diagnosis not present

## 2017-10-20 DIAGNOSIS — N2581 Secondary hyperparathyroidism of renal origin: Secondary | ICD-10-CM | POA: Diagnosis not present

## 2017-10-20 DIAGNOSIS — T82898A Other specified complication of vascular prosthetic devices, implants and grafts, initial encounter: Secondary | ICD-10-CM | POA: Insufficient documentation

## 2017-10-20 DIAGNOSIS — Z4932 Encounter for adequacy testing for peritoneal dialysis: Secondary | ICD-10-CM | POA: Diagnosis not present

## 2017-10-20 DIAGNOSIS — D509 Iron deficiency anemia, unspecified: Secondary | ICD-10-CM | POA: Diagnosis not present

## 2017-10-20 DIAGNOSIS — N186 End stage renal disease: Secondary | ICD-10-CM | POA: Diagnosis not present

## 2017-10-20 NOTE — Progress Notes (Signed)
Established Dialysis Access   History of Present Illness   Erin Morgan is a 62 y.o. ([birth year]) female who presents for re-evaluation for permanent access.  Surgical history is significant for L brachiocephalic fistula creation by Dr. Bridgett Larsson on 11/2014.  She underwent revision with plication of pseudoaneurysm by Dr. Scot Dock 10/01/17.  Second area of pseudoaneurysm was left alone at the time to avoid need for tunneled dialysis catheter.  Patient also had a fistulogram by Dr. Posey Pronto 07/2016 which demonstrated mild cephalic arch narrowing.  She has since switched to peritoneal dialysis and has not required a HD treatment for about 6 months.  She however is concerned on a daily basis about the vulnerability of her remaining fistula pseudoaneurysm.  Patient believes she will have "peace of mind" if this area was surgically repaired.  She previously had an episode of bleeding from this pseudoaneurysm when fistula was still in use and would like to prevent this from happening again.  ESRD is managed by Dr. Alinda Dooms.  She is not taking any blood thinners.  She has not had any chest pain or SOB.  The patient's PMH, PSH, SH, and FamHx were reviewed and are unchanged from prior office visit.  Current Outpatient Medications  Medication Sig Dispense Refill  . acetaminophen (TYLENOL) 500 MG tablet Take 1,000 mg by mouth every 8 (eight) hours as needed for headache.    . fluocinonide (LIDEX) 0.05 % external solution Apply 1 application topically 2 (two) times daily as needed (MIX WITH CETAPHIL).     . hydrALAZINE (APRESOLINE) 50 MG tablet Take 1 tablet (50 mg total) by mouth every 8 (eight) hours. (Patient taking differently: Take 50 mg by mouth 2 (two) times daily. ) 90 tablet 0  . hydrOXYzine (ATARAX/VISTARIL) 25 MG tablet Take 25 mg by mouth 2 (two) times daily.    Marland Kitchen labetalol (NORMODYNE) 200 MG tablet Take 200 mg by mouth 2 (two) times daily.     . multivitamin (RENA-VIT) TABS tablet Take 1 tablet by  mouth at bedtime. 30 each 0  . omeprazole (PRILOSEC) 20 MG capsule Take 20 mg by mouth 2 (two) times daily before a meal.    . oxyCODONE-acetaminophen (PERCOCET/ROXICET) 5-325 MG tablet Take 1 tablet by mouth every 6 (six) hours as needed. 6 tablet 0  . sevelamer carbonate (RENVELA) 800 MG tablet Take 1,600 mg by mouth 3 (three) times daily with meals. Take med when eating protein - up to 4 times daily    . SOLIRIS 300 MG/30ML SOLN injection Inject into the vein every 14 (fourteen) days.     No current facility-administered medications for this visit.      Physical Examination   Vitals:   10/20/17 1528  BP: 123/76  Pulse: 86  Resp: 16  Temp: 98.1 F (36.7 C)  TempSrc: Oral  SpO2: 99%  Weight: 114 lb (51.7 kg)  Height: 5' (1.524 m)   Body mass index is 22.26 kg/m.  General Alert, O x 3, WD, NAD  Pulmonary Sym exp, good B air movt, CTA B  Cardiac RRR, Nl S1, S2,   Vascular Vessel Right Left  Radial Palpable Palpable  Brachial Palpable Palpable  Ulnar Not palpable Not palpable    Musculo- skeletal M/S 5/5 throughout  , Extremities without ischemic changes; patent fistula with palpable thrill and audible bruit; large pseudoaneurysmal area L arm AV fistula, some skin thinning but no wounds or ulcerations; skin is mobile over pseudoaneurysm    Neurologic A&O; CN grossly intact  Non-invasive Vascular Imaging   left Arm Access Duplex  (10/20/17):   Diameters:  > 2 cm in area of aneurysm   Depth:  Less than 0.4 cm   Medical Decision Making   Erin Morgan is a 63 y.o. female who presents with ESRD on peritoneal dialysis.  Large pseudoaneurysmal area of L arm brachiocephalic fistula   Patient expressed her concern for bleeding/rupture of pseudoaneurysmal area  I am able to palpate a thrill near her axilla, thus I do not believe there is any hemodynamically significant outflow stenosis  Plan will be for revision with plication of L arm AV fistula; She has requested  Dr. Scot Dock to perform this surgery Risk, benefits, and alternatives to access surgery were discussed including bleeding, infection, nerve damage, thrombosis, need for additional procedures, and possible need for ligation of fistula. The patient agrees to proceed forward with the procedure.   Dagoberto Ligas PA-C [facility] Office: [phone]

## 2017-10-21 ENCOUNTER — Encounter (HOSPITAL_COMMUNITY): Payer: Self-pay | Admitting: Physician Assistant

## 2017-10-21 DIAGNOSIS — D509 Iron deficiency anemia, unspecified: Secondary | ICD-10-CM | POA: Diagnosis not present

## 2017-10-21 DIAGNOSIS — N186 End stage renal disease: Secondary | ICD-10-CM | POA: Diagnosis not present

## 2017-10-21 DIAGNOSIS — E876 Hypokalemia: Secondary | ICD-10-CM | POA: Diagnosis not present

## 2017-10-21 DIAGNOSIS — D631 Anemia in chronic kidney disease: Secondary | ICD-10-CM | POA: Diagnosis not present

## 2017-10-21 DIAGNOSIS — Z4932 Encounter for adequacy testing for peritoneal dialysis: Secondary | ICD-10-CM | POA: Diagnosis not present

## 2017-10-21 DIAGNOSIS — N2581 Secondary hyperparathyroidism of renal origin: Secondary | ICD-10-CM | POA: Diagnosis not present

## 2017-10-22 DIAGNOSIS — N186 End stage renal disease: Secondary | ICD-10-CM | POA: Diagnosis not present

## 2017-10-22 DIAGNOSIS — N2581 Secondary hyperparathyroidism of renal origin: Secondary | ICD-10-CM | POA: Diagnosis not present

## 2017-10-22 DIAGNOSIS — D509 Iron deficiency anemia, unspecified: Secondary | ICD-10-CM | POA: Diagnosis not present

## 2017-10-22 DIAGNOSIS — E876 Hypokalemia: Secondary | ICD-10-CM | POA: Diagnosis not present

## 2017-10-22 DIAGNOSIS — Z4932 Encounter for adequacy testing for peritoneal dialysis: Secondary | ICD-10-CM | POA: Diagnosis not present

## 2017-10-22 DIAGNOSIS — D631 Anemia in chronic kidney disease: Secondary | ICD-10-CM | POA: Diagnosis not present

## 2017-10-23 DIAGNOSIS — N186 End stage renal disease: Secondary | ICD-10-CM | POA: Diagnosis not present

## 2017-10-23 DIAGNOSIS — N2581 Secondary hyperparathyroidism of renal origin: Secondary | ICD-10-CM | POA: Diagnosis not present

## 2017-10-23 DIAGNOSIS — D631 Anemia in chronic kidney disease: Secondary | ICD-10-CM | POA: Diagnosis not present

## 2017-10-23 DIAGNOSIS — D509 Iron deficiency anemia, unspecified: Secondary | ICD-10-CM | POA: Diagnosis not present

## 2017-10-23 DIAGNOSIS — E876 Hypokalemia: Secondary | ICD-10-CM | POA: Diagnosis not present

## 2017-10-23 DIAGNOSIS — Z4932 Encounter for adequacy testing for peritoneal dialysis: Secondary | ICD-10-CM | POA: Diagnosis not present

## 2017-10-24 DIAGNOSIS — Z4932 Encounter for adequacy testing for peritoneal dialysis: Secondary | ICD-10-CM | POA: Diagnosis not present

## 2017-10-24 DIAGNOSIS — D509 Iron deficiency anemia, unspecified: Secondary | ICD-10-CM | POA: Diagnosis not present

## 2017-10-24 DIAGNOSIS — D631 Anemia in chronic kidney disease: Secondary | ICD-10-CM | POA: Diagnosis not present

## 2017-10-24 DIAGNOSIS — N2581 Secondary hyperparathyroidism of renal origin: Secondary | ICD-10-CM | POA: Diagnosis not present

## 2017-10-24 DIAGNOSIS — N186 End stage renal disease: Secondary | ICD-10-CM | POA: Diagnosis not present

## 2017-10-24 DIAGNOSIS — E876 Hypokalemia: Secondary | ICD-10-CM | POA: Diagnosis not present

## 2017-10-25 DIAGNOSIS — Z4932 Encounter for adequacy testing for peritoneal dialysis: Secondary | ICD-10-CM | POA: Diagnosis not present

## 2017-10-25 DIAGNOSIS — D509 Iron deficiency anemia, unspecified: Secondary | ICD-10-CM | POA: Diagnosis not present

## 2017-10-25 DIAGNOSIS — N186 End stage renal disease: Secondary | ICD-10-CM | POA: Diagnosis not present

## 2017-10-25 DIAGNOSIS — N2581 Secondary hyperparathyroidism of renal origin: Secondary | ICD-10-CM | POA: Diagnosis not present

## 2017-10-25 DIAGNOSIS — E876 Hypokalemia: Secondary | ICD-10-CM | POA: Diagnosis not present

## 2017-10-25 DIAGNOSIS — D631 Anemia in chronic kidney disease: Secondary | ICD-10-CM | POA: Diagnosis not present

## 2017-10-26 DIAGNOSIS — D509 Iron deficiency anemia, unspecified: Secondary | ICD-10-CM | POA: Diagnosis not present

## 2017-10-26 DIAGNOSIS — Z4932 Encounter for adequacy testing for peritoneal dialysis: Secondary | ICD-10-CM | POA: Diagnosis not present

## 2017-10-26 DIAGNOSIS — N186 End stage renal disease: Secondary | ICD-10-CM | POA: Diagnosis not present

## 2017-10-26 DIAGNOSIS — E876 Hypokalemia: Secondary | ICD-10-CM | POA: Diagnosis not present

## 2017-10-26 DIAGNOSIS — N2581 Secondary hyperparathyroidism of renal origin: Secondary | ICD-10-CM | POA: Diagnosis not present

## 2017-10-26 DIAGNOSIS — D631 Anemia in chronic kidney disease: Secondary | ICD-10-CM | POA: Diagnosis not present

## 2017-10-27 DIAGNOSIS — E876 Hypokalemia: Secondary | ICD-10-CM | POA: Diagnosis not present

## 2017-10-27 DIAGNOSIS — D631 Anemia in chronic kidney disease: Secondary | ICD-10-CM | POA: Diagnosis not present

## 2017-10-27 DIAGNOSIS — Z4932 Encounter for adequacy testing for peritoneal dialysis: Secondary | ICD-10-CM | POA: Diagnosis not present

## 2017-10-27 DIAGNOSIS — N186 End stage renal disease: Secondary | ICD-10-CM | POA: Diagnosis not present

## 2017-10-27 DIAGNOSIS — N2581 Secondary hyperparathyroidism of renal origin: Secondary | ICD-10-CM | POA: Diagnosis not present

## 2017-10-27 DIAGNOSIS — D509 Iron deficiency anemia, unspecified: Secondary | ICD-10-CM | POA: Diagnosis not present

## 2017-10-28 ENCOUNTER — Encounter (HOSPITAL_COMMUNITY): Payer: Self-pay | Admitting: *Deleted

## 2017-10-28 ENCOUNTER — Other Ambulatory Visit: Payer: Self-pay

## 2017-10-28 DIAGNOSIS — D509 Iron deficiency anemia, unspecified: Secondary | ICD-10-CM | POA: Diagnosis not present

## 2017-10-28 DIAGNOSIS — N2581 Secondary hyperparathyroidism of renal origin: Secondary | ICD-10-CM | POA: Diagnosis not present

## 2017-10-28 DIAGNOSIS — N186 End stage renal disease: Secondary | ICD-10-CM | POA: Diagnosis not present

## 2017-10-28 DIAGNOSIS — D631 Anemia in chronic kidney disease: Secondary | ICD-10-CM | POA: Diagnosis not present

## 2017-10-28 DIAGNOSIS — E876 Hypokalemia: Secondary | ICD-10-CM | POA: Diagnosis not present

## 2017-10-28 DIAGNOSIS — Z4932 Encounter for adequacy testing for peritoneal dialysis: Secondary | ICD-10-CM | POA: Diagnosis not present

## 2017-10-28 NOTE — Progress Notes (Signed)
Spoke with patient for pre-op call. Pt denies cardiac history, chest pain or sob. Pt states she is not diabetic.

## 2017-10-29 DIAGNOSIS — N2581 Secondary hyperparathyroidism of renal origin: Secondary | ICD-10-CM | POA: Diagnosis not present

## 2017-10-29 DIAGNOSIS — N186 End stage renal disease: Secondary | ICD-10-CM | POA: Diagnosis not present

## 2017-10-29 DIAGNOSIS — D631 Anemia in chronic kidney disease: Secondary | ICD-10-CM | POA: Diagnosis not present

## 2017-10-29 DIAGNOSIS — D509 Iron deficiency anemia, unspecified: Secondary | ICD-10-CM | POA: Diagnosis not present

## 2017-10-29 DIAGNOSIS — Z4932 Encounter for adequacy testing for peritoneal dialysis: Secondary | ICD-10-CM | POA: Diagnosis not present

## 2017-10-29 DIAGNOSIS — E876 Hypokalemia: Secondary | ICD-10-CM | POA: Diagnosis not present

## 2017-10-30 DIAGNOSIS — D509 Iron deficiency anemia, unspecified: Secondary | ICD-10-CM | POA: Diagnosis not present

## 2017-10-30 DIAGNOSIS — D631 Anemia in chronic kidney disease: Secondary | ICD-10-CM | POA: Diagnosis not present

## 2017-10-30 DIAGNOSIS — Z4932 Encounter for adequacy testing for peritoneal dialysis: Secondary | ICD-10-CM | POA: Diagnosis not present

## 2017-10-30 DIAGNOSIS — N2581 Secondary hyperparathyroidism of renal origin: Secondary | ICD-10-CM | POA: Diagnosis not present

## 2017-10-30 DIAGNOSIS — N186 End stage renal disease: Secondary | ICD-10-CM | POA: Diagnosis not present

## 2017-10-30 DIAGNOSIS — E876 Hypokalemia: Secondary | ICD-10-CM | POA: Diagnosis not present

## 2017-10-31 ENCOUNTER — Other Ambulatory Visit: Payer: Self-pay

## 2017-10-31 ENCOUNTER — Ambulatory Visit (HOSPITAL_COMMUNITY): Payer: Medicare Other | Admitting: Physician Assistant

## 2017-10-31 ENCOUNTER — Ambulatory Visit (HOSPITAL_COMMUNITY)
Admission: RE | Admit: 2017-10-31 | Discharge: 2017-10-31 | Disposition: A | Discharge status: Home / Self Care | Payer: Medicare Other | Source: Ambulatory Visit | Attending: Vascular Surgery | Admitting: Vascular Surgery

## 2017-10-31 ENCOUNTER — Encounter (HOSPITAL_COMMUNITY)
Admission: RE | Disposition: A | Discharge status: Home / Self Care | Payer: Self-pay | Source: Ambulatory Visit | Attending: Vascular Surgery

## 2017-10-31 ENCOUNTER — Encounter (HOSPITAL_COMMUNITY): Payer: Self-pay | Admitting: Surgery

## 2017-10-31 DIAGNOSIS — E876 Hypokalemia: Secondary | ICD-10-CM | POA: Diagnosis not present

## 2017-10-31 DIAGNOSIS — Z9889 Other specified postprocedural states: Secondary | ICD-10-CM | POA: Diagnosis not present

## 2017-10-31 DIAGNOSIS — N186 End stage renal disease: Secondary | ICD-10-CM | POA: Diagnosis not present

## 2017-10-31 DIAGNOSIS — K219 Gastro-esophageal reflux disease without esophagitis: Secondary | ICD-10-CM | POA: Diagnosis not present

## 2017-10-31 DIAGNOSIS — Z79899 Other long term (current) drug therapy: Secondary | ICD-10-CM | POA: Diagnosis not present

## 2017-10-31 DIAGNOSIS — M8580 Other specified disorders of bone density and structure, unspecified site: Secondary | ICD-10-CM | POA: Insufficient documentation

## 2017-10-31 DIAGNOSIS — I120 Hypertensive chronic kidney disease with stage 5 chronic kidney disease or end stage renal disease: Secondary | ICD-10-CM | POA: Diagnosis not present

## 2017-10-31 DIAGNOSIS — I491 Atrial premature depolarization: Secondary | ICD-10-CM | POA: Diagnosis not present

## 2017-10-31 DIAGNOSIS — D631 Anemia in chronic kidney disease: Secondary | ICD-10-CM | POA: Diagnosis not present

## 2017-10-31 DIAGNOSIS — Z4932 Encounter for adequacy testing for peritoneal dialysis: Secondary | ICD-10-CM | POA: Diagnosis not present

## 2017-10-31 DIAGNOSIS — M069 Rheumatoid arthritis, unspecified: Secondary | ICD-10-CM | POA: Diagnosis not present

## 2017-10-31 DIAGNOSIS — Z992 Dependence on renal dialysis: Secondary | ICD-10-CM | POA: Insufficient documentation

## 2017-10-31 DIAGNOSIS — I770 Arteriovenous fistula, acquired: Secondary | ICD-10-CM | POA: Diagnosis not present

## 2017-10-31 DIAGNOSIS — D509 Iron deficiency anemia, unspecified: Secondary | ICD-10-CM | POA: Diagnosis not present

## 2017-10-31 DIAGNOSIS — N2581 Secondary hyperparathyroidism of renal origin: Secondary | ICD-10-CM | POA: Diagnosis not present

## 2017-10-31 HISTORY — DX: Thoracic aortic aneurysm, without rupture: I71.2

## 2017-10-31 HISTORY — PX: REVISON OF ARTERIOVENOUS FISTULA: SHX6074

## 2017-10-31 LAB — POCT I-STAT 4, (NA,K, GLUC, HGB,HCT)
Glucose, Bld: 93 mg/dL (ref 70–99)
HCT: 27 % — ABNORMAL LOW (ref 36.0–46.0)
Hemoglobin: 9.2 g/dL — ABNORMAL LOW (ref 12.0–15.0)
POTASSIUM: 2.7 mmol/L — AB (ref 3.5–5.1)
SODIUM: 131 mmol/L — AB (ref 135–145)

## 2017-10-31 SURGERY — REVISON OF ARTERIOVENOUS FISTULA
Anesthesia: Monitor Anesthesia Care | Site: Arm Upper | Laterality: Left

## 2017-10-31 MED ORDER — LIDOCAINE HCL (PF) 1 % IJ SOLN
INTRAMUSCULAR | Status: AC
  Filled 2017-10-31: qty 30

## 2017-10-31 MED ORDER — ONDANSETRON HCL 4 MG/2ML IJ SOLN
INTRAMUSCULAR | Status: DC | PRN
  Administered 2017-10-31: 4 mg via INTRAVENOUS

## 2017-10-31 MED ORDER — SODIUM CHLORIDE 0.9 % IR SOLN
Status: DC | PRN
  Administered 2017-10-31: 1000 mL

## 2017-10-31 MED ORDER — HYDROMORPHONE HCL 1 MG/ML IJ SOLN: 0.2500 mg | INTRAMUSCULAR | Status: DC | PRN

## 2017-10-31 MED ORDER — LIDOCAINE-EPINEPHRINE (PF) 1 %-1:200000 IJ SOLN
INTRAMUSCULAR | Status: DC | PRN
  Administered 2017-10-31: 30 mL

## 2017-10-31 MED ORDER — DEXAMETHASONE SODIUM PHOSPHATE 10 MG/ML IJ SOLN
INTRAMUSCULAR | Status: DC | PRN
  Administered 2017-10-31: 4 mg via INTRAVENOUS

## 2017-10-31 MED ORDER — HEPARIN SODIUM (PORCINE) 1000 UNIT/ML IJ SOLN
INTRAMUSCULAR | Status: DC | PRN
  Administered 2017-10-31: 5000 [IU] via INTRAVENOUS

## 2017-10-31 MED ORDER — MIDAZOLAM HCL 2 MG/2ML IJ SOLN
INTRAMUSCULAR | Status: AC
  Filled 2017-10-31: qty 2

## 2017-10-31 MED ORDER — LIDOCAINE HCL (PF) 1 % IJ SOLN
INTRAMUSCULAR | Status: DC | PRN
  Administered 2017-10-31: 30 mL

## 2017-10-31 MED ORDER — PHENYLEPHRINE 40 MCG/ML (10ML) SYRINGE FOR IV PUSH (FOR BLOOD PRESSURE SUPPORT)
PREFILLED_SYRINGE | INTRAVENOUS | Status: AC
  Filled 2017-10-31: qty 10

## 2017-10-31 MED ORDER — MEPERIDINE HCL 50 MG/ML IJ SOLN: 6.2500 mg | INTRAMUSCULAR | Status: DC | PRN

## 2017-10-31 MED ORDER — ONDANSETRON HCL 4 MG/2ML IJ SOLN: 4.0000 mg | Freq: Once | INTRAMUSCULAR | Status: DC | PRN

## 2017-10-31 MED ORDER — SODIUM CHLORIDE 0.9 % IV SOLN
INTRAVENOUS | Status: AC
  Filled 2017-10-31: qty 1.2

## 2017-10-31 MED ORDER — DEXAMETHASONE SODIUM PHOSPHATE 10 MG/ML IJ SOLN
INTRAMUSCULAR | Status: AC
  Filled 2017-10-31: qty 1

## 2017-10-31 MED ORDER — FENTANYL CITRATE (PF) 250 MCG/5ML IJ SOLN
INTRAMUSCULAR | Status: AC
  Filled 2017-10-31: qty 5

## 2017-10-31 MED ORDER — MIDAZOLAM HCL 5 MG/5ML IJ SOLN
INTRAMUSCULAR | Status: DC | PRN
  Administered 2017-10-31: 2 mg via INTRAVENOUS

## 2017-10-31 MED ORDER — PROTAMINE SULFATE 10 MG/ML IV SOLN
INTRAVENOUS | Status: DC | PRN
  Administered 2017-10-31: 10 mg via INTRAVENOUS
  Administered 2017-10-31: 20 mg via INTRAVENOUS

## 2017-10-31 MED ORDER — EPHEDRINE 5 MG/ML INJ
INTRAVENOUS | Status: AC
  Filled 2017-10-31: qty 10

## 2017-10-31 MED ORDER — FENTANYL CITRATE (PF) 250 MCG/5ML IJ SOLN
INTRAMUSCULAR | Status: DC | PRN
  Administered 2017-10-31: 50 ug via INTRAVENOUS

## 2017-10-31 MED ORDER — PROPOFOL 10 MG/ML IV BOLUS
INTRAVENOUS | Status: AC
  Filled 2017-10-31: qty 20

## 2017-10-31 MED ORDER — OXYCODONE HCL 5 MG PO TABS: 5.0000 mg | ORAL_TABLET | ORAL | 0 refills | Status: DC | PRN

## 2017-10-31 MED ORDER — LIDOCAINE 2% (20 MG/ML) 5 ML SYRINGE
INTRAMUSCULAR | Status: AC
  Filled 2017-10-31: qty 5

## 2017-10-31 MED ORDER — ONDANSETRON HCL 4 MG/2ML IJ SOLN
INTRAMUSCULAR | Status: AC
  Filled 2017-10-31: qty 2

## 2017-10-31 MED ORDER — SODIUM CHLORIDE 0.9 % IV SOLN
INTRAVENOUS | Status: DC | PRN
  Administered 2017-10-31: 07:00:00

## 2017-10-31 MED ORDER — HEPARIN SODIUM (PORCINE) 1000 UNIT/ML IJ SOLN
INTRAMUSCULAR | Status: AC
  Filled 2017-10-31: qty 1

## 2017-10-31 MED ORDER — SUCCINYLCHOLINE CHLORIDE 200 MG/10ML IV SOSY
PREFILLED_SYRINGE | INTRAVENOUS | Status: AC
  Filled 2017-10-31: qty 10

## 2017-10-31 MED ORDER — PROPOFOL 500 MG/50ML IV EMUL
INTRAVENOUS | Status: DC | PRN
  Administered 2017-10-31: 50 ug/kg/min via INTRAVENOUS

## 2017-10-31 MED ORDER — CEFAZOLIN SODIUM-DEXTROSE 2-4 GM/100ML-% IV SOLN
2.0000 g | INTRAVENOUS | Status: AC
  Administered 2017-10-31: 2 g via INTRAVENOUS
  Filled 2017-10-31: qty 100

## 2017-10-31 MED ORDER — SODIUM CHLORIDE 0.9 % IV SOLN
INTRAVENOUS | Status: DC
  Administered 2017-10-31: 07:00:00 via INTRAVENOUS

## 2017-10-31 MED ORDER — LIDOCAINE 2% (20 MG/ML) 5 ML SYRINGE
INTRAMUSCULAR | Status: DC | PRN
  Administered 2017-10-31: 50 mg via INTRAVENOUS

## 2017-10-31 MED ORDER — LIDOCAINE-EPINEPHRINE (PF) 1 %-1:200000 IJ SOLN
INTRAMUSCULAR | Status: AC
  Filled 2017-10-31: qty 30

## 2017-10-31 SURGICAL SUPPLY — 38 items
ARMBAND PINK RESTRICT EXTREMIT (MISCELLANEOUS) ×3 IMPLANT
CANISTER SUCT 3000ML PPV (MISCELLANEOUS) ×3 IMPLANT
CANNULA VESSEL 3MM 2 BLNT TIP (CANNULA) ×3 IMPLANT
CLIP VESOCCLUDE MED 6/CT (CLIP) ×3 IMPLANT
CLIP VESOCCLUDE SM WIDE 6/CT (CLIP) ×3 IMPLANT
COVER PROBE W GEL 5X96 (DRAPES) IMPLANT
COVER WAND RF STERILE (DRAPES) ×3 IMPLANT
DERMABOND ADVANCED (GAUZE/BANDAGES/DRESSINGS) ×2
DERMABOND ADVANCED .7 DNX12 (GAUZE/BANDAGES/DRESSINGS) ×1 IMPLANT
ELECT REM PT RETURN 9FT ADLT (ELECTROSURGICAL) ×3
ELECTRODE REM PT RTRN 9FT ADLT (ELECTROSURGICAL) ×1 IMPLANT
GLOVE BIO SURGEON STRL SZ 6.5 (GLOVE) ×2 IMPLANT
GLOVE BIO SURGEON STRL SZ7.5 (GLOVE) ×6 IMPLANT
GLOVE BIO SURGEONS STRL SZ 6.5 (GLOVE) ×1
GLOVE BIOGEL PI IND STRL 6.5 (GLOVE) ×2 IMPLANT
GLOVE BIOGEL PI IND STRL 7.0 (GLOVE) ×1 IMPLANT
GLOVE BIOGEL PI IND STRL 8 (GLOVE) ×1 IMPLANT
GLOVE BIOGEL PI INDICATOR 6.5 (GLOVE) ×4
GLOVE BIOGEL PI INDICATOR 7.0 (GLOVE) ×2
GLOVE BIOGEL PI INDICATOR 8 (GLOVE) ×2
GOWN STRL REUS W/ TWL LRG LVL3 (GOWN DISPOSABLE) ×3 IMPLANT
GOWN STRL REUS W/ TWL XL LVL3 (GOWN DISPOSABLE) ×1 IMPLANT
GOWN STRL REUS W/TWL LRG LVL3 (GOWN DISPOSABLE) ×6
GOWN STRL REUS W/TWL XL LVL3 (GOWN DISPOSABLE) ×2
KIT BASIN OR (CUSTOM PROCEDURE TRAY) ×3 IMPLANT
KIT TURNOVER KIT B (KITS) ×3 IMPLANT
NS IRRIG 1000ML POUR BTL (IV SOLUTION) ×3 IMPLANT
PACK CV ACCESS (CUSTOM PROCEDURE TRAY) ×3 IMPLANT
PAD ARMBOARD 7.5X6 YLW CONV (MISCELLANEOUS) ×6 IMPLANT
SPONGE SURGIFOAM ABS GEL 100 (HEMOSTASIS) IMPLANT
SUT PROLENE 5 0 C 1 24 (SUTURE) ×3 IMPLANT
SUT PROLENE 6 0 BV (SUTURE) ×6 IMPLANT
SUT VIC AB 3-0 SH 27 (SUTURE) ×2
SUT VIC AB 3-0 SH 27X BRD (SUTURE) ×1 IMPLANT
SUT VICRYL 4-0 PS2 18IN ABS (SUTURE) ×3 IMPLANT
TOWEL GREEN STERILE (TOWEL DISPOSABLE) ×3 IMPLANT
UNDERPAD 30X30 (UNDERPADS AND DIAPERS) ×3 IMPLANT
WATER STERILE IRR 1000ML POUR (IV SOLUTION) ×3 IMPLANT

## 2017-10-31 NOTE — Op Note (Signed)
    NAME: Erin Morgan    MRN: [id number] DOB: 1955/11/18    DATE OF OPERATION: 10/31/2017  PREOP DIAGNOSIS:    Aneurysmal left upper arm fistula  POSTOP DIAGNOSIS:    Same  PROCEDURE:    Plication of aneurysm of left upper arm fistula  SURGEON: Judeth Cornfield. Scot Dock, MD, FACS  ASSIST: Arlee Muslim, PA  ANESTHESIA: Local with sedation  EBL: Minimal  INDICATIONS:    Erin Morgan is a 62 y.o. female who is developed a large aneurysm in the central portion of her left upper arm fistula.  She presents for plication.  She is currently on peritoneal dialysis.  FINDINGS:   Excellent thrill at the completion of the procedure.  TECHNIQUE:   The patient was taken to the operating room and sedated by anesthesia.  The left upper extremity was prepped and draped in usual sterile fashion.  After the skin was anesthetized with 1% lidocaine, an elliptical incision was made over this large aneurysm.  The aneurysm was dissected free circumferentially back to normal-appearing fistula at each end.  The patient was heparinized.  The fistula was clamped proximally and distally and a large ellipse of the aneurysm was excised on the lateral anterior wall.  The vein was then closed primarily with running 5-0 Prolene suture.  The clamps were released and there was an excellent thrill in the fistula.  I then slightly rolled the fistula using 4 tacking 6-0 sutures to protect the suture line from the anterior surface.  The wound was then closed with a deep layer of 3-0 Vicryl and the skin closed with 4-0 Vicryl.  Dermabond was applied.  The patient tolerated the procedure well and was transferred to the recovery room in stable condition.  All needle and sponge counts were correct.  [name], MD, FACS [facility]  DATE OF DICTATION:   10/31/2017

## 2017-10-31 NOTE — H&P (Signed)
Established Dialysis Access   History of Present Illness   Erin Morgan is a 62 y.o. (Mar 23, 1955) female who presents for re-evaluation for permanent access.  Surgical history is significant for L brachiocephalic fistula creation by Dr. Bridgett Larsson on 11/2014.  She underwent revision with plication of pseudoaneurysm by Dr. Scot Dock 10/01/17.  Second area of pseudoaneurysm was left alone at the time to avoid need for tunneled dialysis catheter.  Patient also had a fistulogram by Dr. Posey Pronto 07/2016 which demonstrated mild cephalic arch narrowing.  She has since switched to peritoneal dialysis and has not required a HD treatment for about 6 months.  She however is concerned on a daily basis about the vulnerability of her remaining fistula pseudoaneurysm.  Patient believes she will have "peace of mind" if this area was surgically repaired.  She previously had an episode of bleeding from this pseudoaneurysm when fistula was still in use and would like to prevent this from happening again.  ESRD is managed by Dr. Alinda Dooms.  She is not taking any blood thinners.  She has not had any chest pain or SOB.  The patient's PMH, PSH, SH, and FamHx were reviewed and are unchanged from prior office visit.        Current Outpatient Medications  Medication Sig Dispense Refill  . acetaminophen (TYLENOL) 500 MG tablet Take 1,000 mg by mouth every 8 (eight) hours as needed for headache.    . fluocinonide (LIDEX) 0.05 % external solution Apply 1 application topically 2 (two) times daily as needed (MIX WITH CETAPHIL).     . hydrALAZINE (APRESOLINE) 50 MG tablet Take 1 tablet (50 mg total) by mouth every 8 (eight) hours. (Patient taking differently: Take 50 mg by mouth 2 (two) times daily. ) 90 tablet 0  . hydrOXYzine (ATARAX/VISTARIL) 25 MG tablet Take 25 mg by mouth 2 (two) times daily.    Marland Kitchen labetalol (NORMODYNE) 200 MG tablet Take 200 mg by mouth 2 (two) times daily.     . multivitamin (RENA-VIT) TABS  tablet Take 1 tablet by mouth at bedtime. 30 each 0  . omeprazole (PRILOSEC) 20 MG capsule Take 20 mg by mouth 2 (two) times daily before a meal.    . oxyCODONE-acetaminophen (PERCOCET/ROXICET) 5-325 MG tablet Take 1 tablet by mouth every 6 (six) hours as needed. 6 tablet 0  . sevelamer carbonate (RENVELA) 800 MG tablet Take 1,600 mg by mouth 3 (three) times daily with meals. Take med when eating protein - up to 4 times daily    . SOLIRIS 300 MG/30ML SOLN injection Inject into the vein every 14 (fourteen) days.     No current facility-administered medications for this visit.      Physical Examination      Vitals:   10/20/17 1528  BP: 123/76  Pulse: 86  Resp: 16  Temp: 98.1 F (36.7 C)  TempSrc: Oral  SpO2: 99%  Weight: 114 lb (51.7 kg)  Height: 5' (1.524 m)   Body mass index is 22.26 kg/m.  General Alert, O x 3, WD, NAD  Pulmonary Sym exp, good B air movt, CTA B  Cardiac RRR, Nl S1, S2,   Vascular Vessel Right Left  Radial Palpable Palpable  Brachial Palpable Palpable  Ulnar Not palpable Not palpable    Musculo- skeletal M/S 5/5 throughout  , Extremities without ischemic changes; patent fistula with palpable thrill and audible bruit; large pseudoaneurysmal area L arm AV fistula, some skin thinning but no wounds or ulcerations; skin is mobile over  pseudoaneurysm    Neurologic A&O; CN grossly intact     Non-invasive Vascular Imaging   left Arm Access Duplex  (10/20/17):   Diameters:  > 2 cm in area of aneurysm   Depth:  Less than 0.4 cm   Medical Decision Making   [name] is a 62 y.o. female who presents with ESRD on peritoneal dialysis.  Large pseudoaneurysmal area of L arm brachiocephalic fistula   Patient expressed her concern for bleeding/rupture of pseudoaneurysmal area  I am able to palpate a thrill near her axilla, thus I do not believe there is any hemodynamically significant outflow stenosis  Plan will be for revision  with plication of L arm AV fistula; She has requested Dr. Scot Dock to perform this surgery  Risk, benefits, and alternatives to access surgery were discussed including bleeding, infection, nerve damage, thrombosis, need for additional procedures, and possible need for ligation of fistula.  The patient agrees to proceed forward with the procedure.   Dagoberto Ligas PA-C [facility] [place] Office: [phone]  I have interviewed the patient and examined the patient. I agree with the findings by the PA.  Gae Gallop, MD [phone]

## 2017-10-31 NOTE — Discharge Instructions (Signed)

## 2017-10-31 NOTE — Anesthesia Postprocedure Evaluation (Signed)
Anesthesia Post Note  Patient: Erin Morgan  Procedure(s) Performed: PLICATION OF LEFT ARM  ARTERIOVENOUS FISTULA (Left Arm Upper)     Patient location during evaluation: PACU Anesthesia Type: MAC Level of consciousness: awake and alert Pain management: pain level controlled Vital Signs Assessment: post-procedure vital signs reviewed and stable Respiratory status: spontaneous breathing, nonlabored ventilation, respiratory function stable and patient connected to nasal cannula oxygen Cardiovascular status: stable and blood pressure returned to baseline Postop Assessment: no apparent nausea or vomiting Anesthetic complications: no    Last Vitals:  Vitals:   10/31/17 0921 10/31/17 0924  BP:  112/86  Pulse: 81 72  Resp: 18 16  Temp:    SpO2: 95% 96%    Last Pain:  Vitals:   10/31/17 0924  TempSrc:   PainSc: 0-No pain                 Mayra Brahm DAVID

## 2017-10-31 NOTE — Anesthesia Procedure Notes (Signed)
Procedure Name: MAC Date/Time: 10/31/2017 7:30 PM Performed by: Myna Bright, CRNA Pre-anesthesia Checklist: Patient identified, Emergency Drugs available, Suction available and Patient being monitored Patient Re-evaluated:Patient Re-evaluated prior to induction Oxygen Delivery Method: Nasal cannula Placement Confirmation: positive ETCO2 and breath sounds checked- equal and bilateral

## 2017-10-31 NOTE — Transfer of Care (Signed)
Immediate Anesthesia Transfer of Care Note  Patient: Erin Morgan  Procedure(s) Performed: PLICATION OF LEFT ARM  ARTERIOVENOUS FISTULA (Left Arm Upper)  Patient Location: PACU  Anesthesia Type:MAC  Level of Consciousness: awake, alert  and oriented  Airway & Oxygen Therapy: Patient Spontanous Breathing and Patient connected to nasal cannula oxygen  Post-op Assessment: Report given to RN and Post -op Vital signs reviewed and stable  Post vital signs: Reviewed and stable  Last Vitals:  Vitals Value Taken Time  BP 127/86 10/31/2017  8:41 AM  Temp    Pulse 71 10/31/2017  8:42 AM  Resp 18 10/31/2017  8:42 AM  SpO2 98 % 10/31/2017  8:42 AM  Vitals shown include unvalidated device data.  Last Pain:  Vitals:   10/31/17 0636  TempSrc:   PainSc: 0-No pain      Patients Stated Pain Goal: 0 (83/50/75 7322)  Complications: No apparent anesthesia complications

## 2017-10-31 NOTE — Progress Notes (Addendum)
CRITICAL VALUE ALERT  Critical Value:  K 2.7  Date & Time Notied:  10/31/17 0610  Provider Notified: 10/31/17 0712 (Dr.Ossey)  Orders Received/Actions taken: none

## 2017-10-31 NOTE — Anesthesia Preprocedure Evaluation (Signed)
Anesthesia Evaluation  Patient identified by MRN, date of birth, ID band Patient awake    Reviewed: Allergy & Precautions, NPO status , Patient's Chart, lab work & pertinent test results  History of Anesthesia Complications (+) PONV  Airway Mallampati: I  TM Distance: >3 FB Neck ROM: Full    Dental   Pulmonary    Pulmonary exam normal        Cardiovascular hypertension, Pt. on medications Normal cardiovascular exam     Neuro/Psych    GI/Hepatic GERD  Medicated and Controlled,  Endo/Other    Renal/GU ESRF and DialysisRenal disease     Musculoskeletal   Abdominal   Peds  Hematology   Anesthesia Other Findings   Reproductive/Obstetrics                             Anesthesia Physical Anesthesia Plan  ASA: III  Anesthesia Plan: MAC   Post-op Pain Management:    Induction: Intravenous  PONV Risk Score and Plan: 3 and Ondansetron and Midazolam  Airway Management Planned: Simple Face Mask  Additional Equipment:   Intra-op Plan:   Post-operative Plan:   Informed Consent: I have reviewed the patients History and Physical, chart, labs and discussed the procedure including the risks, benefits and alternatives for the proposed anesthesia with the patient or authorized representative who has indicated his/her understanding and acceptance.     Plan Discussed with: CRNA and Surgeon  Anesthesia Plan Comments:         Anesthesia Quick Evaluation

## 2017-11-01 ENCOUNTER — Encounter (HOSPITAL_COMMUNITY): Payer: Self-pay | Admitting: Vascular Surgery

## 2017-11-01 DIAGNOSIS — D631 Anemia in chronic kidney disease: Secondary | ICD-10-CM | POA: Diagnosis not present

## 2017-11-01 DIAGNOSIS — N186 End stage renal disease: Secondary | ICD-10-CM | POA: Diagnosis not present

## 2017-11-01 DIAGNOSIS — E876 Hypokalemia: Secondary | ICD-10-CM | POA: Diagnosis not present

## 2017-11-01 DIAGNOSIS — Z4932 Encounter for adequacy testing for peritoneal dialysis: Secondary | ICD-10-CM | POA: Diagnosis not present

## 2017-11-01 DIAGNOSIS — D509 Iron deficiency anemia, unspecified: Secondary | ICD-10-CM | POA: Diagnosis not present

## 2017-11-01 DIAGNOSIS — N2581 Secondary hyperparathyroidism of renal origin: Secondary | ICD-10-CM | POA: Diagnosis not present

## 2017-11-02 ENCOUNTER — Telehealth: Payer: Self-pay | Admitting: Vascular Surgery

## 2017-11-02 DIAGNOSIS — D509 Iron deficiency anemia, unspecified: Secondary | ICD-10-CM | POA: Diagnosis not present

## 2017-11-02 DIAGNOSIS — H2513 Age-related nuclear cataract, bilateral: Secondary | ICD-10-CM | POA: Diagnosis not present

## 2017-11-02 DIAGNOSIS — Z79899 Other long term (current) drug therapy: Secondary | ICD-10-CM | POA: Diagnosis not present

## 2017-11-02 DIAGNOSIS — H00012 Hordeolum externum right lower eyelid: Secondary | ICD-10-CM | POA: Diagnosis not present

## 2017-11-02 DIAGNOSIS — H524 Presbyopia: Secondary | ICD-10-CM | POA: Diagnosis not present

## 2017-11-02 DIAGNOSIS — N2581 Secondary hyperparathyroidism of renal origin: Secondary | ICD-10-CM | POA: Diagnosis not present

## 2017-11-02 DIAGNOSIS — E876 Hypokalemia: Secondary | ICD-10-CM | POA: Diagnosis not present

## 2017-11-02 DIAGNOSIS — D631 Anemia in chronic kidney disease: Secondary | ICD-10-CM | POA: Diagnosis not present

## 2017-11-02 DIAGNOSIS — Z4932 Encounter for adequacy testing for peritoneal dialysis: Secondary | ICD-10-CM | POA: Diagnosis not present

## 2017-11-02 DIAGNOSIS — N186 End stage renal disease: Secondary | ICD-10-CM | POA: Diagnosis not present

## 2017-11-02 NOTE — Telephone Encounter (Signed)
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 Scot Dock, MD, FACS  ASSIST: Arlee Muslim, PA I would like to see her in 4 to 6 weeks on my schedule to look at this.  Thank you. CD

## 2017-11-02 NOTE — Telephone Encounter (Signed)
sch appt spk to pt 12/14/17 1145am p/o MD

## 2017-11-03 DIAGNOSIS — D631 Anemia in chronic kidney disease: Secondary | ICD-10-CM | POA: Diagnosis not present

## 2017-11-03 DIAGNOSIS — D509 Iron deficiency anemia, unspecified: Secondary | ICD-10-CM | POA: Diagnosis not present

## 2017-11-03 DIAGNOSIS — N186 End stage renal disease: Secondary | ICD-10-CM | POA: Diagnosis not present

## 2017-11-03 DIAGNOSIS — N2581 Secondary hyperparathyroidism of renal origin: Secondary | ICD-10-CM | POA: Diagnosis not present

## 2017-11-03 DIAGNOSIS — Z4932 Encounter for adequacy testing for peritoneal dialysis: Secondary | ICD-10-CM | POA: Diagnosis not present

## 2017-11-03 DIAGNOSIS — E876 Hypokalemia: Secondary | ICD-10-CM | POA: Diagnosis not present

## 2017-11-04 DIAGNOSIS — D631 Anemia in chronic kidney disease: Secondary | ICD-10-CM | POA: Diagnosis not present

## 2017-11-04 DIAGNOSIS — N2581 Secondary hyperparathyroidism of renal origin: Secondary | ICD-10-CM | POA: Diagnosis not present

## 2017-11-04 DIAGNOSIS — D509 Iron deficiency anemia, unspecified: Secondary | ICD-10-CM | POA: Diagnosis not present

## 2017-11-04 DIAGNOSIS — Z4932 Encounter for adequacy testing for peritoneal dialysis: Secondary | ICD-10-CM | POA: Diagnosis not present

## 2017-11-04 DIAGNOSIS — M311 Thrombotic microangiopathy: Secondary | ICD-10-CM | POA: Diagnosis not present

## 2017-11-04 DIAGNOSIS — Z79899 Other long term (current) drug therapy: Secondary | ICD-10-CM | POA: Diagnosis not present

## 2017-11-04 DIAGNOSIS — N186 End stage renal disease: Secondary | ICD-10-CM | POA: Diagnosis not present

## 2017-11-04 DIAGNOSIS — E440 Moderate protein-calorie malnutrition: Secondary | ICD-10-CM | POA: Diagnosis not present

## 2017-11-04 DIAGNOSIS — Z992 Dependence on renal dialysis: Secondary | ICD-10-CM | POA: Diagnosis not present

## 2017-11-04 DIAGNOSIS — R17 Unspecified jaundice: Secondary | ICD-10-CM | POA: Diagnosis not present

## 2017-11-05 DIAGNOSIS — Z4932 Encounter for adequacy testing for peritoneal dialysis: Secondary | ICD-10-CM | POA: Diagnosis not present

## 2017-11-05 DIAGNOSIS — E440 Moderate protein-calorie malnutrition: Secondary | ICD-10-CM | POA: Diagnosis not present

## 2017-11-05 DIAGNOSIS — R17 Unspecified jaundice: Secondary | ICD-10-CM | POA: Diagnosis not present

## 2017-11-05 DIAGNOSIS — Z79899 Other long term (current) drug therapy: Secondary | ICD-10-CM | POA: Diagnosis not present

## 2017-11-05 DIAGNOSIS — N186 End stage renal disease: Secondary | ICD-10-CM | POA: Diagnosis not present

## 2017-11-05 DIAGNOSIS — D631 Anemia in chronic kidney disease: Secondary | ICD-10-CM | POA: Diagnosis not present

## 2017-11-06 DIAGNOSIS — R17 Unspecified jaundice: Secondary | ICD-10-CM | POA: Diagnosis not present

## 2017-11-06 DIAGNOSIS — Z4932 Encounter for adequacy testing for peritoneal dialysis: Secondary | ICD-10-CM | POA: Diagnosis not present

## 2017-11-06 DIAGNOSIS — E440 Moderate protein-calorie malnutrition: Secondary | ICD-10-CM | POA: Diagnosis not present

## 2017-11-06 DIAGNOSIS — N186 End stage renal disease: Secondary | ICD-10-CM | POA: Diagnosis not present

## 2017-11-06 DIAGNOSIS — Z79899 Other long term (current) drug therapy: Secondary | ICD-10-CM | POA: Diagnosis not present

## 2017-11-06 DIAGNOSIS — D631 Anemia in chronic kidney disease: Secondary | ICD-10-CM | POA: Diagnosis not present

## 2017-11-07 DIAGNOSIS — E440 Moderate protein-calorie malnutrition: Secondary | ICD-10-CM | POA: Diagnosis not present

## 2017-11-07 DIAGNOSIS — D631 Anemia in chronic kidney disease: Secondary | ICD-10-CM | POA: Diagnosis not present

## 2017-11-07 DIAGNOSIS — N186 End stage renal disease: Secondary | ICD-10-CM | POA: Diagnosis not present

## 2017-11-07 DIAGNOSIS — R17 Unspecified jaundice: Secondary | ICD-10-CM | POA: Diagnosis not present

## 2017-11-07 DIAGNOSIS — Z4932 Encounter for adequacy testing for peritoneal dialysis: Secondary | ICD-10-CM | POA: Diagnosis not present

## 2017-11-07 DIAGNOSIS — Z79899 Other long term (current) drug therapy: Secondary | ICD-10-CM | POA: Diagnosis not present

## 2017-11-08 DIAGNOSIS — Z79899 Other long term (current) drug therapy: Secondary | ICD-10-CM | POA: Diagnosis not present

## 2017-11-08 DIAGNOSIS — N186 End stage renal disease: Secondary | ICD-10-CM | POA: Diagnosis not present

## 2017-11-08 DIAGNOSIS — E440 Moderate protein-calorie malnutrition: Secondary | ICD-10-CM | POA: Diagnosis not present

## 2017-11-08 DIAGNOSIS — D631 Anemia in chronic kidney disease: Secondary | ICD-10-CM | POA: Diagnosis not present

## 2017-11-08 DIAGNOSIS — Z4932 Encounter for adequacy testing for peritoneal dialysis: Secondary | ICD-10-CM | POA: Diagnosis not present

## 2017-11-08 DIAGNOSIS — R17 Unspecified jaundice: Secondary | ICD-10-CM | POA: Diagnosis not present

## 2017-11-09 DIAGNOSIS — N186 End stage renal disease: Secondary | ICD-10-CM | POA: Diagnosis not present

## 2017-11-09 DIAGNOSIS — Z79899 Other long term (current) drug therapy: Secondary | ICD-10-CM | POA: Diagnosis not present

## 2017-11-09 DIAGNOSIS — R17 Unspecified jaundice: Secondary | ICD-10-CM | POA: Diagnosis not present

## 2017-11-09 DIAGNOSIS — E440 Moderate protein-calorie malnutrition: Secondary | ICD-10-CM | POA: Diagnosis not present

## 2017-11-09 DIAGNOSIS — Z4932 Encounter for adequacy testing for peritoneal dialysis: Secondary | ICD-10-CM | POA: Diagnosis not present

## 2017-11-09 DIAGNOSIS — D631 Anemia in chronic kidney disease: Secondary | ICD-10-CM | POA: Diagnosis not present

## 2017-11-10 DIAGNOSIS — R17 Unspecified jaundice: Secondary | ICD-10-CM | POA: Diagnosis not present

## 2017-11-10 DIAGNOSIS — Z4932 Encounter for adequacy testing for peritoneal dialysis: Secondary | ICD-10-CM | POA: Diagnosis not present

## 2017-11-10 DIAGNOSIS — N186 End stage renal disease: Secondary | ICD-10-CM | POA: Diagnosis not present

## 2017-11-10 DIAGNOSIS — Z79899 Other long term (current) drug therapy: Secondary | ICD-10-CM | POA: Diagnosis not present

## 2017-11-10 DIAGNOSIS — E440 Moderate protein-calorie malnutrition: Secondary | ICD-10-CM | POA: Diagnosis not present

## 2017-11-10 DIAGNOSIS — D631 Anemia in chronic kidney disease: Secondary | ICD-10-CM | POA: Diagnosis not present

## 2017-11-11 DIAGNOSIS — E440 Moderate protein-calorie malnutrition: Secondary | ICD-10-CM | POA: Diagnosis not present

## 2017-11-11 DIAGNOSIS — Z4932 Encounter for adequacy testing for peritoneal dialysis: Secondary | ICD-10-CM | POA: Diagnosis not present

## 2017-11-11 DIAGNOSIS — N186 End stage renal disease: Secondary | ICD-10-CM | POA: Diagnosis not present

## 2017-11-11 DIAGNOSIS — R17 Unspecified jaundice: Secondary | ICD-10-CM | POA: Diagnosis not present

## 2017-11-11 DIAGNOSIS — D631 Anemia in chronic kidney disease: Secondary | ICD-10-CM | POA: Diagnosis not present

## 2017-11-11 DIAGNOSIS — Z79899 Other long term (current) drug therapy: Secondary | ICD-10-CM | POA: Diagnosis not present

## 2017-11-12 DIAGNOSIS — N186 End stage renal disease: Secondary | ICD-10-CM | POA: Diagnosis not present

## 2017-11-12 DIAGNOSIS — E440 Moderate protein-calorie malnutrition: Secondary | ICD-10-CM | POA: Diagnosis not present

## 2017-11-12 DIAGNOSIS — Z79899 Other long term (current) drug therapy: Secondary | ICD-10-CM | POA: Diagnosis not present

## 2017-11-12 DIAGNOSIS — Z4932 Encounter for adequacy testing for peritoneal dialysis: Secondary | ICD-10-CM | POA: Diagnosis not present

## 2017-11-12 DIAGNOSIS — D631 Anemia in chronic kidney disease: Secondary | ICD-10-CM | POA: Diagnosis not present

## 2017-11-12 DIAGNOSIS — R17 Unspecified jaundice: Secondary | ICD-10-CM | POA: Diagnosis not present

## 2017-11-13 DIAGNOSIS — D631 Anemia in chronic kidney disease: Secondary | ICD-10-CM | POA: Diagnosis not present

## 2017-11-13 DIAGNOSIS — N186 End stage renal disease: Secondary | ICD-10-CM | POA: Diagnosis not present

## 2017-11-13 DIAGNOSIS — Z79899 Other long term (current) drug therapy: Secondary | ICD-10-CM | POA: Diagnosis not present

## 2017-11-13 DIAGNOSIS — Z4932 Encounter for adequacy testing for peritoneal dialysis: Secondary | ICD-10-CM | POA: Diagnosis not present

## 2017-11-13 DIAGNOSIS — E440 Moderate protein-calorie malnutrition: Secondary | ICD-10-CM | POA: Diagnosis not present

## 2017-11-13 DIAGNOSIS — R17 Unspecified jaundice: Secondary | ICD-10-CM | POA: Diagnosis not present

## 2017-11-14 DIAGNOSIS — E440 Moderate protein-calorie malnutrition: Secondary | ICD-10-CM | POA: Diagnosis not present

## 2017-11-14 DIAGNOSIS — D631 Anemia in chronic kidney disease: Secondary | ICD-10-CM | POA: Diagnosis not present

## 2017-11-14 DIAGNOSIS — N186 End stage renal disease: Secondary | ICD-10-CM | POA: Diagnosis not present

## 2017-11-14 DIAGNOSIS — Z79899 Other long term (current) drug therapy: Secondary | ICD-10-CM | POA: Diagnosis not present

## 2017-11-14 DIAGNOSIS — Z4932 Encounter for adequacy testing for peritoneal dialysis: Secondary | ICD-10-CM | POA: Diagnosis not present

## 2017-11-14 DIAGNOSIS — R17 Unspecified jaundice: Secondary | ICD-10-CM | POA: Diagnosis not present

## 2017-11-15 DIAGNOSIS — N186 End stage renal disease: Secondary | ICD-10-CM | POA: Diagnosis not present

## 2017-11-15 DIAGNOSIS — Z79899 Other long term (current) drug therapy: Secondary | ICD-10-CM | POA: Diagnosis not present

## 2017-11-15 DIAGNOSIS — Z4932 Encounter for adequacy testing for peritoneal dialysis: Secondary | ICD-10-CM | POA: Diagnosis not present

## 2017-11-15 DIAGNOSIS — D631 Anemia in chronic kidney disease: Secondary | ICD-10-CM | POA: Diagnosis not present

## 2017-11-15 DIAGNOSIS — E440 Moderate protein-calorie malnutrition: Secondary | ICD-10-CM | POA: Diagnosis not present

## 2017-11-15 DIAGNOSIS — R17 Unspecified jaundice: Secondary | ICD-10-CM | POA: Diagnosis not present

## 2017-11-16 DIAGNOSIS — D631 Anemia in chronic kidney disease: Secondary | ICD-10-CM | POA: Diagnosis not present

## 2017-11-16 DIAGNOSIS — E440 Moderate protein-calorie malnutrition: Secondary | ICD-10-CM | POA: Diagnosis not present

## 2017-11-16 DIAGNOSIS — Z79899 Other long term (current) drug therapy: Secondary | ICD-10-CM | POA: Diagnosis not present

## 2017-11-16 DIAGNOSIS — Z4932 Encounter for adequacy testing for peritoneal dialysis: Secondary | ICD-10-CM | POA: Diagnosis not present

## 2017-11-16 DIAGNOSIS — R17 Unspecified jaundice: Secondary | ICD-10-CM | POA: Diagnosis not present

## 2017-11-16 DIAGNOSIS — N186 End stage renal disease: Secondary | ICD-10-CM | POA: Diagnosis not present

## 2017-11-17 DIAGNOSIS — R17 Unspecified jaundice: Secondary | ICD-10-CM | POA: Diagnosis not present

## 2017-11-17 DIAGNOSIS — Z4932 Encounter for adequacy testing for peritoneal dialysis: Secondary | ICD-10-CM | POA: Diagnosis not present

## 2017-11-17 DIAGNOSIS — Z79899 Other long term (current) drug therapy: Secondary | ICD-10-CM | POA: Diagnosis not present

## 2017-11-17 DIAGNOSIS — E440 Moderate protein-calorie malnutrition: Secondary | ICD-10-CM | POA: Diagnosis not present

## 2017-11-17 DIAGNOSIS — N186 End stage renal disease: Secondary | ICD-10-CM | POA: Diagnosis not present

## 2017-11-17 DIAGNOSIS — D631 Anemia in chronic kidney disease: Secondary | ICD-10-CM | POA: Diagnosis not present

## 2017-11-18 DIAGNOSIS — D631 Anemia in chronic kidney disease: Secondary | ICD-10-CM | POA: Diagnosis not present

## 2017-11-18 DIAGNOSIS — Z4932 Encounter for adequacy testing for peritoneal dialysis: Secondary | ICD-10-CM | POA: Diagnosis not present

## 2017-11-18 DIAGNOSIS — R17 Unspecified jaundice: Secondary | ICD-10-CM | POA: Diagnosis not present

## 2017-11-18 DIAGNOSIS — E440 Moderate protein-calorie malnutrition: Secondary | ICD-10-CM | POA: Diagnosis not present

## 2017-11-18 DIAGNOSIS — Z79899 Other long term (current) drug therapy: Secondary | ICD-10-CM | POA: Diagnosis not present

## 2017-11-18 DIAGNOSIS — N186 End stage renal disease: Secondary | ICD-10-CM | POA: Diagnosis not present

## 2017-11-19 DIAGNOSIS — N186 End stage renal disease: Secondary | ICD-10-CM | POA: Diagnosis not present

## 2017-11-19 DIAGNOSIS — R17 Unspecified jaundice: Secondary | ICD-10-CM | POA: Diagnosis not present

## 2017-11-19 DIAGNOSIS — D631 Anemia in chronic kidney disease: Secondary | ICD-10-CM | POA: Diagnosis not present

## 2017-11-19 DIAGNOSIS — Z79899 Other long term (current) drug therapy: Secondary | ICD-10-CM | POA: Diagnosis not present

## 2017-11-19 DIAGNOSIS — Z4932 Encounter for adequacy testing for peritoneal dialysis: Secondary | ICD-10-CM | POA: Diagnosis not present

## 2017-11-19 DIAGNOSIS — E440 Moderate protein-calorie malnutrition: Secondary | ICD-10-CM | POA: Diagnosis not present

## 2017-11-20 DIAGNOSIS — Z4932 Encounter for adequacy testing for peritoneal dialysis: Secondary | ICD-10-CM | POA: Diagnosis not present

## 2017-11-20 DIAGNOSIS — E440 Moderate protein-calorie malnutrition: Secondary | ICD-10-CM | POA: Diagnosis not present

## 2017-11-20 DIAGNOSIS — N186 End stage renal disease: Secondary | ICD-10-CM | POA: Diagnosis not present

## 2017-11-20 DIAGNOSIS — R17 Unspecified jaundice: Secondary | ICD-10-CM | POA: Diagnosis not present

## 2017-11-20 DIAGNOSIS — D631 Anemia in chronic kidney disease: Secondary | ICD-10-CM | POA: Diagnosis not present

## 2017-11-20 DIAGNOSIS — Z79899 Other long term (current) drug therapy: Secondary | ICD-10-CM | POA: Diagnosis not present

## 2017-11-21 DIAGNOSIS — N186 End stage renal disease: Secondary | ICD-10-CM | POA: Diagnosis not present

## 2017-11-21 DIAGNOSIS — R17 Unspecified jaundice: Secondary | ICD-10-CM | POA: Diagnosis not present

## 2017-11-21 DIAGNOSIS — D631 Anemia in chronic kidney disease: Secondary | ICD-10-CM | POA: Diagnosis not present

## 2017-11-21 DIAGNOSIS — E440 Moderate protein-calorie malnutrition: Secondary | ICD-10-CM | POA: Diagnosis not present

## 2017-11-21 DIAGNOSIS — Z79899 Other long term (current) drug therapy: Secondary | ICD-10-CM | POA: Diagnosis not present

## 2017-11-21 DIAGNOSIS — Z4932 Encounter for adequacy testing for peritoneal dialysis: Secondary | ICD-10-CM | POA: Diagnosis not present

## 2017-11-22 DIAGNOSIS — Z4932 Encounter for adequacy testing for peritoneal dialysis: Secondary | ICD-10-CM | POA: Diagnosis not present

## 2017-11-22 DIAGNOSIS — N186 End stage renal disease: Secondary | ICD-10-CM | POA: Diagnosis not present

## 2017-11-22 DIAGNOSIS — Z79899 Other long term (current) drug therapy: Secondary | ICD-10-CM | POA: Diagnosis not present

## 2017-11-22 DIAGNOSIS — D631 Anemia in chronic kidney disease: Secondary | ICD-10-CM | POA: Diagnosis not present

## 2017-11-22 DIAGNOSIS — R17 Unspecified jaundice: Secondary | ICD-10-CM | POA: Diagnosis not present

## 2017-11-22 DIAGNOSIS — E440 Moderate protein-calorie malnutrition: Secondary | ICD-10-CM | POA: Diagnosis not present

## 2017-11-23 DIAGNOSIS — R17 Unspecified jaundice: Secondary | ICD-10-CM | POA: Diagnosis not present

## 2017-11-23 DIAGNOSIS — D631 Anemia in chronic kidney disease: Secondary | ICD-10-CM | POA: Diagnosis not present

## 2017-11-23 DIAGNOSIS — E440 Moderate protein-calorie malnutrition: Secondary | ICD-10-CM | POA: Diagnosis not present

## 2017-11-23 DIAGNOSIS — Z4932 Encounter for adequacy testing for peritoneal dialysis: Secondary | ICD-10-CM | POA: Diagnosis not present

## 2017-11-23 DIAGNOSIS — N186 End stage renal disease: Secondary | ICD-10-CM | POA: Diagnosis not present

## 2017-11-23 DIAGNOSIS — Z79899 Other long term (current) drug therapy: Secondary | ICD-10-CM | POA: Diagnosis not present

## 2017-11-24 DIAGNOSIS — E440 Moderate protein-calorie malnutrition: Secondary | ICD-10-CM | POA: Diagnosis not present

## 2017-11-24 DIAGNOSIS — Z79899 Other long term (current) drug therapy: Secondary | ICD-10-CM | POA: Diagnosis not present

## 2017-11-24 DIAGNOSIS — D631 Anemia in chronic kidney disease: Secondary | ICD-10-CM | POA: Diagnosis not present

## 2017-11-24 DIAGNOSIS — Z4932 Encounter for adequacy testing for peritoneal dialysis: Secondary | ICD-10-CM | POA: Diagnosis not present

## 2017-11-24 DIAGNOSIS — N186 End stage renal disease: Secondary | ICD-10-CM | POA: Diagnosis not present

## 2017-11-24 DIAGNOSIS — R17 Unspecified jaundice: Secondary | ICD-10-CM | POA: Diagnosis not present

## 2017-11-25 DIAGNOSIS — N186 End stage renal disease: Secondary | ICD-10-CM | POA: Diagnosis not present

## 2017-11-25 DIAGNOSIS — Z79899 Other long term (current) drug therapy: Secondary | ICD-10-CM | POA: Diagnosis not present

## 2017-11-25 DIAGNOSIS — D631 Anemia in chronic kidney disease: Secondary | ICD-10-CM | POA: Diagnosis not present

## 2017-11-25 DIAGNOSIS — R17 Unspecified jaundice: Secondary | ICD-10-CM | POA: Diagnosis not present

## 2017-11-25 DIAGNOSIS — E440 Moderate protein-calorie malnutrition: Secondary | ICD-10-CM | POA: Diagnosis not present

## 2017-11-25 DIAGNOSIS — Z4932 Encounter for adequacy testing for peritoneal dialysis: Secondary | ICD-10-CM | POA: Diagnosis not present

## 2017-11-26 DIAGNOSIS — Z79899 Other long term (current) drug therapy: Secondary | ICD-10-CM | POA: Diagnosis not present

## 2017-11-26 DIAGNOSIS — D631 Anemia in chronic kidney disease: Secondary | ICD-10-CM | POA: Diagnosis not present

## 2017-11-26 DIAGNOSIS — N186 End stage renal disease: Secondary | ICD-10-CM | POA: Diagnosis not present

## 2017-11-26 DIAGNOSIS — E440 Moderate protein-calorie malnutrition: Secondary | ICD-10-CM | POA: Diagnosis not present

## 2017-11-26 DIAGNOSIS — Z4932 Encounter for adequacy testing for peritoneal dialysis: Secondary | ICD-10-CM | POA: Diagnosis not present

## 2017-11-26 DIAGNOSIS — R17 Unspecified jaundice: Secondary | ICD-10-CM | POA: Diagnosis not present

## 2017-11-27 DIAGNOSIS — D631 Anemia in chronic kidney disease: Secondary | ICD-10-CM | POA: Diagnosis not present

## 2017-11-27 DIAGNOSIS — R17 Unspecified jaundice: Secondary | ICD-10-CM | POA: Diagnosis not present

## 2017-11-27 DIAGNOSIS — Z4932 Encounter for adequacy testing for peritoneal dialysis: Secondary | ICD-10-CM | POA: Diagnosis not present

## 2017-11-27 DIAGNOSIS — N186 End stage renal disease: Secondary | ICD-10-CM | POA: Diagnosis not present

## 2017-11-27 DIAGNOSIS — Z79899 Other long term (current) drug therapy: Secondary | ICD-10-CM | POA: Diagnosis not present

## 2017-11-27 DIAGNOSIS — E440 Moderate protein-calorie malnutrition: Secondary | ICD-10-CM | POA: Diagnosis not present

## 2017-11-28 DIAGNOSIS — N186 End stage renal disease: Secondary | ICD-10-CM | POA: Diagnosis not present

## 2017-11-28 DIAGNOSIS — R17 Unspecified jaundice: Secondary | ICD-10-CM | POA: Diagnosis not present

## 2017-11-28 DIAGNOSIS — E440 Moderate protein-calorie malnutrition: Secondary | ICD-10-CM | POA: Diagnosis not present

## 2017-11-28 DIAGNOSIS — D631 Anemia in chronic kidney disease: Secondary | ICD-10-CM | POA: Diagnosis not present

## 2017-11-28 DIAGNOSIS — Z4932 Encounter for adequacy testing for peritoneal dialysis: Secondary | ICD-10-CM | POA: Diagnosis not present

## 2017-11-28 DIAGNOSIS — Z79899 Other long term (current) drug therapy: Secondary | ICD-10-CM | POA: Diagnosis not present

## 2017-11-29 DIAGNOSIS — Z79899 Other long term (current) drug therapy: Secondary | ICD-10-CM | POA: Diagnosis not present

## 2017-11-29 DIAGNOSIS — Z4932 Encounter for adequacy testing for peritoneal dialysis: Secondary | ICD-10-CM | POA: Diagnosis not present

## 2017-11-29 DIAGNOSIS — E440 Moderate protein-calorie malnutrition: Secondary | ICD-10-CM | POA: Diagnosis not present

## 2017-11-29 DIAGNOSIS — D631 Anemia in chronic kidney disease: Secondary | ICD-10-CM | POA: Diagnosis not present

## 2017-11-29 DIAGNOSIS — R17 Unspecified jaundice: Secondary | ICD-10-CM | POA: Diagnosis not present

## 2017-11-29 DIAGNOSIS — N186 End stage renal disease: Secondary | ICD-10-CM | POA: Diagnosis not present

## 2017-11-30 DIAGNOSIS — N186 End stage renal disease: Secondary | ICD-10-CM | POA: Diagnosis not present

## 2017-11-30 DIAGNOSIS — Z4932 Encounter for adequacy testing for peritoneal dialysis: Secondary | ICD-10-CM | POA: Diagnosis not present

## 2017-11-30 DIAGNOSIS — R17 Unspecified jaundice: Secondary | ICD-10-CM | POA: Diagnosis not present

## 2017-11-30 DIAGNOSIS — E440 Moderate protein-calorie malnutrition: Secondary | ICD-10-CM | POA: Diagnosis not present

## 2017-11-30 DIAGNOSIS — D631 Anemia in chronic kidney disease: Secondary | ICD-10-CM | POA: Diagnosis not present

## 2017-11-30 DIAGNOSIS — Z79899 Other long term (current) drug therapy: Secondary | ICD-10-CM | POA: Diagnosis not present

## 2017-12-01 DIAGNOSIS — Z4932 Encounter for adequacy testing for peritoneal dialysis: Secondary | ICD-10-CM | POA: Diagnosis not present

## 2017-12-01 DIAGNOSIS — E440 Moderate protein-calorie malnutrition: Secondary | ICD-10-CM | POA: Diagnosis not present

## 2017-12-01 DIAGNOSIS — R17 Unspecified jaundice: Secondary | ICD-10-CM | POA: Diagnosis not present

## 2017-12-01 DIAGNOSIS — N186 End stage renal disease: Secondary | ICD-10-CM | POA: Diagnosis not present

## 2017-12-01 DIAGNOSIS — Z79899 Other long term (current) drug therapy: Secondary | ICD-10-CM | POA: Diagnosis not present

## 2017-12-01 DIAGNOSIS — D631 Anemia in chronic kidney disease: Secondary | ICD-10-CM | POA: Diagnosis not present

## 2017-12-02 DIAGNOSIS — R17 Unspecified jaundice: Secondary | ICD-10-CM | POA: Diagnosis not present

## 2017-12-02 DIAGNOSIS — N186 End stage renal disease: Secondary | ICD-10-CM | POA: Diagnosis not present

## 2017-12-02 DIAGNOSIS — E440 Moderate protein-calorie malnutrition: Secondary | ICD-10-CM | POA: Diagnosis not present

## 2017-12-02 DIAGNOSIS — Z4932 Encounter for adequacy testing for peritoneal dialysis: Secondary | ICD-10-CM | POA: Diagnosis not present

## 2017-12-02 DIAGNOSIS — Z79899 Other long term (current) drug therapy: Secondary | ICD-10-CM | POA: Diagnosis not present

## 2017-12-02 DIAGNOSIS — D631 Anemia in chronic kidney disease: Secondary | ICD-10-CM | POA: Diagnosis not present

## 2017-12-03 DIAGNOSIS — Z79899 Other long term (current) drug therapy: Secondary | ICD-10-CM | POA: Diagnosis not present

## 2017-12-03 DIAGNOSIS — N186 End stage renal disease: Secondary | ICD-10-CM | POA: Diagnosis not present

## 2017-12-03 DIAGNOSIS — Z4932 Encounter for adequacy testing for peritoneal dialysis: Secondary | ICD-10-CM | POA: Diagnosis not present

## 2017-12-03 DIAGNOSIS — R17 Unspecified jaundice: Secondary | ICD-10-CM | POA: Diagnosis not present

## 2017-12-03 DIAGNOSIS — D631 Anemia in chronic kidney disease: Secondary | ICD-10-CM | POA: Diagnosis not present

## 2017-12-03 DIAGNOSIS — E440 Moderate protein-calorie malnutrition: Secondary | ICD-10-CM | POA: Diagnosis not present

## 2017-12-04 DIAGNOSIS — Z992 Dependence on renal dialysis: Secondary | ICD-10-CM | POA: Diagnosis not present

## 2017-12-04 DIAGNOSIS — R17 Unspecified jaundice: Secondary | ICD-10-CM | POA: Diagnosis not present

## 2017-12-04 DIAGNOSIS — Z4932 Encounter for adequacy testing for peritoneal dialysis: Secondary | ICD-10-CM | POA: Diagnosis not present

## 2017-12-04 DIAGNOSIS — Z79899 Other long term (current) drug therapy: Secondary | ICD-10-CM | POA: Diagnosis not present

## 2017-12-04 DIAGNOSIS — D631 Anemia in chronic kidney disease: Secondary | ICD-10-CM | POA: Diagnosis not present

## 2017-12-04 DIAGNOSIS — D509 Iron deficiency anemia, unspecified: Secondary | ICD-10-CM | POA: Diagnosis not present

## 2017-12-04 DIAGNOSIS — N186 End stage renal disease: Secondary | ICD-10-CM | POA: Diagnosis not present

## 2017-12-04 DIAGNOSIS — E440 Moderate protein-calorie malnutrition: Secondary | ICD-10-CM | POA: Diagnosis not present

## 2017-12-04 DIAGNOSIS — M311 Thrombotic microangiopathy: Secondary | ICD-10-CM | POA: Diagnosis not present

## 2017-12-04 DIAGNOSIS — N2581 Secondary hyperparathyroidism of renal origin: Secondary | ICD-10-CM | POA: Diagnosis not present

## 2017-12-05 DIAGNOSIS — E440 Moderate protein-calorie malnutrition: Secondary | ICD-10-CM | POA: Diagnosis not present

## 2017-12-05 DIAGNOSIS — D631 Anemia in chronic kidney disease: Secondary | ICD-10-CM | POA: Diagnosis not present

## 2017-12-05 DIAGNOSIS — N2581 Secondary hyperparathyroidism of renal origin: Secondary | ICD-10-CM | POA: Diagnosis not present

## 2017-12-05 DIAGNOSIS — Z79899 Other long term (current) drug therapy: Secondary | ICD-10-CM | POA: Diagnosis not present

## 2017-12-05 DIAGNOSIS — N186 End stage renal disease: Secondary | ICD-10-CM | POA: Diagnosis not present

## 2017-12-05 DIAGNOSIS — Z4932 Encounter for adequacy testing for peritoneal dialysis: Secondary | ICD-10-CM | POA: Diagnosis not present

## 2017-12-06 DIAGNOSIS — D631 Anemia in chronic kidney disease: Secondary | ICD-10-CM | POA: Diagnosis not present

## 2017-12-06 DIAGNOSIS — N2581 Secondary hyperparathyroidism of renal origin: Secondary | ICD-10-CM | POA: Diagnosis not present

## 2017-12-06 DIAGNOSIS — N186 End stage renal disease: Secondary | ICD-10-CM | POA: Diagnosis not present

## 2017-12-06 DIAGNOSIS — Z4932 Encounter for adequacy testing for peritoneal dialysis: Secondary | ICD-10-CM | POA: Diagnosis not present

## 2017-12-06 DIAGNOSIS — E440 Moderate protein-calorie malnutrition: Secondary | ICD-10-CM | POA: Diagnosis not present

## 2017-12-06 DIAGNOSIS — Z79899 Other long term (current) drug therapy: Secondary | ICD-10-CM | POA: Diagnosis not present

## 2017-12-07 DIAGNOSIS — N2581 Secondary hyperparathyroidism of renal origin: Secondary | ICD-10-CM | POA: Diagnosis not present

## 2017-12-07 DIAGNOSIS — N186 End stage renal disease: Secondary | ICD-10-CM | POA: Diagnosis not present

## 2017-12-07 DIAGNOSIS — Z79899 Other long term (current) drug therapy: Secondary | ICD-10-CM | POA: Diagnosis not present

## 2017-12-07 DIAGNOSIS — E440 Moderate protein-calorie malnutrition: Secondary | ICD-10-CM | POA: Diagnosis not present

## 2017-12-07 DIAGNOSIS — Z4932 Encounter for adequacy testing for peritoneal dialysis: Secondary | ICD-10-CM | POA: Diagnosis not present

## 2017-12-07 DIAGNOSIS — D631 Anemia in chronic kidney disease: Secondary | ICD-10-CM | POA: Diagnosis not present

## 2017-12-08 DIAGNOSIS — Z79899 Other long term (current) drug therapy: Secondary | ICD-10-CM | POA: Diagnosis not present

## 2017-12-08 DIAGNOSIS — E440 Moderate protein-calorie malnutrition: Secondary | ICD-10-CM | POA: Diagnosis not present

## 2017-12-08 DIAGNOSIS — Z4932 Encounter for adequacy testing for peritoneal dialysis: Secondary | ICD-10-CM | POA: Diagnosis not present

## 2017-12-08 DIAGNOSIS — D631 Anemia in chronic kidney disease: Secondary | ICD-10-CM | POA: Diagnosis not present

## 2017-12-08 DIAGNOSIS — N2581 Secondary hyperparathyroidism of renal origin: Secondary | ICD-10-CM | POA: Diagnosis not present

## 2017-12-08 DIAGNOSIS — N186 End stage renal disease: Secondary | ICD-10-CM | POA: Diagnosis not present

## 2017-12-09 DIAGNOSIS — D631 Anemia in chronic kidney disease: Secondary | ICD-10-CM | POA: Diagnosis not present

## 2017-12-09 DIAGNOSIS — E440 Moderate protein-calorie malnutrition: Secondary | ICD-10-CM | POA: Diagnosis not present

## 2017-12-09 DIAGNOSIS — N186 End stage renal disease: Secondary | ICD-10-CM | POA: Diagnosis not present

## 2017-12-09 DIAGNOSIS — Z4932 Encounter for adequacy testing for peritoneal dialysis: Secondary | ICD-10-CM | POA: Diagnosis not present

## 2017-12-09 DIAGNOSIS — N2581 Secondary hyperparathyroidism of renal origin: Secondary | ICD-10-CM | POA: Diagnosis not present

## 2017-12-09 DIAGNOSIS — Z79899 Other long term (current) drug therapy: Secondary | ICD-10-CM | POA: Diagnosis not present

## 2017-12-10 DIAGNOSIS — N2581 Secondary hyperparathyroidism of renal origin: Secondary | ICD-10-CM | POA: Diagnosis not present

## 2017-12-10 DIAGNOSIS — E440 Moderate protein-calorie malnutrition: Secondary | ICD-10-CM | POA: Diagnosis not present

## 2017-12-10 DIAGNOSIS — D631 Anemia in chronic kidney disease: Secondary | ICD-10-CM | POA: Diagnosis not present

## 2017-12-10 DIAGNOSIS — Z79899 Other long term (current) drug therapy: Secondary | ICD-10-CM | POA: Diagnosis not present

## 2017-12-10 DIAGNOSIS — N186 End stage renal disease: Secondary | ICD-10-CM | POA: Diagnosis not present

## 2017-12-10 DIAGNOSIS — Z4932 Encounter for adequacy testing for peritoneal dialysis: Secondary | ICD-10-CM | POA: Diagnosis not present

## 2017-12-11 DIAGNOSIS — Z79899 Other long term (current) drug therapy: Secondary | ICD-10-CM | POA: Diagnosis not present

## 2017-12-11 DIAGNOSIS — N186 End stage renal disease: Secondary | ICD-10-CM | POA: Diagnosis not present

## 2017-12-11 DIAGNOSIS — Z4932 Encounter for adequacy testing for peritoneal dialysis: Secondary | ICD-10-CM | POA: Diagnosis not present

## 2017-12-11 DIAGNOSIS — D631 Anemia in chronic kidney disease: Secondary | ICD-10-CM | POA: Diagnosis not present

## 2017-12-11 DIAGNOSIS — N2581 Secondary hyperparathyroidism of renal origin: Secondary | ICD-10-CM | POA: Diagnosis not present

## 2017-12-11 DIAGNOSIS — E440 Moderate protein-calorie malnutrition: Secondary | ICD-10-CM | POA: Diagnosis not present

## 2017-12-12 DIAGNOSIS — Z79899 Other long term (current) drug therapy: Secondary | ICD-10-CM | POA: Diagnosis not present

## 2017-12-12 DIAGNOSIS — N2581 Secondary hyperparathyroidism of renal origin: Secondary | ICD-10-CM | POA: Diagnosis not present

## 2017-12-12 DIAGNOSIS — D631 Anemia in chronic kidney disease: Secondary | ICD-10-CM | POA: Diagnosis not present

## 2017-12-12 DIAGNOSIS — N186 End stage renal disease: Secondary | ICD-10-CM | POA: Diagnosis not present

## 2017-12-12 DIAGNOSIS — Z4932 Encounter for adequacy testing for peritoneal dialysis: Secondary | ICD-10-CM | POA: Diagnosis not present

## 2017-12-12 DIAGNOSIS — E440 Moderate protein-calorie malnutrition: Secondary | ICD-10-CM | POA: Diagnosis not present

## 2017-12-13 DIAGNOSIS — N186 End stage renal disease: Secondary | ICD-10-CM | POA: Diagnosis not present

## 2017-12-13 DIAGNOSIS — Z4932 Encounter for adequacy testing for peritoneal dialysis: Secondary | ICD-10-CM | POA: Diagnosis not present

## 2017-12-13 DIAGNOSIS — Z79899 Other long term (current) drug therapy: Secondary | ICD-10-CM | POA: Diagnosis not present

## 2017-12-13 DIAGNOSIS — D631 Anemia in chronic kidney disease: Secondary | ICD-10-CM | POA: Diagnosis not present

## 2017-12-13 DIAGNOSIS — N2581 Secondary hyperparathyroidism of renal origin: Secondary | ICD-10-CM | POA: Diagnosis not present

## 2017-12-13 DIAGNOSIS — E440 Moderate protein-calorie malnutrition: Secondary | ICD-10-CM | POA: Diagnosis not present

## 2017-12-14 ENCOUNTER — Ambulatory Visit (INDEPENDENT_AMBULATORY_CARE_PROVIDER_SITE_OTHER): Payer: Medicare Other | Admitting: Vascular Surgery

## 2017-12-14 ENCOUNTER — Encounter: Payer: Self-pay | Admitting: Vascular Surgery

## 2017-12-14 VITALS — BP 122/81 | HR 80 | Temp 97.1°F | Resp 16 | Ht 64.0 in | Wt 108.0 lb

## 2017-12-14 DIAGNOSIS — T82898A Other specified complication of vascular prosthetic devices, implants and grafts, initial encounter: Secondary | ICD-10-CM

## 2017-12-14 DIAGNOSIS — E440 Moderate protein-calorie malnutrition: Secondary | ICD-10-CM | POA: Diagnosis not present

## 2017-12-14 DIAGNOSIS — Z4932 Encounter for adequacy testing for peritoneal dialysis: Secondary | ICD-10-CM | POA: Diagnosis not present

## 2017-12-14 DIAGNOSIS — Z48812 Encounter for surgical aftercare following surgery on the circulatory system: Secondary | ICD-10-CM

## 2017-12-14 DIAGNOSIS — Z79899 Other long term (current) drug therapy: Secondary | ICD-10-CM | POA: Diagnosis not present

## 2017-12-14 DIAGNOSIS — D631 Anemia in chronic kidney disease: Secondary | ICD-10-CM | POA: Diagnosis not present

## 2017-12-14 DIAGNOSIS — N2581 Secondary hyperparathyroidism of renal origin: Secondary | ICD-10-CM | POA: Diagnosis not present

## 2017-12-14 DIAGNOSIS — N186 End stage renal disease: Secondary | ICD-10-CM | POA: Diagnosis not present

## 2017-12-14 NOTE — Progress Notes (Signed)
   Patient name: Erin Morgan MRN: [id number] DOB: 16-Sep-1955 Sex: female  REASON FOR VISIT:   Follow-up after plication of the left upper arm AV fistula  HPI:   Erin Morgan is a pleasant 62 y.o. 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  She had been evaluated in the office and set up for plication.  Of note she is on peritoneal dialysis.  On 33/00/7622 she underwent plication of the aneurysm in her left upper arm fistula.  She comes in for an outpatient visit.  She has no specific complaints.  She continues with peritoneal dialysis.  Current Outpatient Medications  Medication Sig Dispense Refill  . acetaminophen (TYLENOL) 500 MG tablet Take 1,000 mg by mouth every 8 (eight) hours as needed for headache.    . ferric citrate (AURYXIA) 1 GM 210 MG(Fe) tablet Take 420 mg by mouth See admin instructions. Take 2 tablets (420 mg) by mouth with each meal & 2 tablet (210 mg) by mouth with each protein snack    . fluocinonide (LIDEX) 0.05 % external solution Apply 1 application topically at bedtime. MIX WITH CETAPHIL    . gentamicin cream (GARAMYCIN) 0.1 % Apply 1 application topically daily. For dialysis catheter    . hydrALAZINE (APRESOLINE) 100 MG tablet Take 100 mg by mouth 3 (three) times daily.     . hydroxychloroquine (PLAQUENIL) 200 MG tablet Take 200 mg by mouth 2 (two) times daily.    Marland Kitchen labetalol (NORMODYNE) 200 MG tablet Take 200 mg by mouth 2 (two) times daily.     Marland Kitchen omeprazole (PRILOSEC) 20 MG capsule Take 20 mg by mouth 2 (two) times daily.     Marland Kitchen oxyCODONE (ROXICODONE) 5 MG immediate release tablet Take 1 tablet (5 mg total) by mouth every 4 (four) hours as needed. 15 tablet 0  . SOLIRIS 300 MG/30ML SOLN injection Inject 300 mg into the vein every 14 (fourteen) days.      No current facility-administered medications for this visit.     REVIEW OF SYSTEMS:  [X]  denotes positive finding, [ ]  denotes negative finding Vascular      Leg swelling    Cardiac    Chest pain or chest pressure:    Shortness of breath upon exertion:    Short of breath when lying flat:    Irregular heart rhythm:    Constitutional    Fever or chills:     PHYSICAL EXAM:   Vitals:   12/14/17 1131  BP: 122/81  Pulse: 80  Resp: 16  Temp: (!) 97.1 F (36.2 C)  SpO2: 98%  Weight: 108 lb (49 kg)  Height: 5\' 4"  (1.626 m)    GENERAL: The patient is a well-nourished female, in no acute distress. The vital signs are documented above. CARDIOVASCULAR: There is a regular rate and rhythm. PULMONARY: There is good air exchange bilaterally without wheezing or rales. Her fistula has an excellent thrill.  The incision is healing nicely.  DATA:   No new data.  MEDICAL ISSUES:   STATUS POST PLICATION OF LEFT UPPER ARM FISTULA: Patient is doing well status post plication of her left upper arm fistula.  The fistula has an excellent thrill and she has a palpable left radial pulse.  She will continue with peritoneal dialysis.  This will be her backup.  I will see her back as needed.  [name] [facility] [phone]

## 2017-12-15 DIAGNOSIS — E440 Moderate protein-calorie malnutrition: Secondary | ICD-10-CM | POA: Diagnosis not present

## 2017-12-15 DIAGNOSIS — Z4932 Encounter for adequacy testing for peritoneal dialysis: Secondary | ICD-10-CM | POA: Diagnosis not present

## 2017-12-15 DIAGNOSIS — Z79899 Other long term (current) drug therapy: Secondary | ICD-10-CM | POA: Diagnosis not present

## 2017-12-15 DIAGNOSIS — N186 End stage renal disease: Secondary | ICD-10-CM | POA: Diagnosis not present

## 2017-12-15 DIAGNOSIS — N2581 Secondary hyperparathyroidism of renal origin: Secondary | ICD-10-CM | POA: Diagnosis not present

## 2017-12-15 DIAGNOSIS — D631 Anemia in chronic kidney disease: Secondary | ICD-10-CM | POA: Diagnosis not present

## 2017-12-16 DIAGNOSIS — D631 Anemia in chronic kidney disease: Secondary | ICD-10-CM | POA: Diagnosis not present

## 2017-12-16 DIAGNOSIS — N186 End stage renal disease: Secondary | ICD-10-CM | POA: Diagnosis not present

## 2017-12-16 DIAGNOSIS — Z79899 Other long term (current) drug therapy: Secondary | ICD-10-CM | POA: Diagnosis not present

## 2017-12-16 DIAGNOSIS — E440 Moderate protein-calorie malnutrition: Secondary | ICD-10-CM | POA: Diagnosis not present

## 2017-12-16 DIAGNOSIS — N2581 Secondary hyperparathyroidism of renal origin: Secondary | ICD-10-CM | POA: Diagnosis not present

## 2017-12-16 DIAGNOSIS — Z4932 Encounter for adequacy testing for peritoneal dialysis: Secondary | ICD-10-CM | POA: Diagnosis not present

## 2017-12-17 DIAGNOSIS — Z4932 Encounter for adequacy testing for peritoneal dialysis: Secondary | ICD-10-CM | POA: Diagnosis not present

## 2017-12-17 DIAGNOSIS — D631 Anemia in chronic kidney disease: Secondary | ICD-10-CM | POA: Diagnosis not present

## 2017-12-17 DIAGNOSIS — Z79899 Other long term (current) drug therapy: Secondary | ICD-10-CM | POA: Diagnosis not present

## 2017-12-17 DIAGNOSIS — N2581 Secondary hyperparathyroidism of renal origin: Secondary | ICD-10-CM | POA: Diagnosis not present

## 2017-12-17 DIAGNOSIS — E440 Moderate protein-calorie malnutrition: Secondary | ICD-10-CM | POA: Diagnosis not present

## 2017-12-17 DIAGNOSIS — N186 End stage renal disease: Secondary | ICD-10-CM | POA: Diagnosis not present

## 2017-12-18 DIAGNOSIS — N2581 Secondary hyperparathyroidism of renal origin: Secondary | ICD-10-CM | POA: Diagnosis not present

## 2017-12-18 DIAGNOSIS — E440 Moderate protein-calorie malnutrition: Secondary | ICD-10-CM | POA: Diagnosis not present

## 2017-12-18 DIAGNOSIS — Z79899 Other long term (current) drug therapy: Secondary | ICD-10-CM | POA: Diagnosis not present

## 2017-12-18 DIAGNOSIS — Z4932 Encounter for adequacy testing for peritoneal dialysis: Secondary | ICD-10-CM | POA: Diagnosis not present

## 2017-12-18 DIAGNOSIS — N186 End stage renal disease: Secondary | ICD-10-CM | POA: Diagnosis not present

## 2017-12-18 DIAGNOSIS — D631 Anemia in chronic kidney disease: Secondary | ICD-10-CM | POA: Diagnosis not present

## 2017-12-19 DIAGNOSIS — E440 Moderate protein-calorie malnutrition: Secondary | ICD-10-CM | POA: Diagnosis not present

## 2017-12-19 DIAGNOSIS — Z79899 Other long term (current) drug therapy: Secondary | ICD-10-CM | POA: Diagnosis not present

## 2017-12-19 DIAGNOSIS — D631 Anemia in chronic kidney disease: Secondary | ICD-10-CM | POA: Diagnosis not present

## 2017-12-19 DIAGNOSIS — N2581 Secondary hyperparathyroidism of renal origin: Secondary | ICD-10-CM | POA: Diagnosis not present

## 2017-12-19 DIAGNOSIS — Z4932 Encounter for adequacy testing for peritoneal dialysis: Secondary | ICD-10-CM | POA: Diagnosis not present

## 2017-12-19 DIAGNOSIS — N186 End stage renal disease: Secondary | ICD-10-CM | POA: Diagnosis not present

## 2017-12-20 DIAGNOSIS — Z79899 Other long term (current) drug therapy: Secondary | ICD-10-CM | POA: Diagnosis not present

## 2017-12-20 DIAGNOSIS — N186 End stage renal disease: Secondary | ICD-10-CM | POA: Diagnosis not present

## 2017-12-20 DIAGNOSIS — N2581 Secondary hyperparathyroidism of renal origin: Secondary | ICD-10-CM | POA: Diagnosis not present

## 2017-12-20 DIAGNOSIS — D631 Anemia in chronic kidney disease: Secondary | ICD-10-CM | POA: Diagnosis not present

## 2017-12-20 DIAGNOSIS — E440 Moderate protein-calorie malnutrition: Secondary | ICD-10-CM | POA: Diagnosis not present

## 2017-12-20 DIAGNOSIS — Z4932 Encounter for adequacy testing for peritoneal dialysis: Secondary | ICD-10-CM | POA: Diagnosis not present

## 2017-12-21 DIAGNOSIS — Z79899 Other long term (current) drug therapy: Secondary | ICD-10-CM | POA: Diagnosis not present

## 2017-12-21 DIAGNOSIS — N2581 Secondary hyperparathyroidism of renal origin: Secondary | ICD-10-CM | POA: Diagnosis not present

## 2017-12-21 DIAGNOSIS — D631 Anemia in chronic kidney disease: Secondary | ICD-10-CM | POA: Diagnosis not present

## 2017-12-21 DIAGNOSIS — Z4932 Encounter for adequacy testing for peritoneal dialysis: Secondary | ICD-10-CM | POA: Diagnosis not present

## 2017-12-21 DIAGNOSIS — N186 End stage renal disease: Secondary | ICD-10-CM | POA: Diagnosis not present

## 2017-12-21 DIAGNOSIS — E440 Moderate protein-calorie malnutrition: Secondary | ICD-10-CM | POA: Diagnosis not present

## 2017-12-22 DIAGNOSIS — E440 Moderate protein-calorie malnutrition: Secondary | ICD-10-CM | POA: Diagnosis not present

## 2017-12-22 DIAGNOSIS — Z4932 Encounter for adequacy testing for peritoneal dialysis: Secondary | ICD-10-CM | POA: Diagnosis not present

## 2017-12-22 DIAGNOSIS — Z79899 Other long term (current) drug therapy: Secondary | ICD-10-CM | POA: Diagnosis not present

## 2017-12-22 DIAGNOSIS — N2581 Secondary hyperparathyroidism of renal origin: Secondary | ICD-10-CM | POA: Diagnosis not present

## 2017-12-22 DIAGNOSIS — D631 Anemia in chronic kidney disease: Secondary | ICD-10-CM | POA: Diagnosis not present

## 2017-12-22 DIAGNOSIS — N186 End stage renal disease: Secondary | ICD-10-CM | POA: Diagnosis not present

## 2017-12-23 DIAGNOSIS — Z4932 Encounter for adequacy testing for peritoneal dialysis: Secondary | ICD-10-CM | POA: Diagnosis not present

## 2017-12-23 DIAGNOSIS — E440 Moderate protein-calorie malnutrition: Secondary | ICD-10-CM | POA: Diagnosis not present

## 2017-12-23 DIAGNOSIS — Z79899 Other long term (current) drug therapy: Secondary | ICD-10-CM | POA: Diagnosis not present

## 2017-12-23 DIAGNOSIS — N186 End stage renal disease: Secondary | ICD-10-CM | POA: Diagnosis not present

## 2017-12-23 DIAGNOSIS — N2581 Secondary hyperparathyroidism of renal origin: Secondary | ICD-10-CM | POA: Diagnosis not present

## 2017-12-23 DIAGNOSIS — D631 Anemia in chronic kidney disease: Secondary | ICD-10-CM | POA: Diagnosis not present

## 2017-12-24 DIAGNOSIS — D631 Anemia in chronic kidney disease: Secondary | ICD-10-CM | POA: Diagnosis not present

## 2017-12-24 DIAGNOSIS — N2581 Secondary hyperparathyroidism of renal origin: Secondary | ICD-10-CM | POA: Diagnosis not present

## 2017-12-24 DIAGNOSIS — Z4932 Encounter for adequacy testing for peritoneal dialysis: Secondary | ICD-10-CM | POA: Diagnosis not present

## 2017-12-24 DIAGNOSIS — E440 Moderate protein-calorie malnutrition: Secondary | ICD-10-CM | POA: Diagnosis not present

## 2017-12-24 DIAGNOSIS — Z79899 Other long term (current) drug therapy: Secondary | ICD-10-CM | POA: Diagnosis not present

## 2017-12-24 DIAGNOSIS — N186 End stage renal disease: Secondary | ICD-10-CM | POA: Diagnosis not present

## 2017-12-25 DIAGNOSIS — Z4932 Encounter for adequacy testing for peritoneal dialysis: Secondary | ICD-10-CM | POA: Diagnosis not present

## 2017-12-25 DIAGNOSIS — N186 End stage renal disease: Secondary | ICD-10-CM | POA: Diagnosis not present

## 2017-12-25 DIAGNOSIS — E440 Moderate protein-calorie malnutrition: Secondary | ICD-10-CM | POA: Diagnosis not present

## 2017-12-25 DIAGNOSIS — Z79899 Other long term (current) drug therapy: Secondary | ICD-10-CM | POA: Diagnosis not present

## 2017-12-25 DIAGNOSIS — D631 Anemia in chronic kidney disease: Secondary | ICD-10-CM | POA: Diagnosis not present

## 2017-12-25 DIAGNOSIS — N2581 Secondary hyperparathyroidism of renal origin: Secondary | ICD-10-CM | POA: Diagnosis not present

## 2017-12-26 DIAGNOSIS — N186 End stage renal disease: Secondary | ICD-10-CM | POA: Diagnosis not present

## 2017-12-26 DIAGNOSIS — Z4932 Encounter for adequacy testing for peritoneal dialysis: Secondary | ICD-10-CM | POA: Diagnosis not present

## 2017-12-26 DIAGNOSIS — N2581 Secondary hyperparathyroidism of renal origin: Secondary | ICD-10-CM | POA: Diagnosis not present

## 2017-12-26 DIAGNOSIS — E440 Moderate protein-calorie malnutrition: Secondary | ICD-10-CM | POA: Diagnosis not present

## 2017-12-26 DIAGNOSIS — D631 Anemia in chronic kidney disease: Secondary | ICD-10-CM | POA: Diagnosis not present

## 2017-12-26 DIAGNOSIS — Z79899 Other long term (current) drug therapy: Secondary | ICD-10-CM | POA: Diagnosis not present

## 2017-12-27 DIAGNOSIS — Z4932 Encounter for adequacy testing for peritoneal dialysis: Secondary | ICD-10-CM | POA: Diagnosis not present

## 2017-12-27 DIAGNOSIS — Z79899 Other long term (current) drug therapy: Secondary | ICD-10-CM | POA: Diagnosis not present

## 2017-12-27 DIAGNOSIS — D631 Anemia in chronic kidney disease: Secondary | ICD-10-CM | POA: Diagnosis not present

## 2017-12-27 DIAGNOSIS — N186 End stage renal disease: Secondary | ICD-10-CM | POA: Diagnosis not present

## 2017-12-27 DIAGNOSIS — N2581 Secondary hyperparathyroidism of renal origin: Secondary | ICD-10-CM | POA: Diagnosis not present

## 2017-12-27 DIAGNOSIS — E440 Moderate protein-calorie malnutrition: Secondary | ICD-10-CM | POA: Diagnosis not present

## 2017-12-28 DIAGNOSIS — Z79899 Other long term (current) drug therapy: Secondary | ICD-10-CM | POA: Diagnosis not present

## 2017-12-28 DIAGNOSIS — N2581 Secondary hyperparathyroidism of renal origin: Secondary | ICD-10-CM | POA: Diagnosis not present

## 2017-12-28 DIAGNOSIS — E440 Moderate protein-calorie malnutrition: Secondary | ICD-10-CM | POA: Diagnosis not present

## 2017-12-28 DIAGNOSIS — D631 Anemia in chronic kidney disease: Secondary | ICD-10-CM | POA: Diagnosis not present

## 2017-12-28 DIAGNOSIS — Z4932 Encounter for adequacy testing for peritoneal dialysis: Secondary | ICD-10-CM | POA: Diagnosis not present

## 2017-12-28 DIAGNOSIS — N186 End stage renal disease: Secondary | ICD-10-CM | POA: Diagnosis not present

## 2017-12-29 DIAGNOSIS — D631 Anemia in chronic kidney disease: Secondary | ICD-10-CM | POA: Diagnosis not present

## 2017-12-29 DIAGNOSIS — Z4932 Encounter for adequacy testing for peritoneal dialysis: Secondary | ICD-10-CM | POA: Diagnosis not present

## 2017-12-29 DIAGNOSIS — E440 Moderate protein-calorie malnutrition: Secondary | ICD-10-CM | POA: Diagnosis not present

## 2017-12-29 DIAGNOSIS — N2581 Secondary hyperparathyroidism of renal origin: Secondary | ICD-10-CM | POA: Diagnosis not present

## 2017-12-29 DIAGNOSIS — N186 End stage renal disease: Secondary | ICD-10-CM | POA: Diagnosis not present

## 2017-12-29 DIAGNOSIS — Z79899 Other long term (current) drug therapy: Secondary | ICD-10-CM | POA: Diagnosis not present

## 2017-12-30 DIAGNOSIS — N186 End stage renal disease: Secondary | ICD-10-CM | POA: Diagnosis not present

## 2017-12-30 DIAGNOSIS — D631 Anemia in chronic kidney disease: Secondary | ICD-10-CM | POA: Diagnosis not present

## 2017-12-30 DIAGNOSIS — Z4932 Encounter for adequacy testing for peritoneal dialysis: Secondary | ICD-10-CM | POA: Diagnosis not present

## 2017-12-30 DIAGNOSIS — N2581 Secondary hyperparathyroidism of renal origin: Secondary | ICD-10-CM | POA: Diagnosis not present

## 2017-12-30 DIAGNOSIS — E440 Moderate protein-calorie malnutrition: Secondary | ICD-10-CM | POA: Diagnosis not present

## 2017-12-30 DIAGNOSIS — Z79899 Other long term (current) drug therapy: Secondary | ICD-10-CM | POA: Diagnosis not present

## 2017-12-31 DIAGNOSIS — Z4932 Encounter for adequacy testing for peritoneal dialysis: Secondary | ICD-10-CM | POA: Diagnosis not present

## 2017-12-31 DIAGNOSIS — E440 Moderate protein-calorie malnutrition: Secondary | ICD-10-CM | POA: Diagnosis not present

## 2017-12-31 DIAGNOSIS — D631 Anemia in chronic kidney disease: Secondary | ICD-10-CM | POA: Diagnosis not present

## 2017-12-31 DIAGNOSIS — N186 End stage renal disease: Secondary | ICD-10-CM | POA: Diagnosis not present

## 2017-12-31 DIAGNOSIS — Z79899 Other long term (current) drug therapy: Secondary | ICD-10-CM | POA: Diagnosis not present

## 2017-12-31 DIAGNOSIS — N2581 Secondary hyperparathyroidism of renal origin: Secondary | ICD-10-CM | POA: Diagnosis not present

## 2018-01-01 DIAGNOSIS — E440 Moderate protein-calorie malnutrition: Secondary | ICD-10-CM | POA: Diagnosis not present

## 2018-01-01 DIAGNOSIS — N186 End stage renal disease: Secondary | ICD-10-CM | POA: Diagnosis not present

## 2018-01-01 DIAGNOSIS — Z79899 Other long term (current) drug therapy: Secondary | ICD-10-CM | POA: Diagnosis not present

## 2018-01-01 DIAGNOSIS — Z4932 Encounter for adequacy testing for peritoneal dialysis: Secondary | ICD-10-CM | POA: Diagnosis not present

## 2018-01-01 DIAGNOSIS — D631 Anemia in chronic kidney disease: Secondary | ICD-10-CM | POA: Diagnosis not present

## 2018-01-01 DIAGNOSIS — N2581 Secondary hyperparathyroidism of renal origin: Secondary | ICD-10-CM | POA: Diagnosis not present

## 2018-01-02 DIAGNOSIS — D631 Anemia in chronic kidney disease: Secondary | ICD-10-CM | POA: Diagnosis not present

## 2018-01-02 DIAGNOSIS — N2581 Secondary hyperparathyroidism of renal origin: Secondary | ICD-10-CM | POA: Diagnosis not present

## 2018-01-02 DIAGNOSIS — Z79899 Other long term (current) drug therapy: Secondary | ICD-10-CM | POA: Diagnosis not present

## 2018-01-02 DIAGNOSIS — N186 End stage renal disease: Secondary | ICD-10-CM | POA: Diagnosis not present

## 2018-01-02 DIAGNOSIS — Z4932 Encounter for adequacy testing for peritoneal dialysis: Secondary | ICD-10-CM | POA: Diagnosis not present

## 2018-01-02 DIAGNOSIS — E440 Moderate protein-calorie malnutrition: Secondary | ICD-10-CM | POA: Diagnosis not present

## 2018-01-03 DIAGNOSIS — Z4932 Encounter for adequacy testing for peritoneal dialysis: Secondary | ICD-10-CM | POA: Diagnosis not present

## 2018-01-03 DIAGNOSIS — N2581 Secondary hyperparathyroidism of renal origin: Secondary | ICD-10-CM | POA: Diagnosis not present

## 2018-01-03 DIAGNOSIS — D631 Anemia in chronic kidney disease: Secondary | ICD-10-CM | POA: Diagnosis not present

## 2018-01-03 DIAGNOSIS — E440 Moderate protein-calorie malnutrition: Secondary | ICD-10-CM | POA: Diagnosis not present

## 2018-01-03 DIAGNOSIS — N186 End stage renal disease: Secondary | ICD-10-CM | POA: Diagnosis not present

## 2018-01-03 DIAGNOSIS — Z79899 Other long term (current) drug therapy: Secondary | ICD-10-CM | POA: Diagnosis not present

## 2018-01-04 DIAGNOSIS — Z4932 Encounter for adequacy testing for peritoneal dialysis: Secondary | ICD-10-CM | POA: Diagnosis not present

## 2018-01-04 DIAGNOSIS — D509 Iron deficiency anemia, unspecified: Secondary | ICD-10-CM | POA: Diagnosis not present

## 2018-01-04 DIAGNOSIS — E440 Moderate protein-calorie malnutrition: Secondary | ICD-10-CM | POA: Diagnosis not present

## 2018-01-04 DIAGNOSIS — N2581 Secondary hyperparathyroidism of renal origin: Secondary | ICD-10-CM | POA: Diagnosis not present

## 2018-01-04 DIAGNOSIS — D513 Other dietary vitamin B12 deficiency anemia: Secondary | ICD-10-CM | POA: Diagnosis not present

## 2018-01-04 DIAGNOSIS — N2589 Other disorders resulting from impaired renal tubular function: Secondary | ICD-10-CM | POA: Diagnosis not present

## 2018-01-04 DIAGNOSIS — N186 End stage renal disease: Secondary | ICD-10-CM | POA: Diagnosis not present

## 2018-01-04 DIAGNOSIS — R17 Unspecified jaundice: Secondary | ICD-10-CM | POA: Diagnosis not present

## 2018-01-05 DIAGNOSIS — Z4932 Encounter for adequacy testing for peritoneal dialysis: Secondary | ICD-10-CM | POA: Diagnosis not present

## 2018-01-05 DIAGNOSIS — N2589 Other disorders resulting from impaired renal tubular function: Secondary | ICD-10-CM | POA: Diagnosis not present

## 2018-01-05 DIAGNOSIS — D513 Other dietary vitamin B12 deficiency anemia: Secondary | ICD-10-CM | POA: Diagnosis not present

## 2018-01-05 DIAGNOSIS — R17 Unspecified jaundice: Secondary | ICD-10-CM | POA: Diagnosis not present

## 2018-01-05 DIAGNOSIS — D509 Iron deficiency anemia, unspecified: Secondary | ICD-10-CM | POA: Diagnosis not present

## 2018-01-05 DIAGNOSIS — N186 End stage renal disease: Secondary | ICD-10-CM | POA: Diagnosis not present

## 2018-01-06 DIAGNOSIS — R17 Unspecified jaundice: Secondary | ICD-10-CM | POA: Diagnosis not present

## 2018-01-06 DIAGNOSIS — N186 End stage renal disease: Secondary | ICD-10-CM | POA: Diagnosis not present

## 2018-01-06 DIAGNOSIS — Z4932 Encounter for adequacy testing for peritoneal dialysis: Secondary | ICD-10-CM | POA: Diagnosis not present

## 2018-01-06 DIAGNOSIS — N2589 Other disorders resulting from impaired renal tubular function: Secondary | ICD-10-CM | POA: Diagnosis not present

## 2018-01-06 DIAGNOSIS — D513 Other dietary vitamin B12 deficiency anemia: Secondary | ICD-10-CM | POA: Diagnosis not present

## 2018-01-06 DIAGNOSIS — D509 Iron deficiency anemia, unspecified: Secondary | ICD-10-CM | POA: Diagnosis not present

## 2018-01-07 DIAGNOSIS — D513 Other dietary vitamin B12 deficiency anemia: Secondary | ICD-10-CM | POA: Diagnosis not present

## 2018-01-07 DIAGNOSIS — Z4932 Encounter for adequacy testing for peritoneal dialysis: Secondary | ICD-10-CM | POA: Diagnosis not present

## 2018-01-07 DIAGNOSIS — D509 Iron deficiency anemia, unspecified: Secondary | ICD-10-CM | POA: Diagnosis not present

## 2018-01-07 DIAGNOSIS — R17 Unspecified jaundice: Secondary | ICD-10-CM | POA: Diagnosis not present

## 2018-01-07 DIAGNOSIS — N186 End stage renal disease: Secondary | ICD-10-CM | POA: Diagnosis not present

## 2018-01-07 DIAGNOSIS — N2589 Other disorders resulting from impaired renal tubular function: Secondary | ICD-10-CM | POA: Diagnosis not present

## 2018-01-08 DIAGNOSIS — D509 Iron deficiency anemia, unspecified: Secondary | ICD-10-CM | POA: Diagnosis not present

## 2018-01-08 DIAGNOSIS — R17 Unspecified jaundice: Secondary | ICD-10-CM | POA: Diagnosis not present

## 2018-01-08 DIAGNOSIS — Z4932 Encounter for adequacy testing for peritoneal dialysis: Secondary | ICD-10-CM | POA: Diagnosis not present

## 2018-01-08 DIAGNOSIS — D513 Other dietary vitamin B12 deficiency anemia: Secondary | ICD-10-CM | POA: Diagnosis not present

## 2018-01-08 DIAGNOSIS — N186 End stage renal disease: Secondary | ICD-10-CM | POA: Diagnosis not present

## 2018-01-08 DIAGNOSIS — N2589 Other disorders resulting from impaired renal tubular function: Secondary | ICD-10-CM | POA: Diagnosis not present

## 2018-01-09 DIAGNOSIS — D509 Iron deficiency anemia, unspecified: Secondary | ICD-10-CM | POA: Diagnosis not present

## 2018-01-09 DIAGNOSIS — N2589 Other disorders resulting from impaired renal tubular function: Secondary | ICD-10-CM | POA: Diagnosis not present

## 2018-01-09 DIAGNOSIS — D513 Other dietary vitamin B12 deficiency anemia: Secondary | ICD-10-CM | POA: Diagnosis not present

## 2018-01-09 DIAGNOSIS — N186 End stage renal disease: Secondary | ICD-10-CM | POA: Diagnosis not present

## 2018-01-09 DIAGNOSIS — R17 Unspecified jaundice: Secondary | ICD-10-CM | POA: Diagnosis not present

## 2018-01-09 DIAGNOSIS — Z4932 Encounter for adequacy testing for peritoneal dialysis: Secondary | ICD-10-CM | POA: Diagnosis not present

## 2018-01-10 DIAGNOSIS — R17 Unspecified jaundice: Secondary | ICD-10-CM | POA: Diagnosis not present

## 2018-01-10 DIAGNOSIS — N2589 Other disorders resulting from impaired renal tubular function: Secondary | ICD-10-CM | POA: Diagnosis not present

## 2018-01-10 DIAGNOSIS — N186 End stage renal disease: Secondary | ICD-10-CM | POA: Diagnosis not present

## 2018-01-10 DIAGNOSIS — D513 Other dietary vitamin B12 deficiency anemia: Secondary | ICD-10-CM | POA: Diagnosis not present

## 2018-01-10 DIAGNOSIS — D509 Iron deficiency anemia, unspecified: Secondary | ICD-10-CM | POA: Diagnosis not present

## 2018-01-10 DIAGNOSIS — Z4932 Encounter for adequacy testing for peritoneal dialysis: Secondary | ICD-10-CM | POA: Diagnosis not present

## 2018-01-11 DIAGNOSIS — R17 Unspecified jaundice: Secondary | ICD-10-CM | POA: Diagnosis not present

## 2018-01-11 DIAGNOSIS — D513 Other dietary vitamin B12 deficiency anemia: Secondary | ICD-10-CM | POA: Diagnosis not present

## 2018-01-11 DIAGNOSIS — Z4932 Encounter for adequacy testing for peritoneal dialysis: Secondary | ICD-10-CM | POA: Diagnosis not present

## 2018-01-11 DIAGNOSIS — D509 Iron deficiency anemia, unspecified: Secondary | ICD-10-CM | POA: Diagnosis not present

## 2018-01-11 DIAGNOSIS — N2589 Other disorders resulting from impaired renal tubular function: Secondary | ICD-10-CM | POA: Diagnosis not present

## 2018-01-11 DIAGNOSIS — E785 Hyperlipidemia, unspecified: Secondary | ICD-10-CM | POA: Diagnosis not present

## 2018-01-11 DIAGNOSIS — N186 End stage renal disease: Secondary | ICD-10-CM | POA: Diagnosis not present

## 2018-01-11 DIAGNOSIS — R82998 Other abnormal findings in urine: Secondary | ICD-10-CM | POA: Diagnosis not present

## 2018-01-12 DIAGNOSIS — D513 Other dietary vitamin B12 deficiency anemia: Secondary | ICD-10-CM | POA: Diagnosis not present

## 2018-01-12 DIAGNOSIS — N2589 Other disorders resulting from impaired renal tubular function: Secondary | ICD-10-CM | POA: Diagnosis not present

## 2018-01-12 DIAGNOSIS — N186 End stage renal disease: Secondary | ICD-10-CM | POA: Diagnosis not present

## 2018-01-12 DIAGNOSIS — Z4932 Encounter for adequacy testing for peritoneal dialysis: Secondary | ICD-10-CM | POA: Diagnosis not present

## 2018-01-12 DIAGNOSIS — R17 Unspecified jaundice: Secondary | ICD-10-CM | POA: Diagnosis not present

## 2018-01-12 DIAGNOSIS — D509 Iron deficiency anemia, unspecified: Secondary | ICD-10-CM | POA: Diagnosis not present

## 2018-01-13 DIAGNOSIS — D509 Iron deficiency anemia, unspecified: Secondary | ICD-10-CM | POA: Diagnosis not present

## 2018-01-13 DIAGNOSIS — D513 Other dietary vitamin B12 deficiency anemia: Secondary | ICD-10-CM | POA: Diagnosis not present

## 2018-01-13 DIAGNOSIS — N2589 Other disorders resulting from impaired renal tubular function: Secondary | ICD-10-CM | POA: Diagnosis not present

## 2018-01-13 DIAGNOSIS — R17 Unspecified jaundice: Secondary | ICD-10-CM | POA: Diagnosis not present

## 2018-01-13 DIAGNOSIS — Z4932 Encounter for adequacy testing for peritoneal dialysis: Secondary | ICD-10-CM | POA: Diagnosis not present

## 2018-01-13 DIAGNOSIS — N186 End stage renal disease: Secondary | ICD-10-CM | POA: Diagnosis not present

## 2018-01-14 DIAGNOSIS — R17 Unspecified jaundice: Secondary | ICD-10-CM | POA: Diagnosis not present

## 2018-01-14 DIAGNOSIS — N186 End stage renal disease: Secondary | ICD-10-CM | POA: Diagnosis not present

## 2018-01-14 DIAGNOSIS — D513 Other dietary vitamin B12 deficiency anemia: Secondary | ICD-10-CM | POA: Diagnosis not present

## 2018-01-14 DIAGNOSIS — Z4932 Encounter for adequacy testing for peritoneal dialysis: Secondary | ICD-10-CM | POA: Diagnosis not present

## 2018-01-14 DIAGNOSIS — D509 Iron deficiency anemia, unspecified: Secondary | ICD-10-CM | POA: Diagnosis not present

## 2018-01-14 DIAGNOSIS — N2589 Other disorders resulting from impaired renal tubular function: Secondary | ICD-10-CM | POA: Diagnosis not present

## 2018-01-15 DIAGNOSIS — N2589 Other disorders resulting from impaired renal tubular function: Secondary | ICD-10-CM | POA: Diagnosis not present

## 2018-01-15 DIAGNOSIS — D509 Iron deficiency anemia, unspecified: Secondary | ICD-10-CM | POA: Diagnosis not present

## 2018-01-15 DIAGNOSIS — Z4932 Encounter for adequacy testing for peritoneal dialysis: Secondary | ICD-10-CM | POA: Diagnosis not present

## 2018-01-15 DIAGNOSIS — N186 End stage renal disease: Secondary | ICD-10-CM | POA: Diagnosis not present

## 2018-01-15 DIAGNOSIS — D513 Other dietary vitamin B12 deficiency anemia: Secondary | ICD-10-CM | POA: Diagnosis not present

## 2018-01-15 DIAGNOSIS — R17 Unspecified jaundice: Secondary | ICD-10-CM | POA: Diagnosis not present

## 2018-01-16 DIAGNOSIS — D513 Other dietary vitamin B12 deficiency anemia: Secondary | ICD-10-CM | POA: Diagnosis not present

## 2018-01-16 DIAGNOSIS — Z4932 Encounter for adequacy testing for peritoneal dialysis: Secondary | ICD-10-CM | POA: Diagnosis not present

## 2018-01-16 DIAGNOSIS — D509 Iron deficiency anemia, unspecified: Secondary | ICD-10-CM | POA: Diagnosis not present

## 2018-01-16 DIAGNOSIS — R17 Unspecified jaundice: Secondary | ICD-10-CM | POA: Diagnosis not present

## 2018-01-16 DIAGNOSIS — N186 End stage renal disease: Secondary | ICD-10-CM | POA: Diagnosis not present

## 2018-01-16 DIAGNOSIS — N2589 Other disorders resulting from impaired renal tubular function: Secondary | ICD-10-CM | POA: Diagnosis not present

## 2018-01-17 DIAGNOSIS — N186 End stage renal disease: Secondary | ICD-10-CM | POA: Diagnosis not present

## 2018-01-17 DIAGNOSIS — R17 Unspecified jaundice: Secondary | ICD-10-CM | POA: Diagnosis not present

## 2018-01-17 DIAGNOSIS — N2589 Other disorders resulting from impaired renal tubular function: Secondary | ICD-10-CM | POA: Diagnosis not present

## 2018-01-17 DIAGNOSIS — D509 Iron deficiency anemia, unspecified: Secondary | ICD-10-CM | POA: Diagnosis not present

## 2018-01-17 DIAGNOSIS — Z4932 Encounter for adequacy testing for peritoneal dialysis: Secondary | ICD-10-CM | POA: Diagnosis not present

## 2018-01-17 DIAGNOSIS — D513 Other dietary vitamin B12 deficiency anemia: Secondary | ICD-10-CM | POA: Diagnosis not present

## 2018-01-18 DIAGNOSIS — N2589 Other disorders resulting from impaired renal tubular function: Secondary | ICD-10-CM | POA: Diagnosis not present

## 2018-01-18 DIAGNOSIS — R17 Unspecified jaundice: Secondary | ICD-10-CM | POA: Diagnosis not present

## 2018-01-18 DIAGNOSIS — N186 End stage renal disease: Secondary | ICD-10-CM | POA: Diagnosis not present

## 2018-01-18 DIAGNOSIS — Z4932 Encounter for adequacy testing for peritoneal dialysis: Secondary | ICD-10-CM | POA: Diagnosis not present

## 2018-01-18 DIAGNOSIS — D513 Other dietary vitamin B12 deficiency anemia: Secondary | ICD-10-CM | POA: Diagnosis not present

## 2018-01-18 DIAGNOSIS — D509 Iron deficiency anemia, unspecified: Secondary | ICD-10-CM | POA: Diagnosis not present

## 2018-01-19 DIAGNOSIS — D513 Other dietary vitamin B12 deficiency anemia: Secondary | ICD-10-CM | POA: Diagnosis not present

## 2018-01-19 DIAGNOSIS — R17 Unspecified jaundice: Secondary | ICD-10-CM | POA: Diagnosis not present

## 2018-01-19 DIAGNOSIS — N186 End stage renal disease: Secondary | ICD-10-CM | POA: Diagnosis not present

## 2018-01-19 DIAGNOSIS — Z4932 Encounter for adequacy testing for peritoneal dialysis: Secondary | ICD-10-CM | POA: Diagnosis not present

## 2018-01-19 DIAGNOSIS — N2589 Other disorders resulting from impaired renal tubular function: Secondary | ICD-10-CM | POA: Diagnosis not present

## 2018-01-19 DIAGNOSIS — D509 Iron deficiency anemia, unspecified: Secondary | ICD-10-CM | POA: Diagnosis not present

## 2018-01-20 DIAGNOSIS — N186 End stage renal disease: Secondary | ICD-10-CM | POA: Diagnosis not present

## 2018-01-20 DIAGNOSIS — Z4932 Encounter for adequacy testing for peritoneal dialysis: Secondary | ICD-10-CM | POA: Diagnosis not present

## 2018-01-20 DIAGNOSIS — D509 Iron deficiency anemia, unspecified: Secondary | ICD-10-CM | POA: Diagnosis not present

## 2018-01-20 DIAGNOSIS — R17 Unspecified jaundice: Secondary | ICD-10-CM | POA: Diagnosis not present

## 2018-01-20 DIAGNOSIS — N2589 Other disorders resulting from impaired renal tubular function: Secondary | ICD-10-CM | POA: Diagnosis not present

## 2018-01-20 DIAGNOSIS — D513 Other dietary vitamin B12 deficiency anemia: Secondary | ICD-10-CM | POA: Diagnosis not present

## 2018-01-21 DIAGNOSIS — R17 Unspecified jaundice: Secondary | ICD-10-CM | POA: Diagnosis not present

## 2018-01-21 DIAGNOSIS — Z4932 Encounter for adequacy testing for peritoneal dialysis: Secondary | ICD-10-CM | POA: Diagnosis not present

## 2018-01-21 DIAGNOSIS — N2589 Other disorders resulting from impaired renal tubular function: Secondary | ICD-10-CM | POA: Diagnosis not present

## 2018-01-21 DIAGNOSIS — D509 Iron deficiency anemia, unspecified: Secondary | ICD-10-CM | POA: Diagnosis not present

## 2018-01-21 DIAGNOSIS — N186 End stage renal disease: Secondary | ICD-10-CM | POA: Diagnosis not present

## 2018-01-21 DIAGNOSIS — D513 Other dietary vitamin B12 deficiency anemia: Secondary | ICD-10-CM | POA: Diagnosis not present

## 2018-01-22 DIAGNOSIS — R17 Unspecified jaundice: Secondary | ICD-10-CM | POA: Diagnosis not present

## 2018-01-22 DIAGNOSIS — N186 End stage renal disease: Secondary | ICD-10-CM | POA: Diagnosis not present

## 2018-01-22 DIAGNOSIS — N2589 Other disorders resulting from impaired renal tubular function: Secondary | ICD-10-CM | POA: Diagnosis not present

## 2018-01-22 DIAGNOSIS — Z4932 Encounter for adequacy testing for peritoneal dialysis: Secondary | ICD-10-CM | POA: Diagnosis not present

## 2018-01-22 DIAGNOSIS — D509 Iron deficiency anemia, unspecified: Secondary | ICD-10-CM | POA: Diagnosis not present

## 2018-01-22 DIAGNOSIS — D513 Other dietary vitamin B12 deficiency anemia: Secondary | ICD-10-CM | POA: Diagnosis not present

## 2018-01-23 DIAGNOSIS — D513 Other dietary vitamin B12 deficiency anemia: Secondary | ICD-10-CM | POA: Diagnosis not present

## 2018-01-23 DIAGNOSIS — Z4932 Encounter for adequacy testing for peritoneal dialysis: Secondary | ICD-10-CM | POA: Diagnosis not present

## 2018-01-23 DIAGNOSIS — N186 End stage renal disease: Secondary | ICD-10-CM | POA: Diagnosis not present

## 2018-01-23 DIAGNOSIS — D509 Iron deficiency anemia, unspecified: Secondary | ICD-10-CM | POA: Diagnosis not present

## 2018-01-23 DIAGNOSIS — R17 Unspecified jaundice: Secondary | ICD-10-CM | POA: Diagnosis not present

## 2018-01-23 DIAGNOSIS — N2589 Other disorders resulting from impaired renal tubular function: Secondary | ICD-10-CM | POA: Diagnosis not present

## 2018-01-24 DIAGNOSIS — N186 End stage renal disease: Secondary | ICD-10-CM | POA: Diagnosis not present

## 2018-01-24 DIAGNOSIS — R17 Unspecified jaundice: Secondary | ICD-10-CM | POA: Diagnosis not present

## 2018-01-24 DIAGNOSIS — D509 Iron deficiency anemia, unspecified: Secondary | ICD-10-CM | POA: Diagnosis not present

## 2018-01-24 DIAGNOSIS — D513 Other dietary vitamin B12 deficiency anemia: Secondary | ICD-10-CM | POA: Diagnosis not present

## 2018-01-24 DIAGNOSIS — N2589 Other disorders resulting from impaired renal tubular function: Secondary | ICD-10-CM | POA: Diagnosis not present

## 2018-01-24 DIAGNOSIS — Z4932 Encounter for adequacy testing for peritoneal dialysis: Secondary | ICD-10-CM | POA: Diagnosis not present

## 2018-01-25 DIAGNOSIS — N2589 Other disorders resulting from impaired renal tubular function: Secondary | ICD-10-CM | POA: Diagnosis not present

## 2018-01-25 DIAGNOSIS — D513 Other dietary vitamin B12 deficiency anemia: Secondary | ICD-10-CM | POA: Diagnosis not present

## 2018-01-25 DIAGNOSIS — R17 Unspecified jaundice: Secondary | ICD-10-CM | POA: Diagnosis not present

## 2018-01-25 DIAGNOSIS — D509 Iron deficiency anemia, unspecified: Secondary | ICD-10-CM | POA: Diagnosis not present

## 2018-01-25 DIAGNOSIS — Z4932 Encounter for adequacy testing for peritoneal dialysis: Secondary | ICD-10-CM | POA: Diagnosis not present

## 2018-01-25 DIAGNOSIS — N186 End stage renal disease: Secondary | ICD-10-CM | POA: Diagnosis not present

## 2018-01-26 DIAGNOSIS — D513 Other dietary vitamin B12 deficiency anemia: Secondary | ICD-10-CM | POA: Diagnosis not present

## 2018-01-26 DIAGNOSIS — R17 Unspecified jaundice: Secondary | ICD-10-CM | POA: Diagnosis not present

## 2018-01-26 DIAGNOSIS — N186 End stage renal disease: Secondary | ICD-10-CM | POA: Diagnosis not present

## 2018-01-26 DIAGNOSIS — Z4932 Encounter for adequacy testing for peritoneal dialysis: Secondary | ICD-10-CM | POA: Diagnosis not present

## 2018-01-26 DIAGNOSIS — N2589 Other disorders resulting from impaired renal tubular function: Secondary | ICD-10-CM | POA: Diagnosis not present

## 2018-01-26 DIAGNOSIS — D509 Iron deficiency anemia, unspecified: Secondary | ICD-10-CM | POA: Diagnosis not present

## 2018-01-27 DIAGNOSIS — N186 End stage renal disease: Secondary | ICD-10-CM | POA: Diagnosis not present

## 2018-01-27 DIAGNOSIS — R17 Unspecified jaundice: Secondary | ICD-10-CM | POA: Diagnosis not present

## 2018-01-27 DIAGNOSIS — D509 Iron deficiency anemia, unspecified: Secondary | ICD-10-CM | POA: Diagnosis not present

## 2018-01-27 DIAGNOSIS — N2589 Other disorders resulting from impaired renal tubular function: Secondary | ICD-10-CM | POA: Diagnosis not present

## 2018-01-27 DIAGNOSIS — Z4932 Encounter for adequacy testing for peritoneal dialysis: Secondary | ICD-10-CM | POA: Diagnosis not present

## 2018-01-27 DIAGNOSIS — D513 Other dietary vitamin B12 deficiency anemia: Secondary | ICD-10-CM | POA: Diagnosis not present

## 2018-01-28 DIAGNOSIS — D509 Iron deficiency anemia, unspecified: Secondary | ICD-10-CM | POA: Diagnosis not present

## 2018-01-28 DIAGNOSIS — N2589 Other disorders resulting from impaired renal tubular function: Secondary | ICD-10-CM | POA: Diagnosis not present

## 2018-01-28 DIAGNOSIS — N186 End stage renal disease: Secondary | ICD-10-CM | POA: Diagnosis not present

## 2018-01-28 DIAGNOSIS — Z4932 Encounter for adequacy testing for peritoneal dialysis: Secondary | ICD-10-CM | POA: Diagnosis not present

## 2018-01-28 DIAGNOSIS — D513 Other dietary vitamin B12 deficiency anemia: Secondary | ICD-10-CM | POA: Diagnosis not present

## 2018-01-28 DIAGNOSIS — R17 Unspecified jaundice: Secondary | ICD-10-CM | POA: Diagnosis not present

## 2018-01-29 DIAGNOSIS — R17 Unspecified jaundice: Secondary | ICD-10-CM | POA: Diagnosis not present

## 2018-01-29 DIAGNOSIS — D513 Other dietary vitamin B12 deficiency anemia: Secondary | ICD-10-CM | POA: Diagnosis not present

## 2018-01-29 DIAGNOSIS — N2589 Other disorders resulting from impaired renal tubular function: Secondary | ICD-10-CM | POA: Diagnosis not present

## 2018-01-29 DIAGNOSIS — D509 Iron deficiency anemia, unspecified: Secondary | ICD-10-CM | POA: Diagnosis not present

## 2018-01-29 DIAGNOSIS — Z4932 Encounter for adequacy testing for peritoneal dialysis: Secondary | ICD-10-CM | POA: Diagnosis not present

## 2018-01-29 DIAGNOSIS — N186 End stage renal disease: Secondary | ICD-10-CM | POA: Diagnosis not present

## 2018-01-30 DIAGNOSIS — Z4932 Encounter for adequacy testing for peritoneal dialysis: Secondary | ICD-10-CM | POA: Diagnosis not present

## 2018-01-30 DIAGNOSIS — N186 End stage renal disease: Secondary | ICD-10-CM | POA: Diagnosis not present

## 2018-01-30 DIAGNOSIS — D513 Other dietary vitamin B12 deficiency anemia: Secondary | ICD-10-CM | POA: Diagnosis not present

## 2018-01-30 DIAGNOSIS — R17 Unspecified jaundice: Secondary | ICD-10-CM | POA: Diagnosis not present

## 2018-01-30 DIAGNOSIS — N2589 Other disorders resulting from impaired renal tubular function: Secondary | ICD-10-CM | POA: Diagnosis not present

## 2018-01-30 DIAGNOSIS — D509 Iron deficiency anemia, unspecified: Secondary | ICD-10-CM | POA: Diagnosis not present

## 2018-01-31 DIAGNOSIS — N2589 Other disorders resulting from impaired renal tubular function: Secondary | ICD-10-CM | POA: Diagnosis not present

## 2018-01-31 DIAGNOSIS — Z4932 Encounter for adequacy testing for peritoneal dialysis: Secondary | ICD-10-CM | POA: Diagnosis not present

## 2018-01-31 DIAGNOSIS — N186 End stage renal disease: Secondary | ICD-10-CM | POA: Diagnosis not present

## 2018-01-31 DIAGNOSIS — D509 Iron deficiency anemia, unspecified: Secondary | ICD-10-CM | POA: Diagnosis not present

## 2018-01-31 DIAGNOSIS — D513 Other dietary vitamin B12 deficiency anemia: Secondary | ICD-10-CM | POA: Diagnosis not present

## 2018-01-31 DIAGNOSIS — R17 Unspecified jaundice: Secondary | ICD-10-CM | POA: Diagnosis not present

## 2018-02-01 DIAGNOSIS — N2589 Other disorders resulting from impaired renal tubular function: Secondary | ICD-10-CM | POA: Diagnosis not present

## 2018-02-01 DIAGNOSIS — Z4932 Encounter for adequacy testing for peritoneal dialysis: Secondary | ICD-10-CM | POA: Diagnosis not present

## 2018-02-01 DIAGNOSIS — D509 Iron deficiency anemia, unspecified: Secondary | ICD-10-CM | POA: Diagnosis not present

## 2018-02-01 DIAGNOSIS — D513 Other dietary vitamin B12 deficiency anemia: Secondary | ICD-10-CM | POA: Diagnosis not present

## 2018-02-01 DIAGNOSIS — R17 Unspecified jaundice: Secondary | ICD-10-CM | POA: Diagnosis not present

## 2018-02-01 DIAGNOSIS — N186 End stage renal disease: Secondary | ICD-10-CM | POA: Diagnosis not present

## 2018-02-02 DIAGNOSIS — N2589 Other disorders resulting from impaired renal tubular function: Secondary | ICD-10-CM | POA: Diagnosis not present

## 2018-02-02 DIAGNOSIS — R17 Unspecified jaundice: Secondary | ICD-10-CM | POA: Diagnosis not present

## 2018-02-02 DIAGNOSIS — D509 Iron deficiency anemia, unspecified: Secondary | ICD-10-CM | POA: Diagnosis not present

## 2018-02-02 DIAGNOSIS — Z4932 Encounter for adequacy testing for peritoneal dialysis: Secondary | ICD-10-CM | POA: Diagnosis not present

## 2018-02-02 DIAGNOSIS — N186 End stage renal disease: Secondary | ICD-10-CM | POA: Diagnosis not present

## 2018-02-02 DIAGNOSIS — D513 Other dietary vitamin B12 deficiency anemia: Secondary | ICD-10-CM | POA: Diagnosis not present

## 2018-02-03 DIAGNOSIS — M311 Thrombotic microangiopathy: Secondary | ICD-10-CM | POA: Diagnosis not present

## 2018-02-03 DIAGNOSIS — Z992 Dependence on renal dialysis: Secondary | ICD-10-CM | POA: Diagnosis not present

## 2018-02-03 DIAGNOSIS — N186 End stage renal disease: Secondary | ICD-10-CM | POA: Diagnosis not present

## 2018-02-03 DIAGNOSIS — Z4932 Encounter for adequacy testing for peritoneal dialysis: Secondary | ICD-10-CM | POA: Diagnosis not present

## 2018-02-03 DIAGNOSIS — R17 Unspecified jaundice: Secondary | ICD-10-CM | POA: Diagnosis not present

## 2018-02-03 DIAGNOSIS — N2589 Other disorders resulting from impaired renal tubular function: Secondary | ICD-10-CM | POA: Diagnosis not present

## 2018-02-03 DIAGNOSIS — D513 Other dietary vitamin B12 deficiency anemia: Secondary | ICD-10-CM | POA: Diagnosis not present

## 2018-02-03 DIAGNOSIS — D509 Iron deficiency anemia, unspecified: Secondary | ICD-10-CM | POA: Diagnosis not present

## 2018-02-04 DIAGNOSIS — D509 Iron deficiency anemia, unspecified: Secondary | ICD-10-CM | POA: Diagnosis not present

## 2018-02-04 DIAGNOSIS — N2581 Secondary hyperparathyroidism of renal origin: Secondary | ICD-10-CM | POA: Diagnosis not present

## 2018-02-04 DIAGNOSIS — Z79899 Other long term (current) drug therapy: Secondary | ICD-10-CM | POA: Diagnosis not present

## 2018-02-04 DIAGNOSIS — M311 Thrombotic microangiopathy: Secondary | ICD-10-CM | POA: Diagnosis not present

## 2018-02-04 DIAGNOSIS — D631 Anemia in chronic kidney disease: Secondary | ICD-10-CM | POA: Diagnosis not present

## 2018-02-04 DIAGNOSIS — Z4932 Encounter for adequacy testing for peritoneal dialysis: Secondary | ICD-10-CM | POA: Diagnosis not present

## 2018-02-04 DIAGNOSIS — R17 Unspecified jaundice: Secondary | ICD-10-CM | POA: Diagnosis not present

## 2018-02-04 DIAGNOSIS — N186 End stage renal disease: Secondary | ICD-10-CM | POA: Diagnosis not present

## 2018-02-04 DIAGNOSIS — E440 Moderate protein-calorie malnutrition: Secondary | ICD-10-CM | POA: Diagnosis not present

## 2018-02-04 DIAGNOSIS — Z992 Dependence on renal dialysis: Secondary | ICD-10-CM | POA: Diagnosis not present

## 2018-02-05 DIAGNOSIS — D631 Anemia in chronic kidney disease: Secondary | ICD-10-CM | POA: Diagnosis not present

## 2018-02-05 DIAGNOSIS — Z4932 Encounter for adequacy testing for peritoneal dialysis: Secondary | ICD-10-CM | POA: Diagnosis not present

## 2018-02-05 DIAGNOSIS — N2581 Secondary hyperparathyroidism of renal origin: Secondary | ICD-10-CM | POA: Diagnosis not present

## 2018-02-05 DIAGNOSIS — N186 End stage renal disease: Secondary | ICD-10-CM | POA: Diagnosis not present

## 2018-02-05 DIAGNOSIS — D509 Iron deficiency anemia, unspecified: Secondary | ICD-10-CM | POA: Diagnosis not present

## 2018-02-05 DIAGNOSIS — R17 Unspecified jaundice: Secondary | ICD-10-CM | POA: Diagnosis not present

## 2018-02-06 DIAGNOSIS — Z4932 Encounter for adequacy testing for peritoneal dialysis: Secondary | ICD-10-CM | POA: Diagnosis not present

## 2018-02-06 DIAGNOSIS — N2581 Secondary hyperparathyroidism of renal origin: Secondary | ICD-10-CM | POA: Diagnosis not present

## 2018-02-06 DIAGNOSIS — D509 Iron deficiency anemia, unspecified: Secondary | ICD-10-CM | POA: Diagnosis not present

## 2018-02-06 DIAGNOSIS — D631 Anemia in chronic kidney disease: Secondary | ICD-10-CM | POA: Diagnosis not present

## 2018-02-06 DIAGNOSIS — N186 End stage renal disease: Secondary | ICD-10-CM | POA: Diagnosis not present

## 2018-02-06 DIAGNOSIS — R17 Unspecified jaundice: Secondary | ICD-10-CM | POA: Diagnosis not present

## 2018-02-07 DIAGNOSIS — N2581 Secondary hyperparathyroidism of renal origin: Secondary | ICD-10-CM | POA: Diagnosis not present

## 2018-02-07 DIAGNOSIS — Z4932 Encounter for adequacy testing for peritoneal dialysis: Secondary | ICD-10-CM | POA: Diagnosis not present

## 2018-02-07 DIAGNOSIS — D631 Anemia in chronic kidney disease: Secondary | ICD-10-CM | POA: Diagnosis not present

## 2018-02-07 DIAGNOSIS — R17 Unspecified jaundice: Secondary | ICD-10-CM | POA: Diagnosis not present

## 2018-02-07 DIAGNOSIS — D509 Iron deficiency anemia, unspecified: Secondary | ICD-10-CM | POA: Diagnosis not present

## 2018-02-07 DIAGNOSIS — N186 End stage renal disease: Secondary | ICD-10-CM | POA: Diagnosis not present

## 2018-02-08 DIAGNOSIS — N186 End stage renal disease: Secondary | ICD-10-CM | POA: Diagnosis not present

## 2018-02-08 DIAGNOSIS — R82998 Other abnormal findings in urine: Secondary | ICD-10-CM | POA: Diagnosis not present

## 2018-02-08 DIAGNOSIS — D631 Anemia in chronic kidney disease: Secondary | ICD-10-CM | POA: Diagnosis not present

## 2018-02-08 DIAGNOSIS — D509 Iron deficiency anemia, unspecified: Secondary | ICD-10-CM | POA: Diagnosis not present

## 2018-02-08 DIAGNOSIS — N2581 Secondary hyperparathyroidism of renal origin: Secondary | ICD-10-CM | POA: Diagnosis not present

## 2018-02-08 DIAGNOSIS — R17 Unspecified jaundice: Secondary | ICD-10-CM | POA: Diagnosis not present

## 2018-02-08 DIAGNOSIS — Z4932 Encounter for adequacy testing for peritoneal dialysis: Secondary | ICD-10-CM | POA: Diagnosis not present

## 2018-02-09 DIAGNOSIS — D509 Iron deficiency anemia, unspecified: Secondary | ICD-10-CM | POA: Diagnosis not present

## 2018-02-09 DIAGNOSIS — R17 Unspecified jaundice: Secondary | ICD-10-CM | POA: Diagnosis not present

## 2018-02-09 DIAGNOSIS — Z4932 Encounter for adequacy testing for peritoneal dialysis: Secondary | ICD-10-CM | POA: Diagnosis not present

## 2018-02-09 DIAGNOSIS — D631 Anemia in chronic kidney disease: Secondary | ICD-10-CM | POA: Diagnosis not present

## 2018-02-09 DIAGNOSIS — N186 End stage renal disease: Secondary | ICD-10-CM | POA: Diagnosis not present

## 2018-02-09 DIAGNOSIS — N2581 Secondary hyperparathyroidism of renal origin: Secondary | ICD-10-CM | POA: Diagnosis not present

## 2018-02-10 DIAGNOSIS — Z4932 Encounter for adequacy testing for peritoneal dialysis: Secondary | ICD-10-CM | POA: Diagnosis not present

## 2018-02-10 DIAGNOSIS — N186 End stage renal disease: Secondary | ICD-10-CM | POA: Diagnosis not present

## 2018-02-10 DIAGNOSIS — R17 Unspecified jaundice: Secondary | ICD-10-CM | POA: Diagnosis not present

## 2018-02-10 DIAGNOSIS — N2581 Secondary hyperparathyroidism of renal origin: Secondary | ICD-10-CM | POA: Diagnosis not present

## 2018-02-10 DIAGNOSIS — D631 Anemia in chronic kidney disease: Secondary | ICD-10-CM | POA: Diagnosis not present

## 2018-02-10 DIAGNOSIS — D509 Iron deficiency anemia, unspecified: Secondary | ICD-10-CM | POA: Diagnosis not present

## 2018-02-11 DIAGNOSIS — Z4932 Encounter for adequacy testing for peritoneal dialysis: Secondary | ICD-10-CM | POA: Diagnosis not present

## 2018-02-11 DIAGNOSIS — R17 Unspecified jaundice: Secondary | ICD-10-CM | POA: Diagnosis not present

## 2018-02-11 DIAGNOSIS — D509 Iron deficiency anemia, unspecified: Secondary | ICD-10-CM | POA: Diagnosis not present

## 2018-02-11 DIAGNOSIS — N2581 Secondary hyperparathyroidism of renal origin: Secondary | ICD-10-CM | POA: Diagnosis not present

## 2018-02-11 DIAGNOSIS — D631 Anemia in chronic kidney disease: Secondary | ICD-10-CM | POA: Diagnosis not present

## 2018-02-11 DIAGNOSIS — N186 End stage renal disease: Secondary | ICD-10-CM | POA: Diagnosis not present

## 2018-02-12 DIAGNOSIS — R17 Unspecified jaundice: Secondary | ICD-10-CM | POA: Diagnosis not present

## 2018-02-12 DIAGNOSIS — N186 End stage renal disease: Secondary | ICD-10-CM | POA: Diagnosis not present

## 2018-02-12 DIAGNOSIS — N2581 Secondary hyperparathyroidism of renal origin: Secondary | ICD-10-CM | POA: Diagnosis not present

## 2018-02-12 DIAGNOSIS — D631 Anemia in chronic kidney disease: Secondary | ICD-10-CM | POA: Diagnosis not present

## 2018-02-12 DIAGNOSIS — Z4932 Encounter for adequacy testing for peritoneal dialysis: Secondary | ICD-10-CM | POA: Diagnosis not present

## 2018-02-12 DIAGNOSIS — D509 Iron deficiency anemia, unspecified: Secondary | ICD-10-CM | POA: Diagnosis not present

## 2018-02-13 DIAGNOSIS — R17 Unspecified jaundice: Secondary | ICD-10-CM | POA: Diagnosis not present

## 2018-02-13 DIAGNOSIS — N186 End stage renal disease: Secondary | ICD-10-CM | POA: Diagnosis not present

## 2018-02-13 DIAGNOSIS — D509 Iron deficiency anemia, unspecified: Secondary | ICD-10-CM | POA: Diagnosis not present

## 2018-02-13 DIAGNOSIS — D631 Anemia in chronic kidney disease: Secondary | ICD-10-CM | POA: Diagnosis not present

## 2018-02-13 DIAGNOSIS — N2581 Secondary hyperparathyroidism of renal origin: Secondary | ICD-10-CM | POA: Diagnosis not present

## 2018-02-13 DIAGNOSIS — Z4932 Encounter for adequacy testing for peritoneal dialysis: Secondary | ICD-10-CM | POA: Diagnosis not present

## 2018-02-14 DIAGNOSIS — N186 End stage renal disease: Secondary | ICD-10-CM | POA: Diagnosis not present

## 2018-02-14 DIAGNOSIS — R17 Unspecified jaundice: Secondary | ICD-10-CM | POA: Diagnosis not present

## 2018-02-14 DIAGNOSIS — N2581 Secondary hyperparathyroidism of renal origin: Secondary | ICD-10-CM | POA: Diagnosis not present

## 2018-02-14 DIAGNOSIS — Z4932 Encounter for adequacy testing for peritoneal dialysis: Secondary | ICD-10-CM | POA: Diagnosis not present

## 2018-02-14 DIAGNOSIS — D509 Iron deficiency anemia, unspecified: Secondary | ICD-10-CM | POA: Diagnosis not present

## 2018-02-14 DIAGNOSIS — D631 Anemia in chronic kidney disease: Secondary | ICD-10-CM | POA: Diagnosis not present

## 2018-02-15 DIAGNOSIS — R17 Unspecified jaundice: Secondary | ICD-10-CM | POA: Diagnosis not present

## 2018-02-15 DIAGNOSIS — N186 End stage renal disease: Secondary | ICD-10-CM | POA: Diagnosis not present

## 2018-02-15 DIAGNOSIS — Z4932 Encounter for adequacy testing for peritoneal dialysis: Secondary | ICD-10-CM | POA: Diagnosis not present

## 2018-02-15 DIAGNOSIS — D631 Anemia in chronic kidney disease: Secondary | ICD-10-CM | POA: Diagnosis not present

## 2018-02-15 DIAGNOSIS — D509 Iron deficiency anemia, unspecified: Secondary | ICD-10-CM | POA: Diagnosis not present

## 2018-02-15 DIAGNOSIS — N2581 Secondary hyperparathyroidism of renal origin: Secondary | ICD-10-CM | POA: Diagnosis not present

## 2018-02-16 DIAGNOSIS — Z4932 Encounter for adequacy testing for peritoneal dialysis: Secondary | ICD-10-CM | POA: Diagnosis not present

## 2018-02-16 DIAGNOSIS — N186 End stage renal disease: Secondary | ICD-10-CM | POA: Diagnosis not present

## 2018-02-16 DIAGNOSIS — D509 Iron deficiency anemia, unspecified: Secondary | ICD-10-CM | POA: Diagnosis not present

## 2018-02-16 DIAGNOSIS — D631 Anemia in chronic kidney disease: Secondary | ICD-10-CM | POA: Diagnosis not present

## 2018-02-16 DIAGNOSIS — R17 Unspecified jaundice: Secondary | ICD-10-CM | POA: Diagnosis not present

## 2018-02-16 DIAGNOSIS — N2581 Secondary hyperparathyroidism of renal origin: Secondary | ICD-10-CM | POA: Diagnosis not present

## 2018-02-17 DIAGNOSIS — D631 Anemia in chronic kidney disease: Secondary | ICD-10-CM | POA: Diagnosis not present

## 2018-02-17 DIAGNOSIS — N186 End stage renal disease: Secondary | ICD-10-CM | POA: Diagnosis not present

## 2018-02-17 DIAGNOSIS — R17 Unspecified jaundice: Secondary | ICD-10-CM | POA: Diagnosis not present

## 2018-02-17 DIAGNOSIS — N2581 Secondary hyperparathyroidism of renal origin: Secondary | ICD-10-CM | POA: Diagnosis not present

## 2018-02-17 DIAGNOSIS — Z4932 Encounter for adequacy testing for peritoneal dialysis: Secondary | ICD-10-CM | POA: Diagnosis not present

## 2018-02-17 DIAGNOSIS — D509 Iron deficiency anemia, unspecified: Secondary | ICD-10-CM | POA: Diagnosis not present

## 2018-02-18 DIAGNOSIS — N186 End stage renal disease: Secondary | ICD-10-CM | POA: Diagnosis not present

## 2018-02-18 DIAGNOSIS — R17 Unspecified jaundice: Secondary | ICD-10-CM | POA: Diagnosis not present

## 2018-02-18 DIAGNOSIS — D631 Anemia in chronic kidney disease: Secondary | ICD-10-CM | POA: Diagnosis not present

## 2018-02-18 DIAGNOSIS — Z4932 Encounter for adequacy testing for peritoneal dialysis: Secondary | ICD-10-CM | POA: Diagnosis not present

## 2018-02-18 DIAGNOSIS — N2581 Secondary hyperparathyroidism of renal origin: Secondary | ICD-10-CM | POA: Diagnosis not present

## 2018-02-18 DIAGNOSIS — D509 Iron deficiency anemia, unspecified: Secondary | ICD-10-CM | POA: Diagnosis not present

## 2018-02-19 DIAGNOSIS — N186 End stage renal disease: Secondary | ICD-10-CM | POA: Diagnosis not present

## 2018-02-19 DIAGNOSIS — N2581 Secondary hyperparathyroidism of renal origin: Secondary | ICD-10-CM | POA: Diagnosis not present

## 2018-02-19 DIAGNOSIS — D631 Anemia in chronic kidney disease: Secondary | ICD-10-CM | POA: Diagnosis not present

## 2018-02-19 DIAGNOSIS — D509 Iron deficiency anemia, unspecified: Secondary | ICD-10-CM | POA: Diagnosis not present

## 2018-02-19 DIAGNOSIS — Z4932 Encounter for adequacy testing for peritoneal dialysis: Secondary | ICD-10-CM | POA: Diagnosis not present

## 2018-02-19 DIAGNOSIS — R17 Unspecified jaundice: Secondary | ICD-10-CM | POA: Diagnosis not present

## 2018-02-20 DIAGNOSIS — N186 End stage renal disease: Secondary | ICD-10-CM | POA: Diagnosis not present

## 2018-02-20 DIAGNOSIS — D509 Iron deficiency anemia, unspecified: Secondary | ICD-10-CM | POA: Diagnosis not present

## 2018-02-20 DIAGNOSIS — D631 Anemia in chronic kidney disease: Secondary | ICD-10-CM | POA: Diagnosis not present

## 2018-02-20 DIAGNOSIS — N2581 Secondary hyperparathyroidism of renal origin: Secondary | ICD-10-CM | POA: Diagnosis not present

## 2018-02-20 DIAGNOSIS — Z4932 Encounter for adequacy testing for peritoneal dialysis: Secondary | ICD-10-CM | POA: Diagnosis not present

## 2018-02-20 DIAGNOSIS — R17 Unspecified jaundice: Secondary | ICD-10-CM | POA: Diagnosis not present

## 2018-02-21 ENCOUNTER — Emergency Department (HOSPITAL_COMMUNITY)
Admission: EM | Admit: 2018-02-21 | Discharge: 2018-02-21 | Disposition: A | Discharge status: Home / Self Care | Payer: Medicare Other | Attending: Emergency Medicine | Admitting: Emergency Medicine

## 2018-02-21 ENCOUNTER — Other Ambulatory Visit: Payer: Self-pay

## 2018-02-21 ENCOUNTER — Encounter (HOSPITAL_COMMUNITY): Payer: Self-pay

## 2018-02-21 DIAGNOSIS — Z79899 Other long term (current) drug therapy: Secondary | ICD-10-CM | POA: Diagnosis not present

## 2018-02-21 DIAGNOSIS — R17 Unspecified jaundice: Secondary | ICD-10-CM | POA: Diagnosis not present

## 2018-02-21 DIAGNOSIS — I120 Hypertensive chronic kidney disease with stage 5 chronic kidney disease or end stage renal disease: Secondary | ICD-10-CM | POA: Diagnosis not present

## 2018-02-21 DIAGNOSIS — R3915 Urgency of urination: Secondary | ICD-10-CM | POA: Diagnosis not present

## 2018-02-21 DIAGNOSIS — N3090 Cystitis, unspecified without hematuria: Secondary | ICD-10-CM | POA: Diagnosis not present

## 2018-02-21 DIAGNOSIS — N2581 Secondary hyperparathyroidism of renal origin: Secondary | ICD-10-CM | POA: Diagnosis not present

## 2018-02-21 DIAGNOSIS — R1030 Lower abdominal pain, unspecified: Secondary | ICD-10-CM | POA: Diagnosis not present

## 2018-02-21 DIAGNOSIS — D631 Anemia in chronic kidney disease: Secondary | ICD-10-CM | POA: Diagnosis not present

## 2018-02-21 DIAGNOSIS — N186 End stage renal disease: Secondary | ICD-10-CM | POA: Diagnosis not present

## 2018-02-21 DIAGNOSIS — Z4932 Encounter for adequacy testing for peritoneal dialysis: Secondary | ICD-10-CM | POA: Diagnosis not present

## 2018-02-21 DIAGNOSIS — D509 Iron deficiency anemia, unspecified: Secondary | ICD-10-CM | POA: Diagnosis not present

## 2018-02-21 DIAGNOSIS — R109 Unspecified abdominal pain: Secondary | ICD-10-CM | POA: Diagnosis not present

## 2018-02-21 LAB — COMPREHENSIVE METABOLIC PANEL
ALT: 33 U/L (ref 0–44)
AST: 48 U/L — ABNORMAL HIGH (ref 15–41)
Albumin: 3.2 g/dL — ABNORMAL LOW (ref 3.5–5.0)
Alkaline Phosphatase: 62 U/L (ref 38–126)
Anion gap: 17 — ABNORMAL HIGH (ref 5–15)
BUN: 68 mg/dL — ABNORMAL HIGH (ref 8–23)
CO2: 25 mmol/L (ref 22–32)
Calcium: 9.6 mg/dL (ref 8.9–10.3)
Chloride: 87 mmol/L — ABNORMAL LOW (ref 98–111)
Creatinine, Ser: 10.52 mg/dL — ABNORMAL HIGH (ref 0.44–1.00)
GFR calc Af Amer: 4 mL/min — ABNORMAL LOW (ref >60)
GFR calc non Af Amer: 3 mL/min — ABNORMAL LOW (ref >60)
Glucose, Bld: 97 mg/dL (ref 70–99)
Potassium: 4.2 mmol/L (ref 3.5–5.1)
Sodium: 129 mmol/L — ABNORMAL LOW (ref 135–145)
Total Bilirubin: 0.5 mg/dL (ref 0.3–1.2)
Total Protein: 5.5 g/dL — ABNORMAL LOW (ref 6.5–8.1)

## 2018-02-21 LAB — CBC
HCT: 35.5 % — ABNORMAL LOW (ref 36.0–46.0)
Hemoglobin: 11.2 g/dL — ABNORMAL LOW (ref 12.0–15.0)
MCH: 28.1 pg (ref 26.0–34.0)
MCHC: 31.5 g/dL (ref 30.0–36.0)
MCV: 89 fL (ref 80.0–100.0)
Platelets: 249 10*3/uL (ref 150–400)
RBC: 3.99 MIL/uL (ref 3.87–5.11)
RDW: 14.5 % (ref 11.5–15.5)
WBC: 8.1 10*3/uL (ref 4.0–10.5)
nRBC: 0 % (ref 0.0–0.2)

## 2018-02-21 LAB — LIPASE, BLOOD: Lipase: 58 U/L — ABNORMAL HIGH (ref 11–51)

## 2018-02-21 MED ORDER — LORAZEPAM 2 MG/ML IJ SOLN
1.0000 mg | Freq: Once | INTRAMUSCULAR | Status: AC
  Administered 2018-02-21: 1 mg via INTRAVENOUS
  Filled 2018-02-21: qty 1

## 2018-02-21 MED ORDER — HYDROCODONE-ACETAMINOPHEN 5-325 MG PO TABS: 1.0000 | ORAL_TABLET | ORAL | 0 refills | Status: DC | PRN

## 2018-02-21 MED ORDER — SODIUM CHLORIDE 0.9% FLUSH
3.0000 mL | Freq: Once | INTRAVENOUS | Status: AC
  Administered 2018-02-21: 3 mL via INTRAVENOUS

## 2018-02-21 MED ORDER — HYDROMORPHONE HCL 1 MG/ML IJ SOLN
0.5000 mg | Freq: Once | INTRAMUSCULAR | Status: AC
  Administered 2018-02-21: 0.5 mg via INTRAVENOUS
  Filled 2018-02-21: qty 1

## 2018-02-21 MED ORDER — CEPHALEXIN 250 MG PO CAPS: 250.0000 mg | ORAL_CAPSULE | Freq: Every day | ORAL | 0 refills | Status: DC

## 2018-02-21 MED ORDER — CEPHALEXIN 250 MG PO CAPS
250.0000 mg | ORAL_CAPSULE | Freq: Once | ORAL | Status: AC
  Administered 2018-02-21: 250 mg via ORAL
  Filled 2018-02-21: qty 1

## 2018-02-21 NOTE — ED Provider Notes (Signed)
[facility] EMERGENCY DEPARTMENT Provider Note   CSN: [id number] Arrival date & time: 02/21/18  1547    History   Chief Complaint Chief Complaint  Patient presents with  . Abdominal Pain  . Urinary Retention    HPI Erin Morgan is a 63 y.o. female.     HPI   89yF with abdominal pain and dysuria. ESRD getting peritoneal dialysis. Does makes a small amount of urine. Began having pain suprapubically a day ago. Burning when passes urine. No problems with dialysis. Line and fluids have looked fine. No fever. No vaginal bleeding or discharge.   Past Medical History:  Diagnosis Date  . Anemia   . Chronic kidney disease    dialysis - t/th/sa- [place]  . Constipation   . Family history of adverse reaction to anesthesia    mom and sister has n/v  . GERD (gastroesophageal reflux disease)   . History of blood transfusion   . Hypertension   . Muscle weakness   . Osteopenia   . PONV (postoperative nausea and vomiting)    Headache  . Rheumatoid arthritis ([place])   . Seasonal allergies   . Thoracic aortic aneurysm ([place]) 07/18/2017   4.0 cm ascending thoracic noted on CT 07/18/2017  . Wears glasses     Patient Active Problem List   Diagnosis Date Noted  . Weakness 01/12/2017  . Symptomatic anemia 07/26/2015  . GERD (gastroesophageal reflux disease) 07/26/2015  . ESRD (end stage renal disease) ([place])   . 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 Elevated troponin 07/18/2014  . Headache disorder   . Rheumatoid arthritis ([place]) 07/17/2014  . Hypertensive urgency 07/17/2014  . ESRD on dialysis ([place]) 07/17/2014  . Hyponatremia 07/17/2014  . Essential hypertension     Past Surgical History:  Procedure Laterality Date  . AV FISTULA PLACEMENT Left 11/18/2014   Procedure: BRACHIOCEPHALIC ARTERIOVENOUS (AV) FISTULA CREATION;  Surgeon: Conrad Braidwood, MD;  Location: [facility];  Service:  Vascular;  Laterality: Left;  . CARPAL TUNNEL RELEASE Right 04/05/2014   Procedure: RIGHT CARPAL TUNNEL RELEASE;  Surgeon: Daryll Brod, MD;  Location: [place];  Service: Orthopedics;  Laterality: Right;  ANESTHESIA:  IV REGIONAL FAB  . COLONOSCOPY    . REVISON OF ARTERIOVENOUS FISTULA Left 10/01/2016   Procedure: REVISON OF LEFT ARM ARTERIOVENOUS FISTULA;  Surgeon: Angelia Mould, MD;  Location: [facility] [place];  Service: Vascular;  Laterality: Left;  . REVISON OF ARTERIOVENOUS FISTULA Left 40/98/1191   Procedure: PLICATION OF LEFT ARM  ARTERIOVENOUS FISTULA;  Surgeon: Angelia Mould, MD;  Location: [place];  Service: Vascular;  Laterality: Left;  . TONSILLECTOMY    . TUMOR EXCISION  2000   right foot     OB History   No obstetric history on file.      Home Medications    Prior to Admission medications   Medication Sig Start Date End Date Taking? Authorizing Provider  acetaminophen (TYLENOL) 500 MG tablet Take 1,000 mg by mouth every 8 (eight) hours as needed for headache.    [provider]  ferric citrate (AURYXIA) 1 GM 210 MG(Fe) tablet Take 420 mg by mouth See admin instructions. Take 2 tablets (420 mg) by mouth with each meal & 2 tablet (210 mg) by mouth with each protein snack    [provider]  fluocinonide (LIDEX) 0.05 % external solution Apply 1 application topically at bedtime. MIX WITH CETAPHIL  09/06/16   [provider]  gentamicin cream (GARAMYCIN) 0.1 % Apply 1 application topically daily. For dialysis catheter    [provider]  hydrALAZINE (APRESOLINE) 100 MG tablet Take 100 mg by mouth 3 (three) times daily.     [provider]  hydroxychloroquine (PLAQUENIL) 200 MG tablet Take 200 mg by mouth 2 (two) times daily.    [provider]  labetalol (NORMODYNE) 200 MG tablet Take 200 mg by mouth 2 (two) times daily.  10/23/14   [provider]  omeprazole (PRILOSEC) 20 MG capsule Take 20 mg by  mouth 2 (two) times daily.     [provider]  oxyCODONE (ROXICODONE) 5 MG immediate release tablet Take 1 tablet (5 mg total) by mouth every 4 (four) hours as needed. 10/31/17   Angelia Mould, MD  SOLIRIS 300 MG/30ML SOLN injection Inject 300 mg into the vein every 14 (fourteen) days.  09/16/16   [provider]    Family History Family History  Problem Relation Age of Onset  . Hypertension Mother   . Cervical cancer Mother   . Cancer Mother   . Hyperlipidemia Mother   . Hypertension Father   . Stroke Father   . Hyperlipidemia Father   . Rheum arthritis Other     Social History Social History   Tobacco Use  . Smoking status: Never Smoker  . Smokeless tobacco: Never Used  Substance Use Topics  . Alcohol use: No    Comment:  no  . Drug use: No     Allergies   Patient has no known allergies.   Review of Systems Review of Systems  All systems reviewed and negative, other than as noted in HPI.  Physical Exam Updated Vital Signs BP 136/80   Pulse 93   Temp (!) 97.5 F (36.4 C) (Oral)   Resp (!) 25   SpO2 100%   Physical Exam Vitals signs and nursing note reviewed.  Constitutional:      General: She is not in acute distress.    Appearance: She is well-developed.  HENT:     Head: Normocephalic and atraumatic.  Eyes:     General:        Right eye: No discharge.        Left eye: No discharge.     Conjunctiva/sclera: Conjunctivae normal.  Neck:     Musculoskeletal: Neck supple.  Cardiovascular:     Rate and Rhythm: Normal rate and regular rhythm.     Heart sounds: Normal heart sounds. No murmur. No friction rub. No gallop.   Pulmonary:     Effort: Pulmonary effort is normal. No respiratory distress.     Breath sounds: Normal breath sounds.  Abdominal:     General: There is no distension.     Palpations: Abdomen is soft.     Tenderness: There is abdominal tenderness.     Comments: Soft. Tender suprapubically only w/o rebound or  guarding. Dialysis catheter looks fine.   Musculoskeletal:        General: No tenderness.  Skin:    General: Skin is warm and dry.  Neurological:     Mental Status: She is alert.  Psychiatric:        Behavior: Behavior normal.        Thought Content: Thought content normal.      ED Treatments / Results  Labs (all labs ordered are listed, but only abnormal results are displayed) Labs Reviewed  LIPASE, BLOOD - Abnormal;  Notable for the following components:      Result Value   Lipase 58 (*)    All other components within normal limits  COMPREHENSIVE METABOLIC PANEL - Abnormal; Notable for the following components:   Sodium 129 (*)    Chloride 87 (*)    BUN 68 (*)    Creatinine, Ser 10.52 (*)    Total Protein 5.5 (*)    Albumin 3.2 (*)    AST 48 (*)    GFR calc non Af Amer 3 (*)    GFR calc Af Amer 4 (*)    Anion gap 17 (*)    All other components within normal limits  CBC - Abnormal; Notable for the following components:   Hemoglobin 11.2 (*)    HCT 35.5 (*)    All other components within normal limits    EKG None  Radiology No results found.  Procedures Procedures (including critical care time)  Medications Ordered in ED Medications  HYDROmorphone (DILAUDID) injection 0.5 mg (has no administration in time range)  LORazepam (ATIVAN) injection 1 mg (has no administration in time range)  sodium chloride flush (NS) 0.9 % injection 3 mL (3 mLs Intravenous Given 02/21/18 2039)     Initial Impression / Assessment and Plan / ED Course  I have reviewed the triage vital signs and the nursing notes.  Pertinent labs & imaging results that were available during my care of the patient were reviewed by me and considered in my medical decision making (see chart for details).       Likely cystitis. Localized suprapubic tenderness. She does not have peritonitis. Catheter site looks fine. She says her diasylate has been clear.  Pt has been unable to provide urine sample.  She has ESRD an makes minimal urine. She does not want to be catheterized. Understands that she is being presumptively based on her symptoms and ideally we would at least like to obtain enough for a urine culture. Will be placed on keflex. Advised to take in after she completes dialysis.   Final Clinical Impressions(s) / ED Diagnoses   Final diagnoses:  Cystitis    ED Discharge Orders    None       Virgel Manifold, MD 02/23/18 1840

## 2018-02-21 NOTE — ED Notes (Signed)
Bladder scan indicated 40 ml urine

## 2018-02-21 NOTE — ED Triage Notes (Signed)
Pt here in the ED for having difficulty with urination for the last day. Pt states that it hurts to urinate and when she is able to go only able urinate a little bit. A&Ox4, Hx of ESRD.

## 2018-02-22 DIAGNOSIS — D631 Anemia in chronic kidney disease: Secondary | ICD-10-CM | POA: Diagnosis not present

## 2018-02-22 DIAGNOSIS — N186 End stage renal disease: Secondary | ICD-10-CM | POA: Diagnosis not present

## 2018-02-22 DIAGNOSIS — D509 Iron deficiency anemia, unspecified: Secondary | ICD-10-CM | POA: Diagnosis not present

## 2018-02-22 DIAGNOSIS — R17 Unspecified jaundice: Secondary | ICD-10-CM | POA: Diagnosis not present

## 2018-02-22 DIAGNOSIS — N2581 Secondary hyperparathyroidism of renal origin: Secondary | ICD-10-CM | POA: Diagnosis not present

## 2018-02-22 DIAGNOSIS — Z4932 Encounter for adequacy testing for peritoneal dialysis: Secondary | ICD-10-CM | POA: Diagnosis not present

## 2018-02-23 DIAGNOSIS — D509 Iron deficiency anemia, unspecified: Secondary | ICD-10-CM | POA: Diagnosis not present

## 2018-02-23 DIAGNOSIS — D631 Anemia in chronic kidney disease: Secondary | ICD-10-CM | POA: Diagnosis not present

## 2018-02-23 DIAGNOSIS — Z4932 Encounter for adequacy testing for peritoneal dialysis: Secondary | ICD-10-CM | POA: Diagnosis not present

## 2018-02-23 DIAGNOSIS — N2581 Secondary hyperparathyroidism of renal origin: Secondary | ICD-10-CM | POA: Diagnosis not present

## 2018-02-23 DIAGNOSIS — R17 Unspecified jaundice: Secondary | ICD-10-CM | POA: Diagnosis not present

## 2018-02-23 DIAGNOSIS — N186 End stage renal disease: Secondary | ICD-10-CM | POA: Diagnosis not present

## 2018-02-24 DIAGNOSIS — Z4932 Encounter for adequacy testing for peritoneal dialysis: Secondary | ICD-10-CM | POA: Diagnosis not present

## 2018-02-24 DIAGNOSIS — R17 Unspecified jaundice: Secondary | ICD-10-CM | POA: Diagnosis not present

## 2018-02-24 DIAGNOSIS — N2581 Secondary hyperparathyroidism of renal origin: Secondary | ICD-10-CM | POA: Diagnosis not present

## 2018-02-24 DIAGNOSIS — N186 End stage renal disease: Secondary | ICD-10-CM | POA: Diagnosis not present

## 2018-02-24 DIAGNOSIS — D631 Anemia in chronic kidney disease: Secondary | ICD-10-CM | POA: Diagnosis not present

## 2018-02-24 DIAGNOSIS — D509 Iron deficiency anemia, unspecified: Secondary | ICD-10-CM | POA: Diagnosis not present

## 2018-02-25 DIAGNOSIS — N186 End stage renal disease: Secondary | ICD-10-CM | POA: Diagnosis not present

## 2018-02-25 DIAGNOSIS — Z4932 Encounter for adequacy testing for peritoneal dialysis: Secondary | ICD-10-CM | POA: Diagnosis not present

## 2018-02-25 DIAGNOSIS — D631 Anemia in chronic kidney disease: Secondary | ICD-10-CM | POA: Diagnosis not present

## 2018-02-25 DIAGNOSIS — N2581 Secondary hyperparathyroidism of renal origin: Secondary | ICD-10-CM | POA: Diagnosis not present

## 2018-02-25 DIAGNOSIS — D509 Iron deficiency anemia, unspecified: Secondary | ICD-10-CM | POA: Diagnosis not present

## 2018-02-25 DIAGNOSIS — R17 Unspecified jaundice: Secondary | ICD-10-CM | POA: Diagnosis not present

## 2018-02-26 DIAGNOSIS — D631 Anemia in chronic kidney disease: Secondary | ICD-10-CM | POA: Diagnosis not present

## 2018-02-26 DIAGNOSIS — D509 Iron deficiency anemia, unspecified: Secondary | ICD-10-CM | POA: Diagnosis not present

## 2018-02-26 DIAGNOSIS — N2581 Secondary hyperparathyroidism of renal origin: Secondary | ICD-10-CM | POA: Diagnosis not present

## 2018-02-26 DIAGNOSIS — R17 Unspecified jaundice: Secondary | ICD-10-CM | POA: Diagnosis not present

## 2018-02-26 DIAGNOSIS — Z4932 Encounter for adequacy testing for peritoneal dialysis: Secondary | ICD-10-CM | POA: Diagnosis not present

## 2018-02-26 DIAGNOSIS — N186 End stage renal disease: Secondary | ICD-10-CM | POA: Diagnosis not present

## 2018-02-27 DIAGNOSIS — R17 Unspecified jaundice: Secondary | ICD-10-CM | POA: Diagnosis not present

## 2018-02-27 DIAGNOSIS — D631 Anemia in chronic kidney disease: Secondary | ICD-10-CM | POA: Diagnosis not present

## 2018-02-27 DIAGNOSIS — D509 Iron deficiency anemia, unspecified: Secondary | ICD-10-CM | POA: Diagnosis not present

## 2018-02-27 DIAGNOSIS — N2581 Secondary hyperparathyroidism of renal origin: Secondary | ICD-10-CM | POA: Diagnosis not present

## 2018-02-27 DIAGNOSIS — N186 End stage renal disease: Secondary | ICD-10-CM | POA: Diagnosis not present

## 2018-02-27 DIAGNOSIS — Z4932 Encounter for adequacy testing for peritoneal dialysis: Secondary | ICD-10-CM | POA: Diagnosis not present

## 2018-02-28 DIAGNOSIS — Z4932 Encounter for adequacy testing for peritoneal dialysis: Secondary | ICD-10-CM | POA: Diagnosis not present

## 2018-02-28 DIAGNOSIS — D631 Anemia in chronic kidney disease: Secondary | ICD-10-CM | POA: Diagnosis not present

## 2018-02-28 DIAGNOSIS — N2581 Secondary hyperparathyroidism of renal origin: Secondary | ICD-10-CM | POA: Diagnosis not present

## 2018-02-28 DIAGNOSIS — N186 End stage renal disease: Secondary | ICD-10-CM | POA: Diagnosis not present

## 2018-02-28 DIAGNOSIS — D509 Iron deficiency anemia, unspecified: Secondary | ICD-10-CM | POA: Diagnosis not present

## 2018-02-28 DIAGNOSIS — R17 Unspecified jaundice: Secondary | ICD-10-CM | POA: Diagnosis not present

## 2018-03-01 DIAGNOSIS — N2581 Secondary hyperparathyroidism of renal origin: Secondary | ICD-10-CM | POA: Diagnosis not present

## 2018-03-01 DIAGNOSIS — D509 Iron deficiency anemia, unspecified: Secondary | ICD-10-CM | POA: Diagnosis not present

## 2018-03-01 DIAGNOSIS — N186 End stage renal disease: Secondary | ICD-10-CM | POA: Diagnosis not present

## 2018-03-01 DIAGNOSIS — Z4932 Encounter for adequacy testing for peritoneal dialysis: Secondary | ICD-10-CM | POA: Diagnosis not present

## 2018-03-01 DIAGNOSIS — D631 Anemia in chronic kidney disease: Secondary | ICD-10-CM | POA: Diagnosis not present

## 2018-03-01 DIAGNOSIS — R17 Unspecified jaundice: Secondary | ICD-10-CM | POA: Diagnosis not present

## 2018-03-02 DIAGNOSIS — D631 Anemia in chronic kidney disease: Secondary | ICD-10-CM | POA: Diagnosis not present

## 2018-03-02 DIAGNOSIS — D509 Iron deficiency anemia, unspecified: Secondary | ICD-10-CM | POA: Diagnosis not present

## 2018-03-02 DIAGNOSIS — R17 Unspecified jaundice: Secondary | ICD-10-CM | POA: Diagnosis not present

## 2018-03-02 DIAGNOSIS — Z4932 Encounter for adequacy testing for peritoneal dialysis: Secondary | ICD-10-CM | POA: Diagnosis not present

## 2018-03-02 DIAGNOSIS — N186 End stage renal disease: Secondary | ICD-10-CM | POA: Diagnosis not present

## 2018-03-02 DIAGNOSIS — N2581 Secondary hyperparathyroidism of renal origin: Secondary | ICD-10-CM | POA: Diagnosis not present

## 2018-03-03 DIAGNOSIS — Z4932 Encounter for adequacy testing for peritoneal dialysis: Secondary | ICD-10-CM | POA: Diagnosis not present

## 2018-03-03 DIAGNOSIS — D509 Iron deficiency anemia, unspecified: Secondary | ICD-10-CM | POA: Diagnosis not present

## 2018-03-03 DIAGNOSIS — R17 Unspecified jaundice: Secondary | ICD-10-CM | POA: Diagnosis not present

## 2018-03-03 DIAGNOSIS — D631 Anemia in chronic kidney disease: Secondary | ICD-10-CM | POA: Diagnosis not present

## 2018-03-03 DIAGNOSIS — N2581 Secondary hyperparathyroidism of renal origin: Secondary | ICD-10-CM | POA: Diagnosis not present

## 2018-03-03 DIAGNOSIS — N186 End stage renal disease: Secondary | ICD-10-CM | POA: Diagnosis not present

## 2018-03-04 DIAGNOSIS — D509 Iron deficiency anemia, unspecified: Secondary | ICD-10-CM | POA: Diagnosis not present

## 2018-03-04 DIAGNOSIS — Z4932 Encounter for adequacy testing for peritoneal dialysis: Secondary | ICD-10-CM | POA: Diagnosis not present

## 2018-03-04 DIAGNOSIS — R17 Unspecified jaundice: Secondary | ICD-10-CM | POA: Diagnosis not present

## 2018-03-04 DIAGNOSIS — N186 End stage renal disease: Secondary | ICD-10-CM | POA: Diagnosis not present

## 2018-03-04 DIAGNOSIS — N2581 Secondary hyperparathyroidism of renal origin: Secondary | ICD-10-CM | POA: Diagnosis not present

## 2018-03-04 DIAGNOSIS — D631 Anemia in chronic kidney disease: Secondary | ICD-10-CM | POA: Diagnosis not present

## 2018-03-05 DIAGNOSIS — N2581 Secondary hyperparathyroidism of renal origin: Secondary | ICD-10-CM | POA: Diagnosis not present

## 2018-03-05 DIAGNOSIS — N186 End stage renal disease: Secondary | ICD-10-CM | POA: Diagnosis not present

## 2018-03-05 DIAGNOSIS — D509 Iron deficiency anemia, unspecified: Secondary | ICD-10-CM | POA: Diagnosis not present

## 2018-03-05 DIAGNOSIS — R17 Unspecified jaundice: Secondary | ICD-10-CM | POA: Diagnosis not present

## 2018-03-05 DIAGNOSIS — Z79899 Other long term (current) drug therapy: Secondary | ICD-10-CM | POA: Diagnosis not present

## 2018-03-05 DIAGNOSIS — E440 Moderate protein-calorie malnutrition: Secondary | ICD-10-CM | POA: Diagnosis not present

## 2018-03-05 DIAGNOSIS — D631 Anemia in chronic kidney disease: Secondary | ICD-10-CM | POA: Diagnosis not present

## 2018-03-05 DIAGNOSIS — Z4932 Encounter for adequacy testing for peritoneal dialysis: Secondary | ICD-10-CM | POA: Diagnosis not present

## 2018-03-06 DIAGNOSIS — N186 End stage renal disease: Secondary | ICD-10-CM | POA: Diagnosis not present

## 2018-03-06 DIAGNOSIS — D509 Iron deficiency anemia, unspecified: Secondary | ICD-10-CM | POA: Diagnosis not present

## 2018-03-06 DIAGNOSIS — R17 Unspecified jaundice: Secondary | ICD-10-CM | POA: Diagnosis not present

## 2018-03-06 DIAGNOSIS — Z4932 Encounter for adequacy testing for peritoneal dialysis: Secondary | ICD-10-CM | POA: Diagnosis not present

## 2018-03-06 DIAGNOSIS — N2581 Secondary hyperparathyroidism of renal origin: Secondary | ICD-10-CM | POA: Diagnosis not present

## 2018-03-06 DIAGNOSIS — D631 Anemia in chronic kidney disease: Secondary | ICD-10-CM | POA: Diagnosis not present

## 2018-03-07 DIAGNOSIS — N2581 Secondary hyperparathyroidism of renal origin: Secondary | ICD-10-CM | POA: Diagnosis not present

## 2018-03-07 DIAGNOSIS — R17 Unspecified jaundice: Secondary | ICD-10-CM | POA: Diagnosis not present

## 2018-03-07 DIAGNOSIS — D509 Iron deficiency anemia, unspecified: Secondary | ICD-10-CM | POA: Diagnosis not present

## 2018-03-07 DIAGNOSIS — N186 End stage renal disease: Secondary | ICD-10-CM | POA: Diagnosis not present

## 2018-03-07 DIAGNOSIS — Z4932 Encounter for adequacy testing for peritoneal dialysis: Secondary | ICD-10-CM | POA: Diagnosis not present

## 2018-03-07 DIAGNOSIS — D631 Anemia in chronic kidney disease: Secondary | ICD-10-CM | POA: Diagnosis not present

## 2018-03-08 DIAGNOSIS — D631 Anemia in chronic kidney disease: Secondary | ICD-10-CM | POA: Diagnosis not present

## 2018-03-08 DIAGNOSIS — D509 Iron deficiency anemia, unspecified: Secondary | ICD-10-CM | POA: Diagnosis not present

## 2018-03-08 DIAGNOSIS — N186 End stage renal disease: Secondary | ICD-10-CM | POA: Diagnosis not present

## 2018-03-08 DIAGNOSIS — R17 Unspecified jaundice: Secondary | ICD-10-CM | POA: Diagnosis not present

## 2018-03-08 DIAGNOSIS — N2581 Secondary hyperparathyroidism of renal origin: Secondary | ICD-10-CM | POA: Diagnosis not present

## 2018-03-08 DIAGNOSIS — Z4932 Encounter for adequacy testing for peritoneal dialysis: Secondary | ICD-10-CM | POA: Diagnosis not present

## 2018-03-09 DIAGNOSIS — R17 Unspecified jaundice: Secondary | ICD-10-CM | POA: Diagnosis not present

## 2018-03-09 DIAGNOSIS — Z4932 Encounter for adequacy testing for peritoneal dialysis: Secondary | ICD-10-CM | POA: Diagnosis not present

## 2018-03-09 DIAGNOSIS — N186 End stage renal disease: Secondary | ICD-10-CM | POA: Diagnosis not present

## 2018-03-09 DIAGNOSIS — D631 Anemia in chronic kidney disease: Secondary | ICD-10-CM | POA: Diagnosis not present

## 2018-03-09 DIAGNOSIS — D509 Iron deficiency anemia, unspecified: Secondary | ICD-10-CM | POA: Diagnosis not present

## 2018-03-09 DIAGNOSIS — N2581 Secondary hyperparathyroidism of renal origin: Secondary | ICD-10-CM | POA: Diagnosis not present

## 2018-03-10 DIAGNOSIS — R82998 Other abnormal findings in urine: Secondary | ICD-10-CM | POA: Diagnosis not present

## 2018-03-10 DIAGNOSIS — D631 Anemia in chronic kidney disease: Secondary | ICD-10-CM | POA: Diagnosis not present

## 2018-03-10 DIAGNOSIS — N2581 Secondary hyperparathyroidism of renal origin: Secondary | ICD-10-CM | POA: Diagnosis not present

## 2018-03-10 DIAGNOSIS — R17 Unspecified jaundice: Secondary | ICD-10-CM | POA: Diagnosis not present

## 2018-03-10 DIAGNOSIS — Z4932 Encounter for adequacy testing for peritoneal dialysis: Secondary | ICD-10-CM | POA: Diagnosis not present

## 2018-03-10 DIAGNOSIS — D509 Iron deficiency anemia, unspecified: Secondary | ICD-10-CM | POA: Diagnosis not present

## 2018-03-10 DIAGNOSIS — N186 End stage renal disease: Secondary | ICD-10-CM | POA: Diagnosis not present

## 2018-03-11 DIAGNOSIS — D631 Anemia in chronic kidney disease: Secondary | ICD-10-CM | POA: Diagnosis not present

## 2018-03-11 DIAGNOSIS — D509 Iron deficiency anemia, unspecified: Secondary | ICD-10-CM | POA: Diagnosis not present

## 2018-03-11 DIAGNOSIS — R17 Unspecified jaundice: Secondary | ICD-10-CM | POA: Diagnosis not present

## 2018-03-11 DIAGNOSIS — N2581 Secondary hyperparathyroidism of renal origin: Secondary | ICD-10-CM | POA: Diagnosis not present

## 2018-03-11 DIAGNOSIS — Z4932 Encounter for adequacy testing for peritoneal dialysis: Secondary | ICD-10-CM | POA: Diagnosis not present

## 2018-03-11 DIAGNOSIS — N186 End stage renal disease: Secondary | ICD-10-CM | POA: Diagnosis not present

## 2018-03-12 DIAGNOSIS — Z4932 Encounter for adequacy testing for peritoneal dialysis: Secondary | ICD-10-CM | POA: Diagnosis not present

## 2018-03-12 DIAGNOSIS — N2581 Secondary hyperparathyroidism of renal origin: Secondary | ICD-10-CM | POA: Diagnosis not present

## 2018-03-12 DIAGNOSIS — D509 Iron deficiency anemia, unspecified: Secondary | ICD-10-CM | POA: Diagnosis not present

## 2018-03-12 DIAGNOSIS — R17 Unspecified jaundice: Secondary | ICD-10-CM | POA: Diagnosis not present

## 2018-03-12 DIAGNOSIS — D631 Anemia in chronic kidney disease: Secondary | ICD-10-CM | POA: Diagnosis not present

## 2018-03-12 DIAGNOSIS — N186 End stage renal disease: Secondary | ICD-10-CM | POA: Diagnosis not present

## 2018-03-13 DIAGNOSIS — R17 Unspecified jaundice: Secondary | ICD-10-CM | POA: Diagnosis not present

## 2018-03-13 DIAGNOSIS — N2581 Secondary hyperparathyroidism of renal origin: Secondary | ICD-10-CM | POA: Diagnosis not present

## 2018-03-13 DIAGNOSIS — Z4932 Encounter for adequacy testing for peritoneal dialysis: Secondary | ICD-10-CM | POA: Diagnosis not present

## 2018-03-13 DIAGNOSIS — D631 Anemia in chronic kidney disease: Secondary | ICD-10-CM | POA: Diagnosis not present

## 2018-03-13 DIAGNOSIS — N186 End stage renal disease: Secondary | ICD-10-CM | POA: Diagnosis not present

## 2018-03-13 DIAGNOSIS — D509 Iron deficiency anemia, unspecified: Secondary | ICD-10-CM | POA: Diagnosis not present

## 2018-03-14 DIAGNOSIS — N186 End stage renal disease: Secondary | ICD-10-CM | POA: Diagnosis not present

## 2018-03-14 DIAGNOSIS — R17 Unspecified jaundice: Secondary | ICD-10-CM | POA: Diagnosis not present

## 2018-03-14 DIAGNOSIS — D509 Iron deficiency anemia, unspecified: Secondary | ICD-10-CM | POA: Diagnosis not present

## 2018-03-14 DIAGNOSIS — N2581 Secondary hyperparathyroidism of renal origin: Secondary | ICD-10-CM | POA: Diagnosis not present

## 2018-03-14 DIAGNOSIS — D631 Anemia in chronic kidney disease: Secondary | ICD-10-CM | POA: Diagnosis not present

## 2018-03-14 DIAGNOSIS — Z4932 Encounter for adequacy testing for peritoneal dialysis: Secondary | ICD-10-CM | POA: Diagnosis not present

## 2018-03-15 DIAGNOSIS — Z4932 Encounter for adequacy testing for peritoneal dialysis: Secondary | ICD-10-CM | POA: Diagnosis not present

## 2018-03-15 DIAGNOSIS — N2581 Secondary hyperparathyroidism of renal origin: Secondary | ICD-10-CM | POA: Diagnosis not present

## 2018-03-15 DIAGNOSIS — D631 Anemia in chronic kidney disease: Secondary | ICD-10-CM | POA: Diagnosis not present

## 2018-03-15 DIAGNOSIS — R17 Unspecified jaundice: Secondary | ICD-10-CM | POA: Diagnosis not present

## 2018-03-15 DIAGNOSIS — D509 Iron deficiency anemia, unspecified: Secondary | ICD-10-CM | POA: Diagnosis not present

## 2018-03-15 DIAGNOSIS — N186 End stage renal disease: Secondary | ICD-10-CM | POA: Diagnosis not present

## 2018-03-16 DIAGNOSIS — N186 End stage renal disease: Secondary | ICD-10-CM | POA: Diagnosis not present

## 2018-03-16 DIAGNOSIS — D631 Anemia in chronic kidney disease: Secondary | ICD-10-CM | POA: Diagnosis not present

## 2018-03-16 DIAGNOSIS — R17 Unspecified jaundice: Secondary | ICD-10-CM | POA: Diagnosis not present

## 2018-03-16 DIAGNOSIS — D509 Iron deficiency anemia, unspecified: Secondary | ICD-10-CM | POA: Diagnosis not present

## 2018-03-16 DIAGNOSIS — Z4932 Encounter for adequacy testing for peritoneal dialysis: Secondary | ICD-10-CM | POA: Diagnosis not present

## 2018-03-16 DIAGNOSIS — N2581 Secondary hyperparathyroidism of renal origin: Secondary | ICD-10-CM | POA: Diagnosis not present

## 2018-03-17 DIAGNOSIS — Z4932 Encounter for adequacy testing for peritoneal dialysis: Secondary | ICD-10-CM | POA: Diagnosis not present

## 2018-03-17 DIAGNOSIS — N2581 Secondary hyperparathyroidism of renal origin: Secondary | ICD-10-CM | POA: Diagnosis not present

## 2018-03-17 DIAGNOSIS — D509 Iron deficiency anemia, unspecified: Secondary | ICD-10-CM | POA: Diagnosis not present

## 2018-03-17 DIAGNOSIS — D631 Anemia in chronic kidney disease: Secondary | ICD-10-CM | POA: Diagnosis not present

## 2018-03-17 DIAGNOSIS — R17 Unspecified jaundice: Secondary | ICD-10-CM | POA: Diagnosis not present

## 2018-03-17 DIAGNOSIS — N186 End stage renal disease: Secondary | ICD-10-CM | POA: Diagnosis not present

## 2018-03-18 DIAGNOSIS — Z4932 Encounter for adequacy testing for peritoneal dialysis: Secondary | ICD-10-CM | POA: Diagnosis not present

## 2018-03-18 DIAGNOSIS — D509 Iron deficiency anemia, unspecified: Secondary | ICD-10-CM | POA: Diagnosis not present

## 2018-03-18 DIAGNOSIS — N2581 Secondary hyperparathyroidism of renal origin: Secondary | ICD-10-CM | POA: Diagnosis not present

## 2018-03-18 DIAGNOSIS — D631 Anemia in chronic kidney disease: Secondary | ICD-10-CM | POA: Diagnosis not present

## 2018-03-18 DIAGNOSIS — R17 Unspecified jaundice: Secondary | ICD-10-CM | POA: Diagnosis not present

## 2018-03-18 DIAGNOSIS — N186 End stage renal disease: Secondary | ICD-10-CM | POA: Diagnosis not present

## 2018-03-19 DIAGNOSIS — D631 Anemia in chronic kidney disease: Secondary | ICD-10-CM | POA: Diagnosis not present

## 2018-03-19 DIAGNOSIS — D509 Iron deficiency anemia, unspecified: Secondary | ICD-10-CM | POA: Diagnosis not present

## 2018-03-19 DIAGNOSIS — N186 End stage renal disease: Secondary | ICD-10-CM | POA: Diagnosis not present

## 2018-03-19 DIAGNOSIS — N2581 Secondary hyperparathyroidism of renal origin: Secondary | ICD-10-CM | POA: Diagnosis not present

## 2018-03-19 DIAGNOSIS — Z4932 Encounter for adequacy testing for peritoneal dialysis: Secondary | ICD-10-CM | POA: Diagnosis not present

## 2018-03-19 DIAGNOSIS — R17 Unspecified jaundice: Secondary | ICD-10-CM | POA: Diagnosis not present

## 2018-03-20 DIAGNOSIS — N186 End stage renal disease: Secondary | ICD-10-CM | POA: Diagnosis not present

## 2018-03-20 DIAGNOSIS — Z4932 Encounter for adequacy testing for peritoneal dialysis: Secondary | ICD-10-CM | POA: Diagnosis not present

## 2018-03-20 DIAGNOSIS — D509 Iron deficiency anemia, unspecified: Secondary | ICD-10-CM | POA: Diagnosis not present

## 2018-03-20 DIAGNOSIS — Z0001 Encounter for general adult medical examination with abnormal findings: Secondary | ICD-10-CM | POA: Diagnosis not present

## 2018-03-20 DIAGNOSIS — R17 Unspecified jaundice: Secondary | ICD-10-CM | POA: Diagnosis not present

## 2018-03-20 DIAGNOSIS — N2581 Secondary hyperparathyroidism of renal origin: Secondary | ICD-10-CM | POA: Diagnosis not present

## 2018-03-20 DIAGNOSIS — M069 Rheumatoid arthritis, unspecified: Secondary | ICD-10-CM | POA: Diagnosis not present

## 2018-03-20 DIAGNOSIS — I10 Essential (primary) hypertension: Secondary | ICD-10-CM | POA: Diagnosis not present

## 2018-03-20 DIAGNOSIS — D631 Anemia in chronic kidney disease: Secondary | ICD-10-CM | POA: Diagnosis not present

## 2018-03-20 DIAGNOSIS — D593 Hemolytic-uremic syndrome: Secondary | ICD-10-CM | POA: Diagnosis not present

## 2018-03-21 DIAGNOSIS — N2581 Secondary hyperparathyroidism of renal origin: Secondary | ICD-10-CM | POA: Diagnosis not present

## 2018-03-21 DIAGNOSIS — Z4932 Encounter for adequacy testing for peritoneal dialysis: Secondary | ICD-10-CM | POA: Diagnosis not present

## 2018-03-21 DIAGNOSIS — R17 Unspecified jaundice: Secondary | ICD-10-CM | POA: Diagnosis not present

## 2018-03-21 DIAGNOSIS — N186 End stage renal disease: Secondary | ICD-10-CM | POA: Diagnosis not present

## 2018-03-21 DIAGNOSIS — D509 Iron deficiency anemia, unspecified: Secondary | ICD-10-CM | POA: Diagnosis not present

## 2018-03-21 DIAGNOSIS — D631 Anemia in chronic kidney disease: Secondary | ICD-10-CM | POA: Diagnosis not present

## 2018-03-22 DIAGNOSIS — Z4932 Encounter for adequacy testing for peritoneal dialysis: Secondary | ICD-10-CM | POA: Diagnosis not present

## 2018-03-22 DIAGNOSIS — D509 Iron deficiency anemia, unspecified: Secondary | ICD-10-CM | POA: Diagnosis not present

## 2018-03-22 DIAGNOSIS — D631 Anemia in chronic kidney disease: Secondary | ICD-10-CM | POA: Diagnosis not present

## 2018-03-22 DIAGNOSIS — R17 Unspecified jaundice: Secondary | ICD-10-CM | POA: Diagnosis not present

## 2018-03-22 DIAGNOSIS — N2581 Secondary hyperparathyroidism of renal origin: Secondary | ICD-10-CM | POA: Diagnosis not present

## 2018-03-22 DIAGNOSIS — N186 End stage renal disease: Secondary | ICD-10-CM | POA: Diagnosis not present

## 2018-03-23 DIAGNOSIS — N2581 Secondary hyperparathyroidism of renal origin: Secondary | ICD-10-CM | POA: Diagnosis not present

## 2018-03-23 DIAGNOSIS — N186 End stage renal disease: Secondary | ICD-10-CM | POA: Diagnosis not present

## 2018-03-23 DIAGNOSIS — Z4932 Encounter for adequacy testing for peritoneal dialysis: Secondary | ICD-10-CM | POA: Diagnosis not present

## 2018-03-23 DIAGNOSIS — R17 Unspecified jaundice: Secondary | ICD-10-CM | POA: Diagnosis not present

## 2018-03-23 DIAGNOSIS — D631 Anemia in chronic kidney disease: Secondary | ICD-10-CM | POA: Diagnosis not present

## 2018-03-23 DIAGNOSIS — D509 Iron deficiency anemia, unspecified: Secondary | ICD-10-CM | POA: Diagnosis not present

## 2018-03-24 DIAGNOSIS — D631 Anemia in chronic kidney disease: Secondary | ICD-10-CM | POA: Diagnosis not present

## 2018-03-24 DIAGNOSIS — Z4932 Encounter for adequacy testing for peritoneal dialysis: Secondary | ICD-10-CM | POA: Diagnosis not present

## 2018-03-24 DIAGNOSIS — N2581 Secondary hyperparathyroidism of renal origin: Secondary | ICD-10-CM | POA: Diagnosis not present

## 2018-03-24 DIAGNOSIS — R17 Unspecified jaundice: Secondary | ICD-10-CM | POA: Diagnosis not present

## 2018-03-24 DIAGNOSIS — N186 End stage renal disease: Secondary | ICD-10-CM | POA: Diagnosis not present

## 2018-03-24 DIAGNOSIS — D509 Iron deficiency anemia, unspecified: Secondary | ICD-10-CM | POA: Diagnosis not present

## 2018-03-25 DIAGNOSIS — R17 Unspecified jaundice: Secondary | ICD-10-CM | POA: Diagnosis not present

## 2018-03-25 DIAGNOSIS — N2581 Secondary hyperparathyroidism of renal origin: Secondary | ICD-10-CM | POA: Diagnosis not present

## 2018-03-25 DIAGNOSIS — N186 End stage renal disease: Secondary | ICD-10-CM | POA: Diagnosis not present

## 2018-03-25 DIAGNOSIS — D631 Anemia in chronic kidney disease: Secondary | ICD-10-CM | POA: Diagnosis not present

## 2018-03-25 DIAGNOSIS — D509 Iron deficiency anemia, unspecified: Secondary | ICD-10-CM | POA: Diagnosis not present

## 2018-03-25 DIAGNOSIS — Z4932 Encounter for adequacy testing for peritoneal dialysis: Secondary | ICD-10-CM | POA: Diagnosis not present

## 2018-03-26 DIAGNOSIS — Z4932 Encounter for adequacy testing for peritoneal dialysis: Secondary | ICD-10-CM | POA: Diagnosis not present

## 2018-03-26 DIAGNOSIS — R17 Unspecified jaundice: Secondary | ICD-10-CM | POA: Diagnosis not present

## 2018-03-26 DIAGNOSIS — D509 Iron deficiency anemia, unspecified: Secondary | ICD-10-CM | POA: Diagnosis not present

## 2018-03-26 DIAGNOSIS — N186 End stage renal disease: Secondary | ICD-10-CM | POA: Diagnosis not present

## 2018-03-26 DIAGNOSIS — D631 Anemia in chronic kidney disease: Secondary | ICD-10-CM | POA: Diagnosis not present

## 2018-03-26 DIAGNOSIS — N2581 Secondary hyperparathyroidism of renal origin: Secondary | ICD-10-CM | POA: Diagnosis not present

## 2018-03-27 DIAGNOSIS — Z4932 Encounter for adequacy testing for peritoneal dialysis: Secondary | ICD-10-CM | POA: Diagnosis not present

## 2018-03-27 DIAGNOSIS — D631 Anemia in chronic kidney disease: Secondary | ICD-10-CM | POA: Diagnosis not present

## 2018-03-27 DIAGNOSIS — D509 Iron deficiency anemia, unspecified: Secondary | ICD-10-CM | POA: Diagnosis not present

## 2018-03-27 DIAGNOSIS — R17 Unspecified jaundice: Secondary | ICD-10-CM | POA: Diagnosis not present

## 2018-03-27 DIAGNOSIS — N2581 Secondary hyperparathyroidism of renal origin: Secondary | ICD-10-CM | POA: Diagnosis not present

## 2018-03-27 DIAGNOSIS — N186 End stage renal disease: Secondary | ICD-10-CM | POA: Diagnosis not present

## 2018-03-28 DIAGNOSIS — Z4932 Encounter for adequacy testing for peritoneal dialysis: Secondary | ICD-10-CM | POA: Diagnosis not present

## 2018-03-28 DIAGNOSIS — D509 Iron deficiency anemia, unspecified: Secondary | ICD-10-CM | POA: Diagnosis not present

## 2018-03-28 DIAGNOSIS — D631 Anemia in chronic kidney disease: Secondary | ICD-10-CM | POA: Diagnosis not present

## 2018-03-28 DIAGNOSIS — N186 End stage renal disease: Secondary | ICD-10-CM | POA: Diagnosis not present

## 2018-03-28 DIAGNOSIS — R17 Unspecified jaundice: Secondary | ICD-10-CM | POA: Diagnosis not present

## 2018-03-28 DIAGNOSIS — N2581 Secondary hyperparathyroidism of renal origin: Secondary | ICD-10-CM | POA: Diagnosis not present

## 2018-03-29 DIAGNOSIS — N186 End stage renal disease: Secondary | ICD-10-CM | POA: Diagnosis not present

## 2018-03-29 DIAGNOSIS — D509 Iron deficiency anemia, unspecified: Secondary | ICD-10-CM | POA: Diagnosis not present

## 2018-03-29 DIAGNOSIS — D631 Anemia in chronic kidney disease: Secondary | ICD-10-CM | POA: Diagnosis not present

## 2018-03-29 DIAGNOSIS — Z4932 Encounter for adequacy testing for peritoneal dialysis: Secondary | ICD-10-CM | POA: Diagnosis not present

## 2018-03-29 DIAGNOSIS — R17 Unspecified jaundice: Secondary | ICD-10-CM | POA: Diagnosis not present

## 2018-03-29 DIAGNOSIS — N2581 Secondary hyperparathyroidism of renal origin: Secondary | ICD-10-CM | POA: Diagnosis not present

## 2018-03-30 DIAGNOSIS — N186 End stage renal disease: Secondary | ICD-10-CM | POA: Diagnosis not present

## 2018-03-30 DIAGNOSIS — Z4932 Encounter for adequacy testing for peritoneal dialysis: Secondary | ICD-10-CM | POA: Diagnosis not present

## 2018-03-30 DIAGNOSIS — D509 Iron deficiency anemia, unspecified: Secondary | ICD-10-CM | POA: Diagnosis not present

## 2018-03-30 DIAGNOSIS — R17 Unspecified jaundice: Secondary | ICD-10-CM | POA: Diagnosis not present

## 2018-03-30 DIAGNOSIS — D631 Anemia in chronic kidney disease: Secondary | ICD-10-CM | POA: Diagnosis not present

## 2018-03-30 DIAGNOSIS — N2581 Secondary hyperparathyroidism of renal origin: Secondary | ICD-10-CM | POA: Diagnosis not present

## 2018-03-31 DIAGNOSIS — N186 End stage renal disease: Secondary | ICD-10-CM | POA: Diagnosis not present

## 2018-03-31 DIAGNOSIS — Z4932 Encounter for adequacy testing for peritoneal dialysis: Secondary | ICD-10-CM | POA: Diagnosis not present

## 2018-03-31 DIAGNOSIS — R17 Unspecified jaundice: Secondary | ICD-10-CM | POA: Diagnosis not present

## 2018-03-31 DIAGNOSIS — D631 Anemia in chronic kidney disease: Secondary | ICD-10-CM | POA: Diagnosis not present

## 2018-03-31 DIAGNOSIS — N2581 Secondary hyperparathyroidism of renal origin: Secondary | ICD-10-CM | POA: Diagnosis not present

## 2018-03-31 DIAGNOSIS — D509 Iron deficiency anemia, unspecified: Secondary | ICD-10-CM | POA: Diagnosis not present

## 2018-04-01 DIAGNOSIS — N2581 Secondary hyperparathyroidism of renal origin: Secondary | ICD-10-CM | POA: Diagnosis not present

## 2018-04-01 DIAGNOSIS — D631 Anemia in chronic kidney disease: Secondary | ICD-10-CM | POA: Diagnosis not present

## 2018-04-01 DIAGNOSIS — Z4932 Encounter for adequacy testing for peritoneal dialysis: Secondary | ICD-10-CM | POA: Diagnosis not present

## 2018-04-01 DIAGNOSIS — N186 End stage renal disease: Secondary | ICD-10-CM | POA: Diagnosis not present

## 2018-04-01 DIAGNOSIS — D509 Iron deficiency anemia, unspecified: Secondary | ICD-10-CM | POA: Diagnosis not present

## 2018-04-01 DIAGNOSIS — R17 Unspecified jaundice: Secondary | ICD-10-CM | POA: Diagnosis not present

## 2018-04-02 DIAGNOSIS — N2581 Secondary hyperparathyroidism of renal origin: Secondary | ICD-10-CM | POA: Diagnosis not present

## 2018-04-02 DIAGNOSIS — N186 End stage renal disease: Secondary | ICD-10-CM | POA: Diagnosis not present

## 2018-04-02 DIAGNOSIS — R17 Unspecified jaundice: Secondary | ICD-10-CM | POA: Diagnosis not present

## 2018-04-02 DIAGNOSIS — D509 Iron deficiency anemia, unspecified: Secondary | ICD-10-CM | POA: Diagnosis not present

## 2018-04-02 DIAGNOSIS — Z4932 Encounter for adequacy testing for peritoneal dialysis: Secondary | ICD-10-CM | POA: Diagnosis not present

## 2018-04-02 DIAGNOSIS — D631 Anemia in chronic kidney disease: Secondary | ICD-10-CM | POA: Diagnosis not present

## 2018-04-03 DIAGNOSIS — Z4932 Encounter for adequacy testing for peritoneal dialysis: Secondary | ICD-10-CM | POA: Diagnosis not present

## 2018-04-03 DIAGNOSIS — N186 End stage renal disease: Secondary | ICD-10-CM | POA: Diagnosis not present

## 2018-04-03 DIAGNOSIS — D509 Iron deficiency anemia, unspecified: Secondary | ICD-10-CM | POA: Diagnosis not present

## 2018-04-03 DIAGNOSIS — N2581 Secondary hyperparathyroidism of renal origin: Secondary | ICD-10-CM | POA: Diagnosis not present

## 2018-04-03 DIAGNOSIS — R17 Unspecified jaundice: Secondary | ICD-10-CM | POA: Diagnosis not present

## 2018-04-03 DIAGNOSIS — D631 Anemia in chronic kidney disease: Secondary | ICD-10-CM | POA: Diagnosis not present

## 2018-04-04 DIAGNOSIS — N2581 Secondary hyperparathyroidism of renal origin: Secondary | ICD-10-CM | POA: Diagnosis not present

## 2018-04-04 DIAGNOSIS — D509 Iron deficiency anemia, unspecified: Secondary | ICD-10-CM | POA: Diagnosis not present

## 2018-04-04 DIAGNOSIS — R17 Unspecified jaundice: Secondary | ICD-10-CM | POA: Diagnosis not present

## 2018-04-04 DIAGNOSIS — M311 Thrombotic microangiopathy: Secondary | ICD-10-CM | POA: Diagnosis not present

## 2018-04-04 DIAGNOSIS — D631 Anemia in chronic kidney disease: Secondary | ICD-10-CM | POA: Diagnosis not present

## 2018-04-04 DIAGNOSIS — Z992 Dependence on renal dialysis: Secondary | ICD-10-CM | POA: Diagnosis not present

## 2018-04-04 DIAGNOSIS — Z4932 Encounter for adequacy testing for peritoneal dialysis: Secondary | ICD-10-CM | POA: Diagnosis not present

## 2018-04-04 DIAGNOSIS — N186 End stage renal disease: Secondary | ICD-10-CM | POA: Diagnosis not present

## 2018-04-05 DIAGNOSIS — N2581 Secondary hyperparathyroidism of renal origin: Secondary | ICD-10-CM | POA: Diagnosis not present

## 2018-04-05 DIAGNOSIS — D631 Anemia in chronic kidney disease: Secondary | ICD-10-CM | POA: Diagnosis not present

## 2018-04-05 DIAGNOSIS — D509 Iron deficiency anemia, unspecified: Secondary | ICD-10-CM | POA: Diagnosis not present

## 2018-04-05 DIAGNOSIS — Z4932 Encounter for adequacy testing for peritoneal dialysis: Secondary | ICD-10-CM | POA: Diagnosis not present

## 2018-04-05 DIAGNOSIS — N2589 Other disorders resulting from impaired renal tubular function: Secondary | ICD-10-CM | POA: Diagnosis not present

## 2018-04-05 DIAGNOSIS — N186 End stage renal disease: Secondary | ICD-10-CM | POA: Diagnosis not present

## 2018-04-05 DIAGNOSIS — R82998 Other abnormal findings in urine: Secondary | ICD-10-CM | POA: Diagnosis not present

## 2018-04-06 DIAGNOSIS — N186 End stage renal disease: Secondary | ICD-10-CM | POA: Diagnosis not present

## 2018-04-06 DIAGNOSIS — D509 Iron deficiency anemia, unspecified: Secondary | ICD-10-CM | POA: Diagnosis not present

## 2018-04-06 DIAGNOSIS — Z4932 Encounter for adequacy testing for peritoneal dialysis: Secondary | ICD-10-CM | POA: Diagnosis not present

## 2018-04-06 DIAGNOSIS — N2581 Secondary hyperparathyroidism of renal origin: Secondary | ICD-10-CM | POA: Diagnosis not present

## 2018-04-06 DIAGNOSIS — D631 Anemia in chronic kidney disease: Secondary | ICD-10-CM | POA: Diagnosis not present

## 2018-04-06 DIAGNOSIS — N2589 Other disorders resulting from impaired renal tubular function: Secondary | ICD-10-CM | POA: Diagnosis not present

## 2018-04-07 DIAGNOSIS — D509 Iron deficiency anemia, unspecified: Secondary | ICD-10-CM | POA: Diagnosis not present

## 2018-04-07 DIAGNOSIS — N186 End stage renal disease: Secondary | ICD-10-CM | POA: Diagnosis not present

## 2018-04-07 DIAGNOSIS — Z4932 Encounter for adequacy testing for peritoneal dialysis: Secondary | ICD-10-CM | POA: Diagnosis not present

## 2018-04-07 DIAGNOSIS — N2581 Secondary hyperparathyroidism of renal origin: Secondary | ICD-10-CM | POA: Diagnosis not present

## 2018-04-07 DIAGNOSIS — D631 Anemia in chronic kidney disease: Secondary | ICD-10-CM | POA: Diagnosis not present

## 2018-04-07 DIAGNOSIS — N2589 Other disorders resulting from impaired renal tubular function: Secondary | ICD-10-CM | POA: Diagnosis not present

## 2018-04-08 DIAGNOSIS — N2589 Other disorders resulting from impaired renal tubular function: Secondary | ICD-10-CM | POA: Diagnosis not present

## 2018-04-08 DIAGNOSIS — D509 Iron deficiency anemia, unspecified: Secondary | ICD-10-CM | POA: Diagnosis not present

## 2018-04-08 DIAGNOSIS — N186 End stage renal disease: Secondary | ICD-10-CM | POA: Diagnosis not present

## 2018-04-08 DIAGNOSIS — Z4932 Encounter for adequacy testing for peritoneal dialysis: Secondary | ICD-10-CM | POA: Diagnosis not present

## 2018-04-08 DIAGNOSIS — N2581 Secondary hyperparathyroidism of renal origin: Secondary | ICD-10-CM | POA: Diagnosis not present

## 2018-04-08 DIAGNOSIS — D631 Anemia in chronic kidney disease: Secondary | ICD-10-CM | POA: Diagnosis not present

## 2018-04-09 DIAGNOSIS — N2581 Secondary hyperparathyroidism of renal origin: Secondary | ICD-10-CM | POA: Diagnosis not present

## 2018-04-09 DIAGNOSIS — Z4932 Encounter for adequacy testing for peritoneal dialysis: Secondary | ICD-10-CM | POA: Diagnosis not present

## 2018-04-09 DIAGNOSIS — D509 Iron deficiency anemia, unspecified: Secondary | ICD-10-CM | POA: Diagnosis not present

## 2018-04-09 DIAGNOSIS — N186 End stage renal disease: Secondary | ICD-10-CM | POA: Diagnosis not present

## 2018-04-09 DIAGNOSIS — D631 Anemia in chronic kidney disease: Secondary | ICD-10-CM | POA: Diagnosis not present

## 2018-04-09 DIAGNOSIS — N2589 Other disorders resulting from impaired renal tubular function: Secondary | ICD-10-CM | POA: Diagnosis not present

## 2018-04-10 DIAGNOSIS — N2581 Secondary hyperparathyroidism of renal origin: Secondary | ICD-10-CM | POA: Diagnosis not present

## 2018-04-10 DIAGNOSIS — Z4932 Encounter for adequacy testing for peritoneal dialysis: Secondary | ICD-10-CM | POA: Diagnosis not present

## 2018-04-10 DIAGNOSIS — N2589 Other disorders resulting from impaired renal tubular function: Secondary | ICD-10-CM | POA: Diagnosis not present

## 2018-04-10 DIAGNOSIS — D631 Anemia in chronic kidney disease: Secondary | ICD-10-CM | POA: Diagnosis not present

## 2018-04-10 DIAGNOSIS — D509 Iron deficiency anemia, unspecified: Secondary | ICD-10-CM | POA: Diagnosis not present

## 2018-04-10 DIAGNOSIS — N186 End stage renal disease: Secondary | ICD-10-CM | POA: Diagnosis not present

## 2018-04-11 DIAGNOSIS — D509 Iron deficiency anemia, unspecified: Secondary | ICD-10-CM | POA: Diagnosis not present

## 2018-04-11 DIAGNOSIS — N186 End stage renal disease: Secondary | ICD-10-CM | POA: Diagnosis not present

## 2018-04-11 DIAGNOSIS — N2589 Other disorders resulting from impaired renal tubular function: Secondary | ICD-10-CM | POA: Diagnosis not present

## 2018-04-11 DIAGNOSIS — N2581 Secondary hyperparathyroidism of renal origin: Secondary | ICD-10-CM | POA: Diagnosis not present

## 2018-04-11 DIAGNOSIS — Z4932 Encounter for adequacy testing for peritoneal dialysis: Secondary | ICD-10-CM | POA: Diagnosis not present

## 2018-04-11 DIAGNOSIS — D631 Anemia in chronic kidney disease: Secondary | ICD-10-CM | POA: Diagnosis not present

## 2018-04-12 DIAGNOSIS — Z4932 Encounter for adequacy testing for peritoneal dialysis: Secondary | ICD-10-CM | POA: Diagnosis not present

## 2018-04-12 DIAGNOSIS — N2589 Other disorders resulting from impaired renal tubular function: Secondary | ICD-10-CM | POA: Diagnosis not present

## 2018-04-12 DIAGNOSIS — D509 Iron deficiency anemia, unspecified: Secondary | ICD-10-CM | POA: Diagnosis not present

## 2018-04-12 DIAGNOSIS — N2581 Secondary hyperparathyroidism of renal origin: Secondary | ICD-10-CM | POA: Diagnosis not present

## 2018-04-12 DIAGNOSIS — N186 End stage renal disease: Secondary | ICD-10-CM | POA: Diagnosis not present

## 2018-04-12 DIAGNOSIS — D631 Anemia in chronic kidney disease: Secondary | ICD-10-CM | POA: Diagnosis not present

## 2018-04-13 DIAGNOSIS — Z4932 Encounter for adequacy testing for peritoneal dialysis: Secondary | ICD-10-CM | POA: Diagnosis not present

## 2018-04-13 DIAGNOSIS — N186 End stage renal disease: Secondary | ICD-10-CM | POA: Diagnosis not present

## 2018-04-13 DIAGNOSIS — N2581 Secondary hyperparathyroidism of renal origin: Secondary | ICD-10-CM | POA: Diagnosis not present

## 2018-04-13 DIAGNOSIS — N2589 Other disorders resulting from impaired renal tubular function: Secondary | ICD-10-CM | POA: Diagnosis not present

## 2018-04-13 DIAGNOSIS — D631 Anemia in chronic kidney disease: Secondary | ICD-10-CM | POA: Diagnosis not present

## 2018-04-13 DIAGNOSIS — D509 Iron deficiency anemia, unspecified: Secondary | ICD-10-CM | POA: Diagnosis not present

## 2018-04-14 DIAGNOSIS — D631 Anemia in chronic kidney disease: Secondary | ICD-10-CM | POA: Diagnosis not present

## 2018-04-14 DIAGNOSIS — Z4932 Encounter for adequacy testing for peritoneal dialysis: Secondary | ICD-10-CM | POA: Diagnosis not present

## 2018-04-14 DIAGNOSIS — D509 Iron deficiency anemia, unspecified: Secondary | ICD-10-CM | POA: Diagnosis not present

## 2018-04-14 DIAGNOSIS — N2589 Other disorders resulting from impaired renal tubular function: Secondary | ICD-10-CM | POA: Diagnosis not present

## 2018-04-14 DIAGNOSIS — N2581 Secondary hyperparathyroidism of renal origin: Secondary | ICD-10-CM | POA: Diagnosis not present

## 2018-04-14 DIAGNOSIS — N186 End stage renal disease: Secondary | ICD-10-CM | POA: Diagnosis not present

## 2018-04-15 DIAGNOSIS — N2589 Other disorders resulting from impaired renal tubular function: Secondary | ICD-10-CM | POA: Diagnosis not present

## 2018-04-15 DIAGNOSIS — N2581 Secondary hyperparathyroidism of renal origin: Secondary | ICD-10-CM | POA: Diagnosis not present

## 2018-04-15 DIAGNOSIS — N186 End stage renal disease: Secondary | ICD-10-CM | POA: Diagnosis not present

## 2018-04-15 DIAGNOSIS — D631 Anemia in chronic kidney disease: Secondary | ICD-10-CM | POA: Diagnosis not present

## 2018-04-15 DIAGNOSIS — D509 Iron deficiency anemia, unspecified: Secondary | ICD-10-CM | POA: Diagnosis not present

## 2018-04-15 DIAGNOSIS — Z4932 Encounter for adequacy testing for peritoneal dialysis: Secondary | ICD-10-CM | POA: Diagnosis not present

## 2018-04-16 DIAGNOSIS — N2581 Secondary hyperparathyroidism of renal origin: Secondary | ICD-10-CM | POA: Diagnosis not present

## 2018-04-16 DIAGNOSIS — Z4932 Encounter for adequacy testing for peritoneal dialysis: Secondary | ICD-10-CM | POA: Diagnosis not present

## 2018-04-16 DIAGNOSIS — N2589 Other disorders resulting from impaired renal tubular function: Secondary | ICD-10-CM | POA: Diagnosis not present

## 2018-04-16 DIAGNOSIS — D509 Iron deficiency anemia, unspecified: Secondary | ICD-10-CM | POA: Diagnosis not present

## 2018-04-16 DIAGNOSIS — N186 End stage renal disease: Secondary | ICD-10-CM | POA: Diagnosis not present

## 2018-04-16 DIAGNOSIS — D631 Anemia in chronic kidney disease: Secondary | ICD-10-CM | POA: Diagnosis not present

## 2018-04-17 DIAGNOSIS — D509 Iron deficiency anemia, unspecified: Secondary | ICD-10-CM | POA: Diagnosis not present

## 2018-04-17 DIAGNOSIS — N186 End stage renal disease: Secondary | ICD-10-CM | POA: Diagnosis not present

## 2018-04-17 DIAGNOSIS — D631 Anemia in chronic kidney disease: Secondary | ICD-10-CM | POA: Diagnosis not present

## 2018-04-17 DIAGNOSIS — N2581 Secondary hyperparathyroidism of renal origin: Secondary | ICD-10-CM | POA: Diagnosis not present

## 2018-04-17 DIAGNOSIS — Z4932 Encounter for adequacy testing for peritoneal dialysis: Secondary | ICD-10-CM | POA: Diagnosis not present

## 2018-04-17 DIAGNOSIS — N2589 Other disorders resulting from impaired renal tubular function: Secondary | ICD-10-CM | POA: Diagnosis not present

## 2018-04-18 DIAGNOSIS — N186 End stage renal disease: Secondary | ICD-10-CM | POA: Diagnosis not present

## 2018-04-18 DIAGNOSIS — N2581 Secondary hyperparathyroidism of renal origin: Secondary | ICD-10-CM | POA: Diagnosis not present

## 2018-04-18 DIAGNOSIS — Z4932 Encounter for adequacy testing for peritoneal dialysis: Secondary | ICD-10-CM | POA: Diagnosis not present

## 2018-04-18 DIAGNOSIS — D509 Iron deficiency anemia, unspecified: Secondary | ICD-10-CM | POA: Diagnosis not present

## 2018-04-18 DIAGNOSIS — N2589 Other disorders resulting from impaired renal tubular function: Secondary | ICD-10-CM | POA: Diagnosis not present

## 2018-04-18 DIAGNOSIS — D631 Anemia in chronic kidney disease: Secondary | ICD-10-CM | POA: Diagnosis not present

## 2018-04-19 DIAGNOSIS — N186 End stage renal disease: Secondary | ICD-10-CM | POA: Diagnosis not present

## 2018-04-19 DIAGNOSIS — N2589 Other disorders resulting from impaired renal tubular function: Secondary | ICD-10-CM | POA: Diagnosis not present

## 2018-04-19 DIAGNOSIS — N2581 Secondary hyperparathyroidism of renal origin: Secondary | ICD-10-CM | POA: Diagnosis not present

## 2018-04-19 DIAGNOSIS — D631 Anemia in chronic kidney disease: Secondary | ICD-10-CM | POA: Diagnosis not present

## 2018-04-19 DIAGNOSIS — D509 Iron deficiency anemia, unspecified: Secondary | ICD-10-CM | POA: Diagnosis not present

## 2018-04-19 DIAGNOSIS — Z4932 Encounter for adequacy testing for peritoneal dialysis: Secondary | ICD-10-CM | POA: Diagnosis not present

## 2018-04-20 DIAGNOSIS — D509 Iron deficiency anemia, unspecified: Secondary | ICD-10-CM | POA: Diagnosis not present

## 2018-04-20 DIAGNOSIS — N186 End stage renal disease: Secondary | ICD-10-CM | POA: Diagnosis not present

## 2018-04-20 DIAGNOSIS — D631 Anemia in chronic kidney disease: Secondary | ICD-10-CM | POA: Diagnosis not present

## 2018-04-20 DIAGNOSIS — N2589 Other disorders resulting from impaired renal tubular function: Secondary | ICD-10-CM | POA: Diagnosis not present

## 2018-04-20 DIAGNOSIS — Z4932 Encounter for adequacy testing for peritoneal dialysis: Secondary | ICD-10-CM | POA: Diagnosis not present

## 2018-04-20 DIAGNOSIS — N2581 Secondary hyperparathyroidism of renal origin: Secondary | ICD-10-CM | POA: Diagnosis not present

## 2018-04-21 DIAGNOSIS — D509 Iron deficiency anemia, unspecified: Secondary | ICD-10-CM | POA: Diagnosis not present

## 2018-04-21 DIAGNOSIS — Z4932 Encounter for adequacy testing for peritoneal dialysis: Secondary | ICD-10-CM | POA: Diagnosis not present

## 2018-04-21 DIAGNOSIS — N2589 Other disorders resulting from impaired renal tubular function: Secondary | ICD-10-CM | POA: Diagnosis not present

## 2018-04-21 DIAGNOSIS — D631 Anemia in chronic kidney disease: Secondary | ICD-10-CM | POA: Diagnosis not present

## 2018-04-21 DIAGNOSIS — N2581 Secondary hyperparathyroidism of renal origin: Secondary | ICD-10-CM | POA: Diagnosis not present

## 2018-04-21 DIAGNOSIS — N186 End stage renal disease: Secondary | ICD-10-CM | POA: Diagnosis not present

## 2018-04-22 DIAGNOSIS — N2581 Secondary hyperparathyroidism of renal origin: Secondary | ICD-10-CM | POA: Diagnosis not present

## 2018-04-22 DIAGNOSIS — N186 End stage renal disease: Secondary | ICD-10-CM | POA: Diagnosis not present

## 2018-04-22 DIAGNOSIS — D509 Iron deficiency anemia, unspecified: Secondary | ICD-10-CM | POA: Diagnosis not present

## 2018-04-22 DIAGNOSIS — D631 Anemia in chronic kidney disease: Secondary | ICD-10-CM | POA: Diagnosis not present

## 2018-04-22 DIAGNOSIS — N2589 Other disorders resulting from impaired renal tubular function: Secondary | ICD-10-CM | POA: Diagnosis not present

## 2018-04-22 DIAGNOSIS — Z4932 Encounter for adequacy testing for peritoneal dialysis: Secondary | ICD-10-CM | POA: Diagnosis not present

## 2018-04-23 DIAGNOSIS — N2581 Secondary hyperparathyroidism of renal origin: Secondary | ICD-10-CM | POA: Diagnosis not present

## 2018-04-23 DIAGNOSIS — N2589 Other disorders resulting from impaired renal tubular function: Secondary | ICD-10-CM | POA: Diagnosis not present

## 2018-04-23 DIAGNOSIS — Z4932 Encounter for adequacy testing for peritoneal dialysis: Secondary | ICD-10-CM | POA: Diagnosis not present

## 2018-04-23 DIAGNOSIS — D509 Iron deficiency anemia, unspecified: Secondary | ICD-10-CM | POA: Diagnosis not present

## 2018-04-23 DIAGNOSIS — N186 End stage renal disease: Secondary | ICD-10-CM | POA: Diagnosis not present

## 2018-04-23 DIAGNOSIS — D631 Anemia in chronic kidney disease: Secondary | ICD-10-CM | POA: Diagnosis not present

## 2018-04-24 DIAGNOSIS — Z4932 Encounter for adequacy testing for peritoneal dialysis: Secondary | ICD-10-CM | POA: Diagnosis not present

## 2018-04-24 DIAGNOSIS — N2581 Secondary hyperparathyroidism of renal origin: Secondary | ICD-10-CM | POA: Diagnosis not present

## 2018-04-24 DIAGNOSIS — D631 Anemia in chronic kidney disease: Secondary | ICD-10-CM | POA: Diagnosis not present

## 2018-04-24 DIAGNOSIS — N2589 Other disorders resulting from impaired renal tubular function: Secondary | ICD-10-CM | POA: Diagnosis not present

## 2018-04-24 DIAGNOSIS — D509 Iron deficiency anemia, unspecified: Secondary | ICD-10-CM | POA: Diagnosis not present

## 2018-04-24 DIAGNOSIS — N186 End stage renal disease: Secondary | ICD-10-CM | POA: Diagnosis not present

## 2018-04-25 DIAGNOSIS — D509 Iron deficiency anemia, unspecified: Secondary | ICD-10-CM | POA: Diagnosis not present

## 2018-04-25 DIAGNOSIS — Z4932 Encounter for adequacy testing for peritoneal dialysis: Secondary | ICD-10-CM | POA: Diagnosis not present

## 2018-04-25 DIAGNOSIS — N2589 Other disorders resulting from impaired renal tubular function: Secondary | ICD-10-CM | POA: Diagnosis not present

## 2018-04-25 DIAGNOSIS — N186 End stage renal disease: Secondary | ICD-10-CM | POA: Diagnosis not present

## 2018-04-25 DIAGNOSIS — N2581 Secondary hyperparathyroidism of renal origin: Secondary | ICD-10-CM | POA: Diagnosis not present

## 2018-04-25 DIAGNOSIS — D631 Anemia in chronic kidney disease: Secondary | ICD-10-CM | POA: Diagnosis not present

## 2018-04-26 DIAGNOSIS — D631 Anemia in chronic kidney disease: Secondary | ICD-10-CM | POA: Diagnosis not present

## 2018-04-26 DIAGNOSIS — D509 Iron deficiency anemia, unspecified: Secondary | ICD-10-CM | POA: Diagnosis not present

## 2018-04-26 DIAGNOSIS — N186 End stage renal disease: Secondary | ICD-10-CM | POA: Diagnosis not present

## 2018-04-26 DIAGNOSIS — Z4932 Encounter for adequacy testing for peritoneal dialysis: Secondary | ICD-10-CM | POA: Diagnosis not present

## 2018-04-26 DIAGNOSIS — N2581 Secondary hyperparathyroidism of renal origin: Secondary | ICD-10-CM | POA: Diagnosis not present

## 2018-04-26 DIAGNOSIS — N2589 Other disorders resulting from impaired renal tubular function: Secondary | ICD-10-CM | POA: Diagnosis not present

## 2018-04-27 DIAGNOSIS — D509 Iron deficiency anemia, unspecified: Secondary | ICD-10-CM | POA: Diagnosis not present

## 2018-04-27 DIAGNOSIS — N2581 Secondary hyperparathyroidism of renal origin: Secondary | ICD-10-CM | POA: Diagnosis not present

## 2018-04-27 DIAGNOSIS — N2589 Other disorders resulting from impaired renal tubular function: Secondary | ICD-10-CM | POA: Diagnosis not present

## 2018-04-27 DIAGNOSIS — N186 End stage renal disease: Secondary | ICD-10-CM | POA: Diagnosis not present

## 2018-04-27 DIAGNOSIS — Z4932 Encounter for adequacy testing for peritoneal dialysis: Secondary | ICD-10-CM | POA: Diagnosis not present

## 2018-04-27 DIAGNOSIS — D631 Anemia in chronic kidney disease: Secondary | ICD-10-CM | POA: Diagnosis not present

## 2018-04-28 DIAGNOSIS — N186 End stage renal disease: Secondary | ICD-10-CM | POA: Diagnosis not present

## 2018-04-28 DIAGNOSIS — N2589 Other disorders resulting from impaired renal tubular function: Secondary | ICD-10-CM | POA: Diagnosis not present

## 2018-04-28 DIAGNOSIS — Z4932 Encounter for adequacy testing for peritoneal dialysis: Secondary | ICD-10-CM | POA: Diagnosis not present

## 2018-04-28 DIAGNOSIS — D509 Iron deficiency anemia, unspecified: Secondary | ICD-10-CM | POA: Diagnosis not present

## 2018-04-28 DIAGNOSIS — D631 Anemia in chronic kidney disease: Secondary | ICD-10-CM | POA: Diagnosis not present

## 2018-04-28 DIAGNOSIS — N2581 Secondary hyperparathyroidism of renal origin: Secondary | ICD-10-CM | POA: Diagnosis not present

## 2018-04-29 DIAGNOSIS — D631 Anemia in chronic kidney disease: Secondary | ICD-10-CM | POA: Diagnosis not present

## 2018-04-29 DIAGNOSIS — D509 Iron deficiency anemia, unspecified: Secondary | ICD-10-CM | POA: Diagnosis not present

## 2018-04-29 DIAGNOSIS — Z4932 Encounter for adequacy testing for peritoneal dialysis: Secondary | ICD-10-CM | POA: Diagnosis not present

## 2018-04-29 DIAGNOSIS — N2589 Other disorders resulting from impaired renal tubular function: Secondary | ICD-10-CM | POA: Diagnosis not present

## 2018-04-29 DIAGNOSIS — N186 End stage renal disease: Secondary | ICD-10-CM | POA: Diagnosis not present

## 2018-04-29 DIAGNOSIS — N2581 Secondary hyperparathyroidism of renal origin: Secondary | ICD-10-CM | POA: Diagnosis not present

## 2018-04-30 DIAGNOSIS — Z4932 Encounter for adequacy testing for peritoneal dialysis: Secondary | ICD-10-CM | POA: Diagnosis not present

## 2018-04-30 DIAGNOSIS — D509 Iron deficiency anemia, unspecified: Secondary | ICD-10-CM | POA: Diagnosis not present

## 2018-04-30 DIAGNOSIS — N2581 Secondary hyperparathyroidism of renal origin: Secondary | ICD-10-CM | POA: Diagnosis not present

## 2018-04-30 DIAGNOSIS — N2589 Other disorders resulting from impaired renal tubular function: Secondary | ICD-10-CM | POA: Diagnosis not present

## 2018-04-30 DIAGNOSIS — D631 Anemia in chronic kidney disease: Secondary | ICD-10-CM | POA: Diagnosis not present

## 2018-04-30 DIAGNOSIS — N186 End stage renal disease: Secondary | ICD-10-CM | POA: Diagnosis not present

## 2018-05-01 DIAGNOSIS — N2581 Secondary hyperparathyroidism of renal origin: Secondary | ICD-10-CM | POA: Diagnosis not present

## 2018-05-01 DIAGNOSIS — D509 Iron deficiency anemia, unspecified: Secondary | ICD-10-CM | POA: Diagnosis not present

## 2018-05-01 DIAGNOSIS — Z4932 Encounter for adequacy testing for peritoneal dialysis: Secondary | ICD-10-CM | POA: Diagnosis not present

## 2018-05-01 DIAGNOSIS — N2589 Other disorders resulting from impaired renal tubular function: Secondary | ICD-10-CM | POA: Diagnosis not present

## 2018-05-01 DIAGNOSIS — D631 Anemia in chronic kidney disease: Secondary | ICD-10-CM | POA: Diagnosis not present

## 2018-05-01 DIAGNOSIS — N186 End stage renal disease: Secondary | ICD-10-CM | POA: Diagnosis not present

## 2018-05-02 DIAGNOSIS — N2589 Other disorders resulting from impaired renal tubular function: Secondary | ICD-10-CM | POA: Diagnosis not present

## 2018-05-02 DIAGNOSIS — D509 Iron deficiency anemia, unspecified: Secondary | ICD-10-CM | POA: Diagnosis not present

## 2018-05-02 DIAGNOSIS — N2581 Secondary hyperparathyroidism of renal origin: Secondary | ICD-10-CM | POA: Diagnosis not present

## 2018-05-02 DIAGNOSIS — Z4932 Encounter for adequacy testing for peritoneal dialysis: Secondary | ICD-10-CM | POA: Diagnosis not present

## 2018-05-02 DIAGNOSIS — D631 Anemia in chronic kidney disease: Secondary | ICD-10-CM | POA: Diagnosis not present

## 2018-05-02 DIAGNOSIS — N186 End stage renal disease: Secondary | ICD-10-CM | POA: Diagnosis not present

## 2018-05-03 DIAGNOSIS — N186 End stage renal disease: Secondary | ICD-10-CM | POA: Diagnosis not present

## 2018-05-03 DIAGNOSIS — D631 Anemia in chronic kidney disease: Secondary | ICD-10-CM | POA: Diagnosis not present

## 2018-05-03 DIAGNOSIS — N2581 Secondary hyperparathyroidism of renal origin: Secondary | ICD-10-CM | POA: Diagnosis not present

## 2018-05-03 DIAGNOSIS — Z4932 Encounter for adequacy testing for peritoneal dialysis: Secondary | ICD-10-CM | POA: Diagnosis not present

## 2018-05-03 DIAGNOSIS — N2589 Other disorders resulting from impaired renal tubular function: Secondary | ICD-10-CM | POA: Diagnosis not present

## 2018-05-03 DIAGNOSIS — D509 Iron deficiency anemia, unspecified: Secondary | ICD-10-CM | POA: Diagnosis not present

## 2018-05-04 DIAGNOSIS — D631 Anemia in chronic kidney disease: Secondary | ICD-10-CM | POA: Diagnosis not present

## 2018-05-04 DIAGNOSIS — N2589 Other disorders resulting from impaired renal tubular function: Secondary | ICD-10-CM | POA: Diagnosis not present

## 2018-05-04 DIAGNOSIS — Z992 Dependence on renal dialysis: Secondary | ICD-10-CM | POA: Diagnosis not present

## 2018-05-04 DIAGNOSIS — M311 Thrombotic microangiopathy: Secondary | ICD-10-CM | POA: Diagnosis not present

## 2018-05-04 DIAGNOSIS — D509 Iron deficiency anemia, unspecified: Secondary | ICD-10-CM | POA: Diagnosis not present

## 2018-05-04 DIAGNOSIS — N2581 Secondary hyperparathyroidism of renal origin: Secondary | ICD-10-CM | POA: Diagnosis not present

## 2018-05-04 DIAGNOSIS — Z4932 Encounter for adequacy testing for peritoneal dialysis: Secondary | ICD-10-CM | POA: Diagnosis not present

## 2018-05-04 DIAGNOSIS — N186 End stage renal disease: Secondary | ICD-10-CM | POA: Diagnosis not present

## 2018-05-05 DIAGNOSIS — Z992 Dependence on renal dialysis: Secondary | ICD-10-CM | POA: Diagnosis not present

## 2018-05-05 DIAGNOSIS — E876 Hypokalemia: Secondary | ICD-10-CM | POA: Diagnosis not present

## 2018-05-05 DIAGNOSIS — D509 Iron deficiency anemia, unspecified: Secondary | ICD-10-CM | POA: Diagnosis not present

## 2018-05-05 DIAGNOSIS — E440 Moderate protein-calorie malnutrition: Secondary | ICD-10-CM | POA: Diagnosis not present

## 2018-05-05 DIAGNOSIS — Z4932 Encounter for adequacy testing for peritoneal dialysis: Secondary | ICD-10-CM | POA: Diagnosis not present

## 2018-05-05 DIAGNOSIS — N2581 Secondary hyperparathyroidism of renal origin: Secondary | ICD-10-CM | POA: Diagnosis not present

## 2018-05-05 DIAGNOSIS — N186 End stage renal disease: Secondary | ICD-10-CM | POA: Diagnosis not present

## 2018-05-05 DIAGNOSIS — R17 Unspecified jaundice: Secondary | ICD-10-CM | POA: Diagnosis not present

## 2018-05-05 DIAGNOSIS — D631 Anemia in chronic kidney disease: Secondary | ICD-10-CM | POA: Diagnosis not present

## 2018-05-05 DIAGNOSIS — M311 Thrombotic microangiopathy: Secondary | ICD-10-CM | POA: Diagnosis not present

## 2018-05-05 DIAGNOSIS — Z79899 Other long term (current) drug therapy: Secondary | ICD-10-CM | POA: Diagnosis not present

## 2018-05-06 DIAGNOSIS — D509 Iron deficiency anemia, unspecified: Secondary | ICD-10-CM | POA: Diagnosis not present

## 2018-05-06 DIAGNOSIS — E876 Hypokalemia: Secondary | ICD-10-CM | POA: Diagnosis not present

## 2018-05-06 DIAGNOSIS — N2581 Secondary hyperparathyroidism of renal origin: Secondary | ICD-10-CM | POA: Diagnosis not present

## 2018-05-06 DIAGNOSIS — N186 End stage renal disease: Secondary | ICD-10-CM | POA: Diagnosis not present

## 2018-05-06 DIAGNOSIS — Z4932 Encounter for adequacy testing for peritoneal dialysis: Secondary | ICD-10-CM | POA: Diagnosis not present

## 2018-05-06 DIAGNOSIS — D631 Anemia in chronic kidney disease: Secondary | ICD-10-CM | POA: Diagnosis not present

## 2018-05-07 DIAGNOSIS — D631 Anemia in chronic kidney disease: Secondary | ICD-10-CM | POA: Diagnosis not present

## 2018-05-07 DIAGNOSIS — D509 Iron deficiency anemia, unspecified: Secondary | ICD-10-CM | POA: Diagnosis not present

## 2018-05-07 DIAGNOSIS — N186 End stage renal disease: Secondary | ICD-10-CM | POA: Diagnosis not present

## 2018-05-07 DIAGNOSIS — Z4932 Encounter for adequacy testing for peritoneal dialysis: Secondary | ICD-10-CM | POA: Diagnosis not present

## 2018-05-07 DIAGNOSIS — E876 Hypokalemia: Secondary | ICD-10-CM | POA: Diagnosis not present

## 2018-05-07 DIAGNOSIS — N2581 Secondary hyperparathyroidism of renal origin: Secondary | ICD-10-CM | POA: Diagnosis not present

## 2018-05-08 DIAGNOSIS — D509 Iron deficiency anemia, unspecified: Secondary | ICD-10-CM | POA: Diagnosis not present

## 2018-05-08 DIAGNOSIS — N2581 Secondary hyperparathyroidism of renal origin: Secondary | ICD-10-CM | POA: Diagnosis not present

## 2018-05-08 DIAGNOSIS — Z4932 Encounter for adequacy testing for peritoneal dialysis: Secondary | ICD-10-CM | POA: Diagnosis not present

## 2018-05-08 DIAGNOSIS — E876 Hypokalemia: Secondary | ICD-10-CM | POA: Diagnosis not present

## 2018-05-08 DIAGNOSIS — N186 End stage renal disease: Secondary | ICD-10-CM | POA: Diagnosis not present

## 2018-05-08 DIAGNOSIS — D631 Anemia in chronic kidney disease: Secondary | ICD-10-CM | POA: Diagnosis not present

## 2018-05-09 DIAGNOSIS — Z4932 Encounter for adequacy testing for peritoneal dialysis: Secondary | ICD-10-CM | POA: Diagnosis not present

## 2018-05-09 DIAGNOSIS — D509 Iron deficiency anemia, unspecified: Secondary | ICD-10-CM | POA: Diagnosis not present

## 2018-05-09 DIAGNOSIS — N186 End stage renal disease: Secondary | ICD-10-CM | POA: Diagnosis not present

## 2018-05-09 DIAGNOSIS — E876 Hypokalemia: Secondary | ICD-10-CM | POA: Diagnosis not present

## 2018-05-09 DIAGNOSIS — N2581 Secondary hyperparathyroidism of renal origin: Secondary | ICD-10-CM | POA: Diagnosis not present

## 2018-05-09 DIAGNOSIS — D631 Anemia in chronic kidney disease: Secondary | ICD-10-CM | POA: Diagnosis not present

## 2018-05-10 DIAGNOSIS — N2581 Secondary hyperparathyroidism of renal origin: Secondary | ICD-10-CM | POA: Diagnosis not present

## 2018-05-10 DIAGNOSIS — Z4932 Encounter for adequacy testing for peritoneal dialysis: Secondary | ICD-10-CM | POA: Diagnosis not present

## 2018-05-10 DIAGNOSIS — D631 Anemia in chronic kidney disease: Secondary | ICD-10-CM | POA: Diagnosis not present

## 2018-05-10 DIAGNOSIS — N186 End stage renal disease: Secondary | ICD-10-CM | POA: Diagnosis not present

## 2018-05-10 DIAGNOSIS — D509 Iron deficiency anemia, unspecified: Secondary | ICD-10-CM | POA: Diagnosis not present

## 2018-05-10 DIAGNOSIS — E876 Hypokalemia: Secondary | ICD-10-CM | POA: Diagnosis not present

## 2018-05-11 DIAGNOSIS — N2581 Secondary hyperparathyroidism of renal origin: Secondary | ICD-10-CM | POA: Diagnosis not present

## 2018-05-11 DIAGNOSIS — Z4932 Encounter for adequacy testing for peritoneal dialysis: Secondary | ICD-10-CM | POA: Diagnosis not present

## 2018-05-11 DIAGNOSIS — E876 Hypokalemia: Secondary | ICD-10-CM | POA: Diagnosis not present

## 2018-05-11 DIAGNOSIS — N186 End stage renal disease: Secondary | ICD-10-CM | POA: Diagnosis not present

## 2018-05-11 DIAGNOSIS — D509 Iron deficiency anemia, unspecified: Secondary | ICD-10-CM | POA: Diagnosis not present

## 2018-05-11 DIAGNOSIS — D631 Anemia in chronic kidney disease: Secondary | ICD-10-CM | POA: Diagnosis not present

## 2018-05-12 DIAGNOSIS — D509 Iron deficiency anemia, unspecified: Secondary | ICD-10-CM | POA: Diagnosis not present

## 2018-05-12 DIAGNOSIS — N2581 Secondary hyperparathyroidism of renal origin: Secondary | ICD-10-CM | POA: Diagnosis not present

## 2018-05-12 DIAGNOSIS — E876 Hypokalemia: Secondary | ICD-10-CM | POA: Diagnosis not present

## 2018-05-12 DIAGNOSIS — D631 Anemia in chronic kidney disease: Secondary | ICD-10-CM | POA: Diagnosis not present

## 2018-05-12 DIAGNOSIS — N186 End stage renal disease: Secondary | ICD-10-CM | POA: Diagnosis not present

## 2018-05-12 DIAGNOSIS — Z4932 Encounter for adequacy testing for peritoneal dialysis: Secondary | ICD-10-CM | POA: Diagnosis not present

## 2018-05-13 DIAGNOSIS — E876 Hypokalemia: Secondary | ICD-10-CM | POA: Diagnosis not present

## 2018-05-13 DIAGNOSIS — N186 End stage renal disease: Secondary | ICD-10-CM | POA: Diagnosis not present

## 2018-05-13 DIAGNOSIS — Z4932 Encounter for adequacy testing for peritoneal dialysis: Secondary | ICD-10-CM | POA: Diagnosis not present

## 2018-05-13 DIAGNOSIS — N2581 Secondary hyperparathyroidism of renal origin: Secondary | ICD-10-CM | POA: Diagnosis not present

## 2018-05-13 DIAGNOSIS — D631 Anemia in chronic kidney disease: Secondary | ICD-10-CM | POA: Diagnosis not present

## 2018-05-13 DIAGNOSIS — D509 Iron deficiency anemia, unspecified: Secondary | ICD-10-CM | POA: Diagnosis not present

## 2018-05-14 DIAGNOSIS — Z4932 Encounter for adequacy testing for peritoneal dialysis: Secondary | ICD-10-CM | POA: Diagnosis not present

## 2018-05-14 DIAGNOSIS — E876 Hypokalemia: Secondary | ICD-10-CM | POA: Diagnosis not present

## 2018-05-14 DIAGNOSIS — N2581 Secondary hyperparathyroidism of renal origin: Secondary | ICD-10-CM | POA: Diagnosis not present

## 2018-05-14 DIAGNOSIS — D631 Anemia in chronic kidney disease: Secondary | ICD-10-CM | POA: Diagnosis not present

## 2018-05-14 DIAGNOSIS — D509 Iron deficiency anemia, unspecified: Secondary | ICD-10-CM | POA: Diagnosis not present

## 2018-05-14 DIAGNOSIS — N186 End stage renal disease: Secondary | ICD-10-CM | POA: Diagnosis not present

## 2018-05-15 DIAGNOSIS — D509 Iron deficiency anemia, unspecified: Secondary | ICD-10-CM | POA: Diagnosis not present

## 2018-05-15 DIAGNOSIS — N2581 Secondary hyperparathyroidism of renal origin: Secondary | ICD-10-CM | POA: Diagnosis not present

## 2018-05-15 DIAGNOSIS — E876 Hypokalemia: Secondary | ICD-10-CM | POA: Diagnosis not present

## 2018-05-15 DIAGNOSIS — N186 End stage renal disease: Secondary | ICD-10-CM | POA: Diagnosis not present

## 2018-05-15 DIAGNOSIS — Z4932 Encounter for adequacy testing for peritoneal dialysis: Secondary | ICD-10-CM | POA: Diagnosis not present

## 2018-05-15 DIAGNOSIS — D631 Anemia in chronic kidney disease: Secondary | ICD-10-CM | POA: Diagnosis not present

## 2018-05-16 DIAGNOSIS — Z4932 Encounter for adequacy testing for peritoneal dialysis: Secondary | ICD-10-CM | POA: Diagnosis not present

## 2018-05-16 DIAGNOSIS — D631 Anemia in chronic kidney disease: Secondary | ICD-10-CM | POA: Diagnosis not present

## 2018-05-16 DIAGNOSIS — R82998 Other abnormal findings in urine: Secondary | ICD-10-CM | POA: Diagnosis not present

## 2018-05-16 DIAGNOSIS — E876 Hypokalemia: Secondary | ICD-10-CM | POA: Diagnosis not present

## 2018-05-16 DIAGNOSIS — N186 End stage renal disease: Secondary | ICD-10-CM | POA: Diagnosis not present

## 2018-05-16 DIAGNOSIS — D509 Iron deficiency anemia, unspecified: Secondary | ICD-10-CM | POA: Diagnosis not present

## 2018-05-16 DIAGNOSIS — N2581 Secondary hyperparathyroidism of renal origin: Secondary | ICD-10-CM | POA: Diagnosis not present

## 2018-05-17 DIAGNOSIS — N186 End stage renal disease: Secondary | ICD-10-CM | POA: Diagnosis not present

## 2018-05-17 DIAGNOSIS — E876 Hypokalemia: Secondary | ICD-10-CM | POA: Diagnosis not present

## 2018-05-17 DIAGNOSIS — D509 Iron deficiency anemia, unspecified: Secondary | ICD-10-CM | POA: Diagnosis not present

## 2018-05-17 DIAGNOSIS — Z4932 Encounter for adequacy testing for peritoneal dialysis: Secondary | ICD-10-CM | POA: Diagnosis not present

## 2018-05-17 DIAGNOSIS — N2581 Secondary hyperparathyroidism of renal origin: Secondary | ICD-10-CM | POA: Diagnosis not present

## 2018-05-17 DIAGNOSIS — D631 Anemia in chronic kidney disease: Secondary | ICD-10-CM | POA: Diagnosis not present

## 2018-05-18 DIAGNOSIS — Z4932 Encounter for adequacy testing for peritoneal dialysis: Secondary | ICD-10-CM | POA: Diagnosis not present

## 2018-05-18 DIAGNOSIS — N2581 Secondary hyperparathyroidism of renal origin: Secondary | ICD-10-CM | POA: Diagnosis not present

## 2018-05-18 DIAGNOSIS — D631 Anemia in chronic kidney disease: Secondary | ICD-10-CM | POA: Diagnosis not present

## 2018-05-18 DIAGNOSIS — E876 Hypokalemia: Secondary | ICD-10-CM | POA: Diagnosis not present

## 2018-05-18 DIAGNOSIS — D509 Iron deficiency anemia, unspecified: Secondary | ICD-10-CM | POA: Diagnosis not present

## 2018-05-18 DIAGNOSIS — N186 End stage renal disease: Secondary | ICD-10-CM | POA: Diagnosis not present

## 2018-05-19 DIAGNOSIS — E876 Hypokalemia: Secondary | ICD-10-CM | POA: Diagnosis not present

## 2018-05-19 DIAGNOSIS — Z4932 Encounter for adequacy testing for peritoneal dialysis: Secondary | ICD-10-CM | POA: Diagnosis not present

## 2018-05-19 DIAGNOSIS — N186 End stage renal disease: Secondary | ICD-10-CM | POA: Diagnosis not present

## 2018-05-19 DIAGNOSIS — D509 Iron deficiency anemia, unspecified: Secondary | ICD-10-CM | POA: Diagnosis not present

## 2018-05-19 DIAGNOSIS — D631 Anemia in chronic kidney disease: Secondary | ICD-10-CM | POA: Diagnosis not present

## 2018-05-19 DIAGNOSIS — N2581 Secondary hyperparathyroidism of renal origin: Secondary | ICD-10-CM | POA: Diagnosis not present

## 2018-05-20 DIAGNOSIS — E876 Hypokalemia: Secondary | ICD-10-CM | POA: Diagnosis not present

## 2018-05-20 DIAGNOSIS — N2581 Secondary hyperparathyroidism of renal origin: Secondary | ICD-10-CM | POA: Diagnosis not present

## 2018-05-20 DIAGNOSIS — D509 Iron deficiency anemia, unspecified: Secondary | ICD-10-CM | POA: Diagnosis not present

## 2018-05-20 DIAGNOSIS — Z4932 Encounter for adequacy testing for peritoneal dialysis: Secondary | ICD-10-CM | POA: Diagnosis not present

## 2018-05-20 DIAGNOSIS — N186 End stage renal disease: Secondary | ICD-10-CM | POA: Diagnosis not present

## 2018-05-20 DIAGNOSIS — D631 Anemia in chronic kidney disease: Secondary | ICD-10-CM | POA: Diagnosis not present

## 2018-05-21 DIAGNOSIS — N2581 Secondary hyperparathyroidism of renal origin: Secondary | ICD-10-CM | POA: Diagnosis not present

## 2018-05-21 DIAGNOSIS — E876 Hypokalemia: Secondary | ICD-10-CM | POA: Diagnosis not present

## 2018-05-21 DIAGNOSIS — D631 Anemia in chronic kidney disease: Secondary | ICD-10-CM | POA: Diagnosis not present

## 2018-05-21 DIAGNOSIS — D509 Iron deficiency anemia, unspecified: Secondary | ICD-10-CM | POA: Diagnosis not present

## 2018-05-21 DIAGNOSIS — N186 End stage renal disease: Secondary | ICD-10-CM | POA: Diagnosis not present

## 2018-05-21 DIAGNOSIS — Z4932 Encounter for adequacy testing for peritoneal dialysis: Secondary | ICD-10-CM | POA: Diagnosis not present

## 2018-05-22 DIAGNOSIS — D509 Iron deficiency anemia, unspecified: Secondary | ICD-10-CM | POA: Diagnosis not present

## 2018-05-22 DIAGNOSIS — N186 End stage renal disease: Secondary | ICD-10-CM | POA: Diagnosis not present

## 2018-05-22 DIAGNOSIS — E876 Hypokalemia: Secondary | ICD-10-CM | POA: Diagnosis not present

## 2018-05-22 DIAGNOSIS — N2581 Secondary hyperparathyroidism of renal origin: Secondary | ICD-10-CM | POA: Diagnosis not present

## 2018-05-22 DIAGNOSIS — D631 Anemia in chronic kidney disease: Secondary | ICD-10-CM | POA: Diagnosis not present

## 2018-05-22 DIAGNOSIS — Z4932 Encounter for adequacy testing for peritoneal dialysis: Secondary | ICD-10-CM | POA: Diagnosis not present

## 2018-05-23 DIAGNOSIS — D631 Anemia in chronic kidney disease: Secondary | ICD-10-CM | POA: Diagnosis not present

## 2018-05-23 DIAGNOSIS — N2581 Secondary hyperparathyroidism of renal origin: Secondary | ICD-10-CM | POA: Diagnosis not present

## 2018-05-23 DIAGNOSIS — E876 Hypokalemia: Secondary | ICD-10-CM | POA: Diagnosis not present

## 2018-05-23 DIAGNOSIS — N186 End stage renal disease: Secondary | ICD-10-CM | POA: Diagnosis not present

## 2018-05-23 DIAGNOSIS — Z4932 Encounter for adequacy testing for peritoneal dialysis: Secondary | ICD-10-CM | POA: Diagnosis not present

## 2018-05-23 DIAGNOSIS — D509 Iron deficiency anemia, unspecified: Secondary | ICD-10-CM | POA: Diagnosis not present

## 2018-05-24 DIAGNOSIS — N2581 Secondary hyperparathyroidism of renal origin: Secondary | ICD-10-CM | POA: Diagnosis not present

## 2018-05-24 DIAGNOSIS — E876 Hypokalemia: Secondary | ICD-10-CM | POA: Diagnosis not present

## 2018-05-24 DIAGNOSIS — Z4932 Encounter for adequacy testing for peritoneal dialysis: Secondary | ICD-10-CM | POA: Diagnosis not present

## 2018-05-24 DIAGNOSIS — D509 Iron deficiency anemia, unspecified: Secondary | ICD-10-CM | POA: Diagnosis not present

## 2018-05-24 DIAGNOSIS — N186 End stage renal disease: Secondary | ICD-10-CM | POA: Diagnosis not present

## 2018-05-24 DIAGNOSIS — D631 Anemia in chronic kidney disease: Secondary | ICD-10-CM | POA: Diagnosis not present

## 2018-05-25 ENCOUNTER — Telehealth: Payer: Self-pay | Admitting: General Practice

## 2018-05-25 DIAGNOSIS — D509 Iron deficiency anemia, unspecified: Secondary | ICD-10-CM | POA: Diagnosis not present

## 2018-05-25 DIAGNOSIS — E876 Hypokalemia: Secondary | ICD-10-CM | POA: Diagnosis not present

## 2018-05-25 DIAGNOSIS — Z4932 Encounter for adequacy testing for peritoneal dialysis: Secondary | ICD-10-CM | POA: Diagnosis not present

## 2018-05-25 DIAGNOSIS — N2581 Secondary hyperparathyroidism of renal origin: Secondary | ICD-10-CM | POA: Diagnosis not present

## 2018-05-25 DIAGNOSIS — D631 Anemia in chronic kidney disease: Secondary | ICD-10-CM | POA: Diagnosis not present

## 2018-05-25 DIAGNOSIS — N186 End stage renal disease: Secondary | ICD-10-CM | POA: Diagnosis not present

## 2018-05-25 NOTE — Progress Notes (Deleted)
Cardiology Office Note   Date:  05/25/2018   ID:  Erin Morgan, DOB Jan 10, 1955, MRN [id number]  PCP:  Aretta Nip, MD  Cardiologist:   No primary care provider on file. Referring:  ***  No chief complaint on file.     History of Present Illness: Erin Morgan is a 63 y.o. female who is referred by *** for evaluation of atrial fib.  ***   She did have an echo in 2016 with an EF of 55 - 60%.  ***  Past Medical History:  Diagnosis Date  . Anemia   . Chronic kidney disease    dialysis - t/th/sa- [place]  . Constipation   . Family history of adverse reaction to anesthesia    mom and sister has n/v  . GERD (gastroesophageal reflux disease)   . History of blood transfusion   . Hypertension   . Muscle weakness   . Osteopenia   . PONV (postoperative nausea and vomiting)    Headache  . Rheumatoid arthritis ([place])   . Seasonal allergies   . Thoracic aortic aneurysm ([place]) 07/18/2017   4.0 cm ascending thoracic noted on CT 07/18/2017  . Wears glasses     Past Surgical History:  Procedure Laterality Date  . AV FISTULA PLACEMENT Left 11/18/2014   Procedure: BRACHIOCEPHALIC ARTERIOVENOUS (AV) FISTULA CREATION;  Surgeon: Conrad Lake Lillian, MD;  Location: [facility];  Service: Vascular;  Laterality: Left;  . CARPAL TUNNEL RELEASE Right 04/05/2014   Procedure: RIGHT CARPAL TUNNEL RELEASE;  Surgeon: Daryll Brod, MD;  Location: [facility];  Service: Orthopedics;  Laterality: Right;  ANESTHESIA:  IV REGIONAL FAB  . COLONOSCOPY    . REVISON OF ARTERIOVENOUS FISTULA Left 10/01/2016   Procedure: REVISON OF LEFT ARM ARTERIOVENOUS FISTULA;  Surgeon: Angelia Mould, MD;  Location: [facility];  Service: Vascular;  Laterality: Left;  . REVISON OF ARTERIOVENOUS FISTULA Left 93/57/0177   Procedure: PLICATION OF LEFT ARM  ARTERIOVENOUS FISTULA;  Surgeon: Angelia Mould, MD;  Location: [place];  Service: Vascular;  Laterality: Left;  . TONSILLECTOMY     . TUMOR EXCISION  2000   right foot     Current Outpatient Medications  Medication Sig Dispense Refill  . acetaminophen (TYLENOL) 500 MG tablet Take 1,000 mg by mouth every 8 (eight) hours as needed for headache.    . cephALEXin (KEFLEX) 250 MG capsule Take 1 capsule (250 mg total) by mouth daily. Take once daily AFTER completing peritoneal dialysis 5 capsule 0  . ferric citrate (AURYXIA) 1 GM 210 MG(Fe) tablet Take 420 mg by mouth See admin instructions. Take 2 tablets (420 mg) by mouth with each meal & 2 tablet (210 mg) by mouth with each protein snack    . fluocinonide (LIDEX) 0.05 % external solution Apply 1 application topically at bedtime. MIX WITH CETAPHIL    . gentamicin cream (GARAMYCIN) 0.1 % Apply 1 application topically daily. For dialysis catheter    . hydrALAZINE (APRESOLINE) 100 MG tablet Take 100 mg by mouth 3 (three) times daily.     Marland Kitchen HYDROcodone-acetaminophen (NORCO/VICODIN) 5-325 MG tablet Take 1 tablet by mouth every 4 (four) hours as needed. 8 tablet 0  . hydroxychloroquine (PLAQUENIL) 200 MG tablet Take 200 mg by mouth 2 (two) times daily.    Marland Kitchen labetalol (NORMODYNE) 200 MG tablet Take 200 mg by mouth 2 (two) times daily.     Marland Kitchen omeprazole (PRILOSEC) 20 MG capsule Take 20 mg  by mouth 2 (two) times daily.     Marland Kitchen oxyCODONE (ROXICODONE) 5 MG immediate release tablet Take 1 tablet (5 mg total) by mouth every 4 (four) hours as needed. 15 tablet 0  . SOLIRIS 300 MG/30ML SOLN injection Inject 300 mg into the vein every 14 (fourteen) days.      No current facility-administered medications for this visit.     Allergies:   Patient has no known allergies.    Social History:  The patient  reports that she has never smoked. She has never used smokeless tobacco. She reports that she does not drink alcohol or use drugs.   Family History:  The patient's ***family history includes Cancer in her mother; Cervical cancer in her mother; Hyperlipidemia in her father and mother;  Hypertension in her father and mother; Rheum arthritis in an other family member; Stroke in her father.    ROS:  Please see the history of present illness.   Otherwise, review of systems are positive for {NONE DEFAULTED:18576::"none"}.   All other systems are reviewed and negative.    PHYSICAL EXAM: VS:  There were no vitals taken for this visit. , BMI There is no height or weight on file to calculate BMI. GENERAL:  Well appearing HEENT:  Pupils equal round and reactive, fundi not visualized, oral mucosa unremarkable NECK:  No jugular venous distention, waveform within normal limits, carotid upstroke brisk and symmetric, no bruits, no thyromegaly LYMPHATICS:  No cervical, inguinal adenopathy LUNGS:  Clear to auscultation bilaterally BACK:  No CVA tenderness CHEST:  Unremarkable HEART:  PMI not displaced or sustained,S1 and S2 within normal limits, no S3, no S4, no clicks, no rubs, *** murmurs ABD:  Flat, positive bowel sounds normal in frequency in pitch, no bruits, no rebound, no guarding, no midline pulsatile mass, no hepatomegaly, no splenomegaly EXT:  2 plus pulses throughout, no edema, no cyanosis no clubbing SKIN:  No rashes no nodules NEURO:  Cranial nerves II through XII grossly intact, motor grossly intact throughout PSYCH:  Cognitively intact, oriented to person place and time    EKG:  EKG {ACTION; IS/IS UXY:33383291} ordered today. The ekg ordered today demonstrates ***   Recent Labs: 02/21/2018: ALT 33; BUN 68; Creatinine, Ser 10.52; Hemoglobin 11.2; Platelets 249; Potassium 4.2; Sodium 129    Lipid Panel No results found for: CHOL, TRIG, HDL, CHOLHDL, VLDL, LDLCALC, LDLDIRECT    Wt Readings from Last 3 Encounters:  12/14/17 108 lb (49 kg)  10/31/17 110 lb (49.9 kg)  10/20/17 114 lb (51.7 kg)      Other studies Reviewed: Additional studies/ records that were reviewed today include: ***. Review of the above records demonstrates:  Please see elsewhere in the  note.  ***   ASSESSMENT AND PLAN:  ATRIAL FIB:  ***   Current medicines are reviewed at length with the patient today.  The patient {ACTIONS; HAS/DOES NOT HAVE:19233} concerns regarding medicines.  The following changes have been made:  {PLAN; NO CHANGE:13088:s}  Labs/ tests ordered today include: *** No orders of the defined types were placed in this encounter.    Disposition:   FU with ***    Signed, Minus Breeding, MD  05/25/2018 6:47 PM    [facility]

## 2018-05-25 NOTE — Telephone Encounter (Signed)
Spoke with pt and appointment made for tomorrow 5/22 at 320 with Dr. Percival Spanish.

## 2018-05-25 NOTE — Telephone Encounter (Signed)
[facility] called today, they want this patient to been seen in the office, she is a new patient to [place]. They are going to be faxing over notes and labs. They are unable to to an EKG.   They said patient has new onset A-Fib.

## 2018-05-25 NOTE — Telephone Encounter (Signed)
Left message to call back  

## 2018-05-26 ENCOUNTER — Inpatient Hospital Stay (HOSPITAL_COMMUNITY)
Admission: EM | Admit: 2018-05-26 | Discharge: 2018-05-28 | DRG: 280 | Disposition: A | Discharge status: Home / Self Care | Payer: Medicare Other | Source: Ambulatory Visit | Attending: Interventional Cardiology | Admitting: Interventional Cardiology

## 2018-05-26 ENCOUNTER — Encounter (HOSPITAL_COMMUNITY)
Admission: EM | Disposition: A | Discharge status: Home / Self Care | Payer: Self-pay | Source: Ambulatory Visit | Attending: Interventional Cardiology

## 2018-05-26 ENCOUNTER — Emergency Department (HOSPITAL_COMMUNITY): Payer: Medicare Other

## 2018-05-26 ENCOUNTER — Encounter (HOSPITAL_COMMUNITY): Payer: Self-pay | Admitting: Emergency Medicine

## 2018-05-26 ENCOUNTER — Ambulatory Visit: Payer: BC Managed Care – PPO | Admitting: Cardiology

## 2018-05-26 ENCOUNTER — Inpatient Hospital Stay (HOSPITAL_COMMUNITY): Payer: Medicare Other

## 2018-05-26 DIAGNOSIS — I10 Essential (primary) hypertension: Secondary | ICD-10-CM | POA: Diagnosis present

## 2018-05-26 DIAGNOSIS — D593 Hemolytic-uremic syndrome: Secondary | ICD-10-CM | POA: Diagnosis present

## 2018-05-26 DIAGNOSIS — K219 Gastro-esophageal reflux disease without esophagitis: Secondary | ICD-10-CM | POA: Diagnosis present

## 2018-05-26 DIAGNOSIS — N186 End stage renal disease: Secondary | ICD-10-CM

## 2018-05-26 DIAGNOSIS — Z8349 Family history of other endocrine, nutritional and metabolic diseases: Secondary | ICD-10-CM | POA: Diagnosis not present

## 2018-05-26 DIAGNOSIS — R7989 Other specified abnormal findings of blood chemistry: Secondary | ICD-10-CM | POA: Diagnosis not present

## 2018-05-26 DIAGNOSIS — I499 Cardiac arrhythmia, unspecified: Secondary | ICD-10-CM | POA: Diagnosis not present

## 2018-05-26 DIAGNOSIS — I712 Thoracic aortic aneurysm, without rupture: Secondary | ICD-10-CM | POA: Diagnosis present

## 2018-05-26 DIAGNOSIS — Z992 Dependence on renal dialysis: Secondary | ICD-10-CM | POA: Diagnosis not present

## 2018-05-26 DIAGNOSIS — Z79899 Other long term (current) drug therapy: Secondary | ICD-10-CM

## 2018-05-26 DIAGNOSIS — I4581 Long QT syndrome: Secondary | ICD-10-CM | POA: Diagnosis present

## 2018-05-26 DIAGNOSIS — I440 Atrioventricular block, first degree: Secondary | ICD-10-CM | POA: Diagnosis present

## 2018-05-26 DIAGNOSIS — I213 ST elevation (STEMI) myocardial infarction of unspecified site: Secondary | ICD-10-CM | POA: Diagnosis not present

## 2018-05-26 DIAGNOSIS — I21A1 Myocardial infarction type 2: Secondary | ICD-10-CM | POA: Diagnosis present

## 2018-05-26 DIAGNOSIS — I959 Hypotension, unspecified: Secondary | ICD-10-CM | POA: Diagnosis present

## 2018-05-26 DIAGNOSIS — Z4932 Encounter for adequacy testing for peritoneal dialysis: Secondary | ICD-10-CM | POA: Diagnosis not present

## 2018-05-26 DIAGNOSIS — I471 Supraventricular tachycardia: Secondary | ICD-10-CM | POA: Diagnosis present

## 2018-05-26 DIAGNOSIS — I2489 Other forms of acute ischemic heart disease: Secondary | ICD-10-CM

## 2018-05-26 DIAGNOSIS — I480 Paroxysmal atrial fibrillation: Secondary | ICD-10-CM | POA: Diagnosis present

## 2018-05-26 DIAGNOSIS — R000 Tachycardia, unspecified: Secondary | ICD-10-CM | POA: Diagnosis not present

## 2018-05-26 DIAGNOSIS — N2581 Secondary hyperparathyroidism of renal origin: Secondary | ICD-10-CM | POA: Diagnosis present

## 2018-05-26 DIAGNOSIS — Z8249 Family history of ischemic heart disease and other diseases of the circulatory system: Secondary | ICD-10-CM | POA: Diagnosis not present

## 2018-05-26 DIAGNOSIS — I214 Non-ST elevation (NSTEMI) myocardial infarction: Secondary | ICD-10-CM | POA: Diagnosis not present

## 2018-05-26 DIAGNOSIS — Z823 Family history of stroke: Secondary | ICD-10-CM | POA: Diagnosis not present

## 2018-05-26 DIAGNOSIS — Z79891 Long term (current) use of opiate analgesic: Secondary | ICD-10-CM

## 2018-05-26 DIAGNOSIS — I2510 Atherosclerotic heart disease of native coronary artery without angina pectoris: Secondary | ICD-10-CM | POA: Diagnosis present

## 2018-05-26 DIAGNOSIS — Z20828 Contact with and (suspected) exposure to other viral communicable diseases: Secondary | ICD-10-CM | POA: Diagnosis not present

## 2018-05-26 DIAGNOSIS — R031 Nonspecific low blood-pressure reading: Secondary | ICD-10-CM | POA: Diagnosis not present

## 2018-05-26 DIAGNOSIS — M069 Rheumatoid arthritis, unspecified: Secondary | ICD-10-CM | POA: Diagnosis present

## 2018-05-26 DIAGNOSIS — E876 Hypokalemia: Secondary | ICD-10-CM | POA: Diagnosis present

## 2018-05-26 DIAGNOSIS — M8580 Other specified disorders of bone density and structure, unspecified site: Secondary | ICD-10-CM | POA: Diagnosis present

## 2018-05-26 DIAGNOSIS — I120 Hypertensive chronic kidney disease with stage 5 chronic kidney disease or end stage renal disease: Secondary | ICD-10-CM | POA: Diagnosis present

## 2018-05-26 DIAGNOSIS — Z1159 Encounter for screening for other viral diseases: Secondary | ICD-10-CM | POA: Diagnosis not present

## 2018-05-26 DIAGNOSIS — D509 Iron deficiency anemia, unspecified: Secondary | ICD-10-CM | POA: Diagnosis not present

## 2018-05-26 DIAGNOSIS — I4719 Other supraventricular tachycardia: Secondary | ICD-10-CM

## 2018-05-26 DIAGNOSIS — I248 Other forms of acute ischemic heart disease: Secondary | ICD-10-CM

## 2018-05-26 DIAGNOSIS — D631 Anemia in chronic kidney disease: Secondary | ICD-10-CM | POA: Diagnosis present

## 2018-05-26 DIAGNOSIS — R9431 Abnormal electrocardiogram [ECG] [EKG]: Secondary | ICD-10-CM

## 2018-05-26 DIAGNOSIS — I447 Left bundle-branch block, unspecified: Secondary | ICD-10-CM | POA: Diagnosis present

## 2018-05-26 HISTORY — DX: End stage renal disease: N18.6

## 2018-05-26 HISTORY — DX: Supraventricular tachycardia: I47.1

## 2018-05-26 HISTORY — PX: LEFT HEART CATH AND CORONARY ANGIOGRAPHY: CATH118249

## 2018-05-26 HISTORY — DX: Other forms of acute ischemic heart disease: I24.8

## 2018-05-26 HISTORY — DX: Other forms of acute ischemic heart disease: I24.89

## 2018-05-26 HISTORY — DX: Other supraventricular tachycardia: I47.19

## 2018-05-26 LAB — ECHOCARDIOGRAM COMPLETE: Weight: 1664 oz

## 2018-05-26 LAB — CBC WITH DIFFERENTIAL/PLATELET
Abs Immature Granulocytes: 0.11 10*3/uL — ABNORMAL HIGH (ref 0.00–0.07)
Basophils Absolute: 0.1 10*3/uL (ref 0.0–0.1)
Basophils Relative: 1 %
Eosinophils Absolute: 0 10*3/uL (ref 0.0–0.5)
Eosinophils Relative: 0 %
HCT: 39 % (ref 36.0–46.0)
Hemoglobin: 12.6 g/dL (ref 12.0–15.0)
Immature Granulocytes: 1 %
Lymphocytes Relative: 13 %
Lymphs Abs: 1.1 10*3/uL (ref 0.7–4.0)
MCH: 29.4 pg (ref 26.0–34.0)
MCHC: 32.3 g/dL (ref 30.0–36.0)
MCV: 90.9 fL (ref 80.0–100.0)
Monocytes Absolute: 1.1 10*3/uL — ABNORMAL HIGH (ref 0.1–1.0)
Monocytes Relative: 14 %
Neutro Abs: 5.7 10*3/uL (ref 1.7–7.7)
Neutrophils Relative %: 71 %
Platelets: 283 10*3/uL (ref 150–400)
RBC: 4.29 MIL/uL (ref 3.87–5.11)
RDW: 15 % (ref 11.5–15.5)
WBC: 8 10*3/uL (ref 4.0–10.5)
nRBC: 0 % (ref 0.0–0.2)

## 2018-05-26 LAB — CBG MONITORING, ED: Glucose-Capillary: 87 mg/dL (ref 70–99)

## 2018-05-26 LAB — COMPREHENSIVE METABOLIC PANEL
ALT: 26 U/L (ref 0–44)
AST: 47 U/L — ABNORMAL HIGH (ref 15–41)
Albumin: 3.1 g/dL — ABNORMAL LOW (ref 3.5–5.0)
Alkaline Phosphatase: 44 U/L (ref 38–126)
Anion gap: 18 — ABNORMAL HIGH (ref 5–15)
BUN: 46 mg/dL — ABNORMAL HIGH (ref 8–23)
CO2: 23 mmol/L (ref 22–32)
Calcium: 9.4 mg/dL (ref 8.9–10.3)
Chloride: 88 mmol/L — ABNORMAL LOW (ref 98–111)
Creatinine, Ser: 9.32 mg/dL — ABNORMAL HIGH (ref 0.44–1.00)
GFR calc Af Amer: 5 mL/min — ABNORMAL LOW (ref >60)
GFR calc non Af Amer: 4 mL/min — ABNORMAL LOW (ref >60)
Glucose, Bld: 105 mg/dL — ABNORMAL HIGH (ref 70–99)
Potassium: 3.3 mmol/L — ABNORMAL LOW (ref 3.5–5.1)
Sodium: 129 mmol/L — ABNORMAL LOW (ref 135–145)
Total Bilirubin: 0.6 mg/dL (ref 0.3–1.2)
Total Protein: 5.7 g/dL — ABNORMAL LOW (ref 6.5–8.1)

## 2018-05-26 LAB — BRAIN NATRIURETIC PEPTIDE: B Natriuretic Peptide: >4500 pg/mL — ABNORMAL HIGH (ref 0.0–100.0)

## 2018-05-26 LAB — PROTIME-INR
INR: 0.9 (ref 0.8–1.2)
Prothrombin Time: 12.4 seconds (ref 11.4–15.2)

## 2018-05-26 LAB — SARS CORONAVIRUS 2 BY RT PCR (HOSPITAL ORDER, PERFORMED IN ~~LOC~~ HOSPITAL LAB): SARS Coronavirus 2: NEGATIVE

## 2018-05-26 LAB — MAGNESIUM: Magnesium: 1.4 mg/dL — ABNORMAL LOW (ref 1.7–2.4)

## 2018-05-26 LAB — TROPONIN I: Troponin I: 3 ng/mL (ref <0.03)

## 2018-05-26 SURGERY — LEFT HEART CATH AND CORONARY ANGIOGRAPHY
Anesthesia: LOCAL

## 2018-05-26 MED ORDER — ASPIRIN EC 81 MG PO TBEC
81.0000 mg | DELAYED_RELEASE_TABLET | Freq: Every day | ORAL | Status: DC
  Administered 2018-05-27 – 2018-05-28 (×2): 81 mg via ORAL
  Filled 2018-05-26 (×2): qty 1

## 2018-05-26 MED ORDER — MIDAZOLAM HCL 2 MG/2ML IJ SOLN
INTRAMUSCULAR | Status: DC | PRN
  Administered 2018-05-26: 1 mg via INTRAVENOUS

## 2018-05-26 MED ORDER — ASPIRIN 81 MG PO CHEW: 324.0000 mg | CHEWABLE_TABLET | Freq: Once | ORAL | Status: DC

## 2018-05-26 MED ORDER — HEPARIN BOLUS VIA INFUSION
3000.0000 [IU] | Freq: Once | INTRAVENOUS | Status: AC
  Administered 2018-05-26: 3000 [IU] via INTRAVENOUS
  Filled 2018-05-26: qty 3000

## 2018-05-26 MED ORDER — HEPARIN (PORCINE) 25000 UT/250ML-% IV SOLN
550.0000 [IU]/h | INTRAVENOUS | Status: DC
  Administered 2018-05-26: 550 [IU]/h via INTRAVENOUS
  Filled 2018-05-26: qty 250

## 2018-05-26 MED ORDER — FENTANYL CITRATE (PF) 100 MCG/2ML IJ SOLN
INTRAMUSCULAR | Status: DC | PRN
  Administered 2018-05-26: 25 ug via INTRAVENOUS

## 2018-05-26 MED ORDER — HEPARIN (PORCINE) IN NACL 1000-0.9 UT/500ML-% IV SOLN
INTRAVENOUS | Status: DC | PRN
  Administered 2018-05-26 (×2): 500 mL

## 2018-05-26 MED ORDER — SODIUM CHLORIDE 0.9 % WEIGHT BASED INFUSION: 1.0000 mL/kg/h | INTRAVENOUS | Status: AC

## 2018-05-26 MED ORDER — SODIUM CHLORIDE 0.9 % IV BOLUS
500.0000 mL | Freq: Once | INTRAVENOUS | Status: AC
  Administered 2018-05-26: 500 mL via INTRAVENOUS

## 2018-05-26 MED ORDER — PANTOPRAZOLE SODIUM 40 MG PO TBEC
40.0000 mg | DELAYED_RELEASE_TABLET | Freq: Every day | ORAL | Status: DC
  Administered 2018-05-26 – 2018-05-27 (×2): 40 mg via ORAL
  Filled 2018-05-26 (×2): qty 1

## 2018-05-26 MED ORDER — FERRIC CITRATE 1 GM 210 MG(FE) PO TABS
420.0000 mg | ORAL_TABLET | Freq: Three times a day (TID) | ORAL | Status: DC
  Administered 2018-05-27 – 2018-05-28 (×4): 420 mg via ORAL
  Filled 2018-05-26 (×6): qty 2

## 2018-05-26 MED ORDER — SODIUM CHLORIDE 0.9 % IV SOLN: INTRAVENOUS | Status: DC

## 2018-05-26 MED ORDER — SODIUM CHLORIDE 0.9 % IV BOLUS
1000.0000 mL | Freq: Once | INTRAVENOUS | Status: DC
  Administered 2018-05-26: 16:00:00 200 mL via INTRAVENOUS

## 2018-05-26 MED ORDER — ROPINIROLE HCL 0.5 MG PO TABS
0.2500 mg | ORAL_TABLET | Freq: Every day | ORAL | Status: DC
  Administered 2018-05-26 – 2018-05-27 (×2): 0.25 mg via ORAL
  Filled 2018-05-26 (×2): qty 1

## 2018-05-26 MED ORDER — HEPARIN SODIUM (PORCINE) 1000 UNIT/ML IJ SOLN
INTRAMUSCULAR | Status: DC | PRN
  Administered 2018-05-26: 2500 [IU] via INTRAVENOUS

## 2018-05-26 MED ORDER — GENTAMICIN SULFATE 0.1 % EX CREA: 1.0000 "application " | TOPICAL_CREAM | Freq: Every day | CUTANEOUS | Status: DC

## 2018-05-26 MED ORDER — VERAPAMIL HCL 2.5 MG/ML IV SOLN
INTRAVENOUS | Status: DC | PRN
  Administered 2018-05-26: 10 mL via INTRA_ARTERIAL

## 2018-05-26 MED ORDER — ONDANSETRON HCL 4 MG/2ML IJ SOLN: 4.0000 mg | Freq: Four times a day (QID) | INTRAMUSCULAR | Status: DC | PRN

## 2018-05-26 MED ORDER — SODIUM CHLORIDE 0.9 % IV SOLN: 250.0000 mL | INTRAVENOUS | Status: DC | PRN

## 2018-05-26 MED ORDER — POTASSIUM CHLORIDE CRYS ER 20 MEQ PO TBCR
20.0000 meq | EXTENDED_RELEASE_TABLET | Freq: Once | ORAL | Status: AC
  Administered 2018-05-26: 20 meq via ORAL
  Filled 2018-05-26: qty 1

## 2018-05-26 MED ORDER — ACETAMINOPHEN 325 MG PO TABS: 650.0000 mg | ORAL_TABLET | ORAL | Status: DC | PRN

## 2018-05-26 MED ORDER — SODIUM CHLORIDE 0.9% FLUSH
3.0000 mL | Freq: Two times a day (BID) | INTRAVENOUS | Status: DC
  Administered 2018-05-26: 3 mL via INTRAVENOUS

## 2018-05-26 MED ORDER — POTASSIUM CHLORIDE CRYS ER 20 MEQ PO TBCR
40.0000 meq | EXTENDED_RELEASE_TABLET | Freq: Once | ORAL | Status: AC
  Administered 2018-05-26: 40 meq via ORAL
  Filled 2018-05-26: qty 2

## 2018-05-26 MED ORDER — LIDOCAINE HCL (PF) 1 % IJ SOLN
INTRAMUSCULAR | Status: DC | PRN
  Administered 2018-05-26: 2 mL via INTRADERMAL

## 2018-05-26 MED ORDER — IOHEXOL 350 MG/ML SOLN
INTRAVENOUS | Status: DC | PRN
  Administered 2018-05-26: 30 mL via INTRA_ARTERIAL

## 2018-05-26 MED ORDER — HEPARIN 1000 UNIT/ML FOR PERITONEAL DIALYSIS
INTRAPERITONEAL | Status: DC | PRN
  Filled 2018-05-26: qty 5000

## 2018-05-26 MED ORDER — SODIUM CHLORIDE 0.9% FLUSH: 3.0000 mL | INTRAVENOUS | Status: DC | PRN

## 2018-05-26 MED ORDER — HEPARIN (PORCINE) IN NACL 1000-0.9 UT/500ML-% IV SOLN
INTRAVENOUS | Status: AC
  Filled 2018-05-26: qty 1000

## 2018-05-26 MED ORDER — HEPARIN 1000 UNIT/ML FOR PERITONEAL DIALYSIS: 500.0000 [IU] | INTRAMUSCULAR | Status: DC | PRN

## 2018-05-26 MED ORDER — LIDOCAINE HCL (PF) 1 % IJ SOLN
INTRAMUSCULAR | Status: AC
  Filled 2018-05-26: qty 30

## 2018-05-26 MED ORDER — MIDAZOLAM HCL 2 MG/2ML IJ SOLN
INTRAMUSCULAR | Status: AC
  Filled 2018-05-26: qty 2

## 2018-05-26 MED ORDER — DEXTROSE 5 % IV SOLN
30.0000 mg/h | INTRAVENOUS | Status: DC
  Filled 2018-05-26: qty 9

## 2018-05-26 MED ORDER — SODIUM CHLORIDE 0.9% FLUSH
3.0000 mL | Freq: Two times a day (BID) | INTRAVENOUS | Status: DC
  Administered 2018-05-27 – 2018-05-28 (×3): 3 mL via INTRAVENOUS

## 2018-05-26 MED ORDER — GENTAMICIN SULFATE 0.1 % EX CREA
1.0000 "application " | TOPICAL_CREAM | Freq: Every day | CUTANEOUS | Status: DC
  Administered 2018-05-26 – 2018-05-28 (×3): 1 via TOPICAL
  Filled 2018-05-26: qty 15

## 2018-05-26 MED ORDER — FLUOCINONIDE 0.05 % EX CREA
1.0000 "application " | TOPICAL_CREAM | Freq: Every day | CUTANEOUS | Status: DC
  Filled 2018-05-26: qty 15

## 2018-05-26 MED ORDER — DEXTROSE 5 % IV SOLN
60.0000 mg/h | INTRAVENOUS | Status: DC
  Filled 2018-05-26: qty 9

## 2018-05-26 MED ORDER — CALCITRIOL 0.5 MCG PO CAPS
1.0000 ug | ORAL_CAPSULE | Freq: Every day | ORAL | Status: DC
  Administered 2018-05-27: 10:00:00 1 ug via ORAL
  Administered 2018-05-28: 0.5 ug via ORAL
  Filled 2018-05-26 (×2): qty 2

## 2018-05-26 MED ORDER — VERAPAMIL HCL 2.5 MG/ML IV SOLN
INTRAVENOUS | Status: AC
  Filled 2018-05-26: qty 2

## 2018-05-26 MED ORDER — AMIODARONE LOAD VIA INFUSION
150.0000 mg | Freq: Once | INTRAVENOUS | Status: DC
  Filled 2018-05-26: qty 83.34

## 2018-05-26 MED ORDER — FENTANYL CITRATE (PF) 100 MCG/2ML IJ SOLN
INTRAMUSCULAR | Status: AC
  Filled 2018-05-26: qty 2

## 2018-05-26 MED ORDER — DELFLEX-LC/1.5% DEXTROSE 344 MOSM/L IP SOLN
INTRAPERITONEAL | Status: DC
  Administered 2018-05-26: 15000 mL via INTRAPERITONEAL

## 2018-05-26 MED ORDER — MAGNESIUM SULFATE 2 GM/50ML IV SOLN
2.0000 g | Freq: Once | INTRAVENOUS | Status: AC
  Administered 2018-05-26: 17:00:00 2 g via INTRAVENOUS
  Filled 2018-05-26: qty 50

## 2018-05-26 SURGICAL SUPPLY — 10 items
CATH 5FR JL3.5 JR4 ANG PIG MP (CATHETERS) ×2 IMPLANT
DEVICE RAD TR BAND REGULAR (VASCULAR PRODUCTS) ×2 IMPLANT
GLIDESHEATH SLEND SS 6F .021 (SHEATH) ×2 IMPLANT
GUIDEWIRE INQWIRE 1.5J.035X260 (WIRE) ×1 IMPLANT
INQWIRE 1.5J .035X260CM (WIRE) ×2
KIT HEART LEFT (KITS) ×2 IMPLANT
PACK CARDIAC CATHETERIZATION (CUSTOM PROCEDURE TRAY) ×2 IMPLANT
SHEATH PROBE COVER 6X72 (BAG) ×2 IMPLANT
TRANSDUCER W/STOPCOCK (MISCELLANEOUS) ×2 IMPLANT
TUBING CIL FLEX 10 FLL-RA (TUBING) ×2 IMPLANT

## 2018-05-26 NOTE — Progress Notes (Signed)
Renal Navigator notified [facility] of patient's admission and negative COVID rapid test result to provide continuity of care and safety. Renal Navigator will alert Home Therapy Program Manager of patient's discharge in order to assist with smooth transition from hospital back to OP HD clinic when medically ready.  Alphonzo Cruise, [place] Renal Navigator [phone]

## 2018-05-26 NOTE — ED Provider Notes (Signed)
[facility] EMERGENCY DEPARTMENT Provider Note   CSN: [id number] Arrival date & time: 05/26/18  1044    History   Chief Complaint Chief Complaint  Patient presents with   Hypotension    HPI Erin Morgan is a 63 y.o. female.     HPI Patient presents with concern of generalized discomfort. Patient denies chest pain, denies hip pain, denies near syncope, denies nausea. Patient has history of hypertension, is on peritoneal dialysis. Last session concluded about 4 hours ago, unremarkably. She notes that she awoke today, feeling different from normal, and with this concern with your physicians office. She also notes that on checking her vitals she was more tachycardic than usual. This occurred in spite of her recent addition of a beta-blocker to her medication regimen. There she was found to have hypotension, EKG was changed from prior, and she was sent here for evaluation. Patient denies history of cardiac disease. EMS reports the patient was hypotensive in route, and multiple EKGs all had similar findings with ST elevation in the anterior leads. Patient was initially designated as a code STEMI, this was canceled prior to arrival by our cardiology colleagues. Past Medical History:  Diagnosis Date   Anemia    Chronic kidney disease    dialysis - t/th/sa- [place]   Constipation    Family history of adverse reaction to anesthesia    mom and sister has n/v   GERD (gastroesophageal reflux disease)    History of blood transfusion    Hypertension    Muscle weakness    Osteopenia    PONV (postoperative nausea and vomiting)    Headache   Rheumatoid arthritis (HCC)    Seasonal allergies    Thoracic aortic aneurysm ([place]) 07/18/2017   4.0 cm ascending thoracic noted on CT 07/18/2017   Wears glasses     Patient Active Problem List   Diagnosis Date Noted   Weakness 01/12/2017   Symptomatic anemia 07/26/2015   GERD  (gastroesophageal reflux disease) 07/26/2015   ESRD (end stage renal disease) ([place])    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   Elevated troponin 07/18/2014   Headache disorder    Rheumatoid arthritis ([place]) 07/17/2014   Hypertensive urgency 07/17/2014   ESRD on dialysis ([place]) 07/17/2014   Hyponatremia 07/17/2014   Essential hypertension     Past Surgical History:  Procedure Laterality Date   AV FISTULA PLACEMENT Left 11/18/2014   Procedure: BRACHIOCEPHALIC ARTERIOVENOUS (AV) FISTULA CREATION;  Surgeon: Conrad [place], MD;  Location: [facility];  Service: Vascular;  Laterality: Left;   CARPAL TUNNEL RELEASE Right 04/05/2014   Procedure: RIGHT CARPAL TUNNEL RELEASE;  Surgeon: Daryll Brod, MD;  Location: [facility];  Service: Orthopedics;  Laterality: Right;  ANESTHESIA:  IV REGIONAL FAB   COLONOSCOPY     REVISON OF ARTERIOVENOUS FISTULA Left 10/01/2016   Procedure: REVISON OF LEFT ARM ARTERIOVENOUS FISTULA;  Surgeon: Angelia Mould, MD;  Location: [facility];  Service: Vascular;  Laterality: Left;   REVISON OF ARTERIOVENOUS FISTULA Left 53/97/6734   Procedure: PLICATION OF LEFT ARM  ARTERIOVENOUS FISTULA;  Surgeon: Angelia Mould, MD;  Location: [facility];  Service: Vascular;  Laterality: Left;   TONSILLECTOMY     TUMOR EXCISION  2000   right foot     OB History   No obstetric history on file.      Home Medications    Prior to Admission medications  Medication Sig Start Date End Date Taking? Authorizing Provider  acetaminophen (TYLENOL) 500 MG tablet Take 1,000 mg by mouth every 8 (eight) hours as needed for headache.    [provider]  cephALEXin (KEFLEX) 250 MG capsule Take 1 capsule (250 mg total) by mouth daily. Take once daily AFTER completing peritoneal dialysis 02/21/18   Virgel Manifold, MD  ferric citrate (AURYXIA) 1 GM 210 MG(Fe) tablet Take 420 mg by mouth See admin  instructions. Take 2 tablets (420 mg) by mouth with each meal & 2 tablet (210 mg) by mouth with each protein snack    [provider]  fluocinonide (LIDEX) 0.05 % external solution Apply 1 application topically at bedtime. [place] 09/06/16   [provider]  gentamicin cream (GARAMYCIN) 0.1 % Apply 1 application topically daily. For dialysis catheter    [provider]  hydrALAZINE (APRESOLINE) 100 MG tablet Take 100 mg by mouth 3 (three) times daily.     [provider]  HYDROcodone-acetaminophen (NORCO/VICODIN) 5-325 MG tablet Take 1 tablet by mouth every 4 (four) hours as needed. 02/21/18   Virgel Manifold, MD  hydroxychloroquine (PLAQUENIL) 200 MG tablet Take 200 mg by mouth 2 (two) times daily.    [provider]  labetalol (NORMODYNE) 200 MG tablet Take 200 mg by mouth 2 (two) times daily.  10/23/14   [provider]  omeprazole (PRILOSEC) 20 MG capsule Take 20 mg by mouth 2 (two) times daily.     [provider]  oxyCODONE (ROXICODONE) 5 MG immediate release tablet Take 1 tablet (5 mg total) by mouth every 4 (four) hours as needed. 10/31/17   Angelia Mould, MD  SOLIRIS 300 MG/30ML SOLN injection Inject 300 mg into the vein every 14 (fourteen) days.  09/16/16   [provider]    Family History Family History  Problem Relation Age of Onset   Hypertension Mother    Cervical cancer Mother    Cancer Mother    Hyperlipidemia Mother    Hypertension Father    Stroke Father    Hyperlipidemia Father    Rheum arthritis Other     Social History Social History   Tobacco Use   Smoking status: Never Smoker   Smokeless tobacco: Never Used  Substance Use Topics   Alcohol use: No    Comment:  no   Drug use: No     Allergies   Patient has no known allergies.   Review of Systems Review of Systems  Constitutional:       Per HPI, otherwise negative  HENT:       Per HPI, otherwise  negative  Respiratory:       Per HPI, otherwise negative  Cardiovascular:       Per HPI, otherwise negative  Gastrointestinal: Negative for vomiting.  Endocrine:       Negative aside from HPI  Genitourinary:       Neg aside from HPI   Musculoskeletal:       Per HPI, otherwise negative  Skin: Negative.   Allergic/Immunologic: Positive for immunocompromised state.  Neurological: Positive for weakness. Negative for syncope.     Physical Exam Updated Vital Signs BP (!) 87/59    Pulse 77    Temp 97.6 F (36.4 C)    Resp 16    Wt 47.2 kg    SpO2 100%    BMI 17.85 kg/m   Physical Exam Vitals signs and nursing note reviewed.  Constitutional:  General: She is not in acute distress.    Appearance: She is well-developed.  HENT:     Head: Normocephalic and atraumatic.  Eyes:     Conjunctiva/sclera: Conjunctivae normal.  Cardiovascular:     Rate and Rhythm: Normal rate and regular rhythm.  Pulmonary:     Effort: Pulmonary effort is normal. No respiratory distress.     Breath sounds: Normal breath sounds. No stridor.  Abdominal:     General: There is no distension.  Skin:    General: Skin is warm and dry.  Neurological:     Mental Status: She is alert and oriented to person, place, and time.     Cranial Nerves: No cranial nerve deficit.      ED Treatments / Results  Labs (all labs ordered are listed, but only abnormal results are displayed) Labs Reviewed  COMPREHENSIVE METABOLIC PANEL - Abnormal; Notable for the following components:      Result Value   Sodium 129 (*)    Potassium 3.3 (*)    Chloride 88 (*)    Glucose, Bld 105 (*)    BUN 46 (*)    Creatinine, Ser 9.32 (*)    Total Protein 5.7 (*)    Albumin 3.1 (*)    AST 47 (*)    GFR calc non Af Amer 4 (*)    GFR calc Af Amer 5 (*)    Anion gap 18 (*)    All other components within normal limits  MAGNESIUM - Abnormal; Notable for the following components:   Magnesium 1.4 (*)    All other components within  normal limits  TROPONIN I - Abnormal; Notable for the following components:   Troponin I 3.00 (*)    All other components within normal limits  CBC WITH DIFFERENTIAL/PLATELET - Abnormal; Notable for the following components:   Monocytes Absolute 1.1 (*)    Abs Immature Granulocytes 0.11 (*)    All other components within normal limits  SARS CORONAVIRUS 2 (HOSPITAL ORDER, [facility])  PROTIME-INR  BRAIN NATRIURETIC PEPTIDE  HEPARIN LEVEL (UNFRACTIONATED)  CBG MONITORING, ED    EKG EKG Interpretation  Date/Time:  Friday May 26 2018 10:53:59 EDT Ventricular Rate:  80 PR Interval:    QRS Duration: 131 QT Interval:  456 QTC Calculation: 527 R Axis:   -78 Text Interpretation:  Sinus rhythm IVCD, consider atypical RBBB LVH with secondary repolarization abnormality ST-t wave abnormality , new compared to prior Abnormal ECG Confirmed by Carmin Muskrat [phone]) on 05/26/2018 10:56:53 AM   Radiology Dg Chest Portable 1 View  Result Date: 05/26/2018 CLINICAL DATA:  Elevated heart right and low blood pressure EXAM: PORTABLE CHEST 1 VIEW COMPARISON:  06/08/2017 FINDINGS: Cardiomegaly, mild. Negative mediastinal contours. There is no edema, consolidation, effusion, or pneumothorax. IMPRESSION: Mild cardiomegaly. Clear lungs. Electronically Signed   By: Monte Fantasia M.D.   On: 05/26/2018 11:21    Procedures Procedures (including critical care time)  Medications Ordered in ED Medications  0.9 %  sodium chloride infusion (has no administration in time range)  heparin ADULT infusion 100 units/mL (25000 units/286mL sodium chloride 0.45%) (550 Units/hr Intravenous New Bag/Given 05/26/18 1253)  sodium chloride 0.9 % bolus 500 mL (0 mLs Intravenous Stopped 05/26/18 1119)  heparin bolus via infusion 3,000 Units (3,000 Units Intravenous Bolus from Bag 05/26/18 1252)     Initial Impression / Assessment and Plan / ED Course  I have reviewed the triage vital signs and the  nursing notes.  Pertinent labs & imaging results that were available during my care of the patient were reviewed by me and considered in my medical decision making (see chart for details).        1:05 PM Labs notable for troponin III. Given the patient's absence of prior catheterization, though she has no current chest pain, I discussed her case with our cardiology colleagues again, patient has started a heparin drip.  This adult female presents with generalized discomfort. In her 74 office she was found be hypotensive, with EKG changes, sent here for evaluation. Here the patient has no chest pain, but does have hypotension, and with concern for cardiac abnormalities, broad differential considered including infection versus ischemic event. Patient's labs notable for troponin of 3.0.  Patient may have had prior event versus silent ongoing ischemia, and required initiation of heparin, discussion with cardiology for admission for further monitoring, management.  Final Clinical Impressions(s) / ED Diagnoses   Final diagnoses:  Non-ST elevation (NSTEMI) myocardial infarction [facility])   CRITICAL CARE Performed by: Carmin Muskrat Total critical care time: 35 minutes Critical care time was exclusive of separately billable procedures and treating other patients. Critical care was necessary to treat or prevent imminent or life-threatening deterioration. Critical care was time spent personally by me on the following activities: development of treatment plan with patient and/or surrogate as well as nursing, discussions with consultants, evaluation of patient's response to treatment, examination of patient, obtaining history from patient or surrogate, ordering and performing treatments and interventions, ordering and review of laboratory studies, ordering and review of radiographic studies, pulse oximetry and re-evaluation of patient's condition.    Carmin Muskrat, MD 05/26/18 1531

## 2018-05-26 NOTE — Progress Notes (Signed)
  Echocardiogram 2D Echocardiogram has been performed.  Erin Morgan 05/26/2018, 4:52 PM

## 2018-05-26 NOTE — Procedures (Signed)
Patient being transported to cath lab at [facility] do STAT echo at this time.

## 2018-05-26 NOTE — ED Notes (Signed)
Date and time results received: 05/26/18 1150 (use smartphrase ".now" to insert current time)  Test: troponin  Critical Value: 3.0  Name of Provider Notified: Dr Vanita Panda   Orders Received? Or Actions Taken?: heparin to start By RX ,  Pt has no complaints of chest pain

## 2018-05-26 NOTE — Progress Notes (Signed)
ANTICOAGULATION CONSULT NOTE - Initial Consult  Pharmacy Consult for heparin Indication: chest pain/ACS  No Known Allergies  Patient Measurements: Weight: 104 lb (47.2 kg)  Vital Signs: Temp: 97.6 F (36.4 C) (05/22 1050) BP: 90/64 (05/22 1145) Pulse Rate: 83 (05/22 1100)  Labs: Recent Labs    05/26/18 1047  HGB 12.6  HCT 39.0  PLT 283  LABPROT 12.4  INR 0.9  CREATININE 9.32*  TROPONINI 3.00*    Estimated Creatinine Clearance: 4.6 mL/min (A) (by C-G formula based on SCr of 9.32 mg/dL (H)).  Assessment: CC/HPI: 63 yo f presenting with hypotension, stemi in the field  PMH: ESRD on PD HTN RA HUS  Anticoag: none pta iv hep for r/o acs   CV: trop 3  Renal: ESRD PD  Heme/Onc: H&H 12.6/39, Plt 283  Goal of Therapy:  Heparin level 0.3-0.7 units/ml Monitor platelets by anticoagulation protocol: Yes   Plan:  Heparin bolus 3000 units x 1 Heparin gtt 550 units/hr Initial HL 1800 Daily HL CBC F/U cards plans  Levester Fresh, PharmD, BCPS, BCCCP Clinical Pharmacist [phone]  Please check AMION for all [facility] numbers  05/26/2018 12:36 PM

## 2018-05-26 NOTE — Consult Note (Addendum)
Renal Service Consult Note [facility]  Erin Morgan 05/26/2018 [place] Requesting Physician:  Dr Linard Millers  Reason for Consult:  ESRD pt w/ atrial fib, hypotension and trop 3.0 HPI: The patient is a 63 y.o. year-old presented to ED for palpitations and prob new atrial fibrillation.  EKG here shows atrial fib and LBBB.  Trop 3.0.  BP's high 80's.  Seen by cardiology, possible LHC this afternoon.  Asked to see for ESRD.    Patient seen last week w/ low K+ and has been taking supplements. Also started on metoprolol 2 days ago for rapid HR and pt states her BP has been low since. Usual BP's are wnl and she takes losartan once daily.    Pt was on HD x 3 yrs w/ L AVF and on PD now since July 2019.  Cause of ESRD per pt was aHUS.    Denies any cough, n/v/d, abd pain, PD problems.      ROS  denies CP  no joint pain   no HA  no blurry vision  no rash   Past Medical History  Past Medical History:  Diagnosis Date  . Anemia   . Chronic kidney disease    dialysis - t/th/sa- [facility]  . Constipation   . Family history of adverse reaction to anesthesia    mom and sister has n/v  . GERD (gastroesophageal reflux disease)   . History of blood transfusion   . Hypertension   . Muscle weakness   . Osteopenia   . PONV (postoperative nausea and vomiting)    Headache  . Rheumatoid arthritis ([place])   . Seasonal allergies   . Thoracic aortic aneurysm ([place]) 07/18/2017   4.0 cm ascending thoracic noted on CT 07/18/2017  . Wears glasses    Past Surgical History  Past Surgical History:  Procedure Laterality Date  . AV FISTULA PLACEMENT Left 11/18/2014   Procedure: BRACHIOCEPHALIC ARTERIOVENOUS (AV) FISTULA CREATION;  Surgeon: Conrad [place], MD;  Location: [facility];  Service: Vascular;  Laterality: Left;  . CARPAL TUNNEL RELEASE Right 04/05/2014   Procedure: RIGHT CARPAL TUNNEL RELEASE;  Surgeon: Daryll Brod, MD;  Location: [facility];   Service: Orthopedics;  Laterality: Right;  ANESTHESIA:  IV REGIONAL FAB  . COLONOSCOPY    . REVISON OF ARTERIOVENOUS FISTULA Left 10/01/2016   Procedure: REVISON OF LEFT ARM ARTERIOVENOUS FISTULA;  Surgeon: Angelia Mould, MD;  Location: [facility];  Service: Vascular;  Laterality: Left;  . REVISON OF ARTERIOVENOUS FISTULA Left 35/00/9381   Procedure: PLICATION OF LEFT ARM  ARTERIOVENOUS FISTULA;  Surgeon: Angelia Mould, MD;  Location: [facility];  Service: Vascular;  Laterality: Left;  . TONSILLECTOMY    . TUMOR EXCISION  2000   right foot   Family History  Family History  Problem Relation Age of Onset  . Hypertension Mother   . Cervical cancer Mother   . Cancer Mother   . Hyperlipidemia Mother   . Hypertension Father   . Stroke Father   . Hyperlipidemia Father   . CAD Father        at age 5  . Valvular heart disease Father        TAVR at age 66  . Healthy Sister   . Healthy Brother   . Rheum arthritis Other    Social History  reports that she has never smoked. She has never used smokeless tobacco. She reports that she does not drink  alcohol or use drugs. Allergies No Known Allergies Home medications Prior to Admission medications   Medication Sig Start Date End Date Taking? Authorizing Provider  acetaminophen (TYLENOL) 500 MG tablet Take 1,000 mg by mouth every 8 (eight) hours as needed for headache.   Yes [provider]  calcitRIOL (ROCALTROL) 0.5 MCG capsule Take 1 mcg by mouth daily. 05/12/18  Yes [provider]  ferric citrate (AURYXIA) 1 GM 210 MG(Fe) tablet Take 420 mg by mouth See admin instructions. Take 2 tablets (420 mg) by mouth with each meal & 2 tablet (210 mg) by mouth with each protein snack   Yes [provider]  fluocinonide (LIDEX) 0.05 % external solution Apply 1 application topically at bedtime. [place] 09/06/16  Yes [provider]  gentamicin cream (GARAMYCIN) 0.1 % Apply 1 application topically daily. For  dialysis catheter   Yes [provider]  hydroxychloroquine (PLAQUENIL) 200 MG tablet Take 200 mg by mouth 2 (two) times daily.   Yes [provider]  losartan (COZAAR) 50 MG tablet Take 50 mg by mouth at bedtime. 01/25/18  Yes [provider]  omeprazole (PRILOSEC) 20 MG capsule Take 20 mg by mouth 2 (two) times daily.    Yes [provider]  rOPINIRole (REQUIP) 0.25 MG tablet Take 0.25 mg by mouth daily. 05/03/18  Yes [provider]  ULTOMIRIS 300 MG/30ML SOLN injection Inject 30 mLs into the vein every 8 (eight) weeks. 05/09/18  Yes [provider]  cephALEXin (KEFLEX) 250 MG capsule Take 1 capsule (250 mg total) by mouth daily. Take once daily AFTER completing peritoneal dialysis Patient not taking: Reported on 05/26/2018 02/21/18   Virgel Manifold, MD  HYDROcodone-acetaminophen (NORCO/VICODIN) 5-325 MG tablet Take 1 tablet by mouth every 4 (four) hours as needed. Patient not taking: Reported on 05/26/2018 02/21/18   Virgel Manifold, MD  oxyCODONE (ROXICODONE) 5 MG immediate release tablet Take 1 tablet (5 mg total) by mouth every 4 (four) hours as needed. Patient not taking: Reported on 05/26/2018 10/31/17   Angelia Mould, MD   Liver Function Tests Recent Labs  Lab 05/26/18 1047  AST 47*  ALT 26  ALKPHOS 44  BILITOT 0.6  PROT 5.7*  ALBUMIN 3.1*   No results for input(s): LIPASE, AMYLASE in the last 168 hours. CBC Recent Labs  Lab 05/26/18 1047  WBC 8.0  NEUTROABS 5.7  HGB 12.6  HCT 39.0  MCV 90.9  PLT 017   Basic Metabolic Panel Recent Labs  Lab 05/26/18 1047  NA 129*  K 3.3*  CL 88*  CO2 23  GLUCOSE 105*  BUN 46*  CREATININE 9.32*  CALCIUM 9.4   Iron/TIBC/Ferritin/ %Sat    Component Value Date/Time   IRON 42 07/17/2014 2244   TIBC 290 07/17/2014 2244   FERRITIN 61 07/17/2014 2244   IRONPCTSAT 14 07/17/2014 2244    Vitals:   05/26/18 1200 05/26/18 1215 05/26/18 1230 05/26/18 1245  BP:  90/71 (!)  86/65 (!) 87/59  Pulse:   77 77  Resp:  16 16 16   Temp:      SpO2:   100% 100%  Weight: 47.2 kg      Exam Gen thin adult female, no distress, BP's high 80's No rash, cyanosis or gangrene Sclera anicteric, throat clear  No jvd or bruits Chest occ basilar crackles, mostly clear Cor irreg irreg no mrg Abd soft ntnd no mass or ascites +bs  +LLQ PD cath intact GU defer MS no joint  effusions or deformity Ext no LE edema, no wounds or ulcers Neuro is alert, Ox 3 , nf LUA AVF+bruit    Home meds:  - losartan 50 hs  - hydroxychloroquine 200 bid/ ultomiris 30 cc every 8 wks  - omeprazole 40 qd/ ferric citrate 420mg  ac tid  - ropinirole 0.25 qd/ prn oxy IR/ prn norco    CCPD: at [facility] x 1 yr (on HD x 13yrs prior)  Hangs 10 L fluid (1.5x 2 or 1.5 and 2.5), does 1500 cc 1st , then 2000 x 3 then 1500 last dwell. No daybag, no pause   K+ 3.3,  Mg 1.3   Assessment: 1. ESRD on CCPD: will plan on PD tonight w/ lowest UF (all 1.5%) 2. Hypotension - hold home losartan. Cautious IVF"s as needed, she appears to have room for volume on exam 3. Atrial fib - new, per cardiology, started on IV heparin 4. +trop - per cardiology, possible LHC today 5. HTN - hold meds for now, BP's soft 6. H/o RA 7. Hypokalemia/ hypomagnesemia - replace K+ po and Mg IV    Plan: 1. As above      Kelly Splinter  MD 05/26/2018, 2:52 PM

## 2018-05-26 NOTE — H&P (Signed)
The patient has been seen in conjunction with Daune Perch, NP-. All aspects of care have been considered and discussed. The patient has been personally interviewed, examined, and all clinical data has been reviewed.   Elevated troponin I (3.0) without chest pain in 63 yo with ESRD on peritoneal dialysis and 2-3 week history of palpitations and fatigue. Sent to ER from PCP office after ECG (see below).  No obstructive CAD by cath 2017.  Exam unremarkable  Potassium 3.3 ad Mag 1.4  ECG with atrial tachycardia and intermittent 2:1 and 1:1 AV conduction. Prolonged QTc  Diagnoses: 1. NSTEMI: Type 1 vs 2;  2. PAT with AVB; 3. Long QT  Long QT related to multiple factors: hypokalemia, hypomagnesemia, and Hydro-chloroquine.  Plan: Coronary angiography; replete potassium and magnesium per nephrology advice, then IV amiodarone to convert rhythm. Unable to use verapamil/beta blocker due to low BP  Consult nephrology  May need EP  May need electrical cardioversion although not hemodynamically compromised.  Urgent echocardiogram   Cardiology Admission History and Physical:   Patient ID: Erin Morgan MRN: [id number]; DOB: 24-Sep-1955   Admission date: 05/26/2018  Primary Care Provider: Aretta Nip, MD Primary Cardiologist: [facility] Primary Electrophysiologist:  None   Chief Complaint:  hypotension  Patient Profile:   Erin Morgan is a 63 y.o. female with ESRD, hemolytic uremic syndrome, on peritoneal dialysis, hypertension, rheumatoid arthritis.   History of Present Illness:   Erin Morgan is followed at [facility] by cardiology for pre-transplant evaluation. Cardiac catheterization in 10/2015 at [facility] showed non-obstructive CAD with at worst 30% prox and may represent a  component of spasm. Large RCA dist. No sig LAD/LCX disease. Nothing to explain stress test in major epicardial vessels, may have a component of microvascular disease/HTN. Med tx.  A nuclear stress test  in 09/2016 showed Small fixed perfusion defect in the anterior apical wall of the left ventricle. No inducible ischemia is identified.  Echocardiogram 09/10/2016 showed mild concentric LVH with normal LV systolic function, EF 28-78%, no segmental wall motion abnormality.   Erin Morgan is a very pleasant lady who works full time as Emergency planning/management officer at [employer]. She has recently been working from home due the pandemic. She is usually quite active although does not regularly exercise, however, she has been mostly inactive with the COVID precautions. She has not had any exertional chest pain/presure or shortness of breath. She has also had no orthopnea, PND or edema.   Erin Morgan does peritoneal dialysis at home, for the last 4 or so years since her kidneys failed. She says that other than that she has been very healthy. She monitors her VS twice a day and has noted an elevated heart rate 100-130 for the last week. Her BP usually runs around 120/80. She was seen by nephrology 2 days ago who felt that her HR was irregular and arranged for her to been seen by her PCP today for EKG. They also started metoprolol 1/2 pill (unknown strength). At her PCP today her EKG was noted to ba abnormal and she was sent to the ED. The patient says that she is not having any specific symptoms other than some occ mild palpitations.  Her BP has been running low in the 80's-90's since she started the metoprolol. She says this is unusual for her. Also her dry wt has been a little low and potassium was low last week so her nephrologist had advised her not to take off so much fluid with  her PD, which she started last night.    Past Medical History:  Diagnosis Date  . Anemia   . Chronic kidney disease    dialysis - t/th/sa- [place]  . Constipation   . Family history of adverse reaction to anesthesia    mom and sister has n/v  . GERD (gastroesophageal reflux disease)   . History of blood transfusion   .  Hypertension   . Muscle weakness   . Osteopenia   . PONV (postoperative nausea and vomiting)    Headache  . Rheumatoid arthritis ([place])   . Seasonal allergies   . Thoracic aortic aneurysm ([place]) 07/18/2017   4.0 cm ascending thoracic noted on CT 07/18/2017  . Wears glasses     Past Surgical History:  Procedure Laterality Date  . AV FISTULA PLACEMENT Left 11/18/2014   Procedure: BRACHIOCEPHALIC ARTERIOVENOUS (AV) FISTULA CREATION;  Surgeon: Conrad Longstreet, MD;  Location: [place];  Service: Vascular;  Laterality: Left;  . CARPAL TUNNEL RELEASE Right 04/05/2014   Procedure: RIGHT CARPAL TUNNEL RELEASE;  Surgeon: Daryll Brod, MD;  Location: [place];  Service: Orthopedics;  Laterality: Right;  ANESTHESIA:  IV REGIONAL FAB  . COLONOSCOPY    . REVISON OF ARTERIOVENOUS FISTULA Left 10/01/2016   Procedure: REVISON OF LEFT ARM ARTERIOVENOUS FISTULA;  Surgeon: Angelia Mould, MD;  Location: [place];  Service: Vascular;  Laterality: Left;  . REVISON OF ARTERIOVENOUS FISTULA Left 19/37/9024   Procedure: PLICATION OF LEFT ARM  ARTERIOVENOUS FISTULA;  Surgeon: Angelia Mould, MD;  Location: [place];  Service: Vascular;  Laterality: Left;  . TONSILLECTOMY    . TUMOR EXCISION  2000   right foot     Medications Prior to Admission: Prior to Admission medications   Medication Sig Start Date End Date Taking? Authorizing Provider  acetaminophen (TYLENOL) 500 MG tablet Take 1,000 mg by mouth every 8 (eight) hours as needed for headache.    [provider]  cephALEXin (KEFLEX) 250 MG capsule Take 1 capsule (250 mg total) by mouth daily. Take once daily AFTER completing peritoneal dialysis 02/21/18   Virgel Manifold, MD  ferric citrate (AURYXIA) 1 GM 210 MG(Fe) tablet Take 420 mg by mouth See admin instructions. Take 2 tablets (420 mg) by mouth with each meal & 2 tablet (210 mg) by mouth with each protein snack    [provider]  fluocinonide (LIDEX) 0.05 % external  solution Apply 1 application topically at bedtime. [place] 09/06/16   [provider]  gentamicin cream (GARAMYCIN) 0.1 % Apply 1 application topically daily. For dialysis catheter    [provider]  hydrALAZINE (APRESOLINE) 100 MG tablet Take 100 mg by mouth 3 (three) times daily.     [provider]  HYDROcodone-acetaminophen (NORCO/VICODIN) 5-325 MG tablet Take 1 tablet by mouth every 4 (four) hours as needed. 02/21/18   Virgel Manifold, MD  hydroxychloroquine (PLAQUENIL) 200 MG tablet Take 200 mg by mouth 2 (two) times daily.    [provider]  labetalol (NORMODYNE) 200 MG tablet Take 200 mg by mouth 2 (two) times daily.  10/23/14   [provider]  omeprazole (PRILOSEC) 20 MG capsule Take 20 mg by mouth 2 (two) times daily.     [provider]  oxyCODONE (ROXICODONE) 5 MG immediate release tablet Take 1 tablet (5 mg total) by mouth every 4 (four) hours as needed. 10/31/17   Angelia Mould, MD  SOLIRIS 300 MG/30ML SOLN injection  Inject 300 mg into the vein every 14 (fourteen) days.  09/16/16   [provider]     Allergies:   No Known Allergies  Social History:   Social History   Socioeconomic History  . Marital status: Widowed    Spouse name: Not on file  . Number of children: 0  . Years of education: 69  . Highest education level: Doctorate  Occupational History  . Not on file  Social Needs  . Financial resource strain: Not on file  . Food insecurity:    Worry: Not on file    Inability: Not on file  . Transportation needs:    Medical: Not on file    Non-medical: Not on file  Tobacco Use  . Smoking status: Never Smoker  . Smokeless tobacco: Never Used  Substance and Sexual Activity  . Alcohol use: No    Comment:  no  . Drug use: No  . Sexual activity: Never  Lifestyle  . Physical activity:    Days per week: Not on file    Minutes per session: Not on file  . Stress: Not on file   Relationships  . Social connections:    Talks on phone: Not on file    Gets together: Not on file    Attends religious service: Not on file    Active member of club or organization: Not on file    Attends meetings of clubs or organizations: Not on file    Relationship status: Not on file  . Intimate partner violence:    Fear of current or ex partner: Not on file    Emotionally abused: Not on file    Physically abused: Not on file    Forced sexual activity: Not on file  Other Topics Concern  . Not on file  Social History Narrative   Lives at home alone.   Right-handed.   Occasional caffeine use.    Family History:   The patient's family history includes CAD in her father; Cancer in her mother; Cervical cancer in her mother; Healthy in her brother and sister; Hyperlipidemia in her father and mother; Hypertension in her father and mother; Rheum arthritis in an other family member; Stroke in her father; Valvular heart disease in her father.    ROS:  Please see the history of present illness.  All other ROS reviewed and negative.     Physical Exam/Data:   Vitals:   05/26/18 1200 05/26/18 1215 05/26/18 1230 05/26/18 1245  BP:  90/71 (!) 86/65 (!) 87/59  Pulse:   77 77  Resp:  16 16 16   Temp:      SpO2:   100% 100%  Weight: 47.2 kg      No intake or output data in the 24 hours ending 05/26/18 1324 Last 3 Weights 05/26/2018 12/14/2017 10/31/2017  Weight (lbs) 104 lb 108 lb 110 lb  Weight (kg) 47.174 kg 48.988 kg 49.896 kg  Some encounter information is confidential and restricted. Go to Review Flowsheets activity to see all data.     Body mass index is 17.85 kg/m.  General:  Well nourished, well developed, in no acute distress HEENT: normal Lymph: no adenopathy Neck: no JVD Endocrine:  No thryomegaly Vascular: No carotid bruits; FA pulses 2+ bilaterally without bruits  Cardiac:  normal S1, S2; irregular rate and rhythm; no murmur  Lungs:  clear to auscultation  bilaterally, no wheezing, rhonchi or rales  Abd: soft, nontender, no hepatomegaly  Ext: no edema Musculoskeletal:  No deformities, BUE and BLE strength normal and equal Skin: warm and dry  Neuro:  CNs 2-12 intact, no focal abnormalities noted Psych:  Normal affect    EKG:  The ECG that was done was personally reviewed and demonstrates PAT variable AV conduction  Relevant CV Studies:  Nuclear stress test at [facility] 09/11/2016 FINDINGS: Physiological distribution of the radiotracer. No unexpected findings on raw images. No transient ischemic dilatation. Rest and stress patterns of uptake are similar. Small area of moderately decreased uptake in the left ventricular apical anterior wall.  CONCLUSION: Small fixed perfusion defect in the anterior apical wall of the left ventricle.  No inducible ischemia is identified.  Echocardiogram at [facility] 09/10/2016 SUMMARY The left ventricular size is normal. There is mild concentric left ventricular hypertrophy.  Left ventricular systolic function is normal. No segmental wall motion abnormalities seen in the left ventricle  Left ventricular filling pattern is pseudonormal. The right ventricle is normal in size and function. The left atrial volume is mildly increased. There is no significant valvular stenosis or regurgitation There was insufficient TR detected to calculate RV systolic pressure. There is no pericardial effusion. Compared to the prior study of 09/15/2015, there is now mild LVH with elevated  LA pressures. Otherwise no significant change.  Cardiac catheterization 10/13/2015 at [facility]  Non-obstructive coronary artery disease. Minimal CAD R Dom with what is at worst 30% prox and may represent a  component of spasm. Large RCA dist. No sig LAD/LCX disease. Nothing to  explain stress test in major epicardial vessels, may have a component of  microvascular disease/HTN. Med tx. LVEDP only. NO specimens, TR for hemostasis. EBL <33mL.    Laboratory Data:  Chemistry Recent Labs  Lab 05/26/18 1047  NA 129*  K 3.3*  CL 88*  CO2 23  GLUCOSE 105*  BUN 46*  CREATININE 9.32*  CALCIUM 9.4  GFRNONAA 4*  GFRAA 5*  ANIONGAP 18*    Recent Labs  Lab 05/26/18 1047  PROT 5.7*  ALBUMIN 3.1*  AST 47*  ALT 26  ALKPHOS 44  BILITOT 0.6   Hematology Recent Labs  Lab 05/26/18 1047  WBC 8.0  RBC 4.29  HGB 12.6  HCT 39.0  MCV 90.9  MCH 29.4  MCHC 32.3  RDW 15.0  PLT 283   Cardiac Enzymes Recent Labs  Lab 05/26/18 1047  TROPONINI 3.00*   No results for input(s): TROPIPOC in the last 168 hours.  BNPNo results for input(s): BNP, PROBNP in the last 168 hours.  DDimer No results for input(s): DDIMER in the last 168 hours.  Radiology/Studies:  Dg Chest Portable 1 View  Result Date: 05/26/2018 CLINICAL DATA:  Elevated heart right and low blood pressure EXAM: PORTABLE CHEST 1 VIEW COMPARISON:  06/08/2017 FINDINGS: Cardiomegaly, mild. Negative mediastinal contours. There is no edema, consolidation, effusion, or pneumothorax. IMPRESSION: Mild cardiomegaly. Clear lungs. Electronically Signed   By: Monte Fantasia M.D.   On: 05/26/2018 11:21    Assessment and Plan:   1. Elevated troponin -Pt denies any current or recent chest discomfort or shortness of breath. Feels a little tired.  -Troponin is 3. -EKG with conduction delay. -Her CVD risk factor include HTN (only since her kidney failure). Father had CAD at advanced age. No diabetes.  -Pt had cath in 10/2015 that showed at worst 30% prox RCA and may represent a component of spasm as workup for renal transplant at [facility]. -She is currently on heparin drip for possible ACS given elevated troponin.  -Dr.  Tamala Julian to see for further plans. Likely plan for cardiac cath to definitively evaluate for CAD.  -Will also check echo for LV function.   2. Possible arrhythmia -Pt with elevated HR in the last week. Occ faint palpitations.  -EKG with atrial tachycardia, conduction  delay. Will review with Dr. Tamala Julian.  -If afib, would need to consider anticoagulation for stroke risk reduction. CHA2DS2/VAS Stroke Risk Score at least 2 (HTN, female)  -Will hold plaquenil for now.  -Low BP makes medical therapy difficult.  -Case discussed with EP. Will start amiodarone.   3. ESRD -Result of atypical hemolytic uremic syndrome, on PD for about 4 years.  -recently below dry wt so ws told to not take of so much fluid.  -Will consult nephrology to manage dialysis. Called and they will see.   4. Hypertension -Usually well controlled. No longer on hydralazine and labetalol. Now only takes losartan 1/2 pill, unknown strength. BP now low after pt started metoprolol for possible afib. Will hold for now in setting of hypotension.   Severity of Illness: The appropriate patient status for this patient is INPATIENT. Inpatient status is judged to be reasonable and necessary in order to provide the required intensity of service to ensure the patient's safety. The patient's presenting symptoms, physical exam findings, and initial radiographic and laboratory data in the context of their chronic comorbidities is felt to place them at high risk for further clinical deterioration. Furthermore, it is not anticipated that the patient will be medically stable for discharge from the hospital within 2 midnights of admission. The following factors support the patient status of inpatient.   " The patient's presenting symptoms include low BP, arrhtymia. " The worrisome physical exam findings include normal exam except tachycardia, irregular.   The initial radiographic and laboratory data are worrisome because of  abnormal EKG, elevated troponin, hypotension. . " The chronic co-morbidities include ESRD on PD, hypertension.   * I certify that at the point of admission it is my clinical judgment that the patient will require inpatient hospital care spanning beyond 2 midnights from the point of admission  due to high intensity of service, high risk for further deterioration and high frequency of surveillance required.*    For questions or updates, please contact [place] Please consult [email or web address] for contact info under     Signed, Daune Perch, NP  05/26/2018 1:24 PM

## 2018-05-26 NOTE — Progress Notes (Addendum)
   Coronary arteries are clean  LVEDP is 8 mmHg  HR 80 bpm, 2:1 AV conduction.  Awaiting Echo LV assessment.  Rhythm management may require EP help, ? Ablation vs Amio (as recommended by curbside with [place]) vs electrical cardioversion.

## 2018-05-26 NOTE — ED Triage Notes (Signed)
Pt here from, MD office with c/o hypotension , called [facility] in the field , pt has no complaints of chest pain , no n/v or sob

## 2018-05-26 NOTE — Interval H&P Note (Signed)
History and Physical Interval Note:  05/26/2018 3:15 PM  Erin Morgan  has presented today for surgery, with the diagnosis of elevated trop.  The various methods of treatment have been discussed with the patient and family. After consideration of risks, benefits and other options for treatment, the patient has consented to  Procedure(s): LEFT HEART CATH AND CORONARY ANGIOGRAPHY (N/A) as a surgical intervention.  The patient's history has been reviewed, patient examined, no change in status, stable for surgery.  I have reviewed the patient's chart and labs.  Questions were answered to the patient's satisfaction.   Cath Lab Visit (complete for each Cath Lab visit)  Clinical Evaluation Leading to the Procedure:   ACS: Yes.    Non-ACS:    Anginal Classification: CCS I  Anti-ischemic medical therapy: No Therapy  Non-Invasive Test Results: No non-invasive testing performed  Prior CABG: No previous CABG        [name] 05/26/2018 3:15 PM

## 2018-05-26 NOTE — ED Notes (Signed)
Nurse navigator spoke with patient resting comfortably and stated spoke with family already and has her own phone. She will contact family when needed.

## 2018-05-27 DIAGNOSIS — I480 Paroxysmal atrial fibrillation: Secondary | ICD-10-CM

## 2018-05-27 LAB — BASIC METABOLIC PANEL
Anion gap: 13 (ref 5–15)
BUN: 45 mg/dL — ABNORMAL HIGH (ref 8–23)
CO2: 23 mmol/L (ref 22–32)
Calcium: 8.6 mg/dL — ABNORMAL LOW (ref 8.9–10.3)
Chloride: 94 mmol/L — ABNORMAL LOW (ref 98–111)
Creatinine, Ser: 8.68 mg/dL — ABNORMAL HIGH (ref 0.44–1.00)
GFR calc Af Amer: 5 mL/min — ABNORMAL LOW (ref >60)
GFR calc non Af Amer: 4 mL/min — ABNORMAL LOW (ref >60)
Glucose, Bld: 116 mg/dL — ABNORMAL HIGH (ref 70–99)
Potassium: 3.2 mmol/L — ABNORMAL LOW (ref 3.5–5.1)
Sodium: 130 mmol/L — ABNORMAL LOW (ref 135–145)

## 2018-05-27 LAB — CBC
HCT: 31.6 % — ABNORMAL LOW (ref 36.0–46.0)
Hemoglobin: 10.6 g/dL — ABNORMAL LOW (ref 12.0–15.0)
MCH: 29.9 pg (ref 26.0–34.0)
MCHC: 33.5 g/dL (ref 30.0–36.0)
MCV: 89.3 fL (ref 80.0–100.0)
Platelets: 227 10*3/uL (ref 150–400)
RBC: 3.54 MIL/uL — ABNORMAL LOW (ref 3.87–5.11)
RDW: 14.8 % (ref 11.5–15.5)
WBC: 5.4 10*3/uL (ref 4.0–10.5)
nRBC: 0 % (ref 0.0–0.2)

## 2018-05-27 LAB — LIPID PANEL
Cholesterol: 235 mg/dL — ABNORMAL HIGH (ref 0–200)
HDL: 44 mg/dL (ref >40)
LDL Cholesterol: 179 mg/dL — ABNORMAL HIGH (ref 0–99)
Total CHOL/HDL Ratio: 5.3 RATIO
Triglycerides: 61 mg/dL (ref <150)
VLDL: 12 mg/dL (ref 0–40)

## 2018-05-27 LAB — MAGNESIUM: Magnesium: 2.2 mg/dL (ref 1.7–2.4)

## 2018-05-27 MED ORDER — RENA-VITE PO TABS
1.0000 | ORAL_TABLET | Freq: Every day | ORAL | Status: DC
  Filled 2018-05-27: qty 1

## 2018-05-27 MED ORDER — SENNOSIDES-DOCUSATE SODIUM 8.6-50 MG PO TABS
1.0000 | ORAL_TABLET | Freq: Every evening | ORAL | Status: DC | PRN
  Administered 2018-05-27: 19:00:00 1 via ORAL
  Filled 2018-05-27: qty 1

## 2018-05-27 MED ORDER — DILTIAZEM HCL ER COATED BEADS 120 MG PO CP24
120.0000 mg | ORAL_CAPSULE | Freq: Every day | ORAL | Status: DC
  Administered 2018-05-27: 12:00:00 120 mg via ORAL
  Filled 2018-05-27: qty 1

## 2018-05-27 MED ORDER — POTASSIUM CHLORIDE CRYS ER 20 MEQ PO TBCR
40.0000 meq | EXTENDED_RELEASE_TABLET | Freq: Once | ORAL | Status: AC
  Administered 2018-05-27: 09:00:00 40 meq via ORAL
  Filled 2018-05-27: qty 2

## 2018-05-27 MED ORDER — DILTIAZEM HCL-DEXTROSE 100-5 MG/100ML-% IV SOLN (PREMIX)
5.0000 mg/h | INTRAVENOUS | Status: DC
  Administered 2018-05-27: 5 mg/h via INTRAVENOUS
  Filled 2018-05-27: qty 100

## 2018-05-27 MED ORDER — PANTOPRAZOLE SODIUM 40 MG PO TBEC
40.0000 mg | DELAYED_RELEASE_TABLET | ORAL | Status: DC
  Administered 2018-05-27 – 2018-05-28 (×2): 40 mg via ORAL
  Filled 2018-05-27 (×2): qty 1

## 2018-05-27 NOTE — Progress Notes (Signed)
Pt having Afib with RVR in the 140's. Pt copmplains of nausea and not feeling good. Pt refused IV Zofran, pt educated. MD on call notified and ordered to start Diltiazem IV.

## 2018-05-27 NOTE — Progress Notes (Addendum)
[facility] Progress Note   Dialysis Orders: CCPD: at [facility] x 1 yr (on HD x 42yrs prior)  Hangs 10 L fluid (1.5x 2 or 1.5 and 2.5), does 1500 cc 1st , then 2000 x 3 then 1500 last dwell. No daybag, no pause   K+ 3.3,  Mg 1.3  Assessment/Plan: 1. + troponin's/afib-  left heart CAD "clean", may need amio vs ablation -plans per cards  Likely to start IV amio 2. ESRD - CCPD - continue same orders with 1.5s - monitor electrolytes daily - outpatient Na tends to run 130 - 133 3. Anemia - hgb 10.6 -(last outpatient hgb 12.8 5/21 not on ESA last tsat high - 4. Secondary hyperparathyroidism - on Auryxia/calcitriol 5. Hypotension/volume - used all 1.5s last night -holding losartan 50 - BP remains low - continue with 1.5s - net UF 1 L - usual net UF 1.4 6. Nutrition - on heart healthy diet - tends to be high in K 7. Hypokalemia and hypomagnesemia - K 3.2s still low - Mg 2.2 corrected - give additional 40 K today - discussed ^ K in [place], PA-C [place] 05/27/2018,9:23 AM  LOS: 1 day   Pt seen, examined and agree w A/P as above.   Kelly Splinter  MD 05/27/2018, 10:48 AM    Subjective:   Doesn't feel ^ heart rate.  Just has felt tired.  Weight in usual range.   Objective Vitals:   05/27/18 0300 05/27/18 0448 05/27/18 0755 05/27/18 0813  BP: 91/62 96/66  99/61  Pulse:  76  (!) 106  Resp: 18 18 16    Temp:  98 F (36.7 C) 97.8 F (36.6 C)   TempSrc:  Oral Oral   SpO2:  100%    Weight:  50.6 kg 48.7 kg   Height:       Physical Exam General:  NAD sitting up in bed Heart: tachy rate 120 - 130s on minitor afib Lungs: no rales Abdomen: soft NT Extremities: no LE edema/SCDs in place Dialysis Access:  Left upper AVF + bruit and LLQ PD cath   Additional Objective Labs: Basic Metabolic Panel: Recent Labs  Lab 05/26/18 1047 05/27/18 0418  NA 129* 130*  K 3.3* 3.2*  CL 88* 94*  CO2 23 23  GLUCOSE 105* 116*  BUN 46* 45*   CREATININE 9.32* 8.68*  CALCIUM 9.4 8.6*   Liver Function Tests: Recent Labs  Lab 05/26/18 1047  AST 47*  ALT 26  ALKPHOS 44  BILITOT 0.6  PROT 5.7*  ALBUMIN 3.1*   No results for input(s): LIPASE, AMYLASE in the last 168 hours. CBC: Recent Labs  Lab 05/26/18 1047 05/27/18 0418  WBC 8.0 5.4  NEUTROABS 5.7  --   HGB 12.6 10.6*  HCT 39.0 31.6*  MCV 90.9 89.3  PLT 283 227   Blood Culture    Component Value Date/Time   SDES URINE, CLEAN CATCH 07/17/2014 2115   SPECREQUEST NONE 07/17/2014 2115   CULT  07/17/2014 2115    MULTIPLE SPECIES PRESENT, SUGGEST RECOLLECTION IF CLINICALLY INDICATED   REPTSTATUS 07/19/2014 FINAL 07/17/2014 2115    Cardiac Enzymes: Recent Labs  Lab 05/26/18 1047  TROPONINI 3.00*   CBG: Recent Labs  Lab 05/26/18 1102  GLUCAP 87   Iron Studies: No results for input(s): IRON, TIBC, TRANSFERRIN, FERRITIN in the last 72 hours. Lab Results  Component Value Date   INR 0.9 05/26/2018   INR 1.01 07/22/2014  INR 0.98 07/18/2014   Studies/Results: Dg Chest Portable 1 View  Result Date: 05/26/2018 CLINICAL DATA:  Elevated heart right and low blood pressure EXAM: PORTABLE CHEST 1 VIEW COMPARISON:  06/08/2017 FINDINGS: Cardiomegaly, mild. Negative mediastinal contours. There is no edema, consolidation, effusion, or pneumothorax. IMPRESSION: Mild cardiomegaly. Clear lungs. Electronically Signed   By: Monte Fantasia M.D.   On: 05/26/2018 11:21   Medications: . sodium chloride    . sodium chloride    . dialysis solution 1.5% low-MG/low-CA     . aspirin EC  81 mg Oral Daily  . calcitRIOL  1 mcg Oral Daily  . ferric citrate  420 mg Oral TID WC  . fluocinonide cream  1 application Topical QHS  . gentamicin cream  1 application Topical Daily  . multivitamin  1 tablet Oral QHS  . pantoprazole  40 mg Oral Daily  . rOPINIRole  0.25 mg Oral Daily  . sodium chloride flush  3 mL Intravenous Q12H  . sodium chloride flush  3 mL Intravenous Q12H

## 2018-05-27 NOTE — Progress Notes (Signed)
TR BAND REMOVAL  LOCATION:    right radial  DEFLATED PER PROTOCOL:    Yes.    TIME BAND OFF / DRESSING APPLIED:    1940   SITE UPON ARRIVAL:    Level 0  SITE AFTER BAND REMOVAL:    Level 0  CIRCULATION SENSATION AND MOVEMENT:    Within Normal Limits   Yes.    COMMENTS:   Post TR band instructions given, applied sterile dressing, good capillary refill.

## 2018-05-27 NOTE — Progress Notes (Signed)
Approximated 8325, seen pt in room alert/oriented in no acute distrress. Introduced to pt that this HD staff would like to start her PD tx . Pt stated " I would like to go to the bathroom first:" Informed pt to let us know when she's ready for her tx.   Primary nurse Orson Slick made aware of above and to inform HD uni HD charge nurse made aware.

## 2018-05-27 NOTE — Progress Notes (Signed)
Recevied a call from [place] stqaff that pt is ready for her PD tx.  Seen pt in room .checked pt's lD label for identification. Vital signs checked, Dressing changed prior to PD tx. PD tx initiated as ordered. . Pt asked this writer for a connector to disconnedt herself.  If she needs to go to the bathroom.  Explained to pt that per PD policy we wont allow pt to connect and disconnect herself  for infection control. Pt upset and stated " ive been doing this at home and those lines wont be long enough . Explained to pt the best option is to move the PD cycler  Closer to the bathroom side. But pt insisted to leave  the PD cycler as it is. Primary nurse made aware of above and HD charge nurse made aware.

## 2018-05-27 NOTE — Consult Note (Signed)
ELECTROPHYSIOLOGY CONSULT NOTE    Primary Care Physician: Aretta Nip, MD Referring Physician:  Dr Acie Fredrickson  Admit Date: 05/26/2018  Reason for consultation:  tachycardia  Erin Morgan is a 63 y.o. female with a h/o CRI (on dialysis), HTN, and rheumatoid arthritis who now presents with tachycardia.  She was seen by Dr Tamala Julian in the office 05/26/2018 for pre-transplant evaluation.  She was noted to have mildly elevated troponin as well as atrial tachycardia.  She was admitted for further evaluation.    She underwent cath which revealed no significant CAD.  Cath revealed normal cors. Echo was performed which revealed EF 50-55% with moderate LA enlargement.   She has been observed to have mild hypotension without symptoms.  She continues to have salvos of atrial tachycardia.  Ep is consulted for further evaluation.  The patient reports having fatigue/ weakness for several weeks.  She feels "washed out".  She is otherwise without complaint.  Today, she denies symptoms of palpitations, chest pain, shortness of breath, orthopnea, PND, lower extremity edema, dizziness, presyncope, syncope, or neurologic sequela. The patient is tolerating medications without difficulties and is otherwise without complaint today.   Past Medical History:  Diagnosis Date  . Anemia   . Chronic kidney disease    dialysis - t/th/sa- [place]  . Constipation   . Family history of adverse reaction to anesthesia    mom and sister has n/v  . GERD (gastroesophageal reflux disease)   . History of blood transfusion   . Hypertension   . Muscle weakness   . Osteopenia   . PONV (postoperative nausea and vomiting)    Headache  . Rheumatoid arthritis ([place])   . Seasonal allergies   . Thoracic aortic aneurysm ([place]) 07/18/2017   4.0 cm ascending thoracic noted on CT 07/18/2017  . Wears glasses    Past Surgical History:  Procedure Laterality Date  . AV FISTULA PLACEMENT Left 11/18/2014   Procedure: BRACHIOCEPHALIC ARTERIOVENOUS (AV) FISTULA CREATION;  Surgeon: Conrad Enders, MD;  Location: [facility];  Service: Vascular;  Laterality: Left;  . CARPAL TUNNEL RELEASE Right 04/05/2014   Procedure: RIGHT CARPAL TUNNEL RELEASE;  Surgeon: Daryll Brod, MD;  Location: [facility];  Service: Orthopedics;  Laterality: Right;  ANESTHESIA:  IV REGIONAL FAB  . COLONOSCOPY    . REVISON OF ARTERIOVENOUS FISTULA Left 10/01/2016   Procedure: REVISON OF LEFT ARM ARTERIOVENOUS FISTULA;  Surgeon: Angelia Mould, MD;  Location: [facility];  Service: Vascular;  Laterality: Left;  . REVISON OF ARTERIOVENOUS FISTULA Left 72/62/0355   Procedure: PLICATION OF LEFT ARM  ARTERIOVENOUS FISTULA;  Surgeon: Angelia Mould, MD;  Location: [facility];  Service: Vascular;  Laterality: Left;  . TONSILLECTOMY    . TUMOR EXCISION  2000   right foot    . aspirin EC  81 mg Oral Daily  . calcitRIOL  1 mcg Oral Daily  . diltiazem  120 mg Oral Daily  . ferric citrate  420 mg Oral TID WC  . fluocinonide cream  1 application Topical QHS  . gentamicin cream  1 application Topical Daily  . multivitamin  1 tablet Oral QHS  . [START ON 05/28/2018] pantoprazole  40 mg Oral Q24H  . rOPINIRole  0.25 mg Oral Daily  . sodium chloride flush  3 mL Intravenous Q12H  . sodium chloride flush  3 mL Intravenous Q12H   . sodium chloride    . sodium chloride    . dialysis solution  1.5% low-MG/low-CA      No Known Allergies  Social History   Socioeconomic History  . Marital status: Widowed    Spouse name: Not on file  . Number of children: 0  . Years of education: 109  . Highest education level: Doctorate  Occupational History  . Not on file  Social Needs  . Financial resource strain: Not on file  . Food insecurity:    Worry: Not on file    Inability: Not on file  . Transportation needs:    Medical: Not on file    Non-medical: Not on file  Tobacco Use  . Smoking status: Never Smoker  . Smokeless tobacco:  Never Used  Substance and Sexual Activity  . Alcohol use: No    Comment:  no  . Drug use: No  . Sexual activity: Never  Lifestyle  . Physical activity:    Days per week: Not on file    Minutes per session: Not on file  . Stress: Not on file  Relationships  . Social connections:    Talks on phone: Not on file    Gets together: Not on file    Attends religious service: Not on file    Active member of club or organization: Not on file    Attends meetings of clubs or organizations: Not on file    Relationship status: Not on file  . Intimate partner violence:    Fear of current or ex partner: Not on file    Emotionally abused: Not on file    Physically abused: Not on file    Forced sexual activity: Not on file  Other Topics Concern  . Not on file  Social History Narrative   Lives at home alone.   Right-handed.   Occasional caffeine use.    Family History  Problem Relation Age of Onset  . Hypertension Mother   . Cervical cancer Mother   . Cancer Mother   . Hyperlipidemia Mother   . Hypertension Father   . Stroke Father   . Hyperlipidemia Father   . CAD Father        at age 44  . Valvular heart disease Father        TAVR at age 110  . Healthy Sister   . Healthy Brother   . Rheum arthritis Other     ROS- All systems are reviewed and negative except as per the HPI above  Physical Exam: Telemetry:  Sinus with salvos of atach Vitals:   05/27/18 0913 05/27/18 0955 05/27/18 1148 05/27/18 1300  BP:   107/75 110/73  Pulse:    93  Resp: 20 19  16   Temp:    98.6 F (37 C)  TempSrc:    Oral  SpO2:    99%  Weight:      Height:        GEN- The patient is well appearing, alert and oriented x 3 today.   Head- normocephalic, atraumatic Eyes-  Sclera clear, conjunctiva pink Ears- hearing intact Oropharynx- clear Neck- supple,   Lungs-  normal work of breathing Heart- Regular rate and rhythm with ectopy GI- soft  Extremities- no clubbing, cyanosis, or edema MS- no  significant deformity or atrophy Skin- no rash or lesion Psych- euthymic mood, full affect Neuro- strength and sensation are intact  EKG- from yesterday reveals 2:1 atrial tachycardia,  Recently developed LBBB is also noted on ekgs  Labs:   Lab Results  Component Value Date   WBC  5.4 05/27/2018   HGB 10.6 (L) 05/27/2018   HCT 31.6 (L) 05/27/2018   MCV 89.3 05/27/2018   PLT 227 05/27/2018    Recent Labs  Lab 05/26/18 1047 05/27/18 0418  NA 129* 130*  K 3.3* 3.2*  CL 88* 94*  CO2 23 23  BUN 46* 45*  CREATININE 9.32* 8.68*  CALCIUM 9.4 8.6*  PROT 5.7*  --   BILITOT 0.6  --   ALKPHOS 44  --   ALT 26  --   AST 47*  --   GLUCOSE 105* 116*   Lab Results  Component Value Date   CKTOTAL 224 (H) 01/20/2017   TROPONINI 3.00 (HH) 05/26/2018    Lab Results  Component Value Date   CHOL 235 (H) 05/27/2018   Lab Results  Component Value Date   HDL 44 05/27/2018   Lab Results  Component Value Date   LDLCALC 179 (H) 05/27/2018   Lab Results  Component Value Date   TRIG 61 05/27/2018   Lab Results  Component Value Date   CHOLHDL 5.3 05/27/2018   No results found for: LDLDIRECT    Echo, cath, and Dr Thompson Caul notes are reviewed.  Pt also discussed today with Dr Acie Fredrickson.  ASSESSMENT AND PLAN:   Ectopic atrial tachycardia The patient continues to have salvos of atrial tachycardia.  Yesterday's ekgs reveal 2:1 tachycardia.  The p wave morphology is upright in V1 and inferior leads and negative in VL.  I suspect that her atrial tachycardia arises from the left atrium, near the L superior PV or atrial appendage. We will try low dose diltiazem as our initial management plan.  We could consider low dose flecainide however she has recently developed LBBB and we would need to follow her conduction very closely with this approach.  Given renal failure, her only other AAD option would be amiodarone. Ablation could also be considered, though hopefully we can control her arrhythmia  with medicines.  Very complicated patient.  A high level of decision making was required for this encounter, including discussion with Dr Acie Fredrickson.  Thompson Grayer, MD 05/27/2018  1:02 PM

## 2018-05-27 NOTE — Progress Notes (Signed)
Progress Note  Patient Name: Erin Morgan Date of Encounter: 05/27/2018  Primary Cardiologist: Darrold Span at [facility] is a 63 year old female with a history of end-stage renal disease.  She is on peritoneal dialysis.  She was admitted with elevated troponin level.  She had some arrhythmias and has been started on amiodarone.  Heart catheterization yesterday revealed no significant coronary artery disease.  Still hypokalemic.   Has been supplemented by nephrology    Inpatient Medications    Scheduled Meds: . aspirin EC  81 mg Oral Daily  . calcitRIOL  1 mcg Oral Daily  . ferric citrate  420 mg Oral TID WC  . fluocinonide cream  1 application Topical QHS  . gentamicin cream  1 application Topical Daily  . multivitamin  1 tablet Oral QHS  . pantoprazole  40 mg Oral Daily  . rOPINIRole  0.25 mg Oral Daily  . sodium chloride flush  3 mL Intravenous Q12H  . sodium chloride flush  3 mL Intravenous Q12H   Continuous Infusions: . sodium chloride    . sodium chloride    . dialysis solution 1.5% low-MG/low-CA     PRN Meds: sodium chloride, acetaminophen, dianeal solution for CAPD/CCPD with heparin, ondansetron (ZOFRAN) IV, sodium chloride flush   Vital Signs    Vitals:   05/27/18 0813 05/27/18 0900 05/27/18 0913 05/27/18 0955  BP: 99/61 96/70    Pulse: (!) 106     Resp:  14 20 19   Temp:      TempSrc:      SpO2:      Weight:      Height:       No intake or output data in the 24 hours ending 05/27/18 1047 Last 3 Weights 05/27/2018 05/27/2018 05/26/2018  Weight (lbs) 107 lb 5.8 oz 111 lb 8.8 oz 106 lb 14.8 oz  Weight (kg) 48.7 kg 50.6 kg 48.5 kg  Some encounter information is confidential and restricted. Go to Review Flowsheets activity to see all data.      Telemetry    NSR with episodes of PAF wit RVR  - Personally Reviewed  ECG     - Personally Reviewed  Physical Exam   GEN:  middle age female,  NAD  Neck: No JVD Cardiac: 2/6  systolic murmur  Respiratory: Clear to auscultation bilaterally. GI: Soft, nontender, non-distended  MS: No edema; No deformity. Neuro:  Nonfocal  Psych: Normal affect   Labs    Chemistry Recent Labs  Lab 05/26/18 1047 05/27/18 0418  NA 129* 130*  K 3.3* 3.2*  CL 88* 94*  CO2 23 23  GLUCOSE 105* 116*  BUN 46* 45*  CREATININE 9.32* 8.68*  CALCIUM 9.4 8.6*  PROT 5.7*  --   ALBUMIN 3.1*  --   AST 47*  --   ALT 26  --   ALKPHOS 44  --   BILITOT 0.6  --   GFRNONAA 4* 4*  GFRAA 5* 5*  ANIONGAP 18* 13     Hematology Recent Labs  Lab 05/26/18 1047 05/27/18 0418  WBC 8.0 5.4  RBC 4.29 3.54*  HGB 12.6 10.6*  HCT 39.0 31.6*  MCV 90.9 89.3  MCH 29.4 29.9  MCHC 32.3 33.5  RDW 15.0 14.8  PLT 283 227    Cardiac Enzymes Recent Labs  Lab 05/26/18 1047  TROPONINI 3.00*   No results for input(s): TROPIPOC in the last 168 hours.   BNP Recent Labs  Lab 05/26/18 1047  BNP >4,500.0*     DDimer No results for input(s): DDIMER in the last 168 hours.   Radiology    Dg Chest Portable 1 View  Result Date: 05/26/2018 CLINICAL DATA:  Elevated heart right and low blood pressure EXAM: PORTABLE CHEST 1 VIEW COMPARISON:  06/08/2017 FINDINGS: Cardiomegaly, mild. Negative mediastinal contours. There is no edema, consolidation, effusion, or pneumothorax. IMPRESSION: Mild cardiomegaly. Clear lungs. Electronically Signed   By: Monte Fantasia M.D.   On: 05/26/2018 11:21    Cardiac Studies     Patient Profile     63 y.o. female  With ESRD, on PD. Admitted with tachycardia and found to have PAF   Assessment & Plan    1.   PAF:  Limited options for antiarrhythmics.   Has not felt well for several weeks.   Negative for covid.   Doing better this am  Will try low dose Cardizem 120 CD and follow  Have discussed with Dr. Rayann Heman.   He will come by to see today for further recommendations.   For questions or updates, please contact [facility] Please consult [email or web address]  for contact info under        Signed, Mertie Moores, MD  05/27/2018, 10:47 AM

## 2018-05-28 ENCOUNTER — Encounter (HOSPITAL_COMMUNITY): Payer: Self-pay | Admitting: Nurse Practitioner

## 2018-05-28 DIAGNOSIS — R9431 Abnormal electrocardiogram [ECG] [EKG]: Secondary | ICD-10-CM

## 2018-05-28 DIAGNOSIS — N2581 Secondary hyperparathyroidism of renal origin: Secondary | ICD-10-CM | POA: Diagnosis not present

## 2018-05-28 DIAGNOSIS — E876 Hypokalemia: Secondary | ICD-10-CM

## 2018-05-28 DIAGNOSIS — I248 Other forms of acute ischemic heart disease: Secondary | ICD-10-CM

## 2018-05-28 DIAGNOSIS — Z4932 Encounter for adequacy testing for peritoneal dialysis: Secondary | ICD-10-CM | POA: Diagnosis not present

## 2018-05-28 DIAGNOSIS — D509 Iron deficiency anemia, unspecified: Secondary | ICD-10-CM | POA: Diagnosis not present

## 2018-05-28 DIAGNOSIS — R7989 Other specified abnormal findings of blood chemistry: Secondary | ICD-10-CM

## 2018-05-28 DIAGNOSIS — I471 Supraventricular tachycardia: Secondary | ICD-10-CM

## 2018-05-28 DIAGNOSIS — D631 Anemia in chronic kidney disease: Secondary | ICD-10-CM | POA: Diagnosis not present

## 2018-05-28 DIAGNOSIS — N186 End stage renal disease: Secondary | ICD-10-CM | POA: Diagnosis not present

## 2018-05-28 DIAGNOSIS — I440 Atrioventricular block, first degree: Secondary | ICD-10-CM

## 2018-05-28 LAB — BASIC METABOLIC PANEL
Anion gap: 14 (ref 5–15)
BUN: 54 mg/dL — ABNORMAL HIGH (ref 8–23)
CO2: 25 mmol/L (ref 22–32)
Calcium: 9.3 mg/dL (ref 8.9–10.3)
Chloride: 91 mmol/L — ABNORMAL LOW (ref 98–111)
Creatinine, Ser: 8.72 mg/dL — ABNORMAL HIGH (ref 0.44–1.00)
GFR calc Af Amer: 5 mL/min — ABNORMAL LOW (ref >60)
GFR calc non Af Amer: 4 mL/min — ABNORMAL LOW (ref >60)
Glucose, Bld: 103 mg/dL — ABNORMAL HIGH (ref 70–99)
Potassium: 4 mmol/L (ref 3.5–5.1)
Sodium: 130 mmol/L — ABNORMAL LOW (ref 135–145)

## 2018-05-28 LAB — MAGNESIUM: Magnesium: 1.8 mg/dL (ref 1.7–2.4)

## 2018-05-28 MED ORDER — DILTIAZEM HCL ER COATED BEADS 180 MG PO CP24: 180.0000 mg | ORAL_CAPSULE | Freq: Every day | ORAL | 6 refills | Status: DC

## 2018-05-28 MED ORDER — RENA-VITE PO TABS: 1.0000 | ORAL_TABLET | Freq: Every day | ORAL | 3 refills | Status: AC

## 2018-05-28 MED ORDER — DILTIAZEM HCL ER COATED BEADS 180 MG PO CP24
180.0000 mg | ORAL_CAPSULE | Freq: Every day | ORAL | Status: DC
  Administered 2018-05-28: 180 mg via ORAL
  Filled 2018-05-28: qty 1

## 2018-05-28 NOTE — Discharge Instructions (Signed)

## 2018-05-28 NOTE — Discharge Summary (Addendum)
Discharge Summary    Patient ID: Erin Morgan MRN: [id number]; DOB: [birth year]  Admit date: 05/26/2018 Discharge date: 05/28/2018  Primary Care Provider: Aretta Nip, MD  Primary Cardiologist: Mertie Moores, MD  Primary Electrophysiologist:  Thompson Grayer, MD   Discharge Diagnoses    Principal Problem:   Ectopic atrial tachycardia [facility]) Active Problems:   Demand ischemia ([place])   ESRD on dialysis [facility])   Essential hypertension   GERD (gastroesophageal reflux disease)   Hypokalemia   Hypomagnesemia   Prolonged QT interval   Allergies No Known Allergies  Diagnostic Studies/Procedures    2D Echocardiogram 5.22.2020  IMPRESSIONS      1. The left ventricle has low normal systolic function, with an ejection fraction of 50-55%. The cavity size was normal. There is mild concentric left ventricular hypertrophy. Left ventricular diastolic Doppler parameters are consistent with  pseudonormalization.  2. The right ventricle has normal systolic function. The cavity was normal. There is no increase in right ventricular wall thickness.  3. Left atrial size was moderately dilated.  4. There is mild mitral annular calcification present. No evidence of mitral valve stenosis.  5. No vegetation on the aortic valve.  6. The aortic valve is tricuspid. Mild thickening of the aortic valve. Mild calcification of the aortic valve. Aortic valve regurgitation was not assessed by color flow Doppler. _____________  Cardiac Catheterization [id number]   LV end diastolic pressure is normal.   1. Normal coronary anatomy 2. Low LVEDP 8 mm Hg.   Plan: assess LV and valvular function by Echo. Telemetry to assess intermittent atrial arrhythmia. _____________    History of Present Illness     63 year old female with a history of end-stage renal disease on peritoneal dialysis, hemolytic uremic syndrome, hypertension, and rheumatoid arthritis who was seen by her nephrologist on May 20 in the  setting of a one-week history of fatigue and palpitations, and was noted to have tachycardia and irregularity to her heart rhythm.  She was placed on metoprolol and arrangements were made for an ECG through her primary care provider's office.  This was performed on May 22 showing a narrow complex tachycardia.  She was sent to the emergency department for further evaluation.  In the ER, her troponin was elevated at 3.0.  ECG showed atrial tachycardia with intermittent 2-1 and one-to-one AV conduction and prolonged QTC.  Magnesium was 1.4 with potassium of 3.3.  It was noted that she was also on hydroxychloroquine at home.She was admitted for further evaluation.  Hospital Course     Consultants: Electrophysiology; nephrology  Given tachycardia and troponin elevation, decision was made to pursue diagnostic catheterization.  This took place on April 22 and showed normal coronary arteries.  Subsequent echocardiography showed normal LV function.  She remained in atrial tachycardia with rates in the low 100s and was placed on diltiazem therapy.  With supplementation of potassium/magnesium, and holding of hydroxychloroquine, QT improved.  Her blood pressure was soft which resulted as holding her losartan.  Electrophysiology was consulted and was felt that her atrial tachycardia most likely arose from the left atrium, near the left superior pulmonic vein or atrial appendage. On low-dose diltiazem, she did have additional runs of SVT throughout May 23, but none this morning.  In that setting, diltiazem was increased to 180 mg daily.  She will be discharged home today in good condition.  We will arrange for early outpatient follow-up with electrophysiology.  Because of her renal disease, amiodarone her only antiarrhythmic drug option.  Flecainide would be a possibility however with a baseline first-degree AV block and IVCD, we would prefer to avoid. _____________  Discharge Vitals Blood pressure 113/70, pulse 81,  temperature 98.1 F (36.7 C), temperature source Oral, resp. rate 17, height 5\' 4"  (1.626 m), weight 48.8 kg, SpO2 98 %.  Filed Weights   05/27/18 0755 05/27/18 1735 05/28/18 0935  Weight: 48.7 kg 50.1 kg 48.8 kg    Labs & Radiologic Studies    CBC Recent Labs    05/26/18 1047 05/27/18 0418  WBC 8.0 5.4  NEUTROABS 5.7  --   HGB 12.6 10.6*  HCT 39.0 31.6*  MCV 90.9 89.3  PLT 283 387   Basic Metabolic Panel Recent Labs    05/27/18 0418 05/28/18 0541  NA 130* 130*  K 3.2* 4.0  CL 94* 91*  CO2 23 25  GLUCOSE 116* 103*  BUN 45* 54*  CREATININE 8.68* 8.72*  CALCIUM 8.6* 9.3  MG 2.2 1.8   Liver Function Tests Recent Labs    05/26/18 1047  AST 47*  ALT 26  ALKPHOS 44  BILITOT 0.6  PROT 5.7*  ALBUMIN 3.1*   Cardiac Enzymes Recent Labs    05/26/18 1047  TROPONINI 3.00*   Fasting Lipid Panel Recent Labs    05/27/18 0418  CHOL 235*  HDL 44  LDLCALC 179*  TRIG 61  CHOLHDL 5.3   _____________  Dg Chest Portable 1 View  Result Date: 05/26/2018 CLINICAL DATA:  Elevated heart right and low blood pressure EXAM: PORTABLE CHEST 1 VIEW COMPARISON:  06/08/2017 FINDINGS: Cardiomegaly, mild. Negative mediastinal contours. There is no edema, consolidation, effusion, or pneumothorax. IMPRESSION: Mild cardiomegaly. Clear lungs. Electronically Signed   By: Monte Fantasia M.D.   On: 05/26/2018 11:21   Disposition   Pt is being discharged home today in good condition.  Follow-up Plans & Appointments    Follow-up Information    Thompson Grayer, MD Follow up.   Specialty:  Cardiology Why:  We will arrange for a follow-up appointment in approximately 2 weeks, and contact you. Contact information: [address] [place] [address] [phone]          Discharge Instructions    Diet - low sodium heart healthy   Complete by:  As directed    Increase activity slowly   Complete by:  As directed       Discharge Medications   Allergies as of  05/28/2018   No Known Allergies     Medication List    STOP taking these medications   cephALEXin 250 MG capsule Commonly known as:  KEFLEX   HYDROcodone-acetaminophen 5-325 MG tablet Commonly known as:  NORCO/VICODIN   hydroxychloroquine 200 MG tablet Commonly known as:  PLAQUENIL   losartan 50 MG tablet Commonly known as:  COZAAR   oxyCODONE 5 MG immediate release tablet Commonly known as:  Roxicodone     TAKE these medications   acetaminophen 500 MG tablet Commonly known as:  TYLENOL Take 1,000 mg by mouth every 8 (eight) hours as needed for headache.   Auryxia 1 GM 210 MG(Fe) tablet Generic drug:  ferric citrate Take 420 mg by mouth See admin instructions. Take 2 tablets (420 mg) by mouth with each meal & 2 tablet (210 mg) by mouth with each protein snack   calcitRIOL 0.5 MCG capsule Commonly known as:  ROCALTROL Take 1 mcg by mouth daily.   diltiazem 180 MG 24 hr capsule Commonly known as:  CARDIZEM CD  Take 1 capsule (180 mg total) by mouth daily. Start taking on:  May 29, 2018   fluocinonide 0.05 % external solution Commonly known as:  LIDEX Apply 1 application topically at bedtime. MIX WITH CETAPHIL   gentamicin cream 0.1 % Commonly known as:  GARAMYCIN Apply 1 application topically daily. For dialysis catheter   multivitamin Tabs tablet Take 1 tablet by mouth at bedtime.   omeprazole 20 MG capsule Commonly known as:  PRILOSEC Take 20 mg by mouth 2 (two) times daily.   rOPINIRole 0.25 MG tablet Commonly known as:  REQUIP Take 0.25 mg by mouth daily.   Ultomiris 300 MG/30ML Soln injection Generic drug:  ravulizumab-cwvz Inject 30 mLs into the vein every 8 (eight) weeks.        Acute coronary syndrome (MI, NSTEMI, STEMI, etc) this admission?:  No.  The elevated Troponin was due to the acute medical illness or demand ischemia.    Outstanding Labs/Studies   None  Duration of Discharge Encounter   Greater than 30 minutes including physician  time.  Signed, Murray Hodgkins, NP 05/28/2018, 11:39 AM

## 2018-05-28 NOTE — Progress Notes (Addendum)
[facility] Progress Note   Dialysis Orders: CCPD: at [facility] x 1 yr (on HD x 49yrs prior)  Hangs 10 L fluid (1.5x 2 or 1.5 and 2.5), does 1500 cc 1st , then 2000 x 3 then 1500 last dwell. No daybag, no pause  Assessment/Plan: 1. + troponin's/afib-  left heart CAD "clean", may need amio vs ablation -plans per cards  - started on cardizem last pm - HR in 80s - NSR but no recent BP 2. ESRD - CCPD - continue same orders with 1.5s - monitor electrolytes daily - outpatient Na tends to run 130 - 133- continue 1.5s for now 3. Anemia - hgb 10.6 -(last outpatient hgb 12.8 5/21 not on ESA last tsat high - 4. Secondary hyperparathyroidism - on Auryxia/calcitriol 5. Hypotension/volume - used all 1.5s last night -holding losartan 50 - BP remains low - continue with 1.5s - net UF 1 L 5/23 - usual net UF 1.4 - net UF 856 this am;  6. Nutrition - on heart healthy diet - tends to be high in K- intake marginal - encouraged intake 7. Hypokalemia and hypomagnesemia - K 3.2s still low - Mg 2.2 corrected - giving supplemental K prn discussed ^ K in diet - up to 4 today - encouraged to continue to eat ^ K foods  Erin Jacobson, PA-C [place] 05/28/2018,8:54 AM  LOS: 2 days   Pt seen, examined and agree w A/P as above.  Erin Splinter  MD 05/28/2018, 12:58 PM     Subjective:  Frustrated with the situation and having to wait for PD RN to disconnect.  Objective Vitals:   05/28/18 0145 05/28/18 0245 05/28/18 0445 05/28/18 0500  BP: 121/81 114/69 105/67   Pulse: 78 80 81   Resp: 18 16 17    Temp:    97.6 F (36.4 C)  TempSrc:    Oral  SpO2: 94% 97% 98%   Weight:      Height:       Physical Exam General:  NAD sitting up in bed Heart: RRR Lungs: no rales Abdomen: soft NT Extremities: no LE edema/SCDs in place Dialysis Access:  Left upper AVF + bruit and LLQ PD cath   Additional Objective Labs: Basic Metabolic Panel: Recent Labs  Lab 05/26/18 1047  05/27/18 0418 05/28/18 0541  NA 129* 130* 130*  K 3.3* 3.2* 4.0  CL 88* 94* 91*  CO2 23 23 25   GLUCOSE 105* 116* 103*  BUN 46* 45* 54*  CREATININE 9.32* 8.68* 8.72*  CALCIUM 9.4 8.6* 9.3   Liver Function Tests: Recent Labs  Lab 05/26/18 1047  AST 47*  ALT 26  ALKPHOS 44  BILITOT 0.6  PROT 5.7*  ALBUMIN 3.1*   No results for input(s): LIPASE, AMYLASE in the last 168 hours. CBC: Recent Labs  Lab 05/26/18 1047 05/27/18 0418  WBC 8.0 5.4  NEUTROABS 5.7  --   HGB 12.6 10.6*  HCT 39.0 31.6*  MCV 90.9 89.3  PLT 283 227   Blood Culture    Component Value Date/Time   SDES URINE, CLEAN CATCH 07/17/2014 2115   SPECREQUEST NONE 07/17/2014 2115   CULT  07/17/2014 2115    MULTIPLE SPECIES PRESENT, SUGGEST RECOLLECTION IF CLINICALLY INDICATED   REPTSTATUS 07/19/2014 FINAL 07/17/2014 2115    Cardiac Enzymes: Recent Labs  Lab 05/26/18 1047  TROPONINI 3.00*   CBG: Recent Labs  Lab 05/26/18 1102  GLUCAP 87   Iron Studies: No results for input(s): IRON,  TIBC, TRANSFERRIN, FERRITIN in the last 72 hours. Lab Results  Component Value Date   INR 0.9 05/26/2018   INR 1.01 07/22/2014   INR 0.98 07/18/2014   Studies/Results: Dg Chest Portable 1 View  Result Date: 05/26/2018 CLINICAL DATA:  Elevated heart right and low blood pressure EXAM: PORTABLE CHEST 1 VIEW COMPARISON:  06/08/2017 FINDINGS: Cardiomegaly, mild. Negative mediastinal contours. There is no edema, consolidation, effusion, or pneumothorax. IMPRESSION: Mild cardiomegaly. Clear lungs. Electronically Signed   By: Monte Fantasia M.D.   On: 05/26/2018 11:21   Medications: . sodium chloride    . sodium chloride    . dialysis solution 1.5% low-MG/low-CA    . diltiazem (CARDIZEM) infusion 5 mg/hr (05/27/18 2041)   . aspirin EC  81 mg Oral Daily  . calcitRIOL  1 mcg Oral Daily  . ferric citrate  420 mg Oral TID WC  . fluocinonide cream  1 application Topical QHS  . gentamicin cream  1 application Topical  Daily  . multivitamin  1 tablet Oral QHS  . pantoprazole  40 mg Oral Q24H  . rOPINIRole  0.25 mg Oral Daily  . sodium chloride flush  3 mL Intravenous Q12H  . sodium chloride flush  3 mL Intravenous Q12H

## 2018-05-28 NOTE — Progress Notes (Signed)
Progress Note   Subjective   Doing well today, the patient denies CP or SOB.  No new concerns.  Wants to go home  Inpatient Medications    Scheduled Meds: . aspirin EC  81 mg Oral Daily  . calcitRIOL  1 mcg Oral Daily  . ferric citrate  420 mg Oral TID WC  . fluocinonide cream  1 application Topical QHS  . gentamicin cream  1 application Topical Daily  . multivitamin  1 tablet Oral QHS  . pantoprazole  40 mg Oral Q24H  . rOPINIRole  0.25 mg Oral Daily  . sodium chloride flush  3 mL Intravenous Q12H  . sodium chloride flush  3 mL Intravenous Q12H   Continuous Infusions: . sodium chloride    . sodium chloride    . dialysis solution 1.5% low-MG/low-CA    . diltiazem (CARDIZEM) infusion 5 mg/hr (05/27/18 2041)   PRN Meds: sodium chloride, acetaminophen, dianeal solution for CAPD/CCPD with heparin, ondansetron (ZOFRAN) IV, senna-docusate, sodium chloride flush   Vital Signs    Vitals:   05/28/18 0245 05/28/18 0445 05/28/18 0500 05/28/18 0935  BP: 114/69 105/67  116/69  Pulse: 80 81  81  Resp: 16 17    Temp:   97.6 F (36.4 C) 98.1 F (36.7 C)  TempSrc:   Oral Oral  SpO2: 97% 98%  98%  Weight:    48.8 kg  Height:        Intake/Output Summary (Last 24 hours) at 05/28/2018 1031 Last data filed at 05/28/2018 0600 Gross per 24 hour  Intake 405.38 ml  Output -  Net 405.38 ml   Filed Weights   05/27/18 0755 05/27/18 1735 05/28/18 0935  Weight: 48.7 kg 50.1 kg 48.8 kg    Telemetry    Sinus.  Episodes of SVT yesterday afternoon and last night are noted.  Improved today. - Personally Reviewed  Physical Exam   GEN- The patient is well appearing, alert and oriented x 3 today.   Head- normocephalic, atraumatic Eyes-  Sclera clear, conjunctiva pink Ears- hearing intact Oropharynx- clear Neck- supple, Lungs-   normal work of breathing Heart- Regular rate and rhythm  GI- soft, NT, ND, + BS Extremities- no clubbing, cyanosis, or edema  MS- no significant  deformity or atrophy Skin- no rash or lesion Psych- euthymic mood, full affect Neuro- strength and sensation are intact   Labs    Chemistry Recent Labs  Lab 05/26/18 1047 05/27/18 0418 05/28/18 0541  NA 129* 130* 130*  K 3.3* 3.2* 4.0  CL 88* 94* 91*  CO2 23 23 25   GLUCOSE 105* 116* 103*  BUN 46* 45* 54*  CREATININE 9.32* 8.68* 8.72*  CALCIUM 9.4 8.6* 9.3  PROT 5.7*  --   --   ALBUMIN 3.1*  --   --   AST 47*  --   --   ALT 26  --   --   ALKPHOS 44  --   --   BILITOT 0.6  --   --   GFRNONAA 4* 4* 4*  GFRAA 5* 5* 5*  ANIONGAP 18* 13 14     Hematology Recent Labs  Lab 05/26/18 1047 05/27/18 0418  WBC 8.0 5.4  RBC 4.29 3.54*  HGB 12.6 10.6*  HCT 39.0 31.6*  MCV 90.9 89.3  MCH 29.4 29.9  MCHC 32.3 33.5  RDW 15.0 14.8  PLT 283 227    Cardiac Enzymes Recent Labs  Lab 05/26/18 1047  TROPONINI 3.00*   No  results for input(s): TROPIPOC in the last 168 hours.      Assessment & Plan    1.  Atrial tachycardia Improved with diltiazem Would avoid IV diltiazem as this is not a home option for her. Convert to diltiazem CD 180mg  daily at this time.  We could consider flecainide however given her first degree AV block and IVCD, I would like to avoid this if possible. Amiodarone would be her only other AAD option  We will continue to follow her arrhythmia after discharge clinically.  2. Elevated troponin Likely demand ischemia due to tachycardia in the setting of ESRD Cath reviewed (no CAD) Stop ASA given anemia  3. Anemia/ ESRD Followed by nephrology  DC to home today Follow-up with me via telehealth visit in the next 2 weeks if schedule allows.  Thompson Grayer MD, [facility] 05/28/2018 10:31 AM

## 2018-05-29 DIAGNOSIS — N186 End stage renal disease: Secondary | ICD-10-CM | POA: Diagnosis not present

## 2018-05-29 DIAGNOSIS — N2581 Secondary hyperparathyroidism of renal origin: Secondary | ICD-10-CM | POA: Diagnosis not present

## 2018-05-29 DIAGNOSIS — D509 Iron deficiency anemia, unspecified: Secondary | ICD-10-CM | POA: Diagnosis not present

## 2018-05-29 DIAGNOSIS — D631 Anemia in chronic kidney disease: Secondary | ICD-10-CM | POA: Diagnosis not present

## 2018-05-29 DIAGNOSIS — E876 Hypokalemia: Secondary | ICD-10-CM | POA: Diagnosis not present

## 2018-05-29 DIAGNOSIS — Z4932 Encounter for adequacy testing for peritoneal dialysis: Secondary | ICD-10-CM | POA: Diagnosis not present

## 2018-05-30 ENCOUNTER — Encounter (HOSPITAL_COMMUNITY): Payer: Self-pay | Admitting: Cardiology

## 2018-05-30 DIAGNOSIS — E876 Hypokalemia: Secondary | ICD-10-CM | POA: Diagnosis not present

## 2018-05-30 DIAGNOSIS — D631 Anemia in chronic kidney disease: Secondary | ICD-10-CM | POA: Diagnosis not present

## 2018-05-30 DIAGNOSIS — Z4932 Encounter for adequacy testing for peritoneal dialysis: Secondary | ICD-10-CM | POA: Diagnosis not present

## 2018-05-30 DIAGNOSIS — N2581 Secondary hyperparathyroidism of renal origin: Secondary | ICD-10-CM | POA: Diagnosis not present

## 2018-05-30 DIAGNOSIS — D509 Iron deficiency anemia, unspecified: Secondary | ICD-10-CM | POA: Diagnosis not present

## 2018-05-30 DIAGNOSIS — N186 End stage renal disease: Secondary | ICD-10-CM | POA: Diagnosis not present

## 2018-05-31 DIAGNOSIS — E876 Hypokalemia: Secondary | ICD-10-CM | POA: Diagnosis not present

## 2018-05-31 DIAGNOSIS — Z4932 Encounter for adequacy testing for peritoneal dialysis: Secondary | ICD-10-CM | POA: Diagnosis not present

## 2018-05-31 DIAGNOSIS — D509 Iron deficiency anemia, unspecified: Secondary | ICD-10-CM | POA: Diagnosis not present

## 2018-05-31 DIAGNOSIS — N2581 Secondary hyperparathyroidism of renal origin: Secondary | ICD-10-CM | POA: Diagnosis not present

## 2018-05-31 DIAGNOSIS — N186 End stage renal disease: Secondary | ICD-10-CM | POA: Diagnosis not present

## 2018-05-31 DIAGNOSIS — D631 Anemia in chronic kidney disease: Secondary | ICD-10-CM | POA: Diagnosis not present

## 2018-06-01 ENCOUNTER — Telehealth: Payer: Self-pay | Admitting: Internal Medicine

## 2018-06-01 DIAGNOSIS — N186 End stage renal disease: Secondary | ICD-10-CM | POA: Diagnosis not present

## 2018-06-01 DIAGNOSIS — Z4932 Encounter for adequacy testing for peritoneal dialysis: Secondary | ICD-10-CM | POA: Diagnosis not present

## 2018-06-01 DIAGNOSIS — N2581 Secondary hyperparathyroidism of renal origin: Secondary | ICD-10-CM | POA: Diagnosis not present

## 2018-06-01 DIAGNOSIS — E876 Hypokalemia: Secondary | ICD-10-CM | POA: Diagnosis not present

## 2018-06-01 DIAGNOSIS — D509 Iron deficiency anemia, unspecified: Secondary | ICD-10-CM | POA: Diagnosis not present

## 2018-06-01 DIAGNOSIS — D631 Anemia in chronic kidney disease: Secondary | ICD-10-CM | POA: Diagnosis not present

## 2018-06-01 NOTE — Telephone Encounter (Signed)
Call returned to Pt.  Left detailed message per DPR.  Advised current appointment is first available with Dr. Rayann Heman.  Advised to call office if any further needs.

## 2018-06-01 NOTE — Telephone Encounter (Signed)
New Message    STAT if HR is under 50 or over 120 (normal HR is 60-100 beats per minute)  1) What is your heart rate? Resting HR 85   2) Do you have a log of your heart rate readings (document readings)? No, but she takes her vitals twice a day   3) Do you have any other symptoms? No other symptoms   Pt would like to be seen sooner

## 2018-06-02 DIAGNOSIS — D631 Anemia in chronic kidney disease: Secondary | ICD-10-CM | POA: Diagnosis not present

## 2018-06-02 DIAGNOSIS — N186 End stage renal disease: Secondary | ICD-10-CM | POA: Diagnosis not present

## 2018-06-02 DIAGNOSIS — E876 Hypokalemia: Secondary | ICD-10-CM | POA: Diagnosis not present

## 2018-06-02 DIAGNOSIS — Z4932 Encounter for adequacy testing for peritoneal dialysis: Secondary | ICD-10-CM | POA: Diagnosis not present

## 2018-06-02 DIAGNOSIS — N2581 Secondary hyperparathyroidism of renal origin: Secondary | ICD-10-CM | POA: Diagnosis not present

## 2018-06-02 DIAGNOSIS — D509 Iron deficiency anemia, unspecified: Secondary | ICD-10-CM | POA: Diagnosis not present

## 2018-06-03 DIAGNOSIS — N186 End stage renal disease: Secondary | ICD-10-CM | POA: Diagnosis not present

## 2018-06-03 DIAGNOSIS — E876 Hypokalemia: Secondary | ICD-10-CM | POA: Diagnosis not present

## 2018-06-03 DIAGNOSIS — D509 Iron deficiency anemia, unspecified: Secondary | ICD-10-CM | POA: Diagnosis not present

## 2018-06-03 DIAGNOSIS — D631 Anemia in chronic kidney disease: Secondary | ICD-10-CM | POA: Diagnosis not present

## 2018-06-03 DIAGNOSIS — N2581 Secondary hyperparathyroidism of renal origin: Secondary | ICD-10-CM | POA: Diagnosis not present

## 2018-06-03 DIAGNOSIS — Z4932 Encounter for adequacy testing for peritoneal dialysis: Secondary | ICD-10-CM | POA: Diagnosis not present

## 2018-06-04 DIAGNOSIS — N186 End stage renal disease: Secondary | ICD-10-CM | POA: Diagnosis not present

## 2018-06-04 DIAGNOSIS — Z4932 Encounter for adequacy testing for peritoneal dialysis: Secondary | ICD-10-CM | POA: Diagnosis not present

## 2018-06-04 DIAGNOSIS — N2581 Secondary hyperparathyroidism of renal origin: Secondary | ICD-10-CM | POA: Diagnosis not present

## 2018-06-04 DIAGNOSIS — D631 Anemia in chronic kidney disease: Secondary | ICD-10-CM | POA: Diagnosis not present

## 2018-06-04 DIAGNOSIS — E876 Hypokalemia: Secondary | ICD-10-CM | POA: Diagnosis not present

## 2018-06-04 DIAGNOSIS — D509 Iron deficiency anemia, unspecified: Secondary | ICD-10-CM | POA: Diagnosis not present

## 2018-06-05 DIAGNOSIS — N186 End stage renal disease: Secondary | ICD-10-CM | POA: Diagnosis not present

## 2018-06-05 DIAGNOSIS — M311 Thrombotic microangiopathy: Secondary | ICD-10-CM | POA: Diagnosis not present

## 2018-06-05 DIAGNOSIS — E440 Moderate protein-calorie malnutrition: Secondary | ICD-10-CM | POA: Diagnosis not present

## 2018-06-05 DIAGNOSIS — R17 Unspecified jaundice: Secondary | ICD-10-CM | POA: Diagnosis not present

## 2018-06-05 DIAGNOSIS — N2581 Secondary hyperparathyroidism of renal origin: Secondary | ICD-10-CM | POA: Diagnosis not present

## 2018-06-05 DIAGNOSIS — Z4932 Encounter for adequacy testing for peritoneal dialysis: Secondary | ICD-10-CM | POA: Diagnosis not present

## 2018-06-05 DIAGNOSIS — D509 Iron deficiency anemia, unspecified: Secondary | ICD-10-CM | POA: Diagnosis not present

## 2018-06-05 DIAGNOSIS — Z79899 Other long term (current) drug therapy: Secondary | ICD-10-CM | POA: Diagnosis not present

## 2018-06-05 DIAGNOSIS — D631 Anemia in chronic kidney disease: Secondary | ICD-10-CM | POA: Diagnosis not present

## 2018-06-05 DIAGNOSIS — Z992 Dependence on renal dialysis: Secondary | ICD-10-CM | POA: Diagnosis not present

## 2018-06-06 DIAGNOSIS — N2581 Secondary hyperparathyroidism of renal origin: Secondary | ICD-10-CM | POA: Diagnosis not present

## 2018-06-06 DIAGNOSIS — Z4932 Encounter for adequacy testing for peritoneal dialysis: Secondary | ICD-10-CM | POA: Diagnosis not present

## 2018-06-06 DIAGNOSIS — D631 Anemia in chronic kidney disease: Secondary | ICD-10-CM | POA: Diagnosis not present

## 2018-06-06 DIAGNOSIS — E440 Moderate protein-calorie malnutrition: Secondary | ICD-10-CM | POA: Diagnosis not present

## 2018-06-06 DIAGNOSIS — D509 Iron deficiency anemia, unspecified: Secondary | ICD-10-CM | POA: Diagnosis not present

## 2018-06-06 DIAGNOSIS — N186 End stage renal disease: Secondary | ICD-10-CM | POA: Diagnosis not present

## 2018-06-07 DIAGNOSIS — D631 Anemia in chronic kidney disease: Secondary | ICD-10-CM | POA: Diagnosis not present

## 2018-06-07 DIAGNOSIS — E440 Moderate protein-calorie malnutrition: Secondary | ICD-10-CM | POA: Diagnosis not present

## 2018-06-07 DIAGNOSIS — Z4932 Encounter for adequacy testing for peritoneal dialysis: Secondary | ICD-10-CM | POA: Diagnosis not present

## 2018-06-07 DIAGNOSIS — N2581 Secondary hyperparathyroidism of renal origin: Secondary | ICD-10-CM | POA: Diagnosis not present

## 2018-06-07 DIAGNOSIS — D509 Iron deficiency anemia, unspecified: Secondary | ICD-10-CM | POA: Diagnosis not present

## 2018-06-07 DIAGNOSIS — N186 End stage renal disease: Secondary | ICD-10-CM | POA: Diagnosis not present

## 2018-06-08 ENCOUNTER — Telehealth: Payer: Self-pay

## 2018-06-08 DIAGNOSIS — D509 Iron deficiency anemia, unspecified: Secondary | ICD-10-CM | POA: Diagnosis not present

## 2018-06-08 DIAGNOSIS — D631 Anemia in chronic kidney disease: Secondary | ICD-10-CM | POA: Diagnosis not present

## 2018-06-08 DIAGNOSIS — N186 End stage renal disease: Secondary | ICD-10-CM | POA: Diagnosis not present

## 2018-06-08 DIAGNOSIS — N2581 Secondary hyperparathyroidism of renal origin: Secondary | ICD-10-CM | POA: Diagnosis not present

## 2018-06-08 DIAGNOSIS — Z4932 Encounter for adequacy testing for peritoneal dialysis: Secondary | ICD-10-CM | POA: Diagnosis not present

## 2018-06-08 DIAGNOSIS — E440 Moderate protein-calorie malnutrition: Secondary | ICD-10-CM | POA: Diagnosis not present

## 2018-06-09 DIAGNOSIS — Z4932 Encounter for adequacy testing for peritoneal dialysis: Secondary | ICD-10-CM | POA: Diagnosis not present

## 2018-06-09 DIAGNOSIS — D631 Anemia in chronic kidney disease: Secondary | ICD-10-CM | POA: Diagnosis not present

## 2018-06-09 DIAGNOSIS — N2581 Secondary hyperparathyroidism of renal origin: Secondary | ICD-10-CM | POA: Diagnosis not present

## 2018-06-09 DIAGNOSIS — N186 End stage renal disease: Secondary | ICD-10-CM | POA: Diagnosis not present

## 2018-06-09 DIAGNOSIS — E440 Moderate protein-calorie malnutrition: Secondary | ICD-10-CM | POA: Diagnosis not present

## 2018-06-09 DIAGNOSIS — D509 Iron deficiency anemia, unspecified: Secondary | ICD-10-CM | POA: Diagnosis not present

## 2018-06-09 NOTE — Telephone Encounter (Signed)
Spoke with pt regarding appt on 06/12/18. Pt stated she does not have any concerns regarding MyChart video visit. Pt was advise to check vitals prior to appt.

## 2018-06-10 DIAGNOSIS — E440 Moderate protein-calorie malnutrition: Secondary | ICD-10-CM | POA: Diagnosis not present

## 2018-06-10 DIAGNOSIS — N186 End stage renal disease: Secondary | ICD-10-CM | POA: Diagnosis not present

## 2018-06-10 DIAGNOSIS — Z4932 Encounter for adequacy testing for peritoneal dialysis: Secondary | ICD-10-CM | POA: Diagnosis not present

## 2018-06-10 DIAGNOSIS — D509 Iron deficiency anemia, unspecified: Secondary | ICD-10-CM | POA: Diagnosis not present

## 2018-06-10 DIAGNOSIS — D631 Anemia in chronic kidney disease: Secondary | ICD-10-CM | POA: Diagnosis not present

## 2018-06-10 DIAGNOSIS — N2581 Secondary hyperparathyroidism of renal origin: Secondary | ICD-10-CM | POA: Diagnosis not present

## 2018-06-11 DIAGNOSIS — D631 Anemia in chronic kidney disease: Secondary | ICD-10-CM | POA: Diagnosis not present

## 2018-06-11 DIAGNOSIS — Z4932 Encounter for adequacy testing for peritoneal dialysis: Secondary | ICD-10-CM | POA: Diagnosis not present

## 2018-06-11 DIAGNOSIS — N186 End stage renal disease: Secondary | ICD-10-CM | POA: Diagnosis not present

## 2018-06-11 DIAGNOSIS — D509 Iron deficiency anemia, unspecified: Secondary | ICD-10-CM | POA: Diagnosis not present

## 2018-06-11 DIAGNOSIS — E440 Moderate protein-calorie malnutrition: Secondary | ICD-10-CM | POA: Diagnosis not present

## 2018-06-11 DIAGNOSIS — N2581 Secondary hyperparathyroidism of renal origin: Secondary | ICD-10-CM | POA: Diagnosis not present

## 2018-06-12 ENCOUNTER — Encounter: Payer: Self-pay | Admitting: Internal Medicine

## 2018-06-12 ENCOUNTER — Other Ambulatory Visit: Payer: Self-pay

## 2018-06-12 ENCOUNTER — Telehealth (INDEPENDENT_AMBULATORY_CARE_PROVIDER_SITE_OTHER): Payer: Medicare Other | Admitting: Internal Medicine

## 2018-06-12 VITALS — BP 121/79 | HR 84 | Ht 64.0 in | Wt 101.2 lb

## 2018-06-12 DIAGNOSIS — D631 Anemia in chronic kidney disease: Secondary | ICD-10-CM | POA: Diagnosis not present

## 2018-06-12 DIAGNOSIS — D509 Iron deficiency anemia, unspecified: Secondary | ICD-10-CM | POA: Diagnosis not present

## 2018-06-12 DIAGNOSIS — E440 Moderate protein-calorie malnutrition: Secondary | ICD-10-CM | POA: Diagnosis not present

## 2018-06-12 DIAGNOSIS — N186 End stage renal disease: Secondary | ICD-10-CM | POA: Diagnosis not present

## 2018-06-12 DIAGNOSIS — R82998 Other abnormal findings in urine: Secondary | ICD-10-CM | POA: Diagnosis not present

## 2018-06-12 DIAGNOSIS — Z4932 Encounter for adequacy testing for peritoneal dialysis: Secondary | ICD-10-CM | POA: Diagnosis not present

## 2018-06-12 DIAGNOSIS — I471 Supraventricular tachycardia: Secondary | ICD-10-CM | POA: Diagnosis not present

## 2018-06-12 DIAGNOSIS — N2581 Secondary hyperparathyroidism of renal origin: Secondary | ICD-10-CM | POA: Diagnosis not present

## 2018-06-12 NOTE — Progress Notes (Signed)
Electrophysiology TeleHealth Note   Due to national recommendations of social distancing due to COVID 19, an audio/video telehealth visit is felt to be most appropriate for this patient at this time.  See MyChart message from today for the patient's consent to telehealth for [facility].   Date:  06/12/2018   ID:  Erin Morgan, DOB 1955-05-24, MRN [id number]  Location: patient's home  Provider location: [address] [place]  Evaluation Performed: Follow-up visit  PCP:  Aretta Nip, MD  Cardiologist:  Mertie Moores, MD  Electrophysiologist:  Dr Rayann Heman  Chief Complaint:  tachycardia  History of Present Illness:    Erin Morgan is a 63 y.o. female who presents via audio/video conferencing for a telehealth visit today.  Since her hospital discharge, the patient reports doing very well.  Her heart rates are 60s-80s. She did have some HRs 110s initially after discharge.  She has had some issues with chronic anemia (hb 8.2). She is tired from this.  This past Friday, she had a brief episode of AMS.  She reports having trouble getting her words out.  This lasted for only 1-2 minutes.  She thinks she could have had a TIA.  She layed down and her symptoms resolved after a couple minutes.  She denies LOC.  Today, she denies symptoms of palpitations, chest pain, shortness of breath,  lower extremity edema, dizziness, presyncope, or syncope.  The patient is otherwise without complaint today.  The patient denies symptoms of fevers, chills, cough, or new SOB worrisome for COVID 19.  Past Medical History:  Diagnosis Date  . Anemia   . Constipation   . Demand ischemia ([place])    a. 05/2018 elevated troponin in the setting of atrial tachycardia.  Catheterization: Normal coronaries.  . Ectopic atrial tachycardia ([place])    a. 05/2018-managed with diltiazem.  Marland Kitchen ESRD (end stage renal disease) ([place])    dialysis - t/th/sa- [place]  . Family history of  adverse reaction to anesthesia    mom and sister has n/v  . GERD (gastroesophageal reflux disease)   . History of blood transfusion   . Hypertension   . Muscle weakness   . Osteopenia   . PONV (postoperative nausea and vomiting)    Headache  . Rheumatoid arthritis ([place])   . Seasonal allergies   . Thoracic aortic aneurysm ([place]) 07/18/2017   4.0 cm ascending thoracic noted on CT 07/18/2017  . Wears glasses     Past Surgical History:  Procedure Laterality Date  . AV FISTULA PLACEMENT Left 11/18/2014   Procedure: BRACHIOCEPHALIC ARTERIOVENOUS (AV) FISTULA CREATION;  Surgeon: Conrad Oasis, MD;  Location: [place];  Service: Vascular;  Laterality: Left;  . CARPAL TUNNEL RELEASE Right 04/05/2014   Procedure: RIGHT CARPAL TUNNEL RELEASE;  Surgeon: Daryll Brod, MD;  Location: [place];  Service: Orthopedics;  Laterality: Right;  ANESTHESIA:  IV REGIONAL FAB  . COLONOSCOPY    . LEFT HEART CATH AND CORONARY ANGIOGRAPHY N/A 05/26/2018   Procedure: LEFT HEART CATH AND CORONARY ANGIOGRAPHY;  Surgeon: Martinique, Peter M, MD;  Location: [place] CV LAB;  Service: Cardiovascular;  Laterality: N/A;  . REVISON OF ARTERIOVENOUS FISTULA Left 10/01/2016   Procedure: REVISON OF LEFT ARM ARTERIOVENOUS FISTULA;  Surgeon: Angelia Mould, MD;  Location: [place];  Service: Vascular;  Laterality: Left;  . REVISON OF ARTERIOVENOUS FISTULA Left 76/16/0737   Procedure: PLICATION OF LEFT ARM  ARTERIOVENOUS FISTULA;  Surgeon:  Angelia Mould, MD;  Location: [facility] OR;  Service: Vascular;  Laterality: Left;  . TONSILLECTOMY    . TUMOR EXCISION  2000   right foot    Current Outpatient Medications  Medication Sig Dispense Refill  . acetaminophen (TYLENOL) 500 MG tablet Take 1,000 mg by mouth every 8 (eight) hours as needed for headache.    . calcitRIOL (ROCALTROL) 0.5 MCG capsule Take 1 mcg by mouth daily.    Marland Kitchen diltiazem (CARDIZEM CD) 180 MG 24 hr capsule Take 1 capsule (180 mg total) by mouth  daily. 30 capsule 6  . ferric citrate (AURYXIA) 1 GM 210 MG(Fe) tablet Take 420 mg by mouth See admin instructions. Take 2 tablets (420 mg) by mouth with each meal & 2 tablet (210 mg) by mouth with each protein snack    . fluocinonide (LIDEX) 0.05 % external solution Apply 1 application topically at bedtime. MIX WITH CETAPHIL    . gentamicin cream (GARAMYCIN) 0.1 % Apply 1 application topically daily. For dialysis catheter    . multivitamin (RENA-VIT) TABS tablet Take 1 tablet by mouth at bedtime. 30 tablet 3  . omeprazole (PRILOSEC) 20 MG capsule Take 20 mg by mouth 2 (two) times daily.     Marland Kitchen ULTOMIRIS 300 MG/30ML SOLN injection Inject 30 mLs into the vein every 8 (eight) weeks.     No current facility-administered medications for this visit.     Allergies:   Patient has no known allergies.   Social History:  The patient  reports that she has never smoked. She has never used smokeless tobacco. She reports that she does not drink alcohol or use drugs.   Family History:  The patient's  family history includes CAD in her father; Cancer in her mother; Cervical cancer in her mother; Healthy in her brother and sister; Hyperlipidemia in her father and mother; Hypertension in her father and mother; Rheum arthritis in an other family member; Stroke in her father; Valvular heart disease in her father.   ROS:  Please see the history of present illness.   All other systems are personally reviewed and negative.    Exam:    Vital Signs:  BP 121/79   Pulse 84   Ht 5\' 4"  (1.626 m)   Wt 101 lb 3.2 oz (45.9 kg)   BMI 17.37 kg/m   Well appearing, alert and conversant, regular work of breathing,  good skin color Eyes- anicteric, neuro- grossly intact, skin- no apparent rash or lesions or cyanosis, mouth- oral mucosa is pink   Labs/Other Tests and Data Reviewed:    Recent Labs: 05/26/2018: ALT 26; B Natriuretic Peptide >4,500.0 05/27/2018: Hemoglobin 10.6; Platelets 227 05/28/2018: BUN 54; Creatinine,  Ser 8.72; Magnesium 1.8; Potassium 4.0; Sodium 130   Wt Readings from Last 3 Encounters:  06/12/18 101 lb 3.2 oz (45.9 kg)  05/28/18 107 lb 9.4 oz (48.8 kg)  12/14/17 108 lb (49 kg)     Other studies personally reviewed: Additional studies/ records that were reviewed today include: recent hospital records  Review of the above records today demonstrates: as above    ASSESSMENT & PLAN:    1.  Ectopic atrial tachycardia Clinically improved Continue diltiazem  2. ? TIA Given anemia and renal failure, I am reluctant to start [facility] currently. We also discussed ASA.  She would like to avoid this.  If any further events, will refer to neurology.  3. Anemia/ ESRD Followed by neprology  4. COVID 19 screen The patient denies symptoms of  COVID 19 at this time.  The importance of social distancing was discussed today.  Follow-up:  Me in office for an ekg and further evaluation in 3 weeks  Current medicines are reviewed at length with the patient today.   The patient does not have concerns regarding her medicines.  The following changes were made today:  none  Labs/ tests ordered today include:  No orders of the defined types were placed in this encounter.   Patient Risk:  after full review of this patients clinical status, I feel that they are at moderate risk at this time.  Today, I have spent 15 minutes with the patient with telehealth technology discussing atach .    Army Fossa, MD  06/12/2018 11:56 AM     [facility] [address] [place] [address] [phone] (office) [phone] (fax)

## 2018-06-13 DIAGNOSIS — E440 Moderate protein-calorie malnutrition: Secondary | ICD-10-CM | POA: Diagnosis not present

## 2018-06-13 DIAGNOSIS — N2581 Secondary hyperparathyroidism of renal origin: Secondary | ICD-10-CM | POA: Diagnosis not present

## 2018-06-13 DIAGNOSIS — N186 End stage renal disease: Secondary | ICD-10-CM | POA: Diagnosis not present

## 2018-06-13 DIAGNOSIS — Z4932 Encounter for adequacy testing for peritoneal dialysis: Secondary | ICD-10-CM | POA: Diagnosis not present

## 2018-06-13 DIAGNOSIS — D509 Iron deficiency anemia, unspecified: Secondary | ICD-10-CM | POA: Diagnosis not present

## 2018-06-13 DIAGNOSIS — D631 Anemia in chronic kidney disease: Secondary | ICD-10-CM | POA: Diagnosis not present

## 2018-06-14 DIAGNOSIS — D509 Iron deficiency anemia, unspecified: Secondary | ICD-10-CM | POA: Diagnosis not present

## 2018-06-14 DIAGNOSIS — Z4932 Encounter for adequacy testing for peritoneal dialysis: Secondary | ICD-10-CM | POA: Diagnosis not present

## 2018-06-14 DIAGNOSIS — E440 Moderate protein-calorie malnutrition: Secondary | ICD-10-CM | POA: Diagnosis not present

## 2018-06-14 DIAGNOSIS — D631 Anemia in chronic kidney disease: Secondary | ICD-10-CM | POA: Diagnosis not present

## 2018-06-14 DIAGNOSIS — N186 End stage renal disease: Secondary | ICD-10-CM | POA: Diagnosis not present

## 2018-06-14 DIAGNOSIS — N2581 Secondary hyperparathyroidism of renal origin: Secondary | ICD-10-CM | POA: Diagnosis not present

## 2018-06-15 DIAGNOSIS — D631 Anemia in chronic kidney disease: Secondary | ICD-10-CM | POA: Diagnosis not present

## 2018-06-15 DIAGNOSIS — E440 Moderate protein-calorie malnutrition: Secondary | ICD-10-CM | POA: Diagnosis not present

## 2018-06-15 DIAGNOSIS — D509 Iron deficiency anemia, unspecified: Secondary | ICD-10-CM | POA: Diagnosis not present

## 2018-06-15 DIAGNOSIS — Z4932 Encounter for adequacy testing for peritoneal dialysis: Secondary | ICD-10-CM | POA: Diagnosis not present

## 2018-06-15 DIAGNOSIS — N2581 Secondary hyperparathyroidism of renal origin: Secondary | ICD-10-CM | POA: Diagnosis not present

## 2018-06-15 DIAGNOSIS — N186 End stage renal disease: Secondary | ICD-10-CM | POA: Diagnosis not present

## 2018-06-16 DIAGNOSIS — D631 Anemia in chronic kidney disease: Secondary | ICD-10-CM | POA: Diagnosis not present

## 2018-06-16 DIAGNOSIS — E440 Moderate protein-calorie malnutrition: Secondary | ICD-10-CM | POA: Diagnosis not present

## 2018-06-16 DIAGNOSIS — N2581 Secondary hyperparathyroidism of renal origin: Secondary | ICD-10-CM | POA: Diagnosis not present

## 2018-06-16 DIAGNOSIS — D509 Iron deficiency anemia, unspecified: Secondary | ICD-10-CM | POA: Diagnosis not present

## 2018-06-16 DIAGNOSIS — N186 End stage renal disease: Secondary | ICD-10-CM | POA: Diagnosis not present

## 2018-06-16 DIAGNOSIS — Z4932 Encounter for adequacy testing for peritoneal dialysis: Secondary | ICD-10-CM | POA: Diagnosis not present

## 2018-06-17 DIAGNOSIS — Z4932 Encounter for adequacy testing for peritoneal dialysis: Secondary | ICD-10-CM | POA: Diagnosis not present

## 2018-06-17 DIAGNOSIS — N2581 Secondary hyperparathyroidism of renal origin: Secondary | ICD-10-CM | POA: Diagnosis not present

## 2018-06-17 DIAGNOSIS — N186 End stage renal disease: Secondary | ICD-10-CM | POA: Diagnosis not present

## 2018-06-17 DIAGNOSIS — D509 Iron deficiency anemia, unspecified: Secondary | ICD-10-CM | POA: Diagnosis not present

## 2018-06-17 DIAGNOSIS — D631 Anemia in chronic kidney disease: Secondary | ICD-10-CM | POA: Diagnosis not present

## 2018-06-17 DIAGNOSIS — E440 Moderate protein-calorie malnutrition: Secondary | ICD-10-CM | POA: Diagnosis not present

## 2018-06-18 DIAGNOSIS — N2581 Secondary hyperparathyroidism of renal origin: Secondary | ICD-10-CM | POA: Diagnosis not present

## 2018-06-18 DIAGNOSIS — E440 Moderate protein-calorie malnutrition: Secondary | ICD-10-CM | POA: Diagnosis not present

## 2018-06-18 DIAGNOSIS — N186 End stage renal disease: Secondary | ICD-10-CM | POA: Diagnosis not present

## 2018-06-18 DIAGNOSIS — D509 Iron deficiency anemia, unspecified: Secondary | ICD-10-CM | POA: Diagnosis not present

## 2018-06-18 DIAGNOSIS — D631 Anemia in chronic kidney disease: Secondary | ICD-10-CM | POA: Diagnosis not present

## 2018-06-18 DIAGNOSIS — Z4932 Encounter for adequacy testing for peritoneal dialysis: Secondary | ICD-10-CM | POA: Diagnosis not present

## 2018-06-19 DIAGNOSIS — N2581 Secondary hyperparathyroidism of renal origin: Secondary | ICD-10-CM | POA: Diagnosis not present

## 2018-06-19 DIAGNOSIS — Z4932 Encounter for adequacy testing for peritoneal dialysis: Secondary | ICD-10-CM | POA: Diagnosis not present

## 2018-06-19 DIAGNOSIS — D509 Iron deficiency anemia, unspecified: Secondary | ICD-10-CM | POA: Diagnosis not present

## 2018-06-19 DIAGNOSIS — D631 Anemia in chronic kidney disease: Secondary | ICD-10-CM | POA: Diagnosis not present

## 2018-06-19 DIAGNOSIS — E440 Moderate protein-calorie malnutrition: Secondary | ICD-10-CM | POA: Diagnosis not present

## 2018-06-19 DIAGNOSIS — N186 End stage renal disease: Secondary | ICD-10-CM | POA: Diagnosis not present

## 2018-06-20 DIAGNOSIS — E440 Moderate protein-calorie malnutrition: Secondary | ICD-10-CM | POA: Diagnosis not present

## 2018-06-20 DIAGNOSIS — D509 Iron deficiency anemia, unspecified: Secondary | ICD-10-CM | POA: Diagnosis not present

## 2018-06-20 DIAGNOSIS — N186 End stage renal disease: Secondary | ICD-10-CM | POA: Diagnosis not present

## 2018-06-20 DIAGNOSIS — D631 Anemia in chronic kidney disease: Secondary | ICD-10-CM | POA: Diagnosis not present

## 2018-06-20 DIAGNOSIS — Z4932 Encounter for adequacy testing for peritoneal dialysis: Secondary | ICD-10-CM | POA: Diagnosis not present

## 2018-06-20 DIAGNOSIS — N2581 Secondary hyperparathyroidism of renal origin: Secondary | ICD-10-CM | POA: Diagnosis not present

## 2018-06-21 DIAGNOSIS — E440 Moderate protein-calorie malnutrition: Secondary | ICD-10-CM | POA: Diagnosis not present

## 2018-06-21 DIAGNOSIS — Z4932 Encounter for adequacy testing for peritoneal dialysis: Secondary | ICD-10-CM | POA: Diagnosis not present

## 2018-06-21 DIAGNOSIS — D509 Iron deficiency anemia, unspecified: Secondary | ICD-10-CM | POA: Diagnosis not present

## 2018-06-21 DIAGNOSIS — N186 End stage renal disease: Secondary | ICD-10-CM | POA: Diagnosis not present

## 2018-06-21 DIAGNOSIS — N2581 Secondary hyperparathyroidism of renal origin: Secondary | ICD-10-CM | POA: Diagnosis not present

## 2018-06-21 DIAGNOSIS — D631 Anemia in chronic kidney disease: Secondary | ICD-10-CM | POA: Diagnosis not present

## 2018-06-22 DIAGNOSIS — Z4932 Encounter for adequacy testing for peritoneal dialysis: Secondary | ICD-10-CM | POA: Diagnosis not present

## 2018-06-22 DIAGNOSIS — N2581 Secondary hyperparathyroidism of renal origin: Secondary | ICD-10-CM | POA: Diagnosis not present

## 2018-06-22 DIAGNOSIS — D509 Iron deficiency anemia, unspecified: Secondary | ICD-10-CM | POA: Diagnosis not present

## 2018-06-22 DIAGNOSIS — N186 End stage renal disease: Secondary | ICD-10-CM | POA: Diagnosis not present

## 2018-06-22 DIAGNOSIS — D631 Anemia in chronic kidney disease: Secondary | ICD-10-CM | POA: Diagnosis not present

## 2018-06-22 DIAGNOSIS — E440 Moderate protein-calorie malnutrition: Secondary | ICD-10-CM | POA: Diagnosis not present

## 2018-06-23 DIAGNOSIS — E440 Moderate protein-calorie malnutrition: Secondary | ICD-10-CM | POA: Diagnosis not present

## 2018-06-23 DIAGNOSIS — N186 End stage renal disease: Secondary | ICD-10-CM | POA: Diagnosis not present

## 2018-06-23 DIAGNOSIS — D631 Anemia in chronic kidney disease: Secondary | ICD-10-CM | POA: Diagnosis not present

## 2018-06-23 DIAGNOSIS — N2581 Secondary hyperparathyroidism of renal origin: Secondary | ICD-10-CM | POA: Diagnosis not present

## 2018-06-23 DIAGNOSIS — Z4932 Encounter for adequacy testing for peritoneal dialysis: Secondary | ICD-10-CM | POA: Diagnosis not present

## 2018-06-23 DIAGNOSIS — D509 Iron deficiency anemia, unspecified: Secondary | ICD-10-CM | POA: Diagnosis not present

## 2018-06-24 DIAGNOSIS — N186 End stage renal disease: Secondary | ICD-10-CM | POA: Diagnosis not present

## 2018-06-24 DIAGNOSIS — N2581 Secondary hyperparathyroidism of renal origin: Secondary | ICD-10-CM | POA: Diagnosis not present

## 2018-06-24 DIAGNOSIS — D509 Iron deficiency anemia, unspecified: Secondary | ICD-10-CM | POA: Diagnosis not present

## 2018-06-24 DIAGNOSIS — Z4932 Encounter for adequacy testing for peritoneal dialysis: Secondary | ICD-10-CM | POA: Diagnosis not present

## 2018-06-24 DIAGNOSIS — E440 Moderate protein-calorie malnutrition: Secondary | ICD-10-CM | POA: Diagnosis not present

## 2018-06-24 DIAGNOSIS — D631 Anemia in chronic kidney disease: Secondary | ICD-10-CM | POA: Diagnosis not present

## 2018-06-25 DIAGNOSIS — N2581 Secondary hyperparathyroidism of renal origin: Secondary | ICD-10-CM | POA: Diagnosis not present

## 2018-06-25 DIAGNOSIS — D509 Iron deficiency anemia, unspecified: Secondary | ICD-10-CM | POA: Diagnosis not present

## 2018-06-25 DIAGNOSIS — N186 End stage renal disease: Secondary | ICD-10-CM | POA: Diagnosis not present

## 2018-06-25 DIAGNOSIS — Z4932 Encounter for adequacy testing for peritoneal dialysis: Secondary | ICD-10-CM | POA: Diagnosis not present

## 2018-06-25 DIAGNOSIS — E440 Moderate protein-calorie malnutrition: Secondary | ICD-10-CM | POA: Diagnosis not present

## 2018-06-25 DIAGNOSIS — D631 Anemia in chronic kidney disease: Secondary | ICD-10-CM | POA: Diagnosis not present

## 2018-06-26 DIAGNOSIS — N186 End stage renal disease: Secondary | ICD-10-CM | POA: Diagnosis not present

## 2018-06-26 DIAGNOSIS — N2581 Secondary hyperparathyroidism of renal origin: Secondary | ICD-10-CM | POA: Diagnosis not present

## 2018-06-26 DIAGNOSIS — Z4932 Encounter for adequacy testing for peritoneal dialysis: Secondary | ICD-10-CM | POA: Diagnosis not present

## 2018-06-26 DIAGNOSIS — D509 Iron deficiency anemia, unspecified: Secondary | ICD-10-CM | POA: Diagnosis not present

## 2018-06-26 DIAGNOSIS — E440 Moderate protein-calorie malnutrition: Secondary | ICD-10-CM | POA: Diagnosis not present

## 2018-06-26 DIAGNOSIS — D631 Anemia in chronic kidney disease: Secondary | ICD-10-CM | POA: Diagnosis not present

## 2018-06-27 DIAGNOSIS — Z4932 Encounter for adequacy testing for peritoneal dialysis: Secondary | ICD-10-CM | POA: Diagnosis not present

## 2018-06-27 DIAGNOSIS — D509 Iron deficiency anemia, unspecified: Secondary | ICD-10-CM | POA: Diagnosis not present

## 2018-06-27 DIAGNOSIS — E440 Moderate protein-calorie malnutrition: Secondary | ICD-10-CM | POA: Diagnosis not present

## 2018-06-27 DIAGNOSIS — N2581 Secondary hyperparathyroidism of renal origin: Secondary | ICD-10-CM | POA: Diagnosis not present

## 2018-06-27 DIAGNOSIS — N186 End stage renal disease: Secondary | ICD-10-CM | POA: Diagnosis not present

## 2018-06-27 DIAGNOSIS — D631 Anemia in chronic kidney disease: Secondary | ICD-10-CM | POA: Diagnosis not present

## 2018-06-28 DIAGNOSIS — D631 Anemia in chronic kidney disease: Secondary | ICD-10-CM | POA: Diagnosis not present

## 2018-06-28 DIAGNOSIS — Z4932 Encounter for adequacy testing for peritoneal dialysis: Secondary | ICD-10-CM | POA: Diagnosis not present

## 2018-06-28 DIAGNOSIS — N2581 Secondary hyperparathyroidism of renal origin: Secondary | ICD-10-CM | POA: Diagnosis not present

## 2018-06-28 DIAGNOSIS — D509 Iron deficiency anemia, unspecified: Secondary | ICD-10-CM | POA: Diagnosis not present

## 2018-06-28 DIAGNOSIS — E440 Moderate protein-calorie malnutrition: Secondary | ICD-10-CM | POA: Diagnosis not present

## 2018-06-28 DIAGNOSIS — N186 End stage renal disease: Secondary | ICD-10-CM | POA: Diagnosis not present

## 2018-06-29 DIAGNOSIS — N2581 Secondary hyperparathyroidism of renal origin: Secondary | ICD-10-CM | POA: Diagnosis not present

## 2018-06-29 DIAGNOSIS — E440 Moderate protein-calorie malnutrition: Secondary | ICD-10-CM | POA: Diagnosis not present

## 2018-06-29 DIAGNOSIS — Z4932 Encounter for adequacy testing for peritoneal dialysis: Secondary | ICD-10-CM | POA: Diagnosis not present

## 2018-06-29 DIAGNOSIS — D631 Anemia in chronic kidney disease: Secondary | ICD-10-CM | POA: Diagnosis not present

## 2018-06-29 DIAGNOSIS — N186 End stage renal disease: Secondary | ICD-10-CM | POA: Diagnosis not present

## 2018-06-29 DIAGNOSIS — D509 Iron deficiency anemia, unspecified: Secondary | ICD-10-CM | POA: Diagnosis not present

## 2018-06-30 DIAGNOSIS — D509 Iron deficiency anemia, unspecified: Secondary | ICD-10-CM | POA: Diagnosis not present

## 2018-06-30 DIAGNOSIS — N186 End stage renal disease: Secondary | ICD-10-CM | POA: Diagnosis not present

## 2018-06-30 DIAGNOSIS — D631 Anemia in chronic kidney disease: Secondary | ICD-10-CM | POA: Diagnosis not present

## 2018-06-30 DIAGNOSIS — Z4932 Encounter for adequacy testing for peritoneal dialysis: Secondary | ICD-10-CM | POA: Diagnosis not present

## 2018-06-30 DIAGNOSIS — E440 Moderate protein-calorie malnutrition: Secondary | ICD-10-CM | POA: Diagnosis not present

## 2018-06-30 DIAGNOSIS — N2581 Secondary hyperparathyroidism of renal origin: Secondary | ICD-10-CM | POA: Diagnosis not present

## 2018-07-01 DIAGNOSIS — Z4932 Encounter for adequacy testing for peritoneal dialysis: Secondary | ICD-10-CM | POA: Diagnosis not present

## 2018-07-01 DIAGNOSIS — N2581 Secondary hyperparathyroidism of renal origin: Secondary | ICD-10-CM | POA: Diagnosis not present

## 2018-07-01 DIAGNOSIS — N186 End stage renal disease: Secondary | ICD-10-CM | POA: Diagnosis not present

## 2018-07-01 DIAGNOSIS — D631 Anemia in chronic kidney disease: Secondary | ICD-10-CM | POA: Diagnosis not present

## 2018-07-01 DIAGNOSIS — E440 Moderate protein-calorie malnutrition: Secondary | ICD-10-CM | POA: Diagnosis not present

## 2018-07-01 DIAGNOSIS — D509 Iron deficiency anemia, unspecified: Secondary | ICD-10-CM | POA: Diagnosis not present

## 2018-07-02 DIAGNOSIS — D631 Anemia in chronic kidney disease: Secondary | ICD-10-CM | POA: Diagnosis not present

## 2018-07-02 DIAGNOSIS — Z4932 Encounter for adequacy testing for peritoneal dialysis: Secondary | ICD-10-CM | POA: Diagnosis not present

## 2018-07-02 DIAGNOSIS — N186 End stage renal disease: Secondary | ICD-10-CM | POA: Diagnosis not present

## 2018-07-02 DIAGNOSIS — N2581 Secondary hyperparathyroidism of renal origin: Secondary | ICD-10-CM | POA: Diagnosis not present

## 2018-07-02 DIAGNOSIS — E440 Moderate protein-calorie malnutrition: Secondary | ICD-10-CM | POA: Diagnosis not present

## 2018-07-02 DIAGNOSIS — D509 Iron deficiency anemia, unspecified: Secondary | ICD-10-CM | POA: Diagnosis not present

## 2018-07-03 DIAGNOSIS — Z4932 Encounter for adequacy testing for peritoneal dialysis: Secondary | ICD-10-CM | POA: Diagnosis not present

## 2018-07-03 DIAGNOSIS — D631 Anemia in chronic kidney disease: Secondary | ICD-10-CM | POA: Diagnosis not present

## 2018-07-03 DIAGNOSIS — E440 Moderate protein-calorie malnutrition: Secondary | ICD-10-CM | POA: Diagnosis not present

## 2018-07-03 DIAGNOSIS — D509 Iron deficiency anemia, unspecified: Secondary | ICD-10-CM | POA: Diagnosis not present

## 2018-07-03 DIAGNOSIS — N2581 Secondary hyperparathyroidism of renal origin: Secondary | ICD-10-CM | POA: Diagnosis not present

## 2018-07-03 DIAGNOSIS — N186 End stage renal disease: Secondary | ICD-10-CM | POA: Diagnosis not present

## 2018-07-04 ENCOUNTER — Telehealth: Payer: Self-pay

## 2018-07-04 DIAGNOSIS — Z4932 Encounter for adequacy testing for peritoneal dialysis: Secondary | ICD-10-CM | POA: Diagnosis not present

## 2018-07-04 DIAGNOSIS — N186 End stage renal disease: Secondary | ICD-10-CM | POA: Diagnosis not present

## 2018-07-04 DIAGNOSIS — E440 Moderate protein-calorie malnutrition: Secondary | ICD-10-CM | POA: Diagnosis not present

## 2018-07-04 DIAGNOSIS — N2581 Secondary hyperparathyroidism of renal origin: Secondary | ICD-10-CM | POA: Diagnosis not present

## 2018-07-04 DIAGNOSIS — D509 Iron deficiency anemia, unspecified: Secondary | ICD-10-CM | POA: Diagnosis not present

## 2018-07-04 DIAGNOSIS — D631 Anemia in chronic kidney disease: Secondary | ICD-10-CM | POA: Diagnosis not present

## 2018-07-04 NOTE — Telephone Encounter (Signed)
Left a message regarding covid-19 screening prior to appt.

## 2018-07-05 ENCOUNTER — Other Ambulatory Visit: Payer: Self-pay

## 2018-07-05 ENCOUNTER — Ambulatory Visit (INDEPENDENT_AMBULATORY_CARE_PROVIDER_SITE_OTHER): Payer: BC Managed Care – PPO | Admitting: Internal Medicine

## 2018-07-05 ENCOUNTER — Encounter: Payer: Self-pay | Admitting: Internal Medicine

## 2018-07-05 VITALS — BP 144/86 | HR 90 | Ht 64.0 in | Wt 96.2 lb

## 2018-07-05 DIAGNOSIS — N186 End stage renal disease: Secondary | ICD-10-CM | POA: Diagnosis not present

## 2018-07-05 DIAGNOSIS — G459 Transient cerebral ischemic attack, unspecified: Secondary | ICD-10-CM | POA: Diagnosis not present

## 2018-07-05 DIAGNOSIS — I10 Essential (primary) hypertension: Secondary | ICD-10-CM | POA: Diagnosis not present

## 2018-07-05 DIAGNOSIS — D509 Iron deficiency anemia, unspecified: Secondary | ICD-10-CM | POA: Diagnosis not present

## 2018-07-05 DIAGNOSIS — Z4932 Encounter for adequacy testing for peritoneal dialysis: Secondary | ICD-10-CM | POA: Diagnosis not present

## 2018-07-05 DIAGNOSIS — N2581 Secondary hyperparathyroidism of renal origin: Secondary | ICD-10-CM | POA: Diagnosis not present

## 2018-07-05 DIAGNOSIS — I471 Supraventricular tachycardia: Secondary | ICD-10-CM | POA: Diagnosis not present

## 2018-07-05 DIAGNOSIS — N2589 Other disorders resulting from impaired renal tubular function: Secondary | ICD-10-CM | POA: Diagnosis not present

## 2018-07-05 DIAGNOSIS — D631 Anemia in chronic kidney disease: Secondary | ICD-10-CM | POA: Diagnosis not present

## 2018-07-05 DIAGNOSIS — M311 Thrombotic microangiopathy: Secondary | ICD-10-CM | POA: Diagnosis not present

## 2018-07-05 DIAGNOSIS — Z992 Dependence on renal dialysis: Secondary | ICD-10-CM | POA: Diagnosis not present

## 2018-07-05 NOTE — Patient Instructions (Addendum)
Medication Instructions:  Your physician recommends that you continue on your current medications as directed. Please refer to the Current Medication list given to you today.  Labwork: None ordered.  Testing/Procedures: None ordered.  Follow-Up: Your physician wants you to follow-up in: 3 months with Dr. Rayann Heman.      Any Other Special Instructions Will Be Listed Below (If Applicable).  If you need a refill on your cardiac medications before your next appointment, please call your pharmacy.   Referral to Dr. Jaynee Eagles with neurology

## 2018-07-05 NOTE — Progress Notes (Signed)
PCP: Aretta Nip, MD   Primary EP: Dr Merri Brunette is a 63 y.o. female who presents today for routine electrophysiology followup.  Since last being seen in our clinic, the patient reports doing very well.  No further TIA like symptoms.  No symptoms of heart racing. Today, she denies symptoms of chest pain, shortness of breath,  lower extremity edema, dizziness, presyncope, or syncope.  The patient is otherwise without complaint today.   Past Medical History:  Diagnosis Date  . Anemia   . Constipation   . Demand ischemia ([place])    a. 05/2018 elevated troponin in the setting of atrial tachycardia.  Catheterization: Normal coronaries.  . Ectopic atrial tachycardia ([place])    a. 05/2018-managed with diltiazem.  Marland Kitchen ESRD (end stage renal disease) ([place])    dialysis - t/th/sa- [place]  . Family history of adverse reaction to anesthesia    mom and sister has n/v  . GERD (gastroesophageal reflux disease)   . History of blood transfusion   . Hypertension   . Muscle weakness   . Osteopenia   . PONV (postoperative nausea and vomiting)    Headache  . Rheumatoid arthritis ([place])   . Seasonal allergies   . Thoracic aortic aneurysm ([place]) 07/18/2017   4.0 cm ascending thoracic noted on CT 07/18/2017  . Wears glasses    Past Surgical History:  Procedure Laterality Date  . AV FISTULA PLACEMENT Left 11/18/2014   Procedure: BRACHIOCEPHALIC ARTERIOVENOUS (AV) FISTULA CREATION;  Surgeon: Conrad Wheeler, MD;  Location: [place];  Service: Vascular;  Laterality: Left;  . CARPAL TUNNEL RELEASE Right 04/05/2014   Procedure: RIGHT CARPAL TUNNEL RELEASE;  Surgeon: Daryll Brod, MD;  Location: [place];  Service: Orthopedics;  Laterality: Right;  ANESTHESIA:  IV REGIONAL FAB  . COLONOSCOPY    . LEFT HEART CATH AND CORONARY ANGIOGRAPHY N/A 05/26/2018   Procedure: LEFT HEART CATH AND CORONARY ANGIOGRAPHY;  Surgeon: Martinique, Peter M, MD;  Location: [place] CV LAB;   Service: Cardiovascular;  Laterality: N/A;  . REVISON OF ARTERIOVENOUS FISTULA Left 10/01/2016   Procedure: REVISON OF LEFT ARM ARTERIOVENOUS FISTULA;  Surgeon: Angelia Mould, MD;  Location: [place];  Service: Vascular;  Laterality: Left;  . REVISON OF ARTERIOVENOUS FISTULA Left 69/45/0388   Procedure: PLICATION OF LEFT ARM  ARTERIOVENOUS FISTULA;  Surgeon: Angelia Mould, MD;  Location: [place];  Service: Vascular;  Laterality: Left;  . TONSILLECTOMY    . TUMOR EXCISION  2000   right foot    ROS- all systems are reviewed and negatives except as per HPI above  Current Outpatient Medications  Medication Sig Dispense Refill  . acetaminophen (TYLENOL) 500 MG tablet Take 1,000 mg by mouth every 8 (eight) hours as needed for headache.    . calcitRIOL (ROCALTROL) 0.5 MCG capsule Take 1 mcg by mouth daily.    Marland Kitchen diltiazem (CARDIZEM CD) 180 MG 24 hr capsule Take 1 capsule (180 mg total) by mouth daily. 30 capsule 6  . ferric citrate (AURYXIA) 1 GM 210 MG(Fe) tablet Take 420 mg by mouth See admin instructions. Take 2 tablets (420 mg) by mouth with each meal & 2 tablet (210 mg) by mouth with each protein snack    . fluocinonide (LIDEX) 0.05 % external solution Apply 1 application topically at bedtime. MIX WITH CETAPHIL    . gentamicin cream (GARAMYCIN) 0.1 % Apply 1 application topically daily. For dialysis catheter    .  multivitamin (RENA-VIT) TABS tablet Take 1 tablet by mouth at bedtime. 30 tablet 3  . omeprazole (PRILOSEC) 20 MG capsule Take 20 mg by mouth 2 (two) times daily.     Marland Kitchen ULTOMIRIS 300 MG/30ML SOLN injection Inject 30 mLs into the vein every 8 (eight) weeks.     No current facility-administered medications for this visit.     Physical Exam: Vitals:   07/05/18 0954  BP: (!) 144/86  Pulse: 90  SpO2: 98%  Weight: 96 lb 3.2 oz (43.6 kg)  Height: 5\' 4"  (1.626 m)    GEN- The patient is well appearing, alert and oriented x 3 today.   Head- normocephalic, atraumatic  Eyes-  Sclera clear, conjunctiva pink Ears- hearing intact Oropharynx- clear Lungs- Clear to ausculation bilaterally, normal work of breathing Heart- Regular rate and rhythm, no murmurs, rubs or gallops, PMI not laterally displaced GI- soft, NT, ND, + BS Extremities- no clubbing, cyanosis, or edema  Wt Readings from Last 3 Encounters:  07/05/18 96 lb 3.2 oz (43.6 kg)  06/12/18 101 lb 3.2 oz (45.9 kg)  05/28/18 107 lb 9.4 oz (48.8 kg)    EKG tracing ordered today is personally reviewed and shows sinus rhythm 90 bpm, PR 244 msec, QRS 128 msec, LVH, diffuse TWI inversions  Assessment and Plan:  1. Ectopic atrial tachycardia Improved Heart rates are stable Limited by AV conduction on increasing diltiazem  2.  HTN Elevated bp at home Previously on losartan 50mg  daily prior to recent hospitalization I think she could restart losartan 25mg  daily but would like for nephrology input first as she follows closely with them  3. ? TIA Resolved, though I would like for Dr Jaynee Eagles to assess.  She has not had AF documented.  I therefore have not started on [facility] therapy.  She would like to avoid ASA.  4. Anemia/ ESRD Followed by nephrology  Return in 3 months  Thompson Grayer MD, [facility] 07/05/2018 10:17 AM

## 2018-07-06 DIAGNOSIS — D631 Anemia in chronic kidney disease: Secondary | ICD-10-CM | POA: Diagnosis not present

## 2018-07-06 DIAGNOSIS — Z4932 Encounter for adequacy testing for peritoneal dialysis: Secondary | ICD-10-CM | POA: Diagnosis not present

## 2018-07-06 DIAGNOSIS — D509 Iron deficiency anemia, unspecified: Secondary | ICD-10-CM | POA: Diagnosis not present

## 2018-07-06 DIAGNOSIS — N186 End stage renal disease: Secondary | ICD-10-CM | POA: Diagnosis not present

## 2018-07-06 DIAGNOSIS — N2581 Secondary hyperparathyroidism of renal origin: Secondary | ICD-10-CM | POA: Diagnosis not present

## 2018-07-06 DIAGNOSIS — N2589 Other disorders resulting from impaired renal tubular function: Secondary | ICD-10-CM | POA: Diagnosis not present

## 2018-07-07 DIAGNOSIS — Z4932 Encounter for adequacy testing for peritoneal dialysis: Secondary | ICD-10-CM | POA: Diagnosis not present

## 2018-07-07 DIAGNOSIS — N186 End stage renal disease: Secondary | ICD-10-CM | POA: Diagnosis not present

## 2018-07-07 DIAGNOSIS — N2589 Other disorders resulting from impaired renal tubular function: Secondary | ICD-10-CM | POA: Diagnosis not present

## 2018-07-07 DIAGNOSIS — D631 Anemia in chronic kidney disease: Secondary | ICD-10-CM | POA: Diagnosis not present

## 2018-07-07 DIAGNOSIS — D509 Iron deficiency anemia, unspecified: Secondary | ICD-10-CM | POA: Diagnosis not present

## 2018-07-07 DIAGNOSIS — N2581 Secondary hyperparathyroidism of renal origin: Secondary | ICD-10-CM | POA: Diagnosis not present

## 2018-07-08 DIAGNOSIS — N186 End stage renal disease: Secondary | ICD-10-CM | POA: Diagnosis not present

## 2018-07-08 DIAGNOSIS — D509 Iron deficiency anemia, unspecified: Secondary | ICD-10-CM | POA: Diagnosis not present

## 2018-07-08 DIAGNOSIS — D631 Anemia in chronic kidney disease: Secondary | ICD-10-CM | POA: Diagnosis not present

## 2018-07-08 DIAGNOSIS — Z4932 Encounter for adequacy testing for peritoneal dialysis: Secondary | ICD-10-CM | POA: Diagnosis not present

## 2018-07-08 DIAGNOSIS — N2589 Other disorders resulting from impaired renal tubular function: Secondary | ICD-10-CM | POA: Diagnosis not present

## 2018-07-08 DIAGNOSIS — N2581 Secondary hyperparathyroidism of renal origin: Secondary | ICD-10-CM | POA: Diagnosis not present

## 2018-07-09 DIAGNOSIS — D631 Anemia in chronic kidney disease: Secondary | ICD-10-CM | POA: Diagnosis not present

## 2018-07-09 DIAGNOSIS — N2589 Other disorders resulting from impaired renal tubular function: Secondary | ICD-10-CM | POA: Diagnosis not present

## 2018-07-09 DIAGNOSIS — N2581 Secondary hyperparathyroidism of renal origin: Secondary | ICD-10-CM | POA: Diagnosis not present

## 2018-07-09 DIAGNOSIS — D509 Iron deficiency anemia, unspecified: Secondary | ICD-10-CM | POA: Diagnosis not present

## 2018-07-09 DIAGNOSIS — N186 End stage renal disease: Secondary | ICD-10-CM | POA: Diagnosis not present

## 2018-07-09 DIAGNOSIS — Z4932 Encounter for adequacy testing for peritoneal dialysis: Secondary | ICD-10-CM | POA: Diagnosis not present

## 2018-07-10 DIAGNOSIS — D509 Iron deficiency anemia, unspecified: Secondary | ICD-10-CM | POA: Diagnosis not present

## 2018-07-10 DIAGNOSIS — Z4932 Encounter for adequacy testing for peritoneal dialysis: Secondary | ICD-10-CM | POA: Diagnosis not present

## 2018-07-10 DIAGNOSIS — N2581 Secondary hyperparathyroidism of renal origin: Secondary | ICD-10-CM | POA: Diagnosis not present

## 2018-07-10 DIAGNOSIS — N186 End stage renal disease: Secondary | ICD-10-CM | POA: Diagnosis not present

## 2018-07-10 DIAGNOSIS — D631 Anemia in chronic kidney disease: Secondary | ICD-10-CM | POA: Diagnosis not present

## 2018-07-10 DIAGNOSIS — N2589 Other disorders resulting from impaired renal tubular function: Secondary | ICD-10-CM | POA: Diagnosis not present

## 2018-07-11 DIAGNOSIS — N2581 Secondary hyperparathyroidism of renal origin: Secondary | ICD-10-CM | POA: Diagnosis not present

## 2018-07-11 DIAGNOSIS — D509 Iron deficiency anemia, unspecified: Secondary | ICD-10-CM | POA: Diagnosis not present

## 2018-07-11 DIAGNOSIS — D631 Anemia in chronic kidney disease: Secondary | ICD-10-CM | POA: Diagnosis not present

## 2018-07-11 DIAGNOSIS — N186 End stage renal disease: Secondary | ICD-10-CM | POA: Diagnosis not present

## 2018-07-11 DIAGNOSIS — N2589 Other disorders resulting from impaired renal tubular function: Secondary | ICD-10-CM | POA: Diagnosis not present

## 2018-07-11 DIAGNOSIS — Z4932 Encounter for adequacy testing for peritoneal dialysis: Secondary | ICD-10-CM | POA: Diagnosis not present

## 2018-07-12 DIAGNOSIS — D631 Anemia in chronic kidney disease: Secondary | ICD-10-CM | POA: Diagnosis not present

## 2018-07-12 DIAGNOSIS — N2581 Secondary hyperparathyroidism of renal origin: Secondary | ICD-10-CM | POA: Diagnosis not present

## 2018-07-12 DIAGNOSIS — N2589 Other disorders resulting from impaired renal tubular function: Secondary | ICD-10-CM | POA: Diagnosis not present

## 2018-07-12 DIAGNOSIS — D509 Iron deficiency anemia, unspecified: Secondary | ICD-10-CM | POA: Diagnosis not present

## 2018-07-12 DIAGNOSIS — Z4932 Encounter for adequacy testing for peritoneal dialysis: Secondary | ICD-10-CM | POA: Diagnosis not present

## 2018-07-12 DIAGNOSIS — N186 End stage renal disease: Secondary | ICD-10-CM | POA: Diagnosis not present

## 2018-07-12 DIAGNOSIS — M064 Inflammatory polyarthropathy: Secondary | ICD-10-CM | POA: Diagnosis not present

## 2018-07-13 DIAGNOSIS — D631 Anemia in chronic kidney disease: Secondary | ICD-10-CM | POA: Diagnosis not present

## 2018-07-13 DIAGNOSIS — D509 Iron deficiency anemia, unspecified: Secondary | ICD-10-CM | POA: Diagnosis not present

## 2018-07-13 DIAGNOSIS — N2589 Other disorders resulting from impaired renal tubular function: Secondary | ICD-10-CM | POA: Diagnosis not present

## 2018-07-13 DIAGNOSIS — N186 End stage renal disease: Secondary | ICD-10-CM | POA: Diagnosis not present

## 2018-07-13 DIAGNOSIS — Z4932 Encounter for adequacy testing for peritoneal dialysis: Secondary | ICD-10-CM | POA: Diagnosis not present

## 2018-07-13 DIAGNOSIS — N2581 Secondary hyperparathyroidism of renal origin: Secondary | ICD-10-CM | POA: Diagnosis not present

## 2018-07-14 DIAGNOSIS — N186 End stage renal disease: Secondary | ICD-10-CM | POA: Diagnosis not present

## 2018-07-14 DIAGNOSIS — N2589 Other disorders resulting from impaired renal tubular function: Secondary | ICD-10-CM | POA: Diagnosis not present

## 2018-07-14 DIAGNOSIS — D631 Anemia in chronic kidney disease: Secondary | ICD-10-CM | POA: Diagnosis not present

## 2018-07-14 DIAGNOSIS — N2581 Secondary hyperparathyroidism of renal origin: Secondary | ICD-10-CM | POA: Diagnosis not present

## 2018-07-14 DIAGNOSIS — Z4932 Encounter for adequacy testing for peritoneal dialysis: Secondary | ICD-10-CM | POA: Diagnosis not present

## 2018-07-14 DIAGNOSIS — D509 Iron deficiency anemia, unspecified: Secondary | ICD-10-CM | POA: Diagnosis not present

## 2018-07-15 DIAGNOSIS — Z4932 Encounter for adequacy testing for peritoneal dialysis: Secondary | ICD-10-CM | POA: Diagnosis not present

## 2018-07-15 DIAGNOSIS — N2581 Secondary hyperparathyroidism of renal origin: Secondary | ICD-10-CM | POA: Diagnosis not present

## 2018-07-15 DIAGNOSIS — D631 Anemia in chronic kidney disease: Secondary | ICD-10-CM | POA: Diagnosis not present

## 2018-07-15 DIAGNOSIS — D509 Iron deficiency anemia, unspecified: Secondary | ICD-10-CM | POA: Diagnosis not present

## 2018-07-15 DIAGNOSIS — N2589 Other disorders resulting from impaired renal tubular function: Secondary | ICD-10-CM | POA: Diagnosis not present

## 2018-07-15 DIAGNOSIS — N186 End stage renal disease: Secondary | ICD-10-CM | POA: Diagnosis not present

## 2018-07-16 DIAGNOSIS — N2581 Secondary hyperparathyroidism of renal origin: Secondary | ICD-10-CM | POA: Diagnosis not present

## 2018-07-16 DIAGNOSIS — D631 Anemia in chronic kidney disease: Secondary | ICD-10-CM | POA: Diagnosis not present

## 2018-07-16 DIAGNOSIS — Z4932 Encounter for adequacy testing for peritoneal dialysis: Secondary | ICD-10-CM | POA: Diagnosis not present

## 2018-07-16 DIAGNOSIS — N2589 Other disorders resulting from impaired renal tubular function: Secondary | ICD-10-CM | POA: Diagnosis not present

## 2018-07-16 DIAGNOSIS — D509 Iron deficiency anemia, unspecified: Secondary | ICD-10-CM | POA: Diagnosis not present

## 2018-07-16 DIAGNOSIS — N186 End stage renal disease: Secondary | ICD-10-CM | POA: Diagnosis not present

## 2018-07-17 DIAGNOSIS — N2581 Secondary hyperparathyroidism of renal origin: Secondary | ICD-10-CM | POA: Diagnosis not present

## 2018-07-17 DIAGNOSIS — Z4932 Encounter for adequacy testing for peritoneal dialysis: Secondary | ICD-10-CM | POA: Diagnosis not present

## 2018-07-17 DIAGNOSIS — D631 Anemia in chronic kidney disease: Secondary | ICD-10-CM | POA: Diagnosis not present

## 2018-07-17 DIAGNOSIS — R82998 Other abnormal findings in urine: Secondary | ICD-10-CM | POA: Diagnosis not present

## 2018-07-17 DIAGNOSIS — N186 End stage renal disease: Secondary | ICD-10-CM | POA: Diagnosis not present

## 2018-07-17 DIAGNOSIS — D509 Iron deficiency anemia, unspecified: Secondary | ICD-10-CM | POA: Diagnosis not present

## 2018-07-17 DIAGNOSIS — N2589 Other disorders resulting from impaired renal tubular function: Secondary | ICD-10-CM | POA: Diagnosis not present

## 2018-07-18 DIAGNOSIS — N2589 Other disorders resulting from impaired renal tubular function: Secondary | ICD-10-CM | POA: Diagnosis not present

## 2018-07-18 DIAGNOSIS — D509 Iron deficiency anemia, unspecified: Secondary | ICD-10-CM | POA: Diagnosis not present

## 2018-07-18 DIAGNOSIS — N186 End stage renal disease: Secondary | ICD-10-CM | POA: Diagnosis not present

## 2018-07-18 DIAGNOSIS — Z4932 Encounter for adequacy testing for peritoneal dialysis: Secondary | ICD-10-CM | POA: Diagnosis not present

## 2018-07-18 DIAGNOSIS — D631 Anemia in chronic kidney disease: Secondary | ICD-10-CM | POA: Diagnosis not present

## 2018-07-18 DIAGNOSIS — N2581 Secondary hyperparathyroidism of renal origin: Secondary | ICD-10-CM | POA: Diagnosis not present

## 2018-07-19 DIAGNOSIS — D631 Anemia in chronic kidney disease: Secondary | ICD-10-CM | POA: Diagnosis not present

## 2018-07-19 DIAGNOSIS — N186 End stage renal disease: Secondary | ICD-10-CM | POA: Diagnosis not present

## 2018-07-19 DIAGNOSIS — Z4932 Encounter for adequacy testing for peritoneal dialysis: Secondary | ICD-10-CM | POA: Diagnosis not present

## 2018-07-19 DIAGNOSIS — N2581 Secondary hyperparathyroidism of renal origin: Secondary | ICD-10-CM | POA: Diagnosis not present

## 2018-07-19 DIAGNOSIS — N2589 Other disorders resulting from impaired renal tubular function: Secondary | ICD-10-CM | POA: Diagnosis not present

## 2018-07-19 DIAGNOSIS — D509 Iron deficiency anemia, unspecified: Secondary | ICD-10-CM | POA: Diagnosis not present

## 2018-07-20 DIAGNOSIS — D509 Iron deficiency anemia, unspecified: Secondary | ICD-10-CM | POA: Diagnosis not present

## 2018-07-20 DIAGNOSIS — D631 Anemia in chronic kidney disease: Secondary | ICD-10-CM | POA: Diagnosis not present

## 2018-07-20 DIAGNOSIS — N2589 Other disorders resulting from impaired renal tubular function: Secondary | ICD-10-CM | POA: Diagnosis not present

## 2018-07-20 DIAGNOSIS — Z4932 Encounter for adequacy testing for peritoneal dialysis: Secondary | ICD-10-CM | POA: Diagnosis not present

## 2018-07-20 DIAGNOSIS — N2581 Secondary hyperparathyroidism of renal origin: Secondary | ICD-10-CM | POA: Diagnosis not present

## 2018-07-20 DIAGNOSIS — N186 End stage renal disease: Secondary | ICD-10-CM | POA: Diagnosis not present

## 2018-07-21 DIAGNOSIS — Z4932 Encounter for adequacy testing for peritoneal dialysis: Secondary | ICD-10-CM | POA: Diagnosis not present

## 2018-07-21 DIAGNOSIS — D631 Anemia in chronic kidney disease: Secondary | ICD-10-CM | POA: Diagnosis not present

## 2018-07-21 DIAGNOSIS — D509 Iron deficiency anemia, unspecified: Secondary | ICD-10-CM | POA: Diagnosis not present

## 2018-07-21 DIAGNOSIS — N2589 Other disorders resulting from impaired renal tubular function: Secondary | ICD-10-CM | POA: Diagnosis not present

## 2018-07-21 DIAGNOSIS — N2581 Secondary hyperparathyroidism of renal origin: Secondary | ICD-10-CM | POA: Diagnosis not present

## 2018-07-21 DIAGNOSIS — N186 End stage renal disease: Secondary | ICD-10-CM | POA: Diagnosis not present

## 2018-07-22 DIAGNOSIS — Z4932 Encounter for adequacy testing for peritoneal dialysis: Secondary | ICD-10-CM | POA: Diagnosis not present

## 2018-07-22 DIAGNOSIS — D509 Iron deficiency anemia, unspecified: Secondary | ICD-10-CM | POA: Diagnosis not present

## 2018-07-22 DIAGNOSIS — N2589 Other disorders resulting from impaired renal tubular function: Secondary | ICD-10-CM | POA: Diagnosis not present

## 2018-07-22 DIAGNOSIS — N186 End stage renal disease: Secondary | ICD-10-CM | POA: Diagnosis not present

## 2018-07-22 DIAGNOSIS — D631 Anemia in chronic kidney disease: Secondary | ICD-10-CM | POA: Diagnosis not present

## 2018-07-22 DIAGNOSIS — N2581 Secondary hyperparathyroidism of renal origin: Secondary | ICD-10-CM | POA: Diagnosis not present

## 2018-07-23 DIAGNOSIS — N186 End stage renal disease: Secondary | ICD-10-CM | POA: Diagnosis not present

## 2018-07-23 DIAGNOSIS — N2589 Other disorders resulting from impaired renal tubular function: Secondary | ICD-10-CM | POA: Diagnosis not present

## 2018-07-23 DIAGNOSIS — D631 Anemia in chronic kidney disease: Secondary | ICD-10-CM | POA: Diagnosis not present

## 2018-07-23 DIAGNOSIS — D509 Iron deficiency anemia, unspecified: Secondary | ICD-10-CM | POA: Diagnosis not present

## 2018-07-23 DIAGNOSIS — N390 Urinary tract infection, site not specified: Secondary | ICD-10-CM | POA: Diagnosis not present

## 2018-07-23 DIAGNOSIS — Z4932 Encounter for adequacy testing for peritoneal dialysis: Secondary | ICD-10-CM | POA: Diagnosis not present

## 2018-07-23 DIAGNOSIS — N2581 Secondary hyperparathyroidism of renal origin: Secondary | ICD-10-CM | POA: Diagnosis not present

## 2018-07-24 DIAGNOSIS — N2581 Secondary hyperparathyroidism of renal origin: Secondary | ICD-10-CM | POA: Diagnosis not present

## 2018-07-24 DIAGNOSIS — D631 Anemia in chronic kidney disease: Secondary | ICD-10-CM | POA: Diagnosis not present

## 2018-07-24 DIAGNOSIS — N2589 Other disorders resulting from impaired renal tubular function: Secondary | ICD-10-CM | POA: Diagnosis not present

## 2018-07-24 DIAGNOSIS — N186 End stage renal disease: Secondary | ICD-10-CM | POA: Diagnosis not present

## 2018-07-24 DIAGNOSIS — D509 Iron deficiency anemia, unspecified: Secondary | ICD-10-CM | POA: Diagnosis not present

## 2018-07-24 DIAGNOSIS — Z4932 Encounter for adequacy testing for peritoneal dialysis: Secondary | ICD-10-CM | POA: Diagnosis not present

## 2018-07-25 DIAGNOSIS — N2589 Other disorders resulting from impaired renal tubular function: Secondary | ICD-10-CM | POA: Diagnosis not present

## 2018-07-25 DIAGNOSIS — D509 Iron deficiency anemia, unspecified: Secondary | ICD-10-CM | POA: Diagnosis not present

## 2018-07-25 DIAGNOSIS — N186 End stage renal disease: Secondary | ICD-10-CM | POA: Diagnosis not present

## 2018-07-25 DIAGNOSIS — Z4932 Encounter for adequacy testing for peritoneal dialysis: Secondary | ICD-10-CM | POA: Diagnosis not present

## 2018-07-25 DIAGNOSIS — N2581 Secondary hyperparathyroidism of renal origin: Secondary | ICD-10-CM | POA: Diagnosis not present

## 2018-07-25 DIAGNOSIS — D631 Anemia in chronic kidney disease: Secondary | ICD-10-CM | POA: Diagnosis not present

## 2018-07-26 DIAGNOSIS — D631 Anemia in chronic kidney disease: Secondary | ICD-10-CM | POA: Diagnosis not present

## 2018-07-26 DIAGNOSIS — Z4932 Encounter for adequacy testing for peritoneal dialysis: Secondary | ICD-10-CM | POA: Diagnosis not present

## 2018-07-26 DIAGNOSIS — D509 Iron deficiency anemia, unspecified: Secondary | ICD-10-CM | POA: Diagnosis not present

## 2018-07-26 DIAGNOSIS — N2589 Other disorders resulting from impaired renal tubular function: Secondary | ICD-10-CM | POA: Diagnosis not present

## 2018-07-26 DIAGNOSIS — N186 End stage renal disease: Secondary | ICD-10-CM | POA: Diagnosis not present

## 2018-07-26 DIAGNOSIS — N2581 Secondary hyperparathyroidism of renal origin: Secondary | ICD-10-CM | POA: Diagnosis not present

## 2018-07-27 DIAGNOSIS — N2581 Secondary hyperparathyroidism of renal origin: Secondary | ICD-10-CM | POA: Diagnosis not present

## 2018-07-27 DIAGNOSIS — N2589 Other disorders resulting from impaired renal tubular function: Secondary | ICD-10-CM | POA: Diagnosis not present

## 2018-07-27 DIAGNOSIS — N186 End stage renal disease: Secondary | ICD-10-CM | POA: Diagnosis not present

## 2018-07-27 DIAGNOSIS — D509 Iron deficiency anemia, unspecified: Secondary | ICD-10-CM | POA: Diagnosis not present

## 2018-07-27 DIAGNOSIS — Z4932 Encounter for adequacy testing for peritoneal dialysis: Secondary | ICD-10-CM | POA: Diagnosis not present

## 2018-07-27 DIAGNOSIS — D631 Anemia in chronic kidney disease: Secondary | ICD-10-CM | POA: Diagnosis not present

## 2018-07-28 DIAGNOSIS — D509 Iron deficiency anemia, unspecified: Secondary | ICD-10-CM | POA: Diagnosis not present

## 2018-07-28 DIAGNOSIS — N186 End stage renal disease: Secondary | ICD-10-CM | POA: Diagnosis not present

## 2018-07-28 DIAGNOSIS — D631 Anemia in chronic kidney disease: Secondary | ICD-10-CM | POA: Diagnosis not present

## 2018-07-28 DIAGNOSIS — Z4932 Encounter for adequacy testing for peritoneal dialysis: Secondary | ICD-10-CM | POA: Diagnosis not present

## 2018-07-28 DIAGNOSIS — N2589 Other disorders resulting from impaired renal tubular function: Secondary | ICD-10-CM | POA: Diagnosis not present

## 2018-07-28 DIAGNOSIS — N2581 Secondary hyperparathyroidism of renal origin: Secondary | ICD-10-CM | POA: Diagnosis not present

## 2018-07-29 DIAGNOSIS — N186 End stage renal disease: Secondary | ICD-10-CM | POA: Diagnosis not present

## 2018-07-29 DIAGNOSIS — D631 Anemia in chronic kidney disease: Secondary | ICD-10-CM | POA: Diagnosis not present

## 2018-07-29 DIAGNOSIS — Z4932 Encounter for adequacy testing for peritoneal dialysis: Secondary | ICD-10-CM | POA: Diagnosis not present

## 2018-07-29 DIAGNOSIS — N2581 Secondary hyperparathyroidism of renal origin: Secondary | ICD-10-CM | POA: Diagnosis not present

## 2018-07-29 DIAGNOSIS — D509 Iron deficiency anemia, unspecified: Secondary | ICD-10-CM | POA: Diagnosis not present

## 2018-07-29 DIAGNOSIS — N2589 Other disorders resulting from impaired renal tubular function: Secondary | ICD-10-CM | POA: Diagnosis not present

## 2018-07-30 DIAGNOSIS — D509 Iron deficiency anemia, unspecified: Secondary | ICD-10-CM | POA: Diagnosis not present

## 2018-07-30 DIAGNOSIS — D631 Anemia in chronic kidney disease: Secondary | ICD-10-CM | POA: Diagnosis not present

## 2018-07-30 DIAGNOSIS — N2581 Secondary hyperparathyroidism of renal origin: Secondary | ICD-10-CM | POA: Diagnosis not present

## 2018-07-30 DIAGNOSIS — Z4932 Encounter for adequacy testing for peritoneal dialysis: Secondary | ICD-10-CM | POA: Diagnosis not present

## 2018-07-30 DIAGNOSIS — N186 End stage renal disease: Secondary | ICD-10-CM | POA: Diagnosis not present

## 2018-07-30 DIAGNOSIS — N2589 Other disorders resulting from impaired renal tubular function: Secondary | ICD-10-CM | POA: Diagnosis not present

## 2018-07-31 DIAGNOSIS — N2589 Other disorders resulting from impaired renal tubular function: Secondary | ICD-10-CM | POA: Diagnosis not present

## 2018-07-31 DIAGNOSIS — D509 Iron deficiency anemia, unspecified: Secondary | ICD-10-CM | POA: Diagnosis not present

## 2018-07-31 DIAGNOSIS — N2581 Secondary hyperparathyroidism of renal origin: Secondary | ICD-10-CM | POA: Diagnosis not present

## 2018-07-31 DIAGNOSIS — N186 End stage renal disease: Secondary | ICD-10-CM | POA: Diagnosis not present

## 2018-07-31 DIAGNOSIS — Z4932 Encounter for adequacy testing for peritoneal dialysis: Secondary | ICD-10-CM | POA: Diagnosis not present

## 2018-07-31 DIAGNOSIS — D631 Anemia in chronic kidney disease: Secondary | ICD-10-CM | POA: Diagnosis not present

## 2018-08-01 DIAGNOSIS — Z4932 Encounter for adequacy testing for peritoneal dialysis: Secondary | ICD-10-CM | POA: Diagnosis not present

## 2018-08-01 DIAGNOSIS — D509 Iron deficiency anemia, unspecified: Secondary | ICD-10-CM | POA: Diagnosis not present

## 2018-08-01 DIAGNOSIS — N2589 Other disorders resulting from impaired renal tubular function: Secondary | ICD-10-CM | POA: Diagnosis not present

## 2018-08-01 DIAGNOSIS — N2581 Secondary hyperparathyroidism of renal origin: Secondary | ICD-10-CM | POA: Diagnosis not present

## 2018-08-01 DIAGNOSIS — D631 Anemia in chronic kidney disease: Secondary | ICD-10-CM | POA: Diagnosis not present

## 2018-08-01 DIAGNOSIS — N186 End stage renal disease: Secondary | ICD-10-CM | POA: Diagnosis not present

## 2018-08-02 DIAGNOSIS — Z4932 Encounter for adequacy testing for peritoneal dialysis: Secondary | ICD-10-CM | POA: Diagnosis not present

## 2018-08-02 DIAGNOSIS — D509 Iron deficiency anemia, unspecified: Secondary | ICD-10-CM | POA: Diagnosis not present

## 2018-08-02 DIAGNOSIS — N2581 Secondary hyperparathyroidism of renal origin: Secondary | ICD-10-CM | POA: Diagnosis not present

## 2018-08-02 DIAGNOSIS — N186 End stage renal disease: Secondary | ICD-10-CM | POA: Diagnosis not present

## 2018-08-02 DIAGNOSIS — N2589 Other disorders resulting from impaired renal tubular function: Secondary | ICD-10-CM | POA: Diagnosis not present

## 2018-08-02 DIAGNOSIS — D631 Anemia in chronic kidney disease: Secondary | ICD-10-CM | POA: Diagnosis not present

## 2018-08-03 DIAGNOSIS — N186 End stage renal disease: Secondary | ICD-10-CM | POA: Diagnosis not present

## 2018-08-03 DIAGNOSIS — N2589 Other disorders resulting from impaired renal tubular function: Secondary | ICD-10-CM | POA: Diagnosis not present

## 2018-08-03 DIAGNOSIS — D631 Anemia in chronic kidney disease: Secondary | ICD-10-CM | POA: Diagnosis not present

## 2018-08-03 DIAGNOSIS — D509 Iron deficiency anemia, unspecified: Secondary | ICD-10-CM | POA: Diagnosis not present

## 2018-08-03 DIAGNOSIS — Z4932 Encounter for adequacy testing for peritoneal dialysis: Secondary | ICD-10-CM | POA: Diagnosis not present

## 2018-08-03 DIAGNOSIS — N2581 Secondary hyperparathyroidism of renal origin: Secondary | ICD-10-CM | POA: Diagnosis not present

## 2018-08-04 DIAGNOSIS — N2581 Secondary hyperparathyroidism of renal origin: Secondary | ICD-10-CM | POA: Diagnosis not present

## 2018-08-04 DIAGNOSIS — N2589 Other disorders resulting from impaired renal tubular function: Secondary | ICD-10-CM | POA: Diagnosis not present

## 2018-08-04 DIAGNOSIS — D509 Iron deficiency anemia, unspecified: Secondary | ICD-10-CM | POA: Diagnosis not present

## 2018-08-04 DIAGNOSIS — D631 Anemia in chronic kidney disease: Secondary | ICD-10-CM | POA: Diagnosis not present

## 2018-08-04 DIAGNOSIS — N186 End stage renal disease: Secondary | ICD-10-CM | POA: Diagnosis not present

## 2018-08-04 DIAGNOSIS — Z4932 Encounter for adequacy testing for peritoneal dialysis: Secondary | ICD-10-CM | POA: Diagnosis not present

## 2018-08-05 DIAGNOSIS — E440 Moderate protein-calorie malnutrition: Secondary | ICD-10-CM | POA: Diagnosis not present

## 2018-08-05 DIAGNOSIS — D509 Iron deficiency anemia, unspecified: Secondary | ICD-10-CM | POA: Diagnosis not present

## 2018-08-05 DIAGNOSIS — R17 Unspecified jaundice: Secondary | ICD-10-CM | POA: Diagnosis not present

## 2018-08-05 DIAGNOSIS — M311 Thrombotic microangiopathy: Secondary | ICD-10-CM | POA: Diagnosis not present

## 2018-08-05 DIAGNOSIS — D631 Anemia in chronic kidney disease: Secondary | ICD-10-CM | POA: Diagnosis not present

## 2018-08-05 DIAGNOSIS — Z4932 Encounter for adequacy testing for peritoneal dialysis: Secondary | ICD-10-CM | POA: Diagnosis not present

## 2018-08-05 DIAGNOSIS — Z79899 Other long term (current) drug therapy: Secondary | ICD-10-CM | POA: Diagnosis not present

## 2018-08-05 DIAGNOSIS — N2581 Secondary hyperparathyroidism of renal origin: Secondary | ICD-10-CM | POA: Diagnosis not present

## 2018-08-05 DIAGNOSIS — Z992 Dependence on renal dialysis: Secondary | ICD-10-CM | POA: Diagnosis not present

## 2018-08-05 DIAGNOSIS — N186 End stage renal disease: Secondary | ICD-10-CM | POA: Diagnosis not present

## 2018-08-06 DIAGNOSIS — R17 Unspecified jaundice: Secondary | ICD-10-CM | POA: Diagnosis not present

## 2018-08-06 DIAGNOSIS — Z992 Dependence on renal dialysis: Secondary | ICD-10-CM | POA: Diagnosis not present

## 2018-08-06 DIAGNOSIS — D631 Anemia in chronic kidney disease: Secondary | ICD-10-CM | POA: Diagnosis not present

## 2018-08-06 DIAGNOSIS — N186 End stage renal disease: Secondary | ICD-10-CM | POA: Diagnosis not present

## 2018-08-06 DIAGNOSIS — Z4932 Encounter for adequacy testing for peritoneal dialysis: Secondary | ICD-10-CM | POA: Diagnosis not present

## 2018-08-06 DIAGNOSIS — Z79899 Other long term (current) drug therapy: Secondary | ICD-10-CM | POA: Diagnosis not present

## 2018-08-07 DIAGNOSIS — Z4932 Encounter for adequacy testing for peritoneal dialysis: Secondary | ICD-10-CM | POA: Diagnosis not present

## 2018-08-07 DIAGNOSIS — Z79899 Other long term (current) drug therapy: Secondary | ICD-10-CM | POA: Diagnosis not present

## 2018-08-07 DIAGNOSIS — N186 End stage renal disease: Secondary | ICD-10-CM | POA: Diagnosis not present

## 2018-08-07 DIAGNOSIS — D631 Anemia in chronic kidney disease: Secondary | ICD-10-CM | POA: Diagnosis not present

## 2018-08-07 DIAGNOSIS — R17 Unspecified jaundice: Secondary | ICD-10-CM | POA: Diagnosis not present

## 2018-08-07 DIAGNOSIS — Z992 Dependence on renal dialysis: Secondary | ICD-10-CM | POA: Diagnosis not present

## 2018-08-08 DIAGNOSIS — Z79899 Other long term (current) drug therapy: Secondary | ICD-10-CM | POA: Diagnosis not present

## 2018-08-08 DIAGNOSIS — D631 Anemia in chronic kidney disease: Secondary | ICD-10-CM | POA: Diagnosis not present

## 2018-08-08 DIAGNOSIS — Z992 Dependence on renal dialysis: Secondary | ICD-10-CM | POA: Diagnosis not present

## 2018-08-08 DIAGNOSIS — R82998 Other abnormal findings in urine: Secondary | ICD-10-CM | POA: Diagnosis not present

## 2018-08-08 DIAGNOSIS — R17 Unspecified jaundice: Secondary | ICD-10-CM | POA: Diagnosis not present

## 2018-08-08 DIAGNOSIS — Z4932 Encounter for adequacy testing for peritoneal dialysis: Secondary | ICD-10-CM | POA: Diagnosis not present

## 2018-08-08 DIAGNOSIS — N186 End stage renal disease: Secondary | ICD-10-CM | POA: Diagnosis not present

## 2018-08-09 DIAGNOSIS — D631 Anemia in chronic kidney disease: Secondary | ICD-10-CM | POA: Diagnosis not present

## 2018-08-09 DIAGNOSIS — Z4932 Encounter for adequacy testing for peritoneal dialysis: Secondary | ICD-10-CM | POA: Diagnosis not present

## 2018-08-09 DIAGNOSIS — Z79899 Other long term (current) drug therapy: Secondary | ICD-10-CM | POA: Diagnosis not present

## 2018-08-09 DIAGNOSIS — N186 End stage renal disease: Secondary | ICD-10-CM | POA: Diagnosis not present

## 2018-08-09 DIAGNOSIS — Z992 Dependence on renal dialysis: Secondary | ICD-10-CM | POA: Diagnosis not present

## 2018-08-09 DIAGNOSIS — R17 Unspecified jaundice: Secondary | ICD-10-CM | POA: Diagnosis not present

## 2018-08-10 DIAGNOSIS — Z992 Dependence on renal dialysis: Secondary | ICD-10-CM | POA: Diagnosis not present

## 2018-08-10 DIAGNOSIS — N186 End stage renal disease: Secondary | ICD-10-CM | POA: Diagnosis not present

## 2018-08-10 DIAGNOSIS — D631 Anemia in chronic kidney disease: Secondary | ICD-10-CM | POA: Diagnosis not present

## 2018-08-10 DIAGNOSIS — R17 Unspecified jaundice: Secondary | ICD-10-CM | POA: Diagnosis not present

## 2018-08-10 DIAGNOSIS — Z79899 Other long term (current) drug therapy: Secondary | ICD-10-CM | POA: Diagnosis not present

## 2018-08-10 DIAGNOSIS — Z4932 Encounter for adequacy testing for peritoneal dialysis: Secondary | ICD-10-CM | POA: Diagnosis not present

## 2018-08-11 DIAGNOSIS — Z992 Dependence on renal dialysis: Secondary | ICD-10-CM | POA: Diagnosis not present

## 2018-08-11 DIAGNOSIS — N186 End stage renal disease: Secondary | ICD-10-CM | POA: Diagnosis not present

## 2018-08-11 DIAGNOSIS — R17 Unspecified jaundice: Secondary | ICD-10-CM | POA: Diagnosis not present

## 2018-08-11 DIAGNOSIS — D631 Anemia in chronic kidney disease: Secondary | ICD-10-CM | POA: Diagnosis not present

## 2018-08-11 DIAGNOSIS — Z79899 Other long term (current) drug therapy: Secondary | ICD-10-CM | POA: Diagnosis not present

## 2018-08-11 DIAGNOSIS — Z4932 Encounter for adequacy testing for peritoneal dialysis: Secondary | ICD-10-CM | POA: Diagnosis not present

## 2018-08-12 DIAGNOSIS — Z992 Dependence on renal dialysis: Secondary | ICD-10-CM | POA: Diagnosis not present

## 2018-08-12 DIAGNOSIS — R17 Unspecified jaundice: Secondary | ICD-10-CM | POA: Diagnosis not present

## 2018-08-12 DIAGNOSIS — D631 Anemia in chronic kidney disease: Secondary | ICD-10-CM | POA: Diagnosis not present

## 2018-08-12 DIAGNOSIS — Z4932 Encounter for adequacy testing for peritoneal dialysis: Secondary | ICD-10-CM | POA: Diagnosis not present

## 2018-08-12 DIAGNOSIS — Z79899 Other long term (current) drug therapy: Secondary | ICD-10-CM | POA: Diagnosis not present

## 2018-08-12 DIAGNOSIS — N186 End stage renal disease: Secondary | ICD-10-CM | POA: Diagnosis not present

## 2018-08-13 DIAGNOSIS — Z992 Dependence on renal dialysis: Secondary | ICD-10-CM | POA: Diagnosis not present

## 2018-08-13 DIAGNOSIS — D631 Anemia in chronic kidney disease: Secondary | ICD-10-CM | POA: Diagnosis not present

## 2018-08-13 DIAGNOSIS — R17 Unspecified jaundice: Secondary | ICD-10-CM | POA: Diagnosis not present

## 2018-08-13 DIAGNOSIS — Z79899 Other long term (current) drug therapy: Secondary | ICD-10-CM | POA: Diagnosis not present

## 2018-08-13 DIAGNOSIS — Z4932 Encounter for adequacy testing for peritoneal dialysis: Secondary | ICD-10-CM | POA: Diagnosis not present

## 2018-08-13 DIAGNOSIS — N186 End stage renal disease: Secondary | ICD-10-CM | POA: Diagnosis not present

## 2018-08-14 DIAGNOSIS — R17 Unspecified jaundice: Secondary | ICD-10-CM | POA: Diagnosis not present

## 2018-08-14 DIAGNOSIS — Z4932 Encounter for adequacy testing for peritoneal dialysis: Secondary | ICD-10-CM | POA: Diagnosis not present

## 2018-08-14 DIAGNOSIS — D631 Anemia in chronic kidney disease: Secondary | ICD-10-CM | POA: Diagnosis not present

## 2018-08-14 DIAGNOSIS — Z79899 Other long term (current) drug therapy: Secondary | ICD-10-CM | POA: Diagnosis not present

## 2018-08-14 DIAGNOSIS — N186 End stage renal disease: Secondary | ICD-10-CM | POA: Diagnosis not present

## 2018-08-14 DIAGNOSIS — Z992 Dependence on renal dialysis: Secondary | ICD-10-CM | POA: Diagnosis not present

## 2018-08-15 ENCOUNTER — Other Ambulatory Visit: Payer: Self-pay

## 2018-08-15 ENCOUNTER — Encounter: Payer: Self-pay | Admitting: Neurology

## 2018-08-15 ENCOUNTER — Ambulatory Visit (INDEPENDENT_AMBULATORY_CARE_PROVIDER_SITE_OTHER): Payer: Medicare Other | Admitting: Neurology

## 2018-08-15 VITALS — BP 129/88 | HR 70 | Temp 97.6°F | Ht 64.0 in | Wt 103.5 lb

## 2018-08-15 DIAGNOSIS — G459 Transient cerebral ischemic attack, unspecified: Secondary | ICD-10-CM | POA: Diagnosis not present

## 2018-08-15 DIAGNOSIS — D631 Anemia in chronic kidney disease: Secondary | ICD-10-CM | POA: Diagnosis not present

## 2018-08-15 DIAGNOSIS — Z4932 Encounter for adequacy testing for peritoneal dialysis: Secondary | ICD-10-CM | POA: Diagnosis not present

## 2018-08-15 DIAGNOSIS — N186 End stage renal disease: Secondary | ICD-10-CM

## 2018-08-15 DIAGNOSIS — Z992 Dependence on renal dialysis: Secondary | ICD-10-CM | POA: Diagnosis not present

## 2018-08-15 DIAGNOSIS — Z79899 Other long term (current) drug therapy: Secondary | ICD-10-CM | POA: Diagnosis not present

## 2018-08-15 DIAGNOSIS — R17 Unspecified jaundice: Secondary | ICD-10-CM | POA: Diagnosis not present

## 2018-08-15 NOTE — Progress Notes (Signed)
PATIENT: Erin Morgan DOB: Oct 16, 1955  Chief Complaint  Patient presents with  . Transient Ischemic Attack    She is had a TIA event the first week of June.  While sitting at the dinner table, she experience word finding difficulty while talking with her mother.  She layed down on the sofa to rest and approximately five minutes later she was able to process normal thoughts again.  This was a one time occurrence.  Marland Kitchen PCP    Rankins, Bill Salinas, MD  . Cardiology    Thompson Grayer, MD (referring provider)     HISTORICAL  Erin Morgan is a 63 year old, seen in request by her primary care physician Dr. Radene Ou, [place] R, for evaluation of possible TIA, initial evaluation was on August 15, 2018.  I have reviewed and summarized the referring note from the referring physician.  She had a history of end-stage renal disease due to possible acute hemolytic uremic syndrome, was on hemodialysis for 3 years, on peritoneal dialysis, also has rheumatoid arthritis  In May 2020, she was noted to have tachycardia, was diagnosed with ectopic atrial tachycardia, no major changes in her medication, on diltiazem.  While she was sitting at home talking with her mother, she had sudden onset language difficulties, she knows what she wants to say but could not get the words out, she denies right arm weakness or sensory loss, symptoms last about 5 minutes, then quickly improved.  I personally reviewed MRI lumbar in January 2019, multi-level degenerative changes, no significant canal or foraminal narrowing next MRI of cervical spine, multilevel degenerative changes, spinal cord appears normal Echocardiogram on May 26, 2018, ejection fraction 50-55%, concentric left ventricular hypertrophy.  REVIEW OF SYSTEMS: Full 14 system review of systems performed and notable only for as above All other review of systems were negative.  ALLERGIES: No Known Allergies  HOME MEDICATIONS: Current Outpatient  Medications  Medication Sig Dispense Refill  . acetaminophen (TYLENOL) 500 MG tablet Take 1,000 mg by mouth every 8 (eight) hours as needed for headache.    . calcitRIOL (ROCALTROL) 0.5 MCG capsule Take 1 mcg by mouth daily.    Marland Kitchen diltiazem (CARDIZEM CD) 180 MG 24 hr capsule Take 1 capsule (180 mg total) by mouth daily. 30 capsule 6  . ferric citrate (AURYXIA) 1 GM 210 MG(Fe) tablet Take 420 mg by mouth See admin instructions. Take 2 tablets (420 mg) by mouth with each meal & 2 tablet (210 mg) by mouth with each protein snack    . fluocinonide (LIDEX) 0.05 % external solution Apply 1 application topically at bedtime. MIX WITH CETAPHIL    . gentamicin cream (GARAMYCIN) 0.1 % Apply 1 application topically daily. For dialysis catheter    . multivitamin (RENA-VIT) TABS tablet Take 1 tablet by mouth at bedtime. 30 tablet 3  . omeprazole (PRILOSEC) 20 MG capsule Take 20 mg by mouth 2 (two) times daily.     Marland Kitchen ULTOMIRIS 300 MG/30ML SOLN injection Inject 30 mLs into the vein every 8 (eight) weeks.     No current facility-administered medications for this visit.     PAST MEDICAL HISTORY: Past Medical History:  Diagnosis Date  . Anemia   . Constipation   . Demand ischemia ([place])    a. 05/2018 elevated troponin in the setting of atrial tachycardia.  Catheterization: Normal coronaries.  . Ectopic atrial tachycardia ([place])    a. 05/2018-managed with diltiazem.  Marland Kitchen ESRD (end stage renal disease) ([place])    dialysis -  t/th/sa- [facility]  . Family history of adverse reaction to anesthesia    mom and sister has n/v  . GERD (gastroesophageal reflux disease)   . History of blood transfusion   . Hypertension   . Muscle weakness   . Osteopenia   . PONV (postoperative nausea and vomiting)    Headache  . Rheumatoid arthritis ([place])   . Seasonal allergies   . Thoracic aortic aneurysm ([place]) 07/18/2017   4.0 cm ascending thoracic noted on CT 07/18/2017  . Wears glasses     PAST SURGICAL  HISTORY: Past Surgical History:  Procedure Laterality Date  . AV FISTULA PLACEMENT Left 11/18/2014   Procedure: BRACHIOCEPHALIC ARTERIOVENOUS (AV) FISTULA CREATION;  Surgeon: Conrad [place], MD;  Location: [facility];  Service: Vascular;  Laterality: Left;  . CARPAL TUNNEL RELEASE Right 04/05/2014   Procedure: RIGHT CARPAL TUNNEL RELEASE;  Surgeon: Daryll Brod, MD;  Location: [facility];  Service: Orthopedics;  Laterality: Right;  ANESTHESIA:  IV REGIONAL FAB  . COLONOSCOPY    . LEFT HEART CATH AND CORONARY ANGIOGRAPHY N/A 05/26/2018   Procedure: LEFT HEART CATH AND CORONARY ANGIOGRAPHY;  Surgeon: Martinique, Peter M, MD;  Location: [facility];  Service: Cardiovascular;  Laterality: N/A;  . REVISON OF ARTERIOVENOUS FISTULA Left 10/01/2016   Procedure: REVISON OF LEFT ARM ARTERIOVENOUS FISTULA;  Surgeon: Angelia Mould, MD;  Location: [facility];  Service: Vascular;  Laterality: Left;  . REVISON OF ARTERIOVENOUS FISTULA Left [id number]   Procedure: PLICATION OF LEFT ARM  ARTERIOVENOUS FISTULA;  Surgeon: Angelia Mould, MD;  Location: [facility];  Service: Vascular;  Laterality: Left;  . TONSILLECTOMY    . TUMOR EXCISION  2000   right foot    FAMILY HISTORY: Family History  Problem Relation Age of Onset  . Hypertension Mother   . Cervical cancer Mother   . Cancer Mother   . Hyperlipidemia Mother   . Hypertension Father   . Stroke Father   . Hyperlipidemia Father   . CAD Father        at age 28  . Valvular heart disease Father        TAVR at age 60  . Healthy Sister   . Healthy Brother   . Rheum arthritis Other     SOCIAL HISTORY: Social History   Socioeconomic History  . Marital status: Widowed    Spouse name: Not on file  . Number of children: 0  . Years of education: PhD  . Highest education level: Doctorate  Occupational History  . Not on file  Social Needs  . Financial resource strain: Not on file  . Food insecurity    Worry: Not on file     Inability: Not on file  . Transportation needs    Medical: Not on file    Non-medical: Not on file  Tobacco Use  . Smoking status: Never Smoker  . Smokeless tobacco: Never Used  Substance and Sexual Activity  . Alcohol use: No  . Drug use: No  . Sexual activity: Never  Lifestyle  . Physical activity    Days per week: Not on file    Minutes per session: Not on file  . Stress: Not on file  Relationships  . Social Herbalist on phone: Not on file    Gets together: Not on file    Attends religious service: Not on file    Active member of club or organization: Not on  file    Attends meetings of clubs or organizations: Not on file    Relationship status: Not on file  . Intimate partner violence    Fear of current or ex partner: Not on file    Emotionally abused: Not on file    Physically abused: Not on file    Forced sexual activity: Not on file  Other Topics Concern  . Not on file  Social History Narrative   Lives at home alone.   Right-handed.   Occasional caffeine use.     PHYSICAL EXAM   Vitals:   08/15/18 0839  BP: 129/88  Pulse: 70  Temp: 97.6 F (36.4 C)  Weight: 103 lb 8 oz (46.9 kg)  Height: 5\' 4"  (1.626 m)    Not recorded      Body mass index is 17.77 kg/m.  PHYSICAL EXAMNIATION:  Gen: NAD, conversant, well nourised, obese, well groomed                     Cardiovascular: Regular rate rhythm, no peripheral edema, warm, nontender. Eyes: Conjunctivae clear without exudates or hemorrhage Neck: Supple, no carotid bruits. Pulmonary: Clear to auscultation bilaterally   NEUROLOGICAL EXAM:  MENTAL STATUS: Speech:    Speech is normal; fluent and spontaneous with normal comprehension.  Cognition:     Orientation to time, place and person     Normal recent and remote memory     Normal Attention span and concentration     Normal Language, naming, repeating,spontaneous speech     Fund of knowledge   CRANIAL NERVES: CN II: Visual fields are  full to confrontation.  Pupils are round equal and briskly reactive to light. CN III, IV, VI: extraocular movement are normal. No ptosis. CN V: Facial sensation is intact to pinprick in all 3 divisions bilaterally. Corneal responses are intact.  CN VII: Face is symmetric with normal eye closure and smile. CN VIII: Hearing is normal to rubbing fingers CN IX, X: Palate elevates symmetrically. Phonation is normal. CN XI: Head turning and shoulder shrug are intact CN XII: Tongue is midline with normal movements and no atrophy.  MOTOR: There is no pronator drift of out-stretched arms. Muscle bulk and tone are normal. Muscle strength is normal.  REFLEXES: Reflexes are 2+ and symmetric at the biceps, triceps, knees, and ankles. Plantar responses are flexor.  SENSORY: Intact to light touch, pinprick, positional sensation and vibratory sensation are intact in fingers and toes.  COORDINATION: Rapid alternating movements and fine finger movements are intact. There is no dysmetria on finger-to-nose and heel-knee-shin.    GAIT/STANCE: She needs push-up to get up from seated position, steady  DIAGNOSTIC DATA (LABS, IMAGING, TESTING) - I reviewed patient records, labs, notes, testing and imaging myself where available.   ASSESSMENT AND PLAN  Erin Morgan is a 63 y.o. female   TIA in May 2020:  Complete evaluation with MRI of the brain  Start aspirin 81 mg daily   Marcial Pacas, M.D. Ph.D.  [facility] [address] [place], [place] [address] Ph: [phone] Fax: [phone]  CC: Rankins, Bill Salinas, MD

## 2018-08-16 ENCOUNTER — Telehealth: Payer: Self-pay | Admitting: Neurology

## 2018-08-16 DIAGNOSIS — Z992 Dependence on renal dialysis: Secondary | ICD-10-CM | POA: Diagnosis not present

## 2018-08-16 DIAGNOSIS — Z4932 Encounter for adequacy testing for peritoneal dialysis: Secondary | ICD-10-CM | POA: Diagnosis not present

## 2018-08-16 DIAGNOSIS — D631 Anemia in chronic kidney disease: Secondary | ICD-10-CM | POA: Diagnosis not present

## 2018-08-16 DIAGNOSIS — N186 End stage renal disease: Secondary | ICD-10-CM | POA: Diagnosis not present

## 2018-08-16 DIAGNOSIS — R17 Unspecified jaundice: Secondary | ICD-10-CM | POA: Diagnosis not present

## 2018-08-16 DIAGNOSIS — Z79899 Other long term (current) drug therapy: Secondary | ICD-10-CM | POA: Diagnosis not present

## 2018-08-16 NOTE — Telephone Encounter (Signed)
Medicare/BCBS Auth: [id number] (exp. 08/16/18 to 02/11/19) order sent to GI. They will reach out to the patient to schedule.

## 2018-08-17 DIAGNOSIS — Z79899 Other long term (current) drug therapy: Secondary | ICD-10-CM | POA: Diagnosis not present

## 2018-08-17 DIAGNOSIS — R17 Unspecified jaundice: Secondary | ICD-10-CM | POA: Diagnosis not present

## 2018-08-17 DIAGNOSIS — Z992 Dependence on renal dialysis: Secondary | ICD-10-CM | POA: Diagnosis not present

## 2018-08-17 DIAGNOSIS — Z4932 Encounter for adequacy testing for peritoneal dialysis: Secondary | ICD-10-CM | POA: Diagnosis not present

## 2018-08-17 DIAGNOSIS — N186 End stage renal disease: Secondary | ICD-10-CM | POA: Diagnosis not present

## 2018-08-17 DIAGNOSIS — D631 Anemia in chronic kidney disease: Secondary | ICD-10-CM | POA: Diagnosis not present

## 2018-08-18 DIAGNOSIS — D631 Anemia in chronic kidney disease: Secondary | ICD-10-CM | POA: Diagnosis not present

## 2018-08-18 DIAGNOSIS — Z4932 Encounter for adequacy testing for peritoneal dialysis: Secondary | ICD-10-CM | POA: Diagnosis not present

## 2018-08-18 DIAGNOSIS — R17 Unspecified jaundice: Secondary | ICD-10-CM | POA: Diagnosis not present

## 2018-08-18 DIAGNOSIS — N186 End stage renal disease: Secondary | ICD-10-CM | POA: Diagnosis not present

## 2018-08-18 DIAGNOSIS — Z79899 Other long term (current) drug therapy: Secondary | ICD-10-CM | POA: Diagnosis not present

## 2018-08-18 DIAGNOSIS — Z992 Dependence on renal dialysis: Secondary | ICD-10-CM | POA: Diagnosis not present

## 2018-08-19 DIAGNOSIS — Z4932 Encounter for adequacy testing for peritoneal dialysis: Secondary | ICD-10-CM | POA: Diagnosis not present

## 2018-08-19 DIAGNOSIS — Z992 Dependence on renal dialysis: Secondary | ICD-10-CM | POA: Diagnosis not present

## 2018-08-19 DIAGNOSIS — R17 Unspecified jaundice: Secondary | ICD-10-CM | POA: Diagnosis not present

## 2018-08-19 DIAGNOSIS — Z79899 Other long term (current) drug therapy: Secondary | ICD-10-CM | POA: Diagnosis not present

## 2018-08-19 DIAGNOSIS — D631 Anemia in chronic kidney disease: Secondary | ICD-10-CM | POA: Diagnosis not present

## 2018-08-19 DIAGNOSIS — N186 End stage renal disease: Secondary | ICD-10-CM | POA: Diagnosis not present

## 2018-08-20 DIAGNOSIS — Z79899 Other long term (current) drug therapy: Secondary | ICD-10-CM | POA: Diagnosis not present

## 2018-08-20 DIAGNOSIS — D631 Anemia in chronic kidney disease: Secondary | ICD-10-CM | POA: Diagnosis not present

## 2018-08-20 DIAGNOSIS — Z992 Dependence on renal dialysis: Secondary | ICD-10-CM | POA: Diagnosis not present

## 2018-08-20 DIAGNOSIS — Z4932 Encounter for adequacy testing for peritoneal dialysis: Secondary | ICD-10-CM | POA: Diagnosis not present

## 2018-08-20 DIAGNOSIS — N186 End stage renal disease: Secondary | ICD-10-CM | POA: Diagnosis not present

## 2018-08-20 DIAGNOSIS — R17 Unspecified jaundice: Secondary | ICD-10-CM | POA: Diagnosis not present

## 2018-08-21 DIAGNOSIS — D631 Anemia in chronic kidney disease: Secondary | ICD-10-CM | POA: Diagnosis not present

## 2018-08-21 DIAGNOSIS — Z4932 Encounter for adequacy testing for peritoneal dialysis: Secondary | ICD-10-CM | POA: Diagnosis not present

## 2018-08-21 DIAGNOSIS — Z992 Dependence on renal dialysis: Secondary | ICD-10-CM | POA: Diagnosis not present

## 2018-08-21 DIAGNOSIS — Z79899 Other long term (current) drug therapy: Secondary | ICD-10-CM | POA: Diagnosis not present

## 2018-08-21 DIAGNOSIS — R17 Unspecified jaundice: Secondary | ICD-10-CM | POA: Diagnosis not present

## 2018-08-21 DIAGNOSIS — N186 End stage renal disease: Secondary | ICD-10-CM | POA: Diagnosis not present

## 2018-08-22 DIAGNOSIS — Z79899 Other long term (current) drug therapy: Secondary | ICD-10-CM | POA: Diagnosis not present

## 2018-08-22 DIAGNOSIS — D631 Anemia in chronic kidney disease: Secondary | ICD-10-CM | POA: Diagnosis not present

## 2018-08-22 DIAGNOSIS — Z4932 Encounter for adequacy testing for peritoneal dialysis: Secondary | ICD-10-CM | POA: Diagnosis not present

## 2018-08-22 DIAGNOSIS — R17 Unspecified jaundice: Secondary | ICD-10-CM | POA: Diagnosis not present

## 2018-08-22 DIAGNOSIS — N186 End stage renal disease: Secondary | ICD-10-CM | POA: Diagnosis not present

## 2018-08-22 DIAGNOSIS — Z992 Dependence on renal dialysis: Secondary | ICD-10-CM | POA: Diagnosis not present

## 2018-08-23 ENCOUNTER — Encounter (HOSPITAL_COMMUNITY): Payer: BC Managed Care – PPO

## 2018-08-23 DIAGNOSIS — D631 Anemia in chronic kidney disease: Secondary | ICD-10-CM | POA: Diagnosis not present

## 2018-08-23 DIAGNOSIS — R17 Unspecified jaundice: Secondary | ICD-10-CM | POA: Diagnosis not present

## 2018-08-23 DIAGNOSIS — Z992 Dependence on renal dialysis: Secondary | ICD-10-CM | POA: Diagnosis not present

## 2018-08-23 DIAGNOSIS — N186 End stage renal disease: Secondary | ICD-10-CM | POA: Diagnosis not present

## 2018-08-23 DIAGNOSIS — Z79899 Other long term (current) drug therapy: Secondary | ICD-10-CM | POA: Diagnosis not present

## 2018-08-23 DIAGNOSIS — Z4932 Encounter for adequacy testing for peritoneal dialysis: Secondary | ICD-10-CM | POA: Diagnosis not present

## 2018-08-24 ENCOUNTER — Other Ambulatory Visit: Payer: Self-pay

## 2018-08-24 ENCOUNTER — Telehealth: Payer: Self-pay | Admitting: *Deleted

## 2018-08-24 ENCOUNTER — Ambulatory Visit (HOSPITAL_COMMUNITY)
Admission: RE | Admit: 2018-08-24 | Discharge: 2018-08-24 | Disposition: A | Discharge status: Home / Self Care | Payer: Medicare Other | Source: Ambulatory Visit | Attending: Neurology | Admitting: Neurology

## 2018-08-24 DIAGNOSIS — N186 End stage renal disease: Secondary | ICD-10-CM | POA: Diagnosis not present

## 2018-08-24 DIAGNOSIS — Z992 Dependence on renal dialysis: Secondary | ICD-10-CM | POA: Diagnosis not present

## 2018-08-24 DIAGNOSIS — G459 Transient cerebral ischemic attack, unspecified: Secondary | ICD-10-CM

## 2018-08-24 DIAGNOSIS — Z79899 Other long term (current) drug therapy: Secondary | ICD-10-CM | POA: Diagnosis not present

## 2018-08-24 DIAGNOSIS — D631 Anemia in chronic kidney disease: Secondary | ICD-10-CM | POA: Diagnosis not present

## 2018-08-24 DIAGNOSIS — R17 Unspecified jaundice: Secondary | ICD-10-CM | POA: Diagnosis not present

## 2018-08-24 DIAGNOSIS — Z4932 Encounter for adequacy testing for peritoneal dialysis: Secondary | ICD-10-CM | POA: Diagnosis not present

## 2018-08-24 NOTE — Telephone Encounter (Signed)
Left patient a detailed message, with results, on voicemail (ok per DPR).  Provided our number to call back with any questions.  

## 2018-08-24 NOTE — Progress Notes (Signed)
VASCULAR LAB PRELIMINARY  PRELIMINARY  PRELIMINARY  PRELIMINARY  Carotid duplex completed.    Preliminary report:  See CV proc for preliminary results.   Aralyn Nowak, RVT 08/24/2018, 4:11 PM

## 2018-08-24 NOTE — Telephone Encounter (Signed)
-----   Message from Marcial Pacas, MD sent at 08/24/2018  4:53 PM EDT ----- Please call patient: Ultrasound of carotid arteries showed no significant abnormality.

## 2018-08-25 DIAGNOSIS — Z992 Dependence on renal dialysis: Secondary | ICD-10-CM | POA: Diagnosis not present

## 2018-08-25 DIAGNOSIS — R17 Unspecified jaundice: Secondary | ICD-10-CM | POA: Diagnosis not present

## 2018-08-25 DIAGNOSIS — Z4932 Encounter for adequacy testing for peritoneal dialysis: Secondary | ICD-10-CM | POA: Diagnosis not present

## 2018-08-25 DIAGNOSIS — D631 Anemia in chronic kidney disease: Secondary | ICD-10-CM | POA: Diagnosis not present

## 2018-08-25 DIAGNOSIS — Z79899 Other long term (current) drug therapy: Secondary | ICD-10-CM | POA: Diagnosis not present

## 2018-08-25 DIAGNOSIS — N186 End stage renal disease: Secondary | ICD-10-CM | POA: Diagnosis not present

## 2018-08-26 DIAGNOSIS — Z992 Dependence on renal dialysis: Secondary | ICD-10-CM | POA: Diagnosis not present

## 2018-08-26 DIAGNOSIS — R17 Unspecified jaundice: Secondary | ICD-10-CM | POA: Diagnosis not present

## 2018-08-26 DIAGNOSIS — N186 End stage renal disease: Secondary | ICD-10-CM | POA: Diagnosis not present

## 2018-08-26 DIAGNOSIS — Z79899 Other long term (current) drug therapy: Secondary | ICD-10-CM | POA: Diagnosis not present

## 2018-08-26 DIAGNOSIS — Z4932 Encounter for adequacy testing for peritoneal dialysis: Secondary | ICD-10-CM | POA: Diagnosis not present

## 2018-08-26 DIAGNOSIS — D631 Anemia in chronic kidney disease: Secondary | ICD-10-CM | POA: Diagnosis not present

## 2018-08-27 DIAGNOSIS — D631 Anemia in chronic kidney disease: Secondary | ICD-10-CM | POA: Diagnosis not present

## 2018-08-27 DIAGNOSIS — Z4932 Encounter for adequacy testing for peritoneal dialysis: Secondary | ICD-10-CM | POA: Diagnosis not present

## 2018-08-27 DIAGNOSIS — N186 End stage renal disease: Secondary | ICD-10-CM | POA: Diagnosis not present

## 2018-08-27 DIAGNOSIS — Z79899 Other long term (current) drug therapy: Secondary | ICD-10-CM | POA: Diagnosis not present

## 2018-08-27 DIAGNOSIS — R17 Unspecified jaundice: Secondary | ICD-10-CM | POA: Diagnosis not present

## 2018-08-27 DIAGNOSIS — Z992 Dependence on renal dialysis: Secondary | ICD-10-CM | POA: Diagnosis not present

## 2018-08-28 DIAGNOSIS — Z79899 Other long term (current) drug therapy: Secondary | ICD-10-CM | POA: Diagnosis not present

## 2018-08-28 DIAGNOSIS — Z992 Dependence on renal dialysis: Secondary | ICD-10-CM | POA: Diagnosis not present

## 2018-08-28 DIAGNOSIS — D631 Anemia in chronic kidney disease: Secondary | ICD-10-CM | POA: Diagnosis not present

## 2018-08-28 DIAGNOSIS — R17 Unspecified jaundice: Secondary | ICD-10-CM | POA: Diagnosis not present

## 2018-08-28 DIAGNOSIS — Z4932 Encounter for adequacy testing for peritoneal dialysis: Secondary | ICD-10-CM | POA: Diagnosis not present

## 2018-08-28 DIAGNOSIS — N186 End stage renal disease: Secondary | ICD-10-CM | POA: Diagnosis not present

## 2018-08-29 DIAGNOSIS — Z992 Dependence on renal dialysis: Secondary | ICD-10-CM | POA: Diagnosis not present

## 2018-08-29 DIAGNOSIS — N186 End stage renal disease: Secondary | ICD-10-CM | POA: Diagnosis not present

## 2018-08-29 DIAGNOSIS — Z79899 Other long term (current) drug therapy: Secondary | ICD-10-CM | POA: Diagnosis not present

## 2018-08-29 DIAGNOSIS — D631 Anemia in chronic kidney disease: Secondary | ICD-10-CM | POA: Diagnosis not present

## 2018-08-29 DIAGNOSIS — Z4932 Encounter for adequacy testing for peritoneal dialysis: Secondary | ICD-10-CM | POA: Diagnosis not present

## 2018-08-29 DIAGNOSIS — R17 Unspecified jaundice: Secondary | ICD-10-CM | POA: Diagnosis not present

## 2018-08-30 DIAGNOSIS — Z992 Dependence on renal dialysis: Secondary | ICD-10-CM | POA: Diagnosis not present

## 2018-08-30 DIAGNOSIS — N186 End stage renal disease: Secondary | ICD-10-CM | POA: Diagnosis not present

## 2018-08-30 DIAGNOSIS — Z79899 Other long term (current) drug therapy: Secondary | ICD-10-CM | POA: Diagnosis not present

## 2018-08-30 DIAGNOSIS — R17 Unspecified jaundice: Secondary | ICD-10-CM | POA: Diagnosis not present

## 2018-08-30 DIAGNOSIS — D631 Anemia in chronic kidney disease: Secondary | ICD-10-CM | POA: Diagnosis not present

## 2018-08-30 DIAGNOSIS — Z4932 Encounter for adequacy testing for peritoneal dialysis: Secondary | ICD-10-CM | POA: Diagnosis not present

## 2018-08-31 DIAGNOSIS — D631 Anemia in chronic kidney disease: Secondary | ICD-10-CM | POA: Diagnosis not present

## 2018-08-31 DIAGNOSIS — R17 Unspecified jaundice: Secondary | ICD-10-CM | POA: Diagnosis not present

## 2018-08-31 DIAGNOSIS — Z992 Dependence on renal dialysis: Secondary | ICD-10-CM | POA: Diagnosis not present

## 2018-08-31 DIAGNOSIS — Z79899 Other long term (current) drug therapy: Secondary | ICD-10-CM | POA: Diagnosis not present

## 2018-08-31 DIAGNOSIS — Z4932 Encounter for adequacy testing for peritoneal dialysis: Secondary | ICD-10-CM | POA: Diagnosis not present

## 2018-08-31 DIAGNOSIS — N186 End stage renal disease: Secondary | ICD-10-CM | POA: Diagnosis not present

## 2018-09-01 DIAGNOSIS — N186 End stage renal disease: Secondary | ICD-10-CM | POA: Diagnosis not present

## 2018-09-01 DIAGNOSIS — D631 Anemia in chronic kidney disease: Secondary | ICD-10-CM | POA: Diagnosis not present

## 2018-09-01 DIAGNOSIS — Z79899 Other long term (current) drug therapy: Secondary | ICD-10-CM | POA: Diagnosis not present

## 2018-09-01 DIAGNOSIS — R17 Unspecified jaundice: Secondary | ICD-10-CM | POA: Diagnosis not present

## 2018-09-01 DIAGNOSIS — Z992 Dependence on renal dialysis: Secondary | ICD-10-CM | POA: Diagnosis not present

## 2018-09-01 DIAGNOSIS — Z4932 Encounter for adequacy testing for peritoneal dialysis: Secondary | ICD-10-CM | POA: Diagnosis not present

## 2018-09-02 DIAGNOSIS — D631 Anemia in chronic kidney disease: Secondary | ICD-10-CM | POA: Diagnosis not present

## 2018-09-02 DIAGNOSIS — Z992 Dependence on renal dialysis: Secondary | ICD-10-CM | POA: Diagnosis not present

## 2018-09-02 DIAGNOSIS — Z79899 Other long term (current) drug therapy: Secondary | ICD-10-CM | POA: Diagnosis not present

## 2018-09-02 DIAGNOSIS — Z4932 Encounter for adequacy testing for peritoneal dialysis: Secondary | ICD-10-CM | POA: Diagnosis not present

## 2018-09-02 DIAGNOSIS — R17 Unspecified jaundice: Secondary | ICD-10-CM | POA: Diagnosis not present

## 2018-09-02 DIAGNOSIS — N186 End stage renal disease: Secondary | ICD-10-CM | POA: Diagnosis not present

## 2018-09-03 DIAGNOSIS — R17 Unspecified jaundice: Secondary | ICD-10-CM | POA: Diagnosis not present

## 2018-09-03 DIAGNOSIS — Z79899 Other long term (current) drug therapy: Secondary | ICD-10-CM | POA: Diagnosis not present

## 2018-09-03 DIAGNOSIS — Z992 Dependence on renal dialysis: Secondary | ICD-10-CM | POA: Diagnosis not present

## 2018-09-03 DIAGNOSIS — Z4932 Encounter for adequacy testing for peritoneal dialysis: Secondary | ICD-10-CM | POA: Diagnosis not present

## 2018-09-03 DIAGNOSIS — N186 End stage renal disease: Secondary | ICD-10-CM | POA: Diagnosis not present

## 2018-09-03 DIAGNOSIS — D631 Anemia in chronic kidney disease: Secondary | ICD-10-CM | POA: Diagnosis not present

## 2018-09-04 DIAGNOSIS — N186 End stage renal disease: Secondary | ICD-10-CM | POA: Diagnosis not present

## 2018-09-04 DIAGNOSIS — D631 Anemia in chronic kidney disease: Secondary | ICD-10-CM | POA: Diagnosis not present

## 2018-09-04 DIAGNOSIS — Z79899 Other long term (current) drug therapy: Secondary | ICD-10-CM | POA: Diagnosis not present

## 2018-09-04 DIAGNOSIS — R17 Unspecified jaundice: Secondary | ICD-10-CM | POA: Diagnosis not present

## 2018-09-04 DIAGNOSIS — Z4932 Encounter for adequacy testing for peritoneal dialysis: Secondary | ICD-10-CM | POA: Diagnosis not present

## 2018-09-04 DIAGNOSIS — Z992 Dependence on renal dialysis: Secondary | ICD-10-CM | POA: Diagnosis not present

## 2018-09-05 DIAGNOSIS — Z4932 Encounter for adequacy testing for peritoneal dialysis: Secondary | ICD-10-CM | POA: Diagnosis not present

## 2018-09-05 DIAGNOSIS — D509 Iron deficiency anemia, unspecified: Secondary | ICD-10-CM | POA: Diagnosis not present

## 2018-09-05 DIAGNOSIS — R17 Unspecified jaundice: Secondary | ICD-10-CM | POA: Diagnosis not present

## 2018-09-05 DIAGNOSIS — M311 Thrombotic microangiopathy: Secondary | ICD-10-CM | POA: Diagnosis not present

## 2018-09-05 DIAGNOSIS — D631 Anemia in chronic kidney disease: Secondary | ICD-10-CM | POA: Diagnosis not present

## 2018-09-05 DIAGNOSIS — E440 Moderate protein-calorie malnutrition: Secondary | ICD-10-CM | POA: Diagnosis not present

## 2018-09-05 DIAGNOSIS — N186 End stage renal disease: Secondary | ICD-10-CM | POA: Diagnosis not present

## 2018-09-05 DIAGNOSIS — Z992 Dependence on renal dialysis: Secondary | ICD-10-CM | POA: Diagnosis not present

## 2018-09-05 DIAGNOSIS — Z79899 Other long term (current) drug therapy: Secondary | ICD-10-CM | POA: Diagnosis not present

## 2018-09-05 DIAGNOSIS — E876 Hypokalemia: Secondary | ICD-10-CM | POA: Diagnosis not present

## 2018-09-05 DIAGNOSIS — Z23 Encounter for immunization: Secondary | ICD-10-CM | POA: Diagnosis not present

## 2018-09-05 DIAGNOSIS — N2581 Secondary hyperparathyroidism of renal origin: Secondary | ICD-10-CM | POA: Diagnosis not present

## 2018-09-06 DIAGNOSIS — D631 Anemia in chronic kidney disease: Secondary | ICD-10-CM | POA: Diagnosis not present

## 2018-09-06 DIAGNOSIS — E876 Hypokalemia: Secondary | ICD-10-CM | POA: Diagnosis not present

## 2018-09-06 DIAGNOSIS — Z4932 Encounter for adequacy testing for peritoneal dialysis: Secondary | ICD-10-CM | POA: Diagnosis not present

## 2018-09-06 DIAGNOSIS — D509 Iron deficiency anemia, unspecified: Secondary | ICD-10-CM | POA: Diagnosis not present

## 2018-09-06 DIAGNOSIS — N186 End stage renal disease: Secondary | ICD-10-CM | POA: Diagnosis not present

## 2018-09-06 DIAGNOSIS — Z992 Dependence on renal dialysis: Secondary | ICD-10-CM | POA: Diagnosis not present

## 2018-09-07 DIAGNOSIS — Z992 Dependence on renal dialysis: Secondary | ICD-10-CM | POA: Diagnosis not present

## 2018-09-07 DIAGNOSIS — Z4932 Encounter for adequacy testing for peritoneal dialysis: Secondary | ICD-10-CM | POA: Diagnosis not present

## 2018-09-07 DIAGNOSIS — D631 Anemia in chronic kidney disease: Secondary | ICD-10-CM | POA: Diagnosis not present

## 2018-09-07 DIAGNOSIS — N186 End stage renal disease: Secondary | ICD-10-CM | POA: Diagnosis not present

## 2018-09-07 DIAGNOSIS — D509 Iron deficiency anemia, unspecified: Secondary | ICD-10-CM | POA: Diagnosis not present

## 2018-09-07 DIAGNOSIS — E876 Hypokalemia: Secondary | ICD-10-CM | POA: Diagnosis not present

## 2018-09-08 DIAGNOSIS — N186 End stage renal disease: Secondary | ICD-10-CM | POA: Diagnosis not present

## 2018-09-08 DIAGNOSIS — D509 Iron deficiency anemia, unspecified: Secondary | ICD-10-CM | POA: Diagnosis not present

## 2018-09-08 DIAGNOSIS — E876 Hypokalemia: Secondary | ICD-10-CM | POA: Diagnosis not present

## 2018-09-08 DIAGNOSIS — Z4932 Encounter for adequacy testing for peritoneal dialysis: Secondary | ICD-10-CM | POA: Diagnosis not present

## 2018-09-08 DIAGNOSIS — Z992 Dependence on renal dialysis: Secondary | ICD-10-CM | POA: Diagnosis not present

## 2018-09-08 DIAGNOSIS — D631 Anemia in chronic kidney disease: Secondary | ICD-10-CM | POA: Diagnosis not present

## 2018-09-09 DIAGNOSIS — D631 Anemia in chronic kidney disease: Secondary | ICD-10-CM | POA: Diagnosis not present

## 2018-09-09 DIAGNOSIS — D509 Iron deficiency anemia, unspecified: Secondary | ICD-10-CM | POA: Diagnosis not present

## 2018-09-09 DIAGNOSIS — Z4932 Encounter for adequacy testing for peritoneal dialysis: Secondary | ICD-10-CM | POA: Diagnosis not present

## 2018-09-09 DIAGNOSIS — Z992 Dependence on renal dialysis: Secondary | ICD-10-CM | POA: Diagnosis not present

## 2018-09-09 DIAGNOSIS — E876 Hypokalemia: Secondary | ICD-10-CM | POA: Diagnosis not present

## 2018-09-09 DIAGNOSIS — N186 End stage renal disease: Secondary | ICD-10-CM | POA: Diagnosis not present

## 2018-09-10 DIAGNOSIS — Z992 Dependence on renal dialysis: Secondary | ICD-10-CM | POA: Diagnosis not present

## 2018-09-10 DIAGNOSIS — Z4932 Encounter for adequacy testing for peritoneal dialysis: Secondary | ICD-10-CM | POA: Diagnosis not present

## 2018-09-10 DIAGNOSIS — D509 Iron deficiency anemia, unspecified: Secondary | ICD-10-CM | POA: Diagnosis not present

## 2018-09-10 DIAGNOSIS — E876 Hypokalemia: Secondary | ICD-10-CM | POA: Diagnosis not present

## 2018-09-10 DIAGNOSIS — N186 End stage renal disease: Secondary | ICD-10-CM | POA: Diagnosis not present

## 2018-09-10 DIAGNOSIS — D631 Anemia in chronic kidney disease: Secondary | ICD-10-CM | POA: Diagnosis not present

## 2018-09-11 DIAGNOSIS — Z4932 Encounter for adequacy testing for peritoneal dialysis: Secondary | ICD-10-CM | POA: Diagnosis not present

## 2018-09-11 DIAGNOSIS — Z992 Dependence on renal dialysis: Secondary | ICD-10-CM | POA: Diagnosis not present

## 2018-09-11 DIAGNOSIS — E876 Hypokalemia: Secondary | ICD-10-CM | POA: Diagnosis not present

## 2018-09-11 DIAGNOSIS — D631 Anemia in chronic kidney disease: Secondary | ICD-10-CM | POA: Diagnosis not present

## 2018-09-11 DIAGNOSIS — N186 End stage renal disease: Secondary | ICD-10-CM | POA: Diagnosis not present

## 2018-09-11 DIAGNOSIS — D509 Iron deficiency anemia, unspecified: Secondary | ICD-10-CM | POA: Diagnosis not present

## 2018-09-11 DIAGNOSIS — R82998 Other abnormal findings in urine: Secondary | ICD-10-CM | POA: Diagnosis not present

## 2018-09-12 DIAGNOSIS — E876 Hypokalemia: Secondary | ICD-10-CM | POA: Diagnosis not present

## 2018-09-12 DIAGNOSIS — D509 Iron deficiency anemia, unspecified: Secondary | ICD-10-CM | POA: Diagnosis not present

## 2018-09-12 DIAGNOSIS — N186 End stage renal disease: Secondary | ICD-10-CM | POA: Diagnosis not present

## 2018-09-12 DIAGNOSIS — Z4932 Encounter for adequacy testing for peritoneal dialysis: Secondary | ICD-10-CM | POA: Diagnosis not present

## 2018-09-12 DIAGNOSIS — D631 Anemia in chronic kidney disease: Secondary | ICD-10-CM | POA: Diagnosis not present

## 2018-09-12 DIAGNOSIS — Z992 Dependence on renal dialysis: Secondary | ICD-10-CM | POA: Diagnosis not present

## 2018-09-13 DIAGNOSIS — D631 Anemia in chronic kidney disease: Secondary | ICD-10-CM | POA: Diagnosis not present

## 2018-09-13 DIAGNOSIS — D509 Iron deficiency anemia, unspecified: Secondary | ICD-10-CM | POA: Diagnosis not present

## 2018-09-13 DIAGNOSIS — N186 End stage renal disease: Secondary | ICD-10-CM | POA: Diagnosis not present

## 2018-09-13 DIAGNOSIS — E876 Hypokalemia: Secondary | ICD-10-CM | POA: Diagnosis not present

## 2018-09-13 DIAGNOSIS — Z992 Dependence on renal dialysis: Secondary | ICD-10-CM | POA: Diagnosis not present

## 2018-09-13 DIAGNOSIS — Z4932 Encounter for adequacy testing for peritoneal dialysis: Secondary | ICD-10-CM | POA: Diagnosis not present

## 2018-09-14 DIAGNOSIS — D631 Anemia in chronic kidney disease: Secondary | ICD-10-CM | POA: Diagnosis not present

## 2018-09-14 DIAGNOSIS — Z992 Dependence on renal dialysis: Secondary | ICD-10-CM | POA: Diagnosis not present

## 2018-09-14 DIAGNOSIS — E876 Hypokalemia: Secondary | ICD-10-CM | POA: Diagnosis not present

## 2018-09-14 DIAGNOSIS — D509 Iron deficiency anemia, unspecified: Secondary | ICD-10-CM | POA: Diagnosis not present

## 2018-09-14 DIAGNOSIS — Z4932 Encounter for adequacy testing for peritoneal dialysis: Secondary | ICD-10-CM | POA: Diagnosis not present

## 2018-09-14 DIAGNOSIS — N186 End stage renal disease: Secondary | ICD-10-CM | POA: Diagnosis not present

## 2018-09-15 DIAGNOSIS — D631 Anemia in chronic kidney disease: Secondary | ICD-10-CM | POA: Diagnosis not present

## 2018-09-15 DIAGNOSIS — Z4932 Encounter for adequacy testing for peritoneal dialysis: Secondary | ICD-10-CM | POA: Diagnosis not present

## 2018-09-15 DIAGNOSIS — Z992 Dependence on renal dialysis: Secondary | ICD-10-CM | POA: Diagnosis not present

## 2018-09-15 DIAGNOSIS — E876 Hypokalemia: Secondary | ICD-10-CM | POA: Diagnosis not present

## 2018-09-15 DIAGNOSIS — D509 Iron deficiency anemia, unspecified: Secondary | ICD-10-CM | POA: Diagnosis not present

## 2018-09-15 DIAGNOSIS — N186 End stage renal disease: Secondary | ICD-10-CM | POA: Diagnosis not present

## 2018-09-16 DIAGNOSIS — E876 Hypokalemia: Secondary | ICD-10-CM | POA: Diagnosis not present

## 2018-09-16 DIAGNOSIS — Z992 Dependence on renal dialysis: Secondary | ICD-10-CM | POA: Diagnosis not present

## 2018-09-16 DIAGNOSIS — D509 Iron deficiency anemia, unspecified: Secondary | ICD-10-CM | POA: Diagnosis not present

## 2018-09-16 DIAGNOSIS — N186 End stage renal disease: Secondary | ICD-10-CM | POA: Diagnosis not present

## 2018-09-16 DIAGNOSIS — D631 Anemia in chronic kidney disease: Secondary | ICD-10-CM | POA: Diagnosis not present

## 2018-09-16 DIAGNOSIS — Z4932 Encounter for adequacy testing for peritoneal dialysis: Secondary | ICD-10-CM | POA: Diagnosis not present

## 2018-09-17 DIAGNOSIS — Z4932 Encounter for adequacy testing for peritoneal dialysis: Secondary | ICD-10-CM | POA: Diagnosis not present

## 2018-09-17 DIAGNOSIS — D631 Anemia in chronic kidney disease: Secondary | ICD-10-CM | POA: Diagnosis not present

## 2018-09-17 DIAGNOSIS — Z992 Dependence on renal dialysis: Secondary | ICD-10-CM | POA: Diagnosis not present

## 2018-09-17 DIAGNOSIS — E876 Hypokalemia: Secondary | ICD-10-CM | POA: Diagnosis not present

## 2018-09-17 DIAGNOSIS — N186 End stage renal disease: Secondary | ICD-10-CM | POA: Diagnosis not present

## 2018-09-17 DIAGNOSIS — D509 Iron deficiency anemia, unspecified: Secondary | ICD-10-CM | POA: Diagnosis not present

## 2018-09-18 DIAGNOSIS — D631 Anemia in chronic kidney disease: Secondary | ICD-10-CM | POA: Diagnosis not present

## 2018-09-18 DIAGNOSIS — D509 Iron deficiency anemia, unspecified: Secondary | ICD-10-CM | POA: Diagnosis not present

## 2018-09-18 DIAGNOSIS — E876 Hypokalemia: Secondary | ICD-10-CM | POA: Diagnosis not present

## 2018-09-18 DIAGNOSIS — Z992 Dependence on renal dialysis: Secondary | ICD-10-CM | POA: Diagnosis not present

## 2018-09-18 DIAGNOSIS — Z4932 Encounter for adequacy testing for peritoneal dialysis: Secondary | ICD-10-CM | POA: Diagnosis not present

## 2018-09-18 DIAGNOSIS — N186 End stage renal disease: Secondary | ICD-10-CM | POA: Diagnosis not present

## 2018-09-19 ENCOUNTER — Other Ambulatory Visit: Payer: Self-pay

## 2018-09-19 ENCOUNTER — Ambulatory Visit
Admission: RE | Admit: 2018-09-19 | Discharge: 2018-09-19 | Disposition: A | Discharge status: Home / Self Care | Payer: Medicare Other | Source: Ambulatory Visit | Attending: Neurology | Admitting: Neurology

## 2018-09-19 DIAGNOSIS — D631 Anemia in chronic kidney disease: Secondary | ICD-10-CM | POA: Diagnosis not present

## 2018-09-19 DIAGNOSIS — N186 End stage renal disease: Secondary | ICD-10-CM | POA: Diagnosis not present

## 2018-09-19 DIAGNOSIS — G459 Transient cerebral ischemic attack, unspecified: Secondary | ICD-10-CM | POA: Diagnosis not present

## 2018-09-19 DIAGNOSIS — E876 Hypokalemia: Secondary | ICD-10-CM | POA: Diagnosis not present

## 2018-09-19 DIAGNOSIS — Z4932 Encounter for adequacy testing for peritoneal dialysis: Secondary | ICD-10-CM | POA: Diagnosis not present

## 2018-09-19 DIAGNOSIS — D509 Iron deficiency anemia, unspecified: Secondary | ICD-10-CM | POA: Diagnosis not present

## 2018-09-19 DIAGNOSIS — Z992 Dependence on renal dialysis: Secondary | ICD-10-CM | POA: Diagnosis not present

## 2018-09-20 DIAGNOSIS — N186 End stage renal disease: Secondary | ICD-10-CM | POA: Diagnosis not present

## 2018-09-20 DIAGNOSIS — D509 Iron deficiency anemia, unspecified: Secondary | ICD-10-CM | POA: Diagnosis not present

## 2018-09-20 DIAGNOSIS — Z992 Dependence on renal dialysis: Secondary | ICD-10-CM | POA: Diagnosis not present

## 2018-09-20 DIAGNOSIS — D631 Anemia in chronic kidney disease: Secondary | ICD-10-CM | POA: Diagnosis not present

## 2018-09-20 DIAGNOSIS — Z4932 Encounter for adequacy testing for peritoneal dialysis: Secondary | ICD-10-CM | POA: Diagnosis not present

## 2018-09-20 DIAGNOSIS — E876 Hypokalemia: Secondary | ICD-10-CM | POA: Diagnosis not present

## 2018-09-21 DIAGNOSIS — D509 Iron deficiency anemia, unspecified: Secondary | ICD-10-CM | POA: Diagnosis not present

## 2018-09-21 DIAGNOSIS — D631 Anemia in chronic kidney disease: Secondary | ICD-10-CM | POA: Diagnosis not present

## 2018-09-21 DIAGNOSIS — Z992 Dependence on renal dialysis: Secondary | ICD-10-CM | POA: Diagnosis not present

## 2018-09-21 DIAGNOSIS — E876 Hypokalemia: Secondary | ICD-10-CM | POA: Diagnosis not present

## 2018-09-21 DIAGNOSIS — Z4932 Encounter for adequacy testing for peritoneal dialysis: Secondary | ICD-10-CM | POA: Diagnosis not present

## 2018-09-21 DIAGNOSIS — N186 End stage renal disease: Secondary | ICD-10-CM | POA: Diagnosis not present

## 2018-09-22 DIAGNOSIS — Z992 Dependence on renal dialysis: Secondary | ICD-10-CM | POA: Diagnosis not present

## 2018-09-22 DIAGNOSIS — E876 Hypokalemia: Secondary | ICD-10-CM | POA: Diagnosis not present

## 2018-09-22 DIAGNOSIS — N186 End stage renal disease: Secondary | ICD-10-CM | POA: Diagnosis not present

## 2018-09-22 DIAGNOSIS — D509 Iron deficiency anemia, unspecified: Secondary | ICD-10-CM | POA: Diagnosis not present

## 2018-09-22 DIAGNOSIS — Z4932 Encounter for adequacy testing for peritoneal dialysis: Secondary | ICD-10-CM | POA: Diagnosis not present

## 2018-09-22 DIAGNOSIS — D631 Anemia in chronic kidney disease: Secondary | ICD-10-CM | POA: Diagnosis not present

## 2018-09-23 DIAGNOSIS — Z4932 Encounter for adequacy testing for peritoneal dialysis: Secondary | ICD-10-CM | POA: Diagnosis not present

## 2018-09-23 DIAGNOSIS — D631 Anemia in chronic kidney disease: Secondary | ICD-10-CM | POA: Diagnosis not present

## 2018-09-23 DIAGNOSIS — D509 Iron deficiency anemia, unspecified: Secondary | ICD-10-CM | POA: Diagnosis not present

## 2018-09-23 DIAGNOSIS — Z992 Dependence on renal dialysis: Secondary | ICD-10-CM | POA: Diagnosis not present

## 2018-09-23 DIAGNOSIS — E876 Hypokalemia: Secondary | ICD-10-CM | POA: Diagnosis not present

## 2018-09-23 DIAGNOSIS — N186 End stage renal disease: Secondary | ICD-10-CM | POA: Diagnosis not present

## 2018-09-24 DIAGNOSIS — D631 Anemia in chronic kidney disease: Secondary | ICD-10-CM | POA: Diagnosis not present

## 2018-09-24 DIAGNOSIS — N186 End stage renal disease: Secondary | ICD-10-CM | POA: Diagnosis not present

## 2018-09-24 DIAGNOSIS — Z992 Dependence on renal dialysis: Secondary | ICD-10-CM | POA: Diagnosis not present

## 2018-09-24 DIAGNOSIS — Z4932 Encounter for adequacy testing for peritoneal dialysis: Secondary | ICD-10-CM | POA: Diagnosis not present

## 2018-09-24 DIAGNOSIS — E876 Hypokalemia: Secondary | ICD-10-CM | POA: Diagnosis not present

## 2018-09-24 DIAGNOSIS — D509 Iron deficiency anemia, unspecified: Secondary | ICD-10-CM | POA: Diagnosis not present

## 2018-09-25 DIAGNOSIS — E876 Hypokalemia: Secondary | ICD-10-CM | POA: Diagnosis not present

## 2018-09-25 DIAGNOSIS — Z992 Dependence on renal dialysis: Secondary | ICD-10-CM | POA: Diagnosis not present

## 2018-09-25 DIAGNOSIS — Z4932 Encounter for adequacy testing for peritoneal dialysis: Secondary | ICD-10-CM | POA: Diagnosis not present

## 2018-09-25 DIAGNOSIS — N186 End stage renal disease: Secondary | ICD-10-CM | POA: Diagnosis not present

## 2018-09-25 DIAGNOSIS — D631 Anemia in chronic kidney disease: Secondary | ICD-10-CM | POA: Diagnosis not present

## 2018-09-25 DIAGNOSIS — D509 Iron deficiency anemia, unspecified: Secondary | ICD-10-CM | POA: Diagnosis not present

## 2018-09-26 DIAGNOSIS — D509 Iron deficiency anemia, unspecified: Secondary | ICD-10-CM | POA: Diagnosis not present

## 2018-09-26 DIAGNOSIS — N186 End stage renal disease: Secondary | ICD-10-CM | POA: Diagnosis not present

## 2018-09-26 DIAGNOSIS — Z992 Dependence on renal dialysis: Secondary | ICD-10-CM | POA: Diagnosis not present

## 2018-09-26 DIAGNOSIS — Z4932 Encounter for adequacy testing for peritoneal dialysis: Secondary | ICD-10-CM | POA: Diagnosis not present

## 2018-09-26 DIAGNOSIS — E876 Hypokalemia: Secondary | ICD-10-CM | POA: Diagnosis not present

## 2018-09-26 DIAGNOSIS — D631 Anemia in chronic kidney disease: Secondary | ICD-10-CM | POA: Diagnosis not present

## 2018-09-27 DIAGNOSIS — E876 Hypokalemia: Secondary | ICD-10-CM | POA: Diagnosis not present

## 2018-09-27 DIAGNOSIS — D509 Iron deficiency anemia, unspecified: Secondary | ICD-10-CM | POA: Diagnosis not present

## 2018-09-27 DIAGNOSIS — D631 Anemia in chronic kidney disease: Secondary | ICD-10-CM | POA: Diagnosis not present

## 2018-09-27 DIAGNOSIS — N186 End stage renal disease: Secondary | ICD-10-CM | POA: Diagnosis not present

## 2018-09-27 DIAGNOSIS — Z992 Dependence on renal dialysis: Secondary | ICD-10-CM | POA: Diagnosis not present

## 2018-09-27 DIAGNOSIS — Z4932 Encounter for adequacy testing for peritoneal dialysis: Secondary | ICD-10-CM | POA: Diagnosis not present

## 2018-09-28 DIAGNOSIS — Z992 Dependence on renal dialysis: Secondary | ICD-10-CM | POA: Diagnosis not present

## 2018-09-28 DIAGNOSIS — E876 Hypokalemia: Secondary | ICD-10-CM | POA: Diagnosis not present

## 2018-09-28 DIAGNOSIS — D509 Iron deficiency anemia, unspecified: Secondary | ICD-10-CM | POA: Diagnosis not present

## 2018-09-28 DIAGNOSIS — D631 Anemia in chronic kidney disease: Secondary | ICD-10-CM | POA: Diagnosis not present

## 2018-09-28 DIAGNOSIS — N186 End stage renal disease: Secondary | ICD-10-CM | POA: Diagnosis not present

## 2018-09-28 DIAGNOSIS — Z4932 Encounter for adequacy testing for peritoneal dialysis: Secondary | ICD-10-CM | POA: Diagnosis not present

## 2018-09-29 DIAGNOSIS — D631 Anemia in chronic kidney disease: Secondary | ICD-10-CM | POA: Diagnosis not present

## 2018-09-29 DIAGNOSIS — Z4932 Encounter for adequacy testing for peritoneal dialysis: Secondary | ICD-10-CM | POA: Diagnosis not present

## 2018-09-29 DIAGNOSIS — E876 Hypokalemia: Secondary | ICD-10-CM | POA: Diagnosis not present

## 2018-09-29 DIAGNOSIS — N186 End stage renal disease: Secondary | ICD-10-CM | POA: Diagnosis not present

## 2018-09-29 DIAGNOSIS — D509 Iron deficiency anemia, unspecified: Secondary | ICD-10-CM | POA: Diagnosis not present

## 2018-09-29 DIAGNOSIS — Z992 Dependence on renal dialysis: Secondary | ICD-10-CM | POA: Diagnosis not present

## 2018-09-30 DIAGNOSIS — I132 Hypertensive heart and chronic kidney disease with heart failure and with stage 5 chronic kidney disease, or end stage renal disease: Secondary | ICD-10-CM | POA: Diagnosis not present

## 2018-09-30 DIAGNOSIS — Z20828 Contact with and (suspected) exposure to other viral communicable diseases: Secondary | ICD-10-CM | POA: Diagnosis present

## 2018-09-30 DIAGNOSIS — L7632 Postprocedural hematoma of skin and subcutaneous tissue following other procedure: Secondary | ICD-10-CM | POA: Diagnosis not present

## 2018-09-30 DIAGNOSIS — E871 Hypo-osmolality and hyponatremia: Secondary | ICD-10-CM | POA: Diagnosis not present

## 2018-09-30 DIAGNOSIS — I482 Chronic atrial fibrillation, unspecified: Secondary | ICD-10-CM | POA: Diagnosis not present

## 2018-09-30 DIAGNOSIS — T82898A Other specified complication of vascular prosthetic devices, implants and grafts, initial encounter: Secondary | ICD-10-CM | POA: Diagnosis not present

## 2018-09-30 DIAGNOSIS — N186 End stage renal disease: Secondary | ICD-10-CM | POA: Diagnosis not present

## 2018-09-30 DIAGNOSIS — E876 Hypokalemia: Secondary | ICD-10-CM | POA: Diagnosis not present

## 2018-09-30 DIAGNOSIS — N261 Atrophy of kidney (terminal): Secondary | ICD-10-CM | POA: Diagnosis not present

## 2018-09-30 DIAGNOSIS — Z781 Physical restraint status: Secondary | ICD-10-CM | POA: Diagnosis not present

## 2018-09-30 DIAGNOSIS — T80818A Extravasation of other vesicant agent, initial encounter: Secondary | ICD-10-CM | POA: Diagnosis not present

## 2018-09-30 DIAGNOSIS — R34 Anuria and oliguria: Secondary | ICD-10-CM | POA: Diagnosis not present

## 2018-09-30 DIAGNOSIS — I700 Atherosclerosis of aorta: Secondary | ICD-10-CM | POA: Diagnosis not present

## 2018-09-30 DIAGNOSIS — E875 Hyperkalemia: Secondary | ICD-10-CM | POA: Diagnosis not present

## 2018-09-30 DIAGNOSIS — R944 Abnormal results of kidney function studies: Secondary | ICD-10-CM | POA: Diagnosis not present

## 2018-09-30 DIAGNOSIS — D593 Hemolytic-uremic syndrome: Secondary | ICD-10-CM | POA: Diagnosis not present

## 2018-09-30 DIAGNOSIS — E872 Acidosis: Secondary | ICD-10-CM | POA: Diagnosis not present

## 2018-09-30 DIAGNOSIS — I5020 Unspecified systolic (congestive) heart failure: Secondary | ICD-10-CM | POA: Diagnosis not present

## 2018-09-30 DIAGNOSIS — I499 Cardiac arrhythmia, unspecified: Secondary | ICD-10-CM | POA: Diagnosis not present

## 2018-09-30 DIAGNOSIS — M069 Rheumatoid arthritis, unspecified: Secondary | ICD-10-CM | POA: Diagnosis not present

## 2018-09-30 DIAGNOSIS — R410 Disorientation, unspecified: Secondary | ICD-10-CM | POA: Diagnosis not present

## 2018-09-30 DIAGNOSIS — D509 Iron deficiency anemia, unspecified: Secondary | ICD-10-CM | POA: Diagnosis not present

## 2018-09-30 DIAGNOSIS — N08 Glomerular disorders in diseases classified elsewhere: Secondary | ICD-10-CM | POA: Diagnosis not present

## 2018-09-30 DIAGNOSIS — Z4822 Encounter for aftercare following kidney transplant: Secondary | ICD-10-CM | POA: Diagnosis not present

## 2018-09-30 DIAGNOSIS — I120 Hypertensive chronic kidney disease with stage 5 chronic kidney disease or end stage renal disease: Secondary | ICD-10-CM | POA: Diagnosis not present

## 2018-09-30 DIAGNOSIS — D631 Anemia in chronic kidney disease: Secondary | ICD-10-CM | POA: Diagnosis not present

## 2018-09-30 DIAGNOSIS — D62 Acute posthemorrhagic anemia: Secondary | ICD-10-CM | POA: Diagnosis not present

## 2018-09-30 DIAGNOSIS — Z992 Dependence on renal dialysis: Secondary | ICD-10-CM | POA: Diagnosis not present

## 2018-09-30 DIAGNOSIS — I517 Cardiomegaly: Secondary | ICD-10-CM | POA: Diagnosis not present

## 2018-09-30 DIAGNOSIS — Z5181 Encounter for therapeutic drug level monitoring: Secondary | ICD-10-CM | POA: Diagnosis not present

## 2018-09-30 DIAGNOSIS — E8770 Fluid overload, unspecified: Secondary | ICD-10-CM | POA: Diagnosis not present

## 2018-09-30 DIAGNOSIS — Z4932 Encounter for adequacy testing for peritoneal dialysis: Secondary | ICD-10-CM | POA: Diagnosis not present

## 2018-09-30 DIAGNOSIS — N269 Renal sclerosis, unspecified: Secondary | ICD-10-CM | POA: Diagnosis not present

## 2018-09-30 DIAGNOSIS — E8809 Other disorders of plasma-protein metabolism, not elsewhere classified: Secondary | ICD-10-CM | POA: Diagnosis not present

## 2018-09-30 DIAGNOSIS — I429 Cardiomyopathy, unspecified: Secondary | ICD-10-CM | POA: Diagnosis present

## 2018-09-30 DIAGNOSIS — D696 Thrombocytopenia, unspecified: Secondary | ICD-10-CM | POA: Diagnosis not present

## 2018-09-30 DIAGNOSIS — Z01818 Encounter for other preprocedural examination: Secondary | ICD-10-CM | POA: Diagnosis not present

## 2018-09-30 DIAGNOSIS — R4182 Altered mental status, unspecified: Secondary | ICD-10-CM | POA: Diagnosis not present

## 2018-09-30 DIAGNOSIS — G92 Toxic encephalopathy: Secondary | ICD-10-CM | POA: Diagnosis not present

## 2018-09-30 DIAGNOSIS — Z79899 Other long term (current) drug therapy: Secondary | ICD-10-CM | POA: Diagnosis not present

## 2018-09-30 DIAGNOSIS — Z960 Presence of urogenital implants: Secondary | ICD-10-CM | POA: Diagnosis not present

## 2018-09-30 DIAGNOSIS — T8571XA Infection and inflammatory reaction due to peritoneal dialysis catheter, initial encounter: Secondary | ICD-10-CM | POA: Diagnosis not present

## 2018-09-30 DIAGNOSIS — I4891 Unspecified atrial fibrillation: Secondary | ICD-10-CM | POA: Diagnosis not present

## 2018-09-30 DIAGNOSIS — T8619 Other complication of kidney transplant: Secondary | ICD-10-CM | POA: Diagnosis not present

## 2018-09-30 DIAGNOSIS — I440 Atrioventricular block, first degree: Secondary | ICD-10-CM | POA: Diagnosis not present

## 2018-09-30 DIAGNOSIS — D6959 Other secondary thrombocytopenia: Secondary | ICD-10-CM | POA: Diagnosis not present

## 2018-09-30 DIAGNOSIS — I447 Left bundle-branch block, unspecified: Secondary | ICD-10-CM | POA: Diagnosis not present

## 2018-09-30 DIAGNOSIS — J90 Pleural effusion, not elsewhere classified: Secondary | ICD-10-CM | POA: Diagnosis not present

## 2018-09-30 DIAGNOSIS — Z940 Kidney transplant status: Secondary | ICD-10-CM | POA: Diagnosis not present

## 2018-09-30 DIAGNOSIS — S40022A Contusion of left upper arm, initial encounter: Secondary | ICD-10-CM | POA: Diagnosis not present

## 2018-09-30 DIAGNOSIS — T8612 Kidney transplant failure: Secondary | ICD-10-CM | POA: Diagnosis not present

## 2018-09-30 DIAGNOSIS — Z792 Long term (current) use of antibiotics: Secondary | ICD-10-CM | POA: Diagnosis not present

## 2018-09-30 DIAGNOSIS — I10 Essential (primary) hypertension: Secondary | ICD-10-CM | POA: Diagnosis not present

## 2018-09-30 DIAGNOSIS — N2581 Secondary hyperparathyroidism of renal origin: Secondary | ICD-10-CM | POA: Diagnosis present

## 2018-09-30 DIAGNOSIS — G9340 Encephalopathy, unspecified: Secondary | ICD-10-CM | POA: Diagnosis not present

## 2018-09-30 DIAGNOSIS — I5022 Chronic systolic (congestive) heart failure: Secondary | ICD-10-CM | POA: Diagnosis not present

## 2018-09-30 DIAGNOSIS — R5381 Other malaise: Secondary | ICD-10-CM | POA: Diagnosis not present

## 2018-09-30 DIAGNOSIS — I471 Supraventricular tachycardia: Secondary | ICD-10-CM | POA: Diagnosis present

## 2018-09-30 DIAGNOSIS — D649 Anemia, unspecified: Secondary | ICD-10-CM | POA: Diagnosis not present

## 2018-09-30 DIAGNOSIS — Z0389 Encounter for observation for other suspected diseases and conditions ruled out: Secondary | ICD-10-CM | POA: Diagnosis not present

## 2018-09-30 DIAGNOSIS — Z7952 Long term (current) use of systemic steroids: Secondary | ICD-10-CM | POA: Diagnosis not present

## 2018-09-30 DIAGNOSIS — Z4902 Encounter for fitting and adjustment of peritoneal dialysis catheter: Secondary | ICD-10-CM | POA: Diagnosis not present

## 2018-09-30 DIAGNOSIS — S301XXA Contusion of abdominal wall, initial encounter: Secondary | ICD-10-CM | POA: Diagnosis not present

## 2018-09-30 DIAGNOSIS — I498 Other specified cardiac arrhythmias: Secondary | ICD-10-CM | POA: Diagnosis not present

## 2018-09-30 DIAGNOSIS — I5189 Other ill-defined heart diseases: Secondary | ICD-10-CM | POA: Diagnosis not present

## 2018-09-30 DIAGNOSIS — I2510 Atherosclerotic heart disease of native coronary artery without angina pectoris: Secondary | ICD-10-CM | POA: Diagnosis not present

## 2018-09-30 DIAGNOSIS — I150 Renovascular hypertension: Secondary | ICD-10-CM | POA: Diagnosis not present

## 2018-09-30 DIAGNOSIS — I959 Hypotension, unspecified: Secondary | ICD-10-CM | POA: Diagnosis not present

## 2018-10-01 DIAGNOSIS — D509 Iron deficiency anemia, unspecified: Secondary | ICD-10-CM | POA: Diagnosis not present

## 2018-10-01 DIAGNOSIS — D631 Anemia in chronic kidney disease: Secondary | ICD-10-CM | POA: Diagnosis not present

## 2018-10-01 DIAGNOSIS — E876 Hypokalemia: Secondary | ICD-10-CM | POA: Diagnosis not present

## 2018-10-01 DIAGNOSIS — Z4932 Encounter for adequacy testing for peritoneal dialysis: Secondary | ICD-10-CM | POA: Diagnosis not present

## 2018-10-01 DIAGNOSIS — Z992 Dependence on renal dialysis: Secondary | ICD-10-CM | POA: Diagnosis not present

## 2018-10-01 DIAGNOSIS — N186 End stage renal disease: Secondary | ICD-10-CM | POA: Diagnosis not present

## 2018-10-14 DIAGNOSIS — Z992 Dependence on renal dialysis: Secondary | ICD-10-CM | POA: Diagnosis not present

## 2018-10-14 DIAGNOSIS — Z940 Kidney transplant status: Secondary | ICD-10-CM | POA: Diagnosis not present

## 2018-10-14 DIAGNOSIS — I120 Hypertensive chronic kidney disease with stage 5 chronic kidney disease or end stage renal disease: Secondary | ICD-10-CM | POA: Diagnosis not present

## 2018-10-14 DIAGNOSIS — Z4822 Encounter for aftercare following kidney transplant: Secondary | ICD-10-CM | POA: Diagnosis not present

## 2018-10-14 DIAGNOSIS — E8770 Fluid overload, unspecified: Secondary | ICD-10-CM | POA: Diagnosis not present

## 2018-10-14 DIAGNOSIS — Z79899 Other long term (current) drug therapy: Secondary | ICD-10-CM | POA: Diagnosis not present

## 2018-10-14 DIAGNOSIS — T8612 Kidney transplant failure: Secondary | ICD-10-CM | POA: Diagnosis not present

## 2018-10-14 DIAGNOSIS — Z7952 Long term (current) use of systemic steroids: Secondary | ICD-10-CM | POA: Diagnosis not present

## 2018-10-14 DIAGNOSIS — E872 Acidosis: Secondary | ICD-10-CM | POA: Diagnosis not present

## 2018-10-14 DIAGNOSIS — N186 End stage renal disease: Secondary | ICD-10-CM | POA: Diagnosis not present

## 2018-10-15 DIAGNOSIS — Z4822 Encounter for aftercare following kidney transplant: Secondary | ICD-10-CM | POA: Diagnosis not present

## 2018-10-15 DIAGNOSIS — Z992 Dependence on renal dialysis: Secondary | ICD-10-CM | POA: Diagnosis not present

## 2018-10-15 DIAGNOSIS — N186 End stage renal disease: Secondary | ICD-10-CM | POA: Diagnosis not present

## 2018-10-15 DIAGNOSIS — Z7952 Long term (current) use of systemic steroids: Secondary | ICD-10-CM | POA: Diagnosis not present

## 2018-10-15 DIAGNOSIS — E872 Acidosis: Secondary | ICD-10-CM | POA: Diagnosis not present

## 2018-10-15 DIAGNOSIS — T8612 Kidney transplant failure: Secondary | ICD-10-CM | POA: Diagnosis not present

## 2018-10-15 DIAGNOSIS — E8770 Fluid overload, unspecified: Secondary | ICD-10-CM | POA: Diagnosis not present

## 2018-10-15 DIAGNOSIS — Z79899 Other long term (current) drug therapy: Secondary | ICD-10-CM | POA: Diagnosis not present

## 2018-10-15 DIAGNOSIS — Z940 Kidney transplant status: Secondary | ICD-10-CM | POA: Diagnosis not present

## 2018-10-15 DIAGNOSIS — T82838A Hemorrhage of vascular prosthetic devices, implants and grafts, initial encounter: Secondary | ICD-10-CM | POA: Diagnosis not present

## 2018-10-15 DIAGNOSIS — I120 Hypertensive chronic kidney disease with stage 5 chronic kidney disease or end stage renal disease: Secondary | ICD-10-CM | POA: Diagnosis not present

## 2018-10-16 DIAGNOSIS — I120 Hypertensive chronic kidney disease with stage 5 chronic kidney disease or end stage renal disease: Secondary | ICD-10-CM | POA: Diagnosis not present

## 2018-10-16 DIAGNOSIS — N186 End stage renal disease: Secondary | ICD-10-CM | POA: Diagnosis not present

## 2018-10-16 DIAGNOSIS — D649 Anemia, unspecified: Secondary | ICD-10-CM | POA: Diagnosis not present

## 2018-10-16 DIAGNOSIS — T8612 Kidney transplant failure: Secondary | ICD-10-CM | POA: Diagnosis not present

## 2018-10-16 DIAGNOSIS — Z992 Dependence on renal dialysis: Secondary | ICD-10-CM | POA: Diagnosis not present

## 2018-10-17 DIAGNOSIS — Z940 Kidney transplant status: Secondary | ICD-10-CM | POA: Diagnosis not present

## 2018-10-17 DIAGNOSIS — R5381 Other malaise: Secondary | ICD-10-CM | POA: Diagnosis not present

## 2018-10-17 DIAGNOSIS — N186 End stage renal disease: Secondary | ICD-10-CM | POA: Diagnosis not present

## 2018-10-17 DIAGNOSIS — Z992 Dependence on renal dialysis: Secondary | ICD-10-CM | POA: Diagnosis not present

## 2018-10-17 DIAGNOSIS — Z7409 Other reduced mobility: Secondary | ICD-10-CM | POA: Diagnosis not present

## 2018-10-17 DIAGNOSIS — I7300 Raynaud's syndrome without gangrene: Secondary | ICD-10-CM | POA: Diagnosis not present

## 2018-10-17 DIAGNOSIS — I120 Hypertensive chronic kidney disease with stage 5 chronic kidney disease or end stage renal disease: Secondary | ICD-10-CM | POA: Diagnosis not present

## 2018-10-17 DIAGNOSIS — M069 Rheumatoid arthritis, unspecified: Secondary | ICD-10-CM | POA: Diagnosis not present

## 2018-10-18 DIAGNOSIS — Z992 Dependence on renal dialysis: Secondary | ICD-10-CM | POA: Diagnosis not present

## 2018-10-18 DIAGNOSIS — I7300 Raynaud's syndrome without gangrene: Secondary | ICD-10-CM | POA: Diagnosis not present

## 2018-10-18 DIAGNOSIS — T8612 Kidney transplant failure: Secondary | ICD-10-CM | POA: Diagnosis not present

## 2018-10-18 DIAGNOSIS — I120 Hypertensive chronic kidney disease with stage 5 chronic kidney disease or end stage renal disease: Secondary | ICD-10-CM | POA: Diagnosis not present

## 2018-10-18 DIAGNOSIS — N186 End stage renal disease: Secondary | ICD-10-CM | POA: Diagnosis not present

## 2018-10-18 DIAGNOSIS — R5381 Other malaise: Secondary | ICD-10-CM | POA: Diagnosis not present

## 2018-10-18 DIAGNOSIS — Z940 Kidney transplant status: Secondary | ICD-10-CM | POA: Diagnosis not present

## 2018-10-18 DIAGNOSIS — M069 Rheumatoid arthritis, unspecified: Secondary | ICD-10-CM | POA: Diagnosis not present

## 2018-10-18 DIAGNOSIS — D649 Anemia, unspecified: Secondary | ICD-10-CM | POA: Diagnosis not present

## 2018-10-18 DIAGNOSIS — Z7409 Other reduced mobility: Secondary | ICD-10-CM | POA: Diagnosis not present

## 2018-10-19 DIAGNOSIS — D649 Anemia, unspecified: Secondary | ICD-10-CM | POA: Diagnosis not present

## 2018-10-19 DIAGNOSIS — Z940 Kidney transplant status: Secondary | ICD-10-CM | POA: Diagnosis not present

## 2018-10-19 DIAGNOSIS — N186 End stage renal disease: Secondary | ICD-10-CM | POA: Diagnosis not present

## 2018-10-19 DIAGNOSIS — R5381 Other malaise: Secondary | ICD-10-CM | POA: Diagnosis not present

## 2018-10-19 DIAGNOSIS — T8612 Kidney transplant failure: Secondary | ICD-10-CM | POA: Diagnosis not present

## 2018-10-19 DIAGNOSIS — I120 Hypertensive chronic kidney disease with stage 5 chronic kidney disease or end stage renal disease: Secondary | ICD-10-CM | POA: Diagnosis not present

## 2018-10-19 DIAGNOSIS — Z7409 Other reduced mobility: Secondary | ICD-10-CM | POA: Diagnosis not present

## 2018-10-19 DIAGNOSIS — D593 Hemolytic-uremic syndrome: Secondary | ICD-10-CM | POA: Diagnosis not present

## 2018-10-19 DIAGNOSIS — Z992 Dependence on renal dialysis: Secondary | ICD-10-CM | POA: Diagnosis not present

## 2018-10-19 DIAGNOSIS — Z79899 Other long term (current) drug therapy: Secondary | ICD-10-CM | POA: Diagnosis not present

## 2018-10-19 DIAGNOSIS — M069 Rheumatoid arthritis, unspecified: Secondary | ICD-10-CM | POA: Diagnosis not present

## 2018-10-19 DIAGNOSIS — I7300 Raynaud's syndrome without gangrene: Secondary | ICD-10-CM | POA: Diagnosis not present

## 2018-10-20 DIAGNOSIS — Z7409 Other reduced mobility: Secondary | ICD-10-CM | POA: Diagnosis not present

## 2018-10-20 DIAGNOSIS — N186 End stage renal disease: Secondary | ICD-10-CM | POA: Diagnosis not present

## 2018-10-20 DIAGNOSIS — D593 Hemolytic-uremic syndrome: Secondary | ICD-10-CM | POA: Diagnosis not present

## 2018-10-20 DIAGNOSIS — D649 Anemia, unspecified: Secondary | ICD-10-CM | POA: Diagnosis not present

## 2018-10-20 DIAGNOSIS — R5381 Other malaise: Secondary | ICD-10-CM | POA: Diagnosis not present

## 2018-10-20 DIAGNOSIS — I7300 Raynaud's syndrome without gangrene: Secondary | ICD-10-CM | POA: Diagnosis not present

## 2018-10-20 DIAGNOSIS — I120 Hypertensive chronic kidney disease with stage 5 chronic kidney disease or end stage renal disease: Secondary | ICD-10-CM | POA: Diagnosis not present

## 2018-10-20 DIAGNOSIS — T8612 Kidney transplant failure: Secondary | ICD-10-CM | POA: Diagnosis not present

## 2018-10-20 DIAGNOSIS — M069 Rheumatoid arthritis, unspecified: Secondary | ICD-10-CM | POA: Diagnosis not present

## 2018-10-20 DIAGNOSIS — Z79899 Other long term (current) drug therapy: Secondary | ICD-10-CM | POA: Diagnosis not present

## 2018-10-20 DIAGNOSIS — Z940 Kidney transplant status: Secondary | ICD-10-CM | POA: Diagnosis not present

## 2018-10-20 DIAGNOSIS — Z992 Dependence on renal dialysis: Secondary | ICD-10-CM | POA: Diagnosis not present

## 2018-10-21 DIAGNOSIS — Z992 Dependence on renal dialysis: Secondary | ICD-10-CM | POA: Diagnosis not present

## 2018-10-21 DIAGNOSIS — N186 End stage renal disease: Secondary | ICD-10-CM | POA: Diagnosis not present

## 2018-10-22 DIAGNOSIS — N186 End stage renal disease: Secondary | ICD-10-CM | POA: Diagnosis not present

## 2018-10-22 DIAGNOSIS — D593 Hemolytic-uremic syndrome: Secondary | ICD-10-CM | POA: Diagnosis not present

## 2018-10-22 DIAGNOSIS — Z7952 Long term (current) use of systemic steroids: Secondary | ICD-10-CM | POA: Diagnosis not present

## 2018-10-22 DIAGNOSIS — I120 Hypertensive chronic kidney disease with stage 5 chronic kidney disease or end stage renal disease: Secondary | ICD-10-CM | POA: Diagnosis not present

## 2018-10-22 DIAGNOSIS — Z992 Dependence on renal dialysis: Secondary | ICD-10-CM | POA: Diagnosis not present

## 2018-10-22 DIAGNOSIS — Z940 Kidney transplant status: Secondary | ICD-10-CM | POA: Diagnosis not present

## 2018-10-22 DIAGNOSIS — Z79899 Other long term (current) drug therapy: Secondary | ICD-10-CM | POA: Diagnosis not present

## 2018-10-22 DIAGNOSIS — T8612 Kidney transplant failure: Secondary | ICD-10-CM | POA: Diagnosis not present

## 2018-10-23 DIAGNOSIS — Z992 Dependence on renal dialysis: Secondary | ICD-10-CM | POA: Diagnosis not present

## 2018-10-23 DIAGNOSIS — R5381 Other malaise: Secondary | ICD-10-CM | POA: Diagnosis not present

## 2018-10-23 DIAGNOSIS — I7300 Raynaud's syndrome without gangrene: Secondary | ICD-10-CM | POA: Diagnosis not present

## 2018-10-23 DIAGNOSIS — I120 Hypertensive chronic kidney disease with stage 5 chronic kidney disease or end stage renal disease: Secondary | ICD-10-CM | POA: Diagnosis not present

## 2018-10-23 DIAGNOSIS — Z79899 Other long term (current) drug therapy: Secondary | ICD-10-CM | POA: Diagnosis not present

## 2018-10-23 DIAGNOSIS — D649 Anemia, unspecified: Secondary | ICD-10-CM | POA: Diagnosis not present

## 2018-10-23 DIAGNOSIS — E039 Hypothyroidism, unspecified: Secondary | ICD-10-CM | POA: Diagnosis not present

## 2018-10-23 DIAGNOSIS — M069 Rheumatoid arthritis, unspecified: Secondary | ICD-10-CM | POA: Diagnosis not present

## 2018-10-23 DIAGNOSIS — N186 End stage renal disease: Secondary | ICD-10-CM | POA: Diagnosis not present

## 2018-10-23 DIAGNOSIS — Z7409 Other reduced mobility: Secondary | ICD-10-CM | POA: Diagnosis not present

## 2018-10-23 DIAGNOSIS — D593 Hemolytic-uremic syndrome: Secondary | ICD-10-CM | POA: Diagnosis not present

## 2018-10-23 DIAGNOSIS — Z940 Kidney transplant status: Secondary | ICD-10-CM | POA: Diagnosis not present

## 2018-10-23 DIAGNOSIS — T8612 Kidney transplant failure: Secondary | ICD-10-CM | POA: Diagnosis not present

## 2018-10-24 DIAGNOSIS — I7300 Raynaud's syndrome without gangrene: Secondary | ICD-10-CM | POA: Diagnosis not present

## 2018-10-24 DIAGNOSIS — Z7409 Other reduced mobility: Secondary | ICD-10-CM | POA: Diagnosis not present

## 2018-10-24 DIAGNOSIS — R5381 Other malaise: Secondary | ICD-10-CM | POA: Diagnosis not present

## 2018-10-24 DIAGNOSIS — N186 End stage renal disease: Secondary | ICD-10-CM | POA: Diagnosis not present

## 2018-10-24 DIAGNOSIS — Z940 Kidney transplant status: Secondary | ICD-10-CM | POA: Diagnosis not present

## 2018-10-24 DIAGNOSIS — M069 Rheumatoid arthritis, unspecified: Secondary | ICD-10-CM | POA: Diagnosis not present

## 2018-10-24 DIAGNOSIS — I120 Hypertensive chronic kidney disease with stage 5 chronic kidney disease or end stage renal disease: Secondary | ICD-10-CM | POA: Diagnosis not present

## 2018-10-24 DIAGNOSIS — Z992 Dependence on renal dialysis: Secondary | ICD-10-CM | POA: Diagnosis not present

## 2018-10-25 DIAGNOSIS — Z7409 Other reduced mobility: Secondary | ICD-10-CM | POA: Diagnosis not present

## 2018-10-25 DIAGNOSIS — I7300 Raynaud's syndrome without gangrene: Secondary | ICD-10-CM | POA: Diagnosis not present

## 2018-10-25 DIAGNOSIS — Z992 Dependence on renal dialysis: Secondary | ICD-10-CM | POA: Diagnosis not present

## 2018-10-25 DIAGNOSIS — R5381 Other malaise: Secondary | ICD-10-CM | POA: Diagnosis not present

## 2018-10-25 DIAGNOSIS — Z940 Kidney transplant status: Secondary | ICD-10-CM | POA: Diagnosis not present

## 2018-10-25 DIAGNOSIS — I120 Hypertensive chronic kidney disease with stage 5 chronic kidney disease or end stage renal disease: Secondary | ICD-10-CM | POA: Diagnosis not present

## 2018-10-25 DIAGNOSIS — M069 Rheumatoid arthritis, unspecified: Secondary | ICD-10-CM | POA: Diagnosis not present

## 2018-10-25 DIAGNOSIS — N186 End stage renal disease: Secondary | ICD-10-CM | POA: Diagnosis not present

## 2018-10-27 DIAGNOSIS — Z992 Dependence on renal dialysis: Secondary | ICD-10-CM | POA: Diagnosis not present

## 2018-10-27 DIAGNOSIS — M311 Thrombotic microangiopathy: Secondary | ICD-10-CM | POA: Diagnosis not present

## 2018-10-27 DIAGNOSIS — N2581 Secondary hyperparathyroidism of renal origin: Secondary | ICD-10-CM | POA: Diagnosis not present

## 2018-10-27 DIAGNOSIS — D509 Iron deficiency anemia, unspecified: Secondary | ICD-10-CM | POA: Diagnosis not present

## 2018-10-27 DIAGNOSIS — D631 Anemia in chronic kidney disease: Secondary | ICD-10-CM | POA: Diagnosis not present

## 2018-10-27 DIAGNOSIS — N186 End stage renal disease: Secondary | ICD-10-CM | POA: Diagnosis not present

## 2018-10-27 MED ORDER — BISACODYL 10 MG RE SUPP
10.00 | RECTAL | Status: DC
Start: ? — End: 2018-10-27

## 2018-10-27 MED ORDER — DARBEPOETIN ALFA 25 MCG/0.42ML IJ SOSY
25.00 | PREFILLED_SYRINGE | INTRAMUSCULAR | Status: DC
Start: 2018-10-27 — End: 2018-10-27

## 2018-10-27 MED ORDER — METOPROLOL TARTRATE 25 MG PO TABS
25.00 | ORAL_TABLET | ORAL | Status: DC
Start: 2018-10-25 — End: 2018-10-27

## 2018-10-27 MED ORDER — PHENOL 1.4 % MT LIQD
1.00 | OROMUCOSAL | Status: DC
Start: ? — End: 2018-10-27

## 2018-10-27 MED ORDER — ONDANSETRON 4 MG PO TBDP
4.00 | ORAL_TABLET | ORAL | Status: DC
Start: ? — End: 2018-10-27

## 2018-10-27 MED ORDER — POLYETHYLENE GLYCOL 3350 17 G PO PACK
17.00 | PACK | ORAL | Status: DC
Start: 2018-10-26 — End: 2018-10-27

## 2018-10-27 MED ORDER — ACETAMINOPHEN 325 MG PO TABS
325.00 | ORAL_TABLET | ORAL | Status: DC
Start: ? — End: 2018-10-27

## 2018-10-27 MED ORDER — GENERIC EXTERNAL MEDICATION
100.00 | Status: DC
Start: 2018-10-25 — End: 2018-10-27

## 2018-10-27 MED ORDER — SULFAMETHOXAZOLE-TRIMETHOPRIM 400-80 MG PO TABS
1.00 | ORAL_TABLET | ORAL | Status: DC
Start: 2018-10-26 — End: 2018-10-27

## 2018-10-27 MED ORDER — PREDNISONE 5 MG PO TABS
10.00 | ORAL_TABLET | ORAL | Status: DC
Start: 2018-10-26 — End: 2018-10-27

## 2018-10-27 MED ORDER — SENNOSIDES-DOCUSATE SODIUM 8.6-50 MG PO TABS
2.00 | ORAL_TABLET | ORAL | Status: DC
Start: 2018-10-25 — End: 2018-10-27

## 2018-10-27 MED ORDER — PANTOPRAZOLE SODIUM 40 MG PO TBEC
40.00 | DELAYED_RELEASE_TABLET | ORAL | Status: DC
Start: 2018-10-26 — End: 2018-10-27

## 2018-10-27 MED ORDER — MAGNESIUM OXIDE 400 MG PO TABS
800.00 | ORAL_TABLET | ORAL | Status: DC
Start: 2018-10-25 — End: 2018-10-27

## 2018-10-27 MED ORDER — ASPIRIN EC 81 MG PO TBEC
81.00 | DELAYED_RELEASE_TABLET | ORAL | Status: DC
Start: 2018-10-26 — End: 2018-10-27

## 2018-10-27 MED ORDER — MELATONIN 3 MG PO TABS
3.00 | ORAL_TABLET | ORAL | Status: DC
Start: ? — End: 2018-10-27

## 2018-10-27 MED ORDER — FLUCONAZOLE 50 MG PO TABS
50.00 | ORAL_TABLET | ORAL | Status: DC
Start: 2018-10-26 — End: 2018-10-27

## 2018-10-27 MED ORDER — GENERIC EXTERNAL MEDICATION
1.00 | Status: DC
Start: 2018-10-26 — End: 2018-10-27

## 2018-10-27 MED ORDER — HYDROCODONE-ACETAMINOPHEN 5-325 MG PO TABS
1.00 | ORAL_TABLET | ORAL | Status: DC
Start: ? — End: 2018-10-27

## 2018-10-27 MED ORDER — VALGANCICLOVIR HCL 450 MG PO TABS
450.00 | ORAL_TABLET | ORAL | Status: DC
Start: 2018-10-26 — End: 2018-10-27

## 2018-10-27 MED ORDER — MYCOPHENOLATE SODIUM 360 MG PO TBEC
360.00 | DELAYED_RELEASE_TABLET | ORAL | Status: DC
Start: 2018-10-25 — End: 2018-10-27

## 2018-10-28 DIAGNOSIS — N186 End stage renal disease: Secondary | ICD-10-CM | POA: Diagnosis not present

## 2018-10-28 DIAGNOSIS — Z992 Dependence on renal dialysis: Secondary | ICD-10-CM | POA: Diagnosis not present

## 2018-10-28 DIAGNOSIS — N2581 Secondary hyperparathyroidism of renal origin: Secondary | ICD-10-CM | POA: Diagnosis not present

## 2018-10-30 ENCOUNTER — Observation Stay (HOSPITAL_COMMUNITY)
Admission: EM | Admit: 2018-10-30 | Discharge: 2018-10-31 | Disposition: A | Discharge status: Home / Self Care | Payer: Medicare Other | Attending: Internal Medicine | Admitting: Internal Medicine

## 2018-10-30 ENCOUNTER — Observation Stay (HOSPITAL_COMMUNITY): Payer: Medicare Other

## 2018-10-30 ENCOUNTER — Other Ambulatory Visit: Payer: Self-pay

## 2018-10-30 DIAGNOSIS — D649 Anemia, unspecified: Secondary | ICD-10-CM

## 2018-10-30 DIAGNOSIS — Z992 Dependence on renal dialysis: Secondary | ICD-10-CM | POA: Diagnosis not present

## 2018-10-30 DIAGNOSIS — N2581 Secondary hyperparathyroidism of renal origin: Secondary | ICD-10-CM | POA: Insufficient documentation

## 2018-10-30 DIAGNOSIS — Z8673 Personal history of transient ischemic attack (TIA), and cerebral infarction without residual deficits: Secondary | ICD-10-CM | POA: Insufficient documentation

## 2018-10-30 DIAGNOSIS — D631 Anemia in chronic kidney disease: Secondary | ICD-10-CM | POA: Diagnosis not present

## 2018-10-30 DIAGNOSIS — E871 Hypo-osmolality and hyponatremia: Secondary | ICD-10-CM | POA: Diagnosis not present

## 2018-10-30 DIAGNOSIS — Z8349 Family history of other endocrine, nutritional and metabolic diseases: Secondary | ICD-10-CM | POA: Diagnosis not present

## 2018-10-30 DIAGNOSIS — Z8249 Family history of ischemic heart disease and other diseases of the circulatory system: Secondary | ICD-10-CM | POA: Diagnosis not present

## 2018-10-30 DIAGNOSIS — R2681 Unsteadiness on feet: Secondary | ICD-10-CM | POA: Diagnosis not present

## 2018-10-30 DIAGNOSIS — Z79899 Other long term (current) drug therapy: Secondary | ICD-10-CM | POA: Insufficient documentation

## 2018-10-30 DIAGNOSIS — E8809 Other disorders of plasma-protein metabolism, not elsewhere classified: Secondary | ICD-10-CM | POA: Diagnosis not present

## 2018-10-30 DIAGNOSIS — I5032 Chronic diastolic (congestive) heart failure: Secondary | ICD-10-CM | POA: Insufficient documentation

## 2018-10-30 DIAGNOSIS — E872 Acidosis: Secondary | ICD-10-CM | POA: Diagnosis not present

## 2018-10-30 DIAGNOSIS — M069 Rheumatoid arthritis, unspecified: Secondary | ICD-10-CM | POA: Diagnosis not present

## 2018-10-30 DIAGNOSIS — E785 Hyperlipidemia, unspecified: Secondary | ICD-10-CM | POA: Diagnosis not present

## 2018-10-30 DIAGNOSIS — I132 Hypertensive heart and chronic kidney disease with heart failure and with stage 5 chronic kidney disease, or end stage renal disease: Secondary | ICD-10-CM | POA: Insufficient documentation

## 2018-10-30 DIAGNOSIS — I252 Old myocardial infarction: Secondary | ICD-10-CM | POA: Insufficient documentation

## 2018-10-30 DIAGNOSIS — D62 Acute posthemorrhagic anemia: Principal | ICD-10-CM | POA: Insufficient documentation

## 2018-10-30 DIAGNOSIS — Z7982 Long term (current) use of aspirin: Secondary | ICD-10-CM | POA: Diagnosis not present

## 2018-10-30 DIAGNOSIS — E8889 Other specified metabolic disorders: Secondary | ICD-10-CM | POA: Insufficient documentation

## 2018-10-30 DIAGNOSIS — I120 Hypertensive chronic kidney disease with stage 5 chronic kidney disease or end stage renal disease: Secondary | ICD-10-CM | POA: Diagnosis not present

## 2018-10-30 DIAGNOSIS — E875 Hyperkalemia: Secondary | ICD-10-CM | POA: Diagnosis not present

## 2018-10-30 DIAGNOSIS — N186 End stage renal disease: Secondary | ICD-10-CM | POA: Insufficient documentation

## 2018-10-30 DIAGNOSIS — M8580 Other specified disorders of bone density and structure, unspecified site: Secondary | ICD-10-CM | POA: Insufficient documentation

## 2018-10-30 DIAGNOSIS — D539 Nutritional anemia, unspecified: Secondary | ICD-10-CM | POA: Diagnosis present

## 2018-10-30 DIAGNOSIS — Z940 Kidney transplant status: Secondary | ICD-10-CM | POA: Insufficient documentation

## 2018-10-30 DIAGNOSIS — K219 Gastro-esophageal reflux disease without esophagitis: Secondary | ICD-10-CM | POA: Insufficient documentation

## 2018-10-30 DIAGNOSIS — Z20828 Contact with and (suspected) exposure to other viral communicable diseases: Secondary | ICD-10-CM | POA: Diagnosis not present

## 2018-10-30 DIAGNOSIS — I517 Cardiomegaly: Secondary | ICD-10-CM | POA: Diagnosis not present

## 2018-10-30 LAB — COMPREHENSIVE METABOLIC PANEL
ALT: 22 U/L (ref 0–44)
AST: 36 U/L (ref 15–41)
Albumin: 2.4 g/dL — ABNORMAL LOW (ref 3.5–5.0)
Alkaline Phosphatase: 61 U/L (ref 38–126)
Anion gap: 17 — ABNORMAL HIGH (ref 5–15)
BUN: 29 mg/dL — ABNORMAL HIGH (ref 8–23)
CO2: 20 mmol/L — ABNORMAL LOW (ref 22–32)
Calcium: 8.8 mg/dL — ABNORMAL LOW (ref 8.9–10.3)
Chloride: 94 mmol/L — ABNORMAL LOW (ref 98–111)
Creatinine, Ser: 4.79 mg/dL — ABNORMAL HIGH (ref 0.44–1.00)
GFR calc Af Amer: 10 mL/min — ABNORMAL LOW (ref >60)
GFR calc non Af Amer: 9 mL/min — ABNORMAL LOW (ref >60)
Glucose, Bld: 97 mg/dL (ref 70–99)
Potassium: 4.8 mmol/L (ref 3.5–5.1)
Sodium: 131 mmol/L — ABNORMAL LOW (ref 135–145)
Total Bilirubin: 0.6 mg/dL (ref 0.3–1.2)
Total Protein: 4.8 g/dL — ABNORMAL LOW (ref 6.5–8.1)

## 2018-10-30 LAB — CBC
HCT: 22 % — ABNORMAL LOW (ref 36.0–46.0)
Hemoglobin: 6.6 g/dL — CL (ref 12.0–15.0)
MCH: 30.8 pg (ref 26.0–34.0)
MCHC: 30 g/dL (ref 30.0–36.0)
MCV: 102.8 fL — ABNORMAL HIGH (ref 80.0–100.0)
Platelets: 250 10*3/uL (ref 150–400)
RBC: 2.14 MIL/uL — ABNORMAL LOW (ref 3.87–5.11)
RDW: 22 % — ABNORMAL HIGH (ref 11.5–15.5)
WBC: 10.1 10*3/uL (ref 4.0–10.5)
nRBC: 0 % (ref 0.0–0.2)

## 2018-10-30 LAB — PREPARE RBC (CROSSMATCH)

## 2018-10-30 LAB — SARS CORONAVIRUS 2 BY RT PCR (HOSPITAL ORDER, PERFORMED IN ~~LOC~~ HOSPITAL LAB): SARS Coronavirus 2: NEGATIVE

## 2018-10-30 MED ORDER — SULFAMETHOXAZOLE-TRIMETHOPRIM 400-80 MG PO TABS
1.0000 | ORAL_TABLET | ORAL | Status: DC
  Administered 2018-10-31: 1 via ORAL
  Filled 2018-10-30: qty 1

## 2018-10-30 MED ORDER — CALCITRIOL 0.5 MCG PO CAPS: 1.0000 ug | ORAL_CAPSULE | Freq: Every day | ORAL | Status: DC

## 2018-10-30 MED ORDER — ACETAMINOPHEN 325 MG PO TABS
650.0000 mg | ORAL_TABLET | Freq: Four times a day (QID) | ORAL | Status: DC | PRN
  Administered 2018-10-31 (×2): 650 mg via ORAL
  Filled 2018-10-30 (×2): qty 2

## 2018-10-30 MED ORDER — ATORVASTATIN CALCIUM 10 MG PO TABS: 10.0000 mg | ORAL_TABLET | ORAL | Status: DC

## 2018-10-30 MED ORDER — ASPIRIN EC 81 MG PO TBEC
81.0000 mg | DELAYED_RELEASE_TABLET | Freq: Every day | ORAL | Status: DC
  Administered 2018-10-31: 10:00:00 81 mg via ORAL
  Filled 2018-10-30: qty 1

## 2018-10-30 MED ORDER — SODIUM CHLORIDE 0.9% IV SOLUTION: Freq: Once | INTRAVENOUS | Status: DC

## 2018-10-30 MED ORDER — RAVULIZUMAB-CWVZ 300 MG/30ML IV SOLN: 300.0000 mg | INTRAVENOUS | Status: DC

## 2018-10-30 MED ORDER — FERRIC CITRATE 1 GM 210 MG(FE) PO TABS: 420.0000 mg | ORAL_TABLET | ORAL | Status: DC

## 2018-10-30 MED ORDER — SODIUM CHLORIDE 0.9 % IV SOLN: 10.0000 mL/h | Freq: Once | INTRAVENOUS | Status: DC

## 2018-10-30 MED ORDER — VALGANCICLOVIR HCL 450 MG PO TABS
450.0000 mg | ORAL_TABLET | ORAL | Status: DC
  Administered 2018-10-31: 450 mg via ORAL
  Filled 2018-10-30: qty 1

## 2018-10-30 MED ORDER — PANTOPRAZOLE SODIUM 40 MG PO TBEC
40.0000 mg | DELAYED_RELEASE_TABLET | Freq: Every day | ORAL | Status: DC
  Administered 2018-10-31: 10:00:00 40 mg via ORAL
  Filled 2018-10-30: qty 1

## 2018-10-30 MED ORDER — CHLORHEXIDINE GLUCONATE CLOTH 2 % EX PADS: 6.0000 | MEDICATED_PAD | Freq: Every day | CUTANEOUS | Status: DC

## 2018-10-30 MED ORDER — MYCOPHENOLATE SODIUM 180 MG PO TBEC
180.0000 mg | DELAYED_RELEASE_TABLET | Freq: Two times a day (BID) | ORAL | Status: DC
  Administered 2018-10-31 (×2): 180 mg via ORAL
  Filled 2018-10-30 (×3): qty 1

## 2018-10-30 MED ORDER — RENA-VITE PO TABS
1.0000 | ORAL_TABLET | Freq: Every day | ORAL | Status: DC
  Administered 2018-10-31: 04:00:00 1 via ORAL
  Filled 2018-10-30 (×2): qty 1

## 2018-10-30 MED ORDER — PREDNISONE 5 MG PO TABS
5.0000 mg | ORAL_TABLET | Freq: Every day | ORAL | Status: DC
  Administered 2018-10-31: 5 mg via ORAL
  Filled 2018-10-30: qty 1

## 2018-10-30 NOTE — ED Notes (Signed)
Pt takining po fluids

## 2018-10-30 NOTE — ED Notes (Signed)
Dr. Garnette Czech MD at bedside to see patient.

## 2018-10-30 NOTE — ED Notes (Addendum)
Iv attempted without success.Multiple bruises and quarter size wound with scab noted at right forearm. Multip;e old iv sites. Bruise noted at shunt site left arm.

## 2018-10-30 NOTE — ED Triage Notes (Signed)
Patient received call this morning from dialysis center stating she needed to go to ER to have blood transfusion d/t hemoglobin of 6.4. Patient states she recently had kidney transplant that failed and was in hospital for 3 weeks. Reports having dialysis on Friday and Saturday, c/o fluid overload, and has felt increasingly fatigued. Denies fevers/chills, shortness of breath, chest pain. Resp e/u, skin w/d.

## 2018-10-30 NOTE — ED Notes (Addendum)
PA made aware that IV team unable to get IV.

## 2018-10-30 NOTE — H&P (Addendum)
History and Physical  Erin Morgan [id number] DOB: [birth year] DOA: 10/30/2018  Referring physician: PA Marijean Bravo, EDP PCP: Aretta Nip, MD  Outpatient Specialists: Nephrology Patient coming from: Home  Chief Complaint: Low hemoglobin referred to the ED by her nephrologist  HPI: Erin Morgan is a 63 y.o. female with medical history significant for end-stage renal disease now on HD Monday Wednesday Friday status post failed recent renal transplant done at [facility] (09/2018), anemia of chronic disease, chronic diastolic CHF, NSTEMI, TIA, who presented to [facility] ED as recommended by her nephrologist due to drop in hemoglobin down to 6.4 at her last hemodialysis session on Saturday, which she received in addition to her normal schedule HD schedule because of volume overload.  Patient reports generalized weakness, dizziness on standing, fatigue in the last couple of days.  Not associated with chest pain or shortness of breath.  Denies any abdominal pain.  Denies any overt bleeding.  ED Course: In the ED, lab studies remarkable for hemoglobin 6.6.  1 unit PRBC ordered to be transfused by EDP.  Sodium 131 likely secondary to volume overload.  3+ pitting edema in lower extremities bilaterally and mild left JVD noted on exam.  No EKG or chest x-ray completed at the time of this visit.    Review of Systems: Review of systems as noted in the HPI. All other systems reviewed and are negative.   Past Medical History:  Diagnosis Date  . Anemia   . Constipation   . Demand ischemia ([place])    a. 05/2018 elevated troponin in the setting of atrial tachycardia.  Catheterization: Normal coronaries.  . Ectopic atrial tachycardia ([place])    a. 05/2018-managed with diltiazem.  Marland Kitchen ESRD (end stage renal disease) ([place])    dialysis - t/th/sa- [facility]  . Family history of adverse reaction to anesthesia    mom and sister has n/v  . GERD (gastroesophageal reflux disease)   . History of  blood transfusion   . Hypertension   . Muscle weakness   . Osteopenia   . PONV (postoperative nausea and vomiting)    Headache  . Rheumatoid arthritis ([place])   . Seasonal allergies   . Thoracic aortic aneurysm ([place]) 07/18/2017   4.0 cm ascending thoracic noted on CT 07/18/2017  . Wears glasses    Past Surgical History:  Procedure Laterality Date  . AV FISTULA PLACEMENT Left 11/18/2014   Procedure: BRACHIOCEPHALIC ARTERIOVENOUS (AV) FISTULA CREATION;  Surgeon: Conrad [place], MD;  Location: [facility];  Service: Vascular;  Laterality: Left;  . CARPAL TUNNEL RELEASE Right 04/05/2014   Procedure: RIGHT CARPAL TUNNEL RELEASE;  Surgeon: Daryll Brod, MD;  Location[facility];  Service: Orthopedics;  Laterality: Right;  ANESTHESIA:  IV REGIONAL FAB  . COLONOSCOPY    . LEFT HEART CATH AND CORONARY ANGIOGRAPHY N/A 05/26/2018   Procedure: LEFT HEART CATH AND CORONARY ANGIOGRAPHY;  Surgeon: Martinique, Peter M, MD;  Location: [facility];  Service: Cardiovascular;  Laterality: N/A;  . REVISON OF ARTERIOVENOUS FISTULA Left 10/01/2016   Procedure: REVISON OF LEFT ARM ARTERIOVENOUS FISTULA;  Surgeon: Angelia Mould, MD;  Location: [facility];  Service: Vascular;  Laterality: Left;  . REVISON OF ARTERIOVENOUS FISTULA Left [id number]   Procedure: PLICATION OF LEFT ARM  ARTERIOVENOUS FISTULA;  Surgeon: Angelia Mould, MD;  Location: [facility];  Service: Vascular;  Laterality: Left;  . TONSILLECTOMY    . TUMOR EXCISION  2000   right foot  Social History:  reports that she has never smoked. She has never used smokeless tobacco. She reports that she does not drink alcohol or use drugs.   No Known Allergies  Family History  Problem Relation Age of Onset  . Hypertension Mother   . Cervical cancer Mother   . Cancer Mother   . Hyperlipidemia Mother   . Hypertension Father   . Stroke Father   . Hyperlipidemia Father   . CAD Father        at age 73  . Valvular heart disease Father         TAVR at age 26  . Healthy Sister   . Healthy Brother   . Rheum arthritis Other       Prior to Admission medications   Medication Sig Start Date End Date Taking? Authorizing Provider  acetaminophen (TYLENOL) 500 MG tablet Take 1,000 mg by mouth every 8 (eight) hours as needed for headache.   Yes [provider]  aspirin EC 81 MG tablet Take 81 mg by mouth daily.   Yes [provider]  atorvastatin (LIPITOR) 10 MG tablet Take 10 mg by mouth daily.   Yes [provider]  omeprazole (PRILOSEC) 20 MG capsule Take 20 mg by mouth daily before breakfast.    Yes [provider]  ULTOMIRIS 300 MG/30ML SOLN injection Inject 30 mLs into the vein every 8 (eight) weeks. 05/09/18  Yes [provider]  calcitRIOL (ROCALTROL) 0.5 MCG capsule Take 1 mcg by mouth daily. 05/12/18   [provider]  diltiazem (CARDIZEM CD) 180 MG 24 hr capsule Take 1 capsule (180 mg total) by mouth daily. Patient not taking: Reported on 10/30/2018 05/29/18   Theora Gianotti, NP  ferric citrate (AURYXIA) 1 GM 210 MG(Fe) tablet Take 420 mg by mouth See admin instructions. Take 2 tablets (420 mg) by mouth with each meal & 2 tablet (210 mg) by mouth with each protein snack    [provider]  multivitamin (RENA-VIT) TABS tablet Take 1 tablet by mouth at bedtime. 05/28/18   Theora Gianotti, NP    Physical Exam: BP 113/65   Pulse 68   Temp 98.2 F (36.8 C) (Oral)   Resp 18   SpO2 100%   . General: 63 y.o. year-old female well developed well nourished in no acute distress.  Alert and oriented x4. . Cardiovascular: Regular rate and rhythm with no rubs or gallops.  No thyromegaly or JVD noted.  3+ pitting edema lower extremity bilaterally. 2/4 pulses in all 4 extremities. Marland Kitchen Respiratory: Clear to auscultation with no wheezes or rales. Good inspiratory effort. . Abdomen: Soft nontender nondistended with normal bowel sounds x4 quadrants. .  Muskuloskeletal: No cyanosis, clubbing.  3+ pitting edema noted bilaterally . Neuro: CN II-XII intact, strength, sensation, reflexes . Skin: No ulcerative lesions noted or rashes . Psychiatry: Judgement and insight appear normal. Mood is appropriate for condition and setting          Labs on Admission:  Basic Metabolic Panel: Recent Labs  Lab 10/30/18 1030  NA 131*  K 4.8  CL 94*  CO2 20*  GLUCOSE 97  BUN 29*  CREATININE 4.79*  CALCIUM 8.8*   Liver Function Tests: Recent Labs  Lab 10/30/18 1030  AST 36  ALT 22  ALKPHOS 61  BILITOT 0.6  PROT 4.8*  ALBUMIN 2.4*   No results for input(s): LIPASE, AMYLASE in the last 168 hours. No results for input(s): AMMONIA in the last  168 hours. CBC: Recent Labs  Lab 10/30/18 1030  WBC 10.1  HGB 6.6*  HCT 22.0*  MCV 102.8*  PLT 250   Cardiac Enzymes: No results for input(s): CKTOTAL, CKMB, CKMBINDEX, TROPONINI in the last 168 hours.  BNP (last 3 results) Recent Labs    05/26/18 1047  BNP >4,500.0*    ProBNP (last 3 results) No results for input(s): PROBNP in the last 8760 hours.  CBG: No results for input(s): GLUCAP in the last 168 hours.  Radiological Exams on Admission: No results found.  EKG: I independently viewed the EKG done and my findings are as followed: None available at the time of this visit.  Assessment/Plan Present on Admission: . Symptomatic anemia  Active Problems:   Symptomatic anemia  Acute blood loss/symptomatic anemia in the setting of ESRD with recent failed renal transplant Suspect anemia of chronic disease in the setting of ESRD Hemoglobin 6.6 on presentation Baseline hemoglobin 10 (05/27/18) FOBT pending 1 unit PRBC ordered to be transfused by EDP No peripheral IV access at the time of this dictation Bedside RN will reattempt or contact IV team Obtain iron studies Obtain twelve-lead EKG Closely monitor on telemetry Resume immunosuppressants for renal transplant Resume home iron  supplement No sign of active infective process  Volume overload in the setting of ESRD on HD Monday Wednesday Friday and hypoalbuminemia Sodium 131 on presentation; albumin 2.4. Volume overload on exam with 3+ pitting edema in lower extremities bilaterally and mild left JVD present Chest x-ray is pending Nephrology has been contacted by EDP Last hemodialysis was on Tuesday Start renal hemodialysis diet Closely monitor electrolytes and blood pressure  High anion gap metabolic acidosis likely in the setting of ESRD Presented with chemistry bicarb of 20 and anion gap of 17 Nephrology consulted by EDP Repeat BMP in the morning  Moderate hypoalbuminemia Albumin 2.4 on presentation LFTs normal Dietary consult  ESRD/recent renal transplant Management as stated above  Chronic diastolic CHF Last 2D echo done on 05/26/2018 showed normal LVEF Volume status managed with hemodialysis  Dizziness, possibly secondary to symptomatic anemia versus orthostatic hypotension Reports dizziness upon standing Obtain orthostatic vital signs Fall precautions PT OT to assess once stable  Hyperlipidemia Resume Lipitor  GERD Resume omeprazole  Essential hypertension Blood pressure soft Hold off diltiazem for now     DVT prophylaxis: SCDs  Code Status: Full code.  Family Communication: Mother at bedside.  Disposition Plan: Admit to telemetry unit  Consults called: Nephrology consulted by EDP  Admission status: Observation status    Kayleen Memos MD [facility] Pager [phone]  If 7PM-7AM, please contact night-coverage [email or web address] Password [facility]  10/30/2018, 5:38 PM

## 2018-10-30 NOTE — ED Notes (Signed)
Patient remains in dialysis. This RN up to dialysis to verify blood transfusion with Haywood Lasso, RN at this time.

## 2018-10-30 NOTE — ED Provider Notes (Signed)
[facility] EMERGENCY DEPARTMENT Provider Note   CSN: [id number] Arrival date & time: 10/30/18  1005     History   Chief Complaint Chief Complaint  Patient presents with   Abnormal Lab   Fluid Overload    HPI Erin Morgan is a 63 y.o. female.     Erin Morgan is a 63 y.o. female with a history of ESRD on HD after recently failed renal transplant, chronic anemia, atrial tachycardia, hypertension, rheumatoid arthritis, thoracic aortic aneurysm, who presents to the ED for evaluation of low hemoglobin.  Patient reports she was called by her nephrologist after labs came back from dialysis on Saturday and told that she had a hemoglobin of 6.4.  Patient reports that she has long history of anemia has required transfusions in the past although it has been a few years.  Reports that when her hemoglobin drops it often drops precipitously, she typically receives EPO with dialysis.  Patient reports that 3 weeks ago at [facility] she had a kidney transplant but there were immediate complications in the kidney was never functional, she has been on hemodialysis since the transplant, was previously on peritoneal dialysis.  Reports with anemia she has had persistent and worsening weakness and fatigue but denies other focal symptoms.  No chest pain or shortness of breath.  She has not had any fevers or chills.  No cough.  Denies any abdominal pain vomiting or diarrhea.  She has not had any bloody or dark stools.  Reports she is still on immunosuppressants after her transplant.  No other aggravating or alleviating factors.      Past Medical History:  Diagnosis Date   Anemia    Constipation    Demand ischemia ([place])    a. 05/2018 elevated troponin in the setting of atrial tachycardia.  Catheterization: Normal coronaries.   Ectopic atrial tachycardia ([place])    a. 05/2018-managed with diltiazem.   ESRD (end stage renal disease) ([place])    dialysis - t/th/sa- [facility]   Family history of adverse reaction to anesthesia    mom and sister has n/v   GERD (gastroesophageal reflux disease)    History of blood transfusion    Hypertension    Muscle weakness    Osteopenia    PONV (postoperative nausea and vomiting)    Headache   Rheumatoid arthritis (HCC)    Seasonal allergies    Thoracic aortic aneurysm ([place]) 07/18/2017   4.0 cm ascending thoracic noted on CT 07/18/2017   Wears glasses     Patient Active Problem List   Diagnosis Date Noted   TIA (transient ischemic attack) 08/15/2018   Demand ischemia ([place]) 05/28/2018   Ectopic atrial tachycardia ([place]) 05/28/2018   Hypokalemia 05/28/2018   Hypomagnesemia 05/28/2018   Prolonged QT interval 05/28/2018   NSTEMI (non-ST elevated myocardial infarction) ([place]) 05/26/2018   Weakness 01/12/2017   Symptomatic anemia 07/26/2015   GERD (gastroesophageal reflux disease) 07/26/2015   ESRD (end stage renal disease) ([place])    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[place]) 08/09/2014   Elevated troponin 07/18/2014   Headache disorder    Rheumatoid arthritis ([place]) 07/17/2014   Hypertensive urgency 07/17/2014   ESRD on dialysis ([place]) 07/17/2014   Hyponatremia 07/17/2014   Essential hypertension     Past Surgical History:  Procedure Laterality Date   AV FISTULA PLACEMENT Left 11/18/2014   Procedure: BRACHIOCEPHALIC ARTERIOVENOUS (AV) FISTULA CREATION;  Surgeon: Conrad Santel, MD;  Location:  [facility];  Service: Vascular;  Laterality: Left;   CARPAL TUNNEL RELEASE Right 04/05/2014   Procedure: RIGHT CARPAL TUNNEL RELEASE;  Surgeon: Daryll Brod, MD;  Location: [facility];  Service: Orthopedics;  Laterality: Right;  ANESTHESIA:  IV REGIONAL FAB   COLONOSCOPY     LEFT HEART CATH AND CORONARY ANGIOGRAPHY N/A 05/26/2018   Procedure: LEFT HEART CATH AND CORONARY ANGIOGRAPHY;  Surgeon: Martinique, Peter M, MD;  Location: [facility];  Service: Cardiovascular;  Laterality: N/A;   REVISON OF ARTERIOVENOUS FISTULA Left 10/01/2016   Procedure: REVISON OF LEFT ARM ARTERIOVENOUS FISTULA;  Surgeon: Angelia Mould, MD;  Location: [facility];  Service: Vascular;  Laterality: Left;   REVISON OF ARTERIOVENOUS FISTULA Left [id number]   Procedure: PLICATION OF LEFT ARM  ARTERIOVENOUS FISTULA;  Surgeon: Angelia Mould, MD;  Location: [facility];  Service: Vascular;  Laterality: Left;   TONSILLECTOMY     TUMOR EXCISION  2000   right foot     OB History   No obstetric history on file.      Home Medications    Prior to Admission medications   Medication Sig Start Date End Date Taking? Authorizing Provider  acetaminophen (TYLENOL) 500 MG tablet Take 1,000 mg by mouth every 8 (eight) hours as needed for headache.    [provider]  aspirin EC 81 MG tablet Take 81 mg by mouth daily.    [provider]  calcitRIOL (ROCALTROL) 0.5 MCG capsule Take 1 mcg by mouth daily. 05/12/18   [provider]  diltiazem (CARDIZEM CD) 180 MG 24 hr capsule Take 1 capsule (180 mg total) by mouth daily. 05/29/18   Theora Gianotti, NP  ferric citrate (AURYXIA) 1 GM 210 MG(Fe) tablet Take 420 mg by mouth See admin instructions. Take 2 tablets (420 mg) by mouth with each meal & 2 tablet (210 mg) by mouth with each protein snack    [provider]  fluocinonide (LIDEX) 0.05 % external solution Apply 1 application topically at bedtime. [place] 09/06/16   [provider]  gentamicin cream (GARAMYCIN) 0.1 % Apply 1 application topically daily. For dialysis catheter    [provider]  multivitamin (RENA-VIT) TABS tablet Take 1 tablet by mouth at bedtime. 05/28/18   Theora Gianotti, NP  omeprazole (PRILOSEC) 20 MG capsule Take 20 mg by mouth 2 (two) times daily.     [provider]  ULTOMIRIS 300 MG/30ML SOLN injection Inject 30 mLs into the vein every  8 (eight) weeks. 05/09/18   [provider]    Family History Family History  Problem Relation Age of Onset   Hypertension Mother    Cervical cancer Mother    Cancer Mother    Hyperlipidemia Mother    Hypertension Father    Stroke Father    Hyperlipidemia Father    CAD Father        at age 82   Valvular heart disease Father        TAVR at age 31   Healthy Sister    Healthy Brother    Rheum arthritis Other     Social History Social History   Tobacco Use   Smoking status: Never Smoker   Smokeless tobacco: Never Used  Substance Use Topics   Alcohol use: No   Drug use: No     Allergies   Patient has no known allergies.   Review of Systems Review of Systems  Constitutional: Positive  for fatigue. Negative for chills and fever.  Respiratory: Negative for cough and shortness of breath.   Cardiovascular: Positive for leg swelling. Negative for chest pain and palpitations.  Gastrointestinal: Negative for abdominal pain, nausea and vomiting.  Genitourinary: Negative for dysuria and frequency.  Musculoskeletal: Negative for arthralgias and myalgias.  Skin: Positive for color change and wound.  Neurological: Positive for weakness (Generalized). Negative for dizziness, syncope, light-headedness and numbness.     Physical Exam Updated Vital Signs BP 113/69 (BP Location: Right Arm)    Pulse 68    Temp 98.2 F (36.8 C) (Oral)    Resp 18    SpO2 100%   Physical Exam Vitals signs and nursing note reviewed.  Constitutional:      General: She is not in acute distress.    Appearance: She is well-developed. She is not diaphoretic.  HENT:     Head: Normocephalic and atraumatic.  Eyes:     General:        Right eye: No discharge.        Left eye: No discharge.     Pupils: Pupils are equal, round, and reactive to light.     Comments: Conjunctivae pale  Neck:     Musculoskeletal: Neck supple.  Cardiovascular:     Rate and Rhythm: Normal rate and  regular rhythm.     Heart sounds: Normal heart sounds.  Pulmonary:     Effort: Pulmonary effort is normal. No respiratory distress.     Breath sounds: Normal breath sounds. No wheezing or rales.     Comments: Respirations equal and unlabored, patient able to speak in full sentences, lungs clear to auscultation bilaterally Abdominal:     General: Bowel sounds are normal. There is no distension.     Palpations: Abdomen is soft. There is no mass.     Tenderness: There is no abdominal tenderness. There is no guarding.     Comments: Abdomen soft, nondistended, nontender to palpation in all quadrants without guarding or peritoneal signs, well-healing surgical scars from recent kidney transplant  Musculoskeletal:        General: No deformity.     Right lower leg: Edema present.     Left lower leg: Edema present.     Comments: 3+ pitting edema to bilateral lower extremities up to the shin.  Extremities warm and well perfused.  Skin:    General: Skin is warm and dry.     Capillary Refill: Capillary refill takes less than 2 seconds.  Neurological:     Mental Status: She is alert and oriented to person, place, and time.     Coordination: Coordination normal.     Comments: Speech is clear, able to follow commands Moves extremities without ataxia, coordination intact  Psychiatric:        Mood and Affect: Mood normal.        Behavior: Behavior normal.      ED Treatments / Results  Labs (all labs ordered are listed, but only abnormal results are displayed) Labs Reviewed  COMPREHENSIVE METABOLIC PANEL - Abnormal; Notable for the following components:      Result Value   Sodium 131 (*)    Chloride 94 (*)    CO2 20 (*)    BUN 29 (*)    Creatinine, Ser 4.79 (*)    Calcium 8.8 (*)    Total Protein 4.8 (*)    Albumin 2.4 (*)    GFR calc non Af Amer 9 (*)  GFR calc Af Amer 10 (*)    Anion gap 17 (*)    All other components within normal limits  CBC - Abnormal; Notable for the following  components:   RBC 2.14 (*)    Hemoglobin 6.6 (*)    HCT 22.0 (*)    MCV 102.8 (*)    RDW 22.0 (*)    All other components within normal limits  SARS CORONAVIRUS 2 BY RT PCR (HOSPITAL ORDER, [facility])  CBC  BASIC METABOLIC PANEL  IRON AND TIBC  FERRITIN  FOLATE RBC  VITAMIN B12  RETICULOCYTES  POC OCCULT BLOOD, ED  TYPE AND SCREEN  PREPARE RBC (CROSSMATCH)    EKG EKG Interpretation  Date/Time:  Monday October 30 2018 19:52:30 EDT Ventricular Rate:  70 PR Interval:    QRS Duration: 123 QT Interval:  413 QTC Calculation: 446 R Axis:   -45 Text Interpretation: Sinus rhythm Borderline prolonged PR interval Nonspecific IVCD with LAD LVH with secondary repolarization abnormality Anterior infarct, old When comapred to prior, similar ST changes to prior, more t wave inversion in lead V6. No STEMI Confirmed by Antony Blackbird [phone]) on 10/30/2018 7:55:00 PM   Radiology Dg Chest Port 1 View  Result Date: 10/30/2018 CLINICAL DATA:  Fluid overload EXAM: PORTABLE CHEST 1 VIEW COMPARISON:  June 08, 2017 FINDINGS: The heart size is mildly enlarged. There is no pulmonary edema or vascular congestion. No pneumothorax. No acute osseous abnormality. No significant pleural effusion or atelectasis. IMPRESSION: Mild cardiomegaly without evidence for pulmonary edema or significant volume overload. Electronically Signed   By: Constance Holster M.D.   On: 10/30/2018 17:44    Procedures .Critical Care Performed by: Jacqlyn Larsen, PA-C Authorized by: Jacqlyn Larsen, PA-C   Critical care provider statement:    Critical care time (minutes):  45   Critical care was necessary to treat or prevent imminent or life-threatening deterioration of the following conditions:  Circulatory failure (Symptomatic anemia requiring transfusion)   Critical care was time spent personally by me on the following activities:  Discussions with consultants, evaluation of patient's response to  treatment, examination of patient, ordering and performing treatments and interventions, ordering and review of laboratory studies, ordering and review of radiographic studies, pulse oximetry, re-evaluation of patient's condition, obtaining history from patient or surrogate and review of old charts   (including critical care time)  Medications Ordered in ED Medications  0.9 %  sodium chloride infusion (has no administration in time range)  Chlorhexidine Gluconate Cloth 2 % PADS 6 each (has no administration in time range)  aspirin EC tablet 81 mg (has no administration in time range)  atorvastatin (LIPITOR) tablet 10 mg (has no administration in time range)  calcitRIOL (ROCALTROL) capsule 1 mcg (has no administration in time range)  ferric citrate (AURYXIA) tablet 420 mg (has no administration in time range)  multivitamin (RENA-VIT) tablet 1 tablet (has no administration in time range)  pantoprazole (PROTONIX) EC tablet 40 mg (has no administration in time range)  ravulizumab-cwvz (ULTOMIRIS) injection 300 mg (has no administration in time range)  acetaminophen (TYLENOL) tablet 650 mg (has no administration in time range)  mycophenolate (MYFORTIC) EC tablet 180 mg (has no administration in time range)  predniSONE (DELTASONE) tablet 5 mg (has no administration in time range)  sulfamethoxazole-trimethoprim (BACTRIM) 400-80 MG per tablet 1 tablet (has no administration in time range)  valGANciclovir (VALCYTE) 450 MG tablet TABS 450 mg (has no administration in time range)  Initial Impression / Assessment and Plan / ED Course  I have reviewed the triage vital signs and the nursing notes.  Pertinent labs & imaging results that were available during my care of the patient were reviewed by me and considered in my medical decision making (see chart for details).  63 year old female with ESRD on hemodialysis, presenting with low hemoglobin, hemoglobin of 6.6 on arrival, patient denies any  bleeding symptoms, has not had blood in her stools.  Has had chronic anemia and required transfusions in the past.  Had recently failed renal transplant and is still on immunosuppressants but is not having any infectious symptoms, no fevers, vitals normal on arrival.  Patient has had issues with worsening lower extremity edema and fluid overload.  Had back-to-back dialysis sessions on Friday and Saturday with some improvement and has no shortness of breath or difficulty breathing but continues to have 3+ edema in the lower extremities.  Concern for third spacing.    Hemoglobin of 6.6 with no gross blood on Hemoccult.  Creatinine of 4.79, BUN of 29.  Normal potassium, sodium of 131.  Type and screen ordered.  Will order 1 unit blood transfusion, anticipate patient will require admission as she reports that when her hemoglobin drops it often worsens quickly and she may require additional transfusions.  Will consult for medicine admission and will consult nephrology.  Chest x-ray without evidence of pulmonary edema or fluid overload.  Case discussed with Dr. Francia Greaves with [facility] hospitalist who will see and admit the patient.  Case discussed with Dr. Marval Regal with nephrology who will see patient in consult and arrange dialysis.  Difficulty gaining IV access for the patient, nephrologist reports that patient can receive transfusion during dialysis.   Final Clinical Impressions(s) / ED Diagnoses   Final diagnoses:  Symptomatic anemia  ESRD on hemodialysis [facility])    ED Discharge Orders    None       Janet Berlin 10/30/18 2118    Fredia Sorrow, MD 11/09/18 [phone]

## 2018-10-30 NOTE — Progress Notes (Signed)
Rt arm assessed with ultrasound for PIV.  Unable to locate suitable vein.  Veins seen are non compressible and unusable.  Attempt small vein on surface which would not thread.

## 2018-10-30 NOTE — ED Notes (Signed)
Patient transported to HD at this time.

## 2018-10-30 NOTE — ED Notes (Signed)
Pt noted as hving 3+ edema to bilateral feet and ankles.

## 2018-10-30 NOTE — Consult Note (Signed)
[facility] Renal Consultation Note    Indication for Consultation:  Management of ESRD/hemodialysis; anemia, hypertension/volume and secondary hyperparathyroidism  HPI: Erin Morgan is a 63 y.o. female.  Ms. 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  Unfortunately she soon lost the transplant due to occlusion of the transplanted renal artery and was restarted on HD 10/24/18.  She had her first outpatient HD on Friday 10/27/18 and again on 10/28/18 due to volume overload.  She was noted to have significant anemia and was instructed by the dialysis unit to go to the ED for a blood transfusion after her Hgb dropped to 6.6.  We have been consulted to provide HD and manage her ESRD related disease processes during her admission.  She has not peripheral IV access so will plan for blood transfusion on HD tonight.  She has noted fatigue and weakness as well as significant edema of her legs and arms.  She denies any sob, cp, productive cough, fevers, chills, N/V/D, no hematochezia, melena, or BRBPR.  Past Medical History:  Diagnosis Date  . Anemia   . Constipation   . Demand ischemia ([place])    a. 05/2018 elevated troponin in the setting of atrial tachycardia.  Catheterization: Normal coronaries.  . Ectopic atrial tachycardia ([place])    a. 05/2018-managed with diltiazem.  Marland Kitchen ESRD (end stage renal disease) ([place])    dialysis - t/th/sa- [facility]  . Family history of adverse reaction to anesthesia    mom and sister has n/v  . GERD (gastroesophageal reflux disease)   . History of blood transfusion   . Hypertension   . Muscle weakness   . Osteopenia   . PONV (postoperative nausea and vomiting)    Headache  . Rheumatoid arthritis ([place])   . Seasonal allergies   . Thoracic aortic aneurysm ([place]) 07/18/2017   4.0 cm ascending thoracic noted on CT 07/18/2017  . Wears glasses     Past Surgical History:  Procedure Laterality Date  . AV FISTULA PLACEMENT Left 11/18/2014   Procedure: BRACHIOCEPHALIC ARTERIOVENOUS (AV) FISTULA CREATION;  Surgeon: Conrad Coupland, MD;  Location: [facility];  Service: Vascular;  Laterality: Left;  . CARPAL TUNNEL RELEASE Right 04/05/2014   Procedure: RIGHT CARPAL TUNNEL RELEASE;  Surgeon: Daryll Brod, MD;  Location: [facility];  Service: Orthopedics;  Laterality: Right;  ANESTHESIA:  IV REGIONAL FAB  . COLONOSCOPY    . LEFT HEART CATH AND CORONARY ANGIOGRAPHY N/A 05/26/2018   Procedure: LEFT HEART CATH AND CORONARY ANGIOGRAPHY;  Surgeon: Martinique, Peter M, MD;  Location: [facility];  Service: Cardiovascular;  Laterality: N/A;  . REVISON OF ARTERIOVENOUS FISTULA Left 10/01/2016   Procedure: REVISON OF LEFT ARM ARTERIOVENOUS FISTULA;  Surgeon: Angelia Mould, MD;  Location: [facility];  Service: Vascular;  Laterality: Left;  . REVISON OF ARTERIOVENOUS FISTULA Left [id number]   Procedure: PLICATION OF LEFT ARM  ARTERIOVENOUS FISTULA;  Surgeon: Angelia Mould, MD;  Location: [facility];  Service: Vascular;  Laterality: Left;  . TONSILLECTOMY    . TUMOR EXCISION  2000   right foot   Family History:   Family History  Problem Relation Age of Onset  . Hypertension Mother   . Cervical cancer Mother   . Cancer Mother   . Hyperlipidemia Mother   . Hypertension Father   . Stroke Father   . Hyperlipidemia  Father   . CAD Father        at age 78  . Valvular heart disease Father        TAVR at age 77  . Healthy Sister   . Healthy Brother   . Rheum arthritis Other    Social History:  reports that she has never smoked. She has never used smokeless tobacco. She reports that she does not drink alcohol or use drugs. No Known Allergies Prior to Admission medications   Medication Sig Start Date End Date Taking? Authorizing Provider  acetaminophen (TYLENOL) 500 MG tablet Take 1,000 mg by mouth every 8 (eight) hours as needed for  headache.   Yes [provider]  aspirin EC 81 MG tablet Take 81 mg by mouth daily.   Yes [provider]  atorvastatin (LIPITOR) 10 MG tablet Take 10 mg by mouth daily.   Yes [provider]  omeprazole (PRILOSEC) 20 MG capsule Take 20 mg by mouth daily before breakfast.    Yes [provider]  ULTOMIRIS 300 MG/30ML SOLN injection Inject 30 mLs into the vein every 8 (eight) weeks. 05/09/18  Yes [provider]  calcitRIOL (ROCALTROL) 0.5 MCG capsule Take 1 mcg by mouth daily. 05/12/18   [provider]  diltiazem (CARDIZEM CD) 180 MG 24 hr capsule Take 1 capsule (180 mg total) by mouth daily. Patient not taking: Reported on 10/30/2018 05/29/18   Theora Gianotti, NP  ferric citrate (AURYXIA) 1 GM 210 MG(Fe) tablet Take 420 mg by mouth See admin instructions. Take 2 tablets (420 mg) by mouth with each meal & 2 tablet (210 mg) by mouth with each protein snack    [provider]  multivitamin (RENA-VIT) TABS tablet Take 1 tablet by mouth at bedtime. 05/28/18   Theora Gianotti, NP   Current Facility-Administered Medications  Medication Dose Route Frequency Provider Last Rate Last Dose  . 0.9 %  sodium chloride infusion  10 mL/hr Intravenous Once Ford, Kelsey N, PA-C      . acetaminophen (TYLENOL) tablet 650 mg  650 mg Oral Q6H PRN Kayleen Memos, DO      . [START ON 10/31/2018] aspirin EC tablet 81 mg  81 mg Oral Daily Hall, Carole N, DO      . atorvastatin (LIPITOR) tablet 10 mg  10 mg Oral Daily Hall, Carole N, DO      . calcitRIOL (ROCALTROL) capsule 1 mcg  1 mcg Oral Daily Irene Pap N, DO      . [START ON 10/31/2018] Chlorhexidine Gluconate Cloth 2 % PADS 6 each  6 each Topical Q0600 Donato Heinz, MD      . ferric citrate (AURYXIA) tablet 420 mg  420 mg Oral See admin instructions Kayleen Memos, DO      . multivitamin (RENA-VIT) tablet 1 tablet  1 tablet Oral QHS Kayleen Memos, DO      . [START ON 10/31/2018]  pantoprazole (PROTONIX) EC tablet 40 mg  40 mg Oral Daily Hall, Carole N, DO      . ravulizumab-cwvz (ULTOMIRIS) injection 300 mg  300 mg Intravenous Q8 Weeks Hall, Carole N, DO       Current Outpatient Medications  Medication Sig Dispense Refill  . acetaminophen (TYLENOL) 500 MG tablet Take 1,000 mg by mouth every 8 (eight) hours as needed for headache.    Marland Kitchen aspirin EC 81 MG tablet Take 81 mg by mouth daily.    Marland Kitchen atorvastatin (LIPITOR) 10 MG tablet  Take 10 mg by mouth daily.    Marland Kitchen omeprazole (PRILOSEC) 20 MG capsule Take 20 mg by mouth daily before breakfast.     . ULTOMIRIS 300 MG/30ML SOLN injection Inject 30 mLs into the vein every 8 (eight) weeks.    . calcitRIOL (ROCALTROL) 0.5 MCG capsule Take 1 mcg by mouth daily.    Marland Kitchen diltiazem (CARDIZEM CD) 180 MG 24 hr capsule Take 1 capsule (180 mg total) by mouth daily. (Patient not taking: Reported on 10/30/2018) 30 capsule 6  . ferric citrate (AURYXIA) 1 GM 210 MG(Fe) tablet Take 420 mg by mouth See admin instructions. Take 2 tablets (420 mg) by mouth with each meal & 2 tablet (210 mg) by mouth with each protein snack    . multivitamin (RENA-VIT) TABS tablet Take 1 tablet by mouth at bedtime. 30 tablet 3   Labs: Basic Metabolic Panel: Recent Labs  Lab 10/30/18 1030  NA 131*  K 4.8  CL 94*  CO2 20*  GLUCOSE 97  BUN 29*  CREATININE 4.79*  CALCIUM 8.8*   Liver Function Tests: Recent Labs  Lab 10/30/18 1030  AST 36  ALT 22  ALKPHOS 61  BILITOT 0.6  PROT 4.8*  ALBUMIN 2.4*   No results for input(s): LIPASE, AMYLASE in the last 168 hours. No results for input(s): AMMONIA in the last 168 hours. CBC: Recent Labs  Lab 10/30/18 1030  WBC 10.1  HGB 6.6*  HCT 22.0*  MCV 102.8*  PLT 250   Cardiac Enzymes: No results for input(s): CKTOTAL, CKMB, CKMBINDEX, TROPONINI in the last 168 hours. CBG: No results for input(s): GLUCAP in the last 168 hours. Iron Studies: No results for input(s): IRON, TIBC, TRANSFERRIN, FERRITIN in the  last 72 hours. Studies/Results: Dg Chest Port 1 View  Result Date: 10/30/2018 CLINICAL DATA:  Fluid overload EXAM: PORTABLE CHEST 1 VIEW COMPARISON:  June 08, 2017 FINDINGS: The heart size is mildly enlarged. There is no pulmonary edema or vascular congestion. No pneumothorax. No acute osseous abnormality. No significant pleural effusion or atelectasis. IMPRESSION: Mild cardiomegaly without evidence for pulmonary edema or significant volume overload. Electronically Signed   By: Constance Holster M.D.   On: 10/30/2018 17:44    ROS: Pertinent items are noted in HPI. Physical Exam: Vitals:   10/30/18 1715 10/30/18 1730 10/30/18 1800 10/30/18 1815  BP: 126/71 130/73 126/73 122/67  Pulse: 67 66 66 67  Resp: 19 13 16 15   Temp:      TempSrc:      SpO2: 100% 100% 100% 100%      Weight change:  No intake or output data in the 24 hours ending 10/30/18 1832 BP 122/67   Pulse 67   Temp 98.2 F (36.8 C) (Oral)   Resp 15   SpO2 100%  General appearance: alert, cooperative, no distress and pale Head: Normocephalic, without obvious abnormality, atraumatic Resp: clear to auscultation bilaterally Cardio: regular rate and rhythm and no rub GI: soft, non-tender; bowel sounds normal; no masses,  no organomegaly Extremities: edema 3+ anasarca of legs and arms and LUE AVF +T/B Dialysis Access: Left AVF  Dialysis Orders: Center:  [facility]  on MWF . EDW 51kg HD Bath 3K/2.25 Ca  Time 4 hrs Heparin none. Access  lavf BFR 400 DFR 800    Hectoral 2 mcg IV/HD Micera 225 mcg IV every 2 weeks   Assessment/Plan: 1.  Anemia- plan for transfusion of 2 units PRBC's while on HD 2.  ESRD -  Restarted HD earlier  this month  3.  Hypertension/volume  - +lower ext edema/anasarca presumably due to hypoalbuminemia and 3rd spacing.  Plan for HD and continue to challenge edw 4.  Vascular access: LAVF 5.  Metabolic bone disease -   Cont renal diet and follow ca/phos on auryxia. 6.  Nutrition - renal  diet. 7. Hyponatremia- due to volume overload.  Follow after HD and UF.  Donetta Potts, MD [place] Pager [phone] 10/30/2018, 6:32 PM

## 2018-10-30 NOTE — ED Notes (Signed)
Unable to obtain iv.

## 2018-10-30 NOTE — ED Notes (Signed)
Dr. Gustavus Messing at bedside

## 2018-10-31 DIAGNOSIS — N186 End stage renal disease: Secondary | ICD-10-CM | POA: Diagnosis not present

## 2018-10-31 DIAGNOSIS — D62 Acute posthemorrhagic anemia: Secondary | ICD-10-CM | POA: Diagnosis not present

## 2018-10-31 DIAGNOSIS — D649 Anemia, unspecified: Secondary | ICD-10-CM | POA: Diagnosis not present

## 2018-10-31 DIAGNOSIS — Z992 Dependence on renal dialysis: Secondary | ICD-10-CM

## 2018-10-31 LAB — BASIC METABOLIC PANEL
Anion gap: 12 (ref 5–15)
BUN: 10 mg/dL (ref 8–23)
CO2: 26 mmol/L (ref 22–32)
Calcium: 8.8 mg/dL — ABNORMAL LOW (ref 8.9–10.3)
Chloride: 96 mmol/L — ABNORMAL LOW (ref 98–111)
Creatinine, Ser: 2.37 mg/dL — ABNORMAL HIGH (ref 0.44–1.00)
GFR calc Af Amer: 24 mL/min — ABNORMAL LOW (ref >60)
GFR calc non Af Amer: 21 mL/min — ABNORMAL LOW (ref >60)
Glucose, Bld: 91 mg/dL (ref 70–99)
Potassium: 3.4 mmol/L — ABNORMAL LOW (ref 3.5–5.1)
Sodium: 134 mmol/L — ABNORMAL LOW (ref 135–145)

## 2018-10-31 LAB — CBC
HCT: 35.7 % — ABNORMAL LOW (ref 36.0–46.0)
Hemoglobin: 11.6 g/dL — ABNORMAL LOW (ref 12.0–15.0)
MCH: 30.9 pg (ref 26.0–34.0)
MCHC: 32.5 g/dL (ref 30.0–36.0)
MCV: 95.2 fL (ref 80.0–100.0)
Platelets: 218 10*3/uL (ref 150–400)
RBC: 3.75 MIL/uL — ABNORMAL LOW (ref 3.87–5.11)
RDW: 18.8 % — ABNORMAL HIGH (ref 11.5–15.5)
WBC: 8.7 10*3/uL (ref 4.0–10.5)
nRBC: 0 % (ref 0.0–0.2)

## 2018-10-31 LAB — POC OCCULT BLOOD, ED: Fecal Occult Bld: NEGATIVE

## 2018-10-31 LAB — PREPARE RBC (CROSSMATCH)

## 2018-10-31 MED ORDER — METOPROLOL TARTRATE 25 MG PO TABS
25.0000 mg | ORAL_TABLET | Freq: Two times a day (BID) | ORAL | Status: DC
  Administered 2018-10-31: 10:00:00 25 mg via ORAL
  Filled 2018-10-31: qty 1

## 2018-10-31 NOTE — Evaluation (Signed)
Occupational Therapy Evaluation Patient Details Name: Erin Morgan MRN: [id number] DOB: 16-Jul-1955 Today's Date: 10/31/2018    History of Present Illness This 63 y.o. female admitted with Hbg 6.4.  Dx:  acute blood loss/symptomatic anemia in setting of ESRD with recent failed renal transplant, volume overload.  PMH includes:  ESRD, recent failed renal transplant, anemia of chronic disease, chronic diastolic CHF, NSTEMI, TIA   Clinical Impression   Patient evaluated by Occupational Therapy with no further acute OT needs identified. All education has been completed and the patient has no further questions. Pt requires min guard to min A for ADLs.  She has all DME.  Recommend HHOT.  See below for any follow-up Occupational Therapy or equipment needs. OT is signing off. Thank you for this referral.      Follow Up Recommendations  Home health OT;Supervision/Assistance - 24 hour    Equipment Recommendations  None recommended by OT    Recommendations for Other Services       Precautions / Restrictions Precautions Precautions: Fall      Mobility Bed Mobility Overal bed mobility: Needs Assistance Bed Mobility: Supine to Sit;Sit to Supine     Supine to sit: Min guard Sit to supine: Min assist   General bed mobility comments: assist to lift feet back on bed   Transfers Overall transfer level: Needs assistance Equipment used: Rolling walker (2 wheeled) Transfers: Sit to/from [facility] Sit to Stand: Min guard Stand pivot transfers: Min guard       General transfer comment: min guard assist for safety     Balance Overall balance assessment: Needs assistance Sitting-balance support: Feet supported Sitting balance-Leahy Scale: Good     Standing balance support: Single extremity supported;During functional activity Standing balance-Leahy Scale: Poor Standing balance comment: requires UE support                            ADL either  performed or assessed with clinical judgement   ADL Overall ADL's : Needs assistance/impaired Eating/Feeding: Independent   Grooming: Wash/dry hands;Wash/dry face;Oral care;Brushing hair;Min guard;Standing   Upper Body Bathing: Set up;Sitting   Lower Body Bathing: Minimal assistance;Sit to/from stand   Upper Body Dressing : Set up;Sitting   Lower Body Dressing: Min guard;Sit to/from stand   Toilet Transfer: Min guard;Ambulation;Comfort height toilet;RW   Toileting- Water quality scientist and Hygiene: Min guard;Sit to/from stand       Functional mobility during ADLs: Min guard;Rolling walker General ADL Comments: Pt fatigues      Vision Baseline Vision/History: Wears glasses Wears Glasses: At all times Patient Visual Report: No change from baseline       Perception     Praxis      Pertinent Vitals/Pain Pain Assessment: Faces Faces Pain Scale: Hurts little more Pain Location: "my skin"  Pain Descriptors / Indicators: Guarding Pain Intervention(s): Monitored during session     Hand Dominance Right   Extremity/Trunk Assessment Upper Extremity Assessment Upper Extremity Assessment: Generalized weakness   Lower Extremity Assessment Lower Extremity Assessment: Defer to PT evaluation   Cervical / Trunk Assessment Cervical / Trunk Assessment: Normal   Communication Communication Communication: No difficulties   Cognition Arousal/Alertness: Awake/alert Behavior During Therapy: WFL for tasks assessed/performed Overall Cognitive Status: Within Functional Limits for tasks assessed  General Comments: WFL for basic tasks.  cognition not formally assessed, - she would benefit from further assessment with HHOT    General Comments  VSS throughout session.  Mother present.  Discussed HHaide for home, but pt wishes to defer at this time     Exercises     Shoulder Instructions      Home Living Family/patient expects to be  discharged to:: Private residence Living Arrangements: Parent Available Help at Discharge: Family;Available 24 hours/day Type of Home: House Home Access: Level entry(small threshold at entry )     Home Layout: One level     Bathroom Shower/Tub: Occupational psychologist: Standard Bathroom Accessibility: Yes   Home Equipment: Environmental consultant - 2 wheels;Shower seat - built in;Bedside commode;Walker - 4 wheels   Additional Comments: Pt's elderly parents assist her       Prior Functioning/Environment Level of Independence: Needs assistance  Gait / Transfers Assistance Needed: Pt reports she was ambulating with RW, but fatigues  ADL's / Homemaking Assistance Needed: Pt reports she required min A for bathing and dressing.  Mother assists her, and assists with meal prep, she has hired assist for cleaning.  Pt is still working as Emergency planning/management officer at [employer]: Pt has only been home from [facility] for the past 1 week         OT Problem List: Decreased strength;Decreased activity tolerance;Impaired balance (sitting and/or standing);Decreased safety awareness;Decreased knowledge of use of DME or AE;Pain      OT Treatment/Interventions:      OT Goals(Current goals can be found in the care plan section) Acute Rehab OT Goals Patient Stated Goal: to go home  OT Goal Formulation: All assessment and education complete, DC therapy  OT Frequency:     Barriers to D/C:            Co-evaluation PT/OT/SLP Co-Evaluation/Treatment: Yes Reason for Co-Treatment: For patient/therapist safety;To address functional/ADL transfers   OT goals addressed during session: ADL's and self-care      AM-PAC OT "6 Clicks" Daily Activity     Outcome Measure Help from another person eating meals?: None Help from another person taking care of personal grooming?: A Little Help from another person toileting, which includes using toliet, bedpan, or urinal?: A Little Help from another person bathing (including  washing, rinsing, drying)?: A Little Help from another person to put on and taking off regular upper body clothing?: A Little Help from another person to put on and taking off regular lower body clothing?: A Little 6 Click Score: 19   End of Session Equipment Utilized During Treatment: Rolling walker Nurse Communication: Mobility status  Activity Tolerance: Patient limited by fatigue Patient left: in bed;with call bell/phone within reach;with family/visitor present  OT Visit Diagnosis: Unsteadiness on feet (R26.81)                Time: [id number] OT Time Calculation (min): 31 min Charges:  OT General Charges $OT Visit: 1 Visit OT Evaluation $OT Eval Moderate Complexity: 1 Mod  Lucille Passy, OTR/L Acute Rehabilitation Services Pager [phone] Office [phone]   Lucille Passy M 10/31/2018, 12:08 PM

## 2018-10-31 NOTE — ED Notes (Signed)
Patient stated she needs pudding to take her pills, and refuse to take them until pudding is available.

## 2018-10-31 NOTE — ED Notes (Signed)
Pt finished eating breakfast, very concerned about her POC and why she is still here.  Pt coes not want to stay in bed, wants to return home to continue her rehab.  C/o some pain to abdomen which Tylenol.  Will send a msg to MD to reval.POC.

## 2018-10-31 NOTE — Discharge Summary (Signed)
Physician Discharge Summary  Erin Morgan [id number] DOB: 1955-05-12 DOA: 10/30/2018  PCP: Aretta Nip, MD  Admit date: 10/30/2018 Discharge date: 10/31/2018  Admitted From: home Discharge disposition: home   Recommendations for Outpatient Follow-Up:   1. Close follow up of CBC 2. Home health PT/OT    Discharge Diagnosis:   Active Problems:   Symptomatic anemia    Discharge Condition: Improved.  Diet recommendation:renal  Wound care: None.  Code status: Full.   History of Present Illness:   Erin Morgan is a 63 y.o. female with medical history significant for end-stage renal disease now on HD Monday Wednesday Friday status post failed recent renal transplant done at [facility] (09/2018), anemia of chronic disease, chronic diastolic CHF, NSTEMI, TIA, who presented to [facility] ED as recommended by her nephrologist due to drop in hemoglobin down to 6.4 at her last hemodialysis session on Saturday, which she received in addition to her normal schedule HD schedule because of volume overload.  Patient reports generalized weakness, dizziness on standing, fatigue in the last couple of days.  Not associated with chest pain or shortness of breath.  Denies any abdominal pain.  Denies any overt bleeding.    Hospital Course by Problem:   Acute blood loss/symptomatic anemia in the setting of ESRD with recent failed renal transplant Suspect anemia of chronic disease in the setting of ESRD Hemoglobin 6.6 on presentation Baseline hemoglobin 10 (05/27/18) FOBT negative Resume immunosuppressants for renal transplant Resume home iron supplement -s/p PRBC in HD- Hgb 11.1 this AM  Volume overload in the setting of ESRD on HD Monday Wednesday Friday and hypoalbuminemia Sodium 131 on presentation; albumin 2.4. Volume overload on exam with 3+ pitting edema in lower extremities bilaterally and mild left JVD present Sp HD with improvement  High anion gap metabolic  acidosis likely in the setting of ESRD Presented with chemistry bicarb of 20 and anion gap of 17 -resolved this AM  Moderate hypoalbuminemia Albumin 2.4 on presentation LFTs normal  ESRD/recent renal transplant Management as stated above  Chronic diastolic CHF Last 2D echo done on 05/26/2018 showed normal LVEF Volume status managed with hemodialysis  Dizziness, possibly secondary to symptomatic anemia  OT recommends home health  Hyperlipidemia Resume Lipitor  GERD Resume omeprazole  Essential hypertension -resume BB As she is having sinus tachy from not getting    Medical Consultants:   renal   Discharge Exam:   Vitals:   10/31/18 0600 10/31/18 0950  BP:  (!) 133/98  Pulse: (!) 133 (!) 134  Resp: (!) 21   Temp:    SpO2: 100%    Vitals:   10/31/18 0429 10/31/18 0500 10/31/18 0600 10/31/18 0950  BP: (!) 133/98   (!) 133/98  Pulse: (!) 134 (!) 132 (!) 133 (!) 134  Resp: 18 (!) 22 (!) 21   Temp:      TempSrc:      SpO2: 100% 100% 100%     General exam: Appears calm and comfortable.    The results of significant diagnostics from this hospitalization (including imaging, microbiology, ancillary and laboratory) are listed below for reference.     Procedures and Diagnostic Studies:   Dg Chest Port 1 View  Result Date: 10/30/2018 CLINICAL DATA:  Fluid overload EXAM: PORTABLE CHEST 1 VIEW COMPARISON:  June 08, 2017 FINDINGS: The heart size is mildly enlarged. There is no pulmonary edema or vascular congestion. No pneumothorax. No acute osseous abnormality. No significant pleural effusion or  atelectasis. IMPRESSION: Mild cardiomegaly without evidence for pulmonary edema or significant volume overload. Electronically Signed   By: Constance Holster M.D.   On: 10/30/2018 17:44     Labs:   Basic Metabolic Panel: Recent Labs  Lab 10/30/18 1030 10/31/18 0500  NA 131* 134*  K 4.8 3.4*  CL 94* 96*  CO2 20* 26  GLUCOSE 97 91  BUN 29* 10  CREATININE  4.79* 2.37*  CALCIUM 8.8* 8.8*   GFR CrCl cannot be calculated (Unknown ideal weight.). Liver Function Tests: Recent Labs  Lab 10/30/18 1030  AST 36  ALT 22  ALKPHOS 61  BILITOT 0.6  PROT 4.8*  ALBUMIN 2.4*   No results for input(s): LIPASE, AMYLASE in the last 168 hours. No results for input(s): AMMONIA in the last 168 hours. Coagulation profile No results for input(s): INR, PROTIME in the last 168 hours.  CBC: Recent Labs  Lab 10/30/18 1030 10/31/18 0500  WBC 10.1 8.7  HGB 6.6* 11.6*  HCT 22.0* 35.7*  MCV 102.8* 95.2  PLT 250 218   Cardiac Enzymes: No results for input(s): CKTOTAL, CKMB, CKMBINDEX, TROPONINI in the last 168 hours. BNP: Invalid input(s): POCBNP CBG: No results for input(s): GLUCAP in the last 168 hours. D-Dimer No results for input(s): DDIMER in the last 72 hours. Hgb A1c No results for input(s): HGBA1C in the last 72 hours. Lipid Profile No results for input(s): CHOL, HDL, LDLCALC, TRIG, CHOLHDL, LDLDIRECT in the last 72 hours. Thyroid function studies No results for input(s): TSH, T4TOTAL, T3FREE, THYROIDAB in the last 72 hours.  Invalid input(s): FREET3 Anemia work up No results for input(s): VITAMINB12, FOLATE, FERRITIN, TIBC, IRON, RETICCTPCT in the last 72 hours. Microbiology Recent Results (from the past 240 hour(s))  SARS Coronavirus 2 by RT PCR (hospital order, performed in [facility] hospital lab) Nasopharyngeal Nasopharyngeal Swab     Status: None   Collection Time: 10/30/18  6:06 PM   Specimen: Nasopharyngeal Swab  Result Value Ref Range Status   SARS Coronavirus 2 NEGATIVE NEGATIVE Final    Comment: (NOTE) If result is NEGATIVE SARS-CoV-2 target nucleic acids are NOT DETECTED. The SARS-CoV-2 RNA is generally detectable in upper and lower  respiratory specimens during the acute phase of infection. The lowest  concentration of SARS-CoV-2 viral copies this assay can detect is 250  copies / mL. A negative result does not  preclude SARS-CoV-2 infection  and should not be used as the sole basis for treatment or other  patient management decisions.  A negative result may occur with  improper specimen collection / handling, submission of specimen other  than nasopharyngeal swab, presence of viral mutation(s) within the  areas targeted by this assay, and inadequate number of viral copies  (<250 copies / mL). A negative result must be combined with clinical  observations, patient history, and epidemiological information. If result is POSITIVE SARS-CoV-2 target nucleic acids are DETECTED. The SARS-CoV-2 RNA is generally detectable in upper and lower  respiratory specimens dur ing the acute phase of infection.  Positive  results are indicative of active infection with SARS-CoV-2.  Clinical  correlation with patient history and other diagnostic information is  necessary to determine patient infection status.  Positive results do  not rule out bacterial infection or co-infection with other viruses. If result is PRESUMPTIVE POSTIVE SARS-CoV-2 nucleic acids MAY BE PRESENT.   A presumptive positive result was obtained on the submitted specimen  and confirmed on repeat testing.  While 2019 novel coronavirus  (SARS-CoV-2)  nucleic acids may be present in the submitted sample  additional confirmatory testing may be necessary for epidemiological  and / or clinical management purposes  to differentiate between  SARS-CoV-2 and other Sarbecovirus currently known to infect humans.  If clinically indicated additional testing with an alternate test  methodology [id number]) is advised. The SARS-CoV-2 RNA is generally  detectable in upper and lower respiratory sp ecimens during the acute  phase of infection. The expected result is Negative. Fact Sheet for Patients:  StrictlyIdeas.no Fact Sheet for Healthcare Providers: BankingDealers.co.za This test is not yet approved or cleared by  the [place] FDA and has been authorized for detection and/or diagnosis of SARS-CoV-2 by FDA under an Emergency Use Authorization (EUA).  This EUA will remain in effect (meaning this test can be used) for the duration of the COVID-19 declaration under Section 564(b)(1) of the Act, 21 U.S.C. section 360bbb-3(b)(1), unless the authorization is terminated or revoked sooner. Performed at [facility], [place] [address], [place], [place] [address]      Discharge Instructions:   Discharge Instructions    Discharge instructions   Complete by: As directed    Renal diet Continue with HD   Increase activity slowly   Complete by: As directed      Allergies as of 10/31/2018   No Known Allergies     Medication List    STOP taking these medications   diltiazem 180 MG 24 hr capsule Commonly known as: CARDIZEM CD     TAKE these medications   acetaminophen 500 MG tablet Commonly known as: TYLENOL Take 500-1,000 mg by mouth every 8 (eight) hours as needed for headache.   aspirin EC 81 MG tablet Take 81 mg by mouth daily.   atorvastatin 10 MG tablet Commonly known as: LIPITOR Take 10 mg by mouth See admin instructions. Take 10 mg by mouth after dialysis only on Mon/Wed/Fri   Auryxia 1 GM 210 MG(Fe) tablet Generic drug: ferric citrate Take 210-630 mg by mouth See admin instructions. Take up to 630 mg by mouth with each meal and 210-420 mg with each protein-containing snack   calcitRIOL 0.5 MCG capsule Commonly known as: ROCALTROL Take 1 mcg by mouth daily.   docusate sodium 100 MG capsule Commonly known as: COLACE Take 100 mg by mouth 2 (two) times daily as needed for mild constipation.   EpiPen 2-Pak 0.3 mg/0.3 mL Soaj injection Generic drug: EPINEPHrine Inject 0.3 mg into the muscle once as needed (AS DIRECTED).   Gengraf 25 MG capsule Generic drug: cycloSPORINE modified Take 100 mg by mouth See admin instructions. Take 100 mg by mouth at 8 AM and 100 mg at 8 PM    hydroxychloroquine 200 MG tablet Commonly known as: PLAQUENIL Take 400 mg by mouth daily.   magnesium oxide 400 MG tablet Commonly known as: MAG-OX Take 400 mg by mouth See admin instructions. Take 400 mg by mouth three times a day, with dose 2/3 being held until after dialysis only on Mon/Wed/Fri   metoprolol tartrate 50 MG tablet Commonly known as: LOPRESSOR Take 25 mg by mouth 2 (two) times daily. Take 25 mg by mouth two times a day, with dose 2/2 being held until after dialysis only on Mon/Wed/Fri   multivitamin Tabs tablet Take 1 tablet by mouth at bedtime.   mycophenolate 180 MG EC tablet Commonly known as: MYFORTIC Take 360 mg by mouth See admin instructions. Take 360 mg by mouth at 8 AM and 360 mg at 8  PM   omeprazole 20 MG capsule Commonly known as: PRILOSEC Take 20 mg by mouth daily before breakfast.   ondansetron 8 MG disintegrating tablet Commonly known as: ZOFRAN-ODT Take 8 mg by mouth every 8 (eight) hours as needed for nausea or vomiting (DISSOLVE IN THE MOUTH).   polyethylene glycol powder 17 GM/SCOOP powder Commonly known as: GLYCOLAX/MIRALAX Take 17 g by mouth daily as needed for mild constipation (MIXED INTO WATER AS DIRECTED).   predniSONE 5 MG tablet Commonly known as: DELTASONE Take 5 mg by mouth daily with breakfast.   rOPINIRole 0.25 MG tablet Commonly known as: REQUIP Take 0.25 mg by mouth at bedtime.   sulfamethoxazole-trimethoprim 400-80 MG tablet Commonly known as: BACTRIM Take 1 tablet by mouth See admin instructions. Take 1 tablet by mouth every Mon/Wed/Fri after dialysis   Ultomiris 300 MG/30ML Soln injection Generic drug: ravulizumab-cwvz Inject 2,400 mg into the vein every 8 (eight) weeks.   valGANciclovir 450 MG tablet Commonly known as: VALCYTE Take 450 mg by mouth See admin instructions. Take 450 mg by mouth every Mon/Wed/Fri after dialysis      Follow-up Information    Rankins, Bill Salinas, MD Follow up in 1 week(s).    Specialty: Family Medicine Contact information: [place] [address] [phone]            Time coordinating discharge: 25 min  Signed:  Geradine Girt DO  [facility] 10/31/2018, 10:00 AM

## 2018-10-31 NOTE — ED Notes (Signed)
Pt consent at bedside. Signed from previous shift.

## 2018-10-31 NOTE — Progress Notes (Signed)
10/31/18 1400  PT Visit Information  Last PT Received On 10/31/18  Assistance Needed +1  PT/OT/SLP Co-Evaluation/Treatment Yes  Reason for Co-Treatment For patient/therapist safety;To address functional/ADL transfers  PT goals addressed during session Proper use of DME;Mobility/safety with mobility;Balance  History of Present Illness This 63 y.o. female admitted with Hbg 6.4.  Dx:  acute blood loss/symptomatic anemia in setting of ESRD with recent failed renal transplant, volume overload.  PMH includes:  ESRD, recent failed renal transplant, anemia of chronic disease, chronic diastolic CHF, NSTEMI, TIA  Precautions  Precautions Fall  Precaution Comments monitor vitals  Restrictions  Weight Bearing Restrictions No  Home Living  Family/patient expects to be discharged to: Private residence  Living Arrangements Parent  Available Help at Discharge Family;Available 24 hours/day  Type of Home House  Home Access Level entry  Home Layout One level  Bathroom Shower/Tub Walk-in Cytogeneticist Yes  [place] - 2 wheels;Shower seat - built in;BSC;Walker - 4 wheels  Additional Comments Pt's elderly parents assist her   Prior Function  Level of Independence Needs assistance  Gait / Transfers Assistance Needed RW for short trips with care  ADL's / Homemaking Assistance Needed Pt reports she required min A for bathing and dressing.  Mother assists her, and assists with meal prep, she has hired assist for cleaning.  Pt is still working as Emergency planning/management officer at [employer] has only been home from [employer] for the past 1 week   Communication  Communication No difficulties  Pain Assessment  Pain Assessment Faces  Faces Pain Scale 4  Pain Location "my skin"  (L arm)  Pain Descriptors / Indicators Guarding  Pain Intervention(s) Monitored during session;Repositioned  Cognition  Arousal/Alertness Awake/alert  Behavior During Therapy WFL for  tasks assessed/performed  Overall Cognitive Status Within Functional Limits for tasks assessed  Upper Extremity Assessment  Upper Extremity Assessment Generalized weakness  Lower Extremity Assessment  Lower Extremity Assessment Generalized weakness  Cervical / Trunk Assessment  Cervical / Trunk Assessment Normal  Bed Mobility  Overal bed mobility Needs Assistance  Bed Mobility Supine to Sit;Sit to Supine  Supine to sit Min assist  Sit to supine Min assist;Min guard  General bed mobility comments assisted to get legs to bed and to cue body mechanics  Transfers  Overall transfer level Needs assistance  Equipment used Rolling walker (2 wheeled)  Transfers Sit to/from Stand  Sit to Stand Min guard  Stand pivot transfers Min guard  General transfer comment for safety and due to vitals  Ambulation/Gait  Ambulation/Gait assistance Min guard  Gait Distance (Feet) 110 Feet  Assistive device Rolling walker (2 wheeled);1 person hand held assist  Gait Pattern/deviations Step-through pattern;Decreased stride length;Wide base of support  General Gait Details pt took her time, monitored vitals pre and post gait  Gait velocity reduced  Gait velocity interpretation <1.31 ft/sec, indicative of household ambulator  Balance  Overall balance assessment Needs assistance  Sitting-balance support Feet supported  Sitting balance-Leahy Scale Good  Standing balance support Bilateral upper extremity supported;During functional activity  Standing balance-Leahy Scale Poor  General Comments  General comments (skin integrity, edema, etc.) monitored supine sit and standing vitals  Exercises  Exercises Other exercises (strength is 4- RLE and LLE 4+ to5)  PT - End of Session  Equipment Utilized During Treatment Gait belt  Activity Tolerance Patient limited by fatigue;Treatment limited secondary to medical complications (Comment)  Patient left in bed;with  call bell/phone within reach;with family/visitor  present  Nurse Communication Mobility status  PT Assessment  PT Recommendation/Assessment Patient needs continued PT services  PT Visit Diagnosis Unsteadiness on feet (R26.81);Muscle weakness (generalized) (M62.81);Pain  Pain - Right/Left Left  Pain - part of body Arm  PT Problem List Decreased strength;Decreased range of motion;Decreased activity tolerance;Decreased balance;Decreased mobility;Decreased coordination;Cardiopulmonary status limiting activity;Pain  Barriers to Discharge Comments pt is in accessible home with regular help  PT Plan  PT Frequency (ACUTE ONLY) Min 3X/week  PT Treatment/Interventions (ACUTE ONLY) DME instruction;Gait training;Stair training;Functional mobility training;Therapeutic activities;Therapeutic exercise;Balance training;Neuromuscular re-education;Patient/family education  AM-PAC PT "6 Clicks" Mobility Outcome Measure (Version 2)  Help needed turning from your back to your side while in a flat bed without using bedrails? 4  Help needed moving from lying on your back to sitting on the side of a flat bed without using bedrails? 4  Help needed moving to and from a bed to a chair (including a wheelchair)? 3  Help needed standing up from a chair using your arms (e.g., wheelchair or bedside chair)? 3  Help needed to walk in hospital room? 3  Help needed climbing 3-5 steps with a railing?  2  6 Click Score 19  Consider Recommendation of Discharge To: Home with [facility]  PT Recommendation  Follow Up Recommendations Home health PT;Supervision for mobility/OOB  PT equipment None recommended by PT (pt has all equipment needs met for PT)  Individuals Consulted  Consulted and Agree with Results and Recommendations Patient  Acute Rehab PT Goals  Patient Stated Goal to go home   PT Goal Formulation With patient/family  PT Time Calculation  PT Start Time (ACUTE ONLY) 1032  PT Stop Time (ACUTE ONLY) 1104  PT Time Calculation (min) (ACUTE ONLY) 32 min  PT General Charges   $$ ACUTE PT VISIT 1 Visit  PT Evaluation  $PT Eval Low Complexity 1 Low  $PT Eval Moderate Complexity 1 Mod  Written Expression  Dominant Hand Right    Pt is given instructions for hip ext stretch and expected to follow up with nursing and PT/OT at home.  [place] values for BP and sats:  Supine pulse 135, sat 100%, BP 125/86, RR 25;  Sitting pulse 136, sat 100%, BP 134/95 and RR 20;  Standing pulse 137, BP 120/99, sat 100% and RR 20;  Post gait pulse 138, RR 25, BP 132/93 and sat 80% to 77% to recovery of 100%.  May need to have supplemental O2.  Mee Hives, PT MS Acute Rehab Dept. Number: [place] [facility] [place]

## 2018-10-31 NOTE — Discharge Planning (Signed)
[facility] consulted regarding home health services for pt.  EDCM met with pt at bedside to find pt active with [facility] for PT services. EDCM spoke with [place] of [facility] to confirm and add OT.

## 2018-10-31 NOTE — Progress Notes (Addendum)
Pt completed hemodialysis and Pt received 2 units PRBC transfusion during treatment. Pt tolerated well. Pt HR increased to 130. Dr. Marval Regal notified.

## 2018-10-31 NOTE — ED Notes (Signed)
Tele ordered bfast 

## 2018-10-31 NOTE — Progress Notes (Signed)
[facility] Progress Note  Subjective: patient stable , seen in ED, sp 2u prbc and HD overnight, no new c/o's.  Swelling in hands is "better".    Vitals:   10/31/18 0400 10/31/18 0429 10/31/18 0500 10/31/18 0600  BP:  (!) 133/98    Pulse: (!) 133 (!) 134 (!) 132 (!) 133  Resp: 20 18 (!) 22 (!) 21  Temp:      TempSrc:      SpO2: 100% 100% 100% 100%    Inpatient medications: . sodium chloride   Intravenous Once  . aspirin EC  81 mg Oral Daily  . [START ON 11/01/2018] atorvastatin  10 mg Oral Once per day on Mon Wed Fri  . Chlorhexidine Gluconate Cloth  6 each Topical Q0600  . metoprolol tartrate  25 mg Oral BID  . multivitamin  1 tablet Oral QHS  . mycophenolate  180 mg Oral BID  . pantoprazole  40 mg Oral Daily  . predniSONE  5 mg Oral Q breakfast  . sulfamethoxazole-trimethoprim  1 tablet Oral Q M,W,F  . valGANciclovir  450 mg Oral Q M,W,F   . sodium chloride     acetaminophen    Exam: General appearance: alert, cooperative, no distress and pale Head: Normocephalic, without obvious abnormality, atraumatic Resp: clear to auscultation bilaterally Cardio: regular rate and rhythm and no rub GI: soft, non-tender; bowel sounds normal; no masses,  no organomegaly Extremities: edema 2+ of legs and L arm Dialysis Access: Left AVF  Dialysis Orders: Center:  [place]  on MWF . EDW 51kg HD Bath 3K/2.25 Ca  Time 4 hrs Heparin none. Access  lavf BFR 400 DFR 800    Hectoral 2 mcg IV/HD Micera 225 mcg IV every 2 weeks   Assessment/Plan: 1.  Anemia ckd- sp 2u prbc's overnight.  Hb up 11. No active bleeding, likely CKD related. OK for dc today, have d/w primary. Will go to usual OP HD tomorrow.  2.  ESRD - on HD MWF. Recently started back on HD using AVF, after 1 yr on PD, then unsuccessful new renal transplant recently at [facility].   3.  Hypertension/volume  - anasarca due to recent long hosp stay and low albumin, slowly improving. No resp issues. Will cont to lower vol in OP  setting 4.  Vascular access: LAVF 5.  Metabolic bone disease -   Cont renal diet and follow ca/phos on auryxia. 6.  Nutrition - renal diet. 7. Hyponatremia- due to volume overload.     Rob Fredy Gladu 10/31/2018, 9:45 AM  Iron/TIBC/Ferritin/ %Sat    Component Value Date/Time   IRON 42 07/17/2014 2244   TIBC 290 07/17/2014 2244   FERRITIN 61 07/17/2014 2244   IRONPCTSAT 14 07/17/2014 2244   Recent Labs  Lab 10/30/18 1030 10/31/18 0500  NA 131* 134*  K 4.8 3.4*  CL 94* 96*  CO2 20* 26  GLUCOSE 97 91  BUN 29* 10  CREATININE 4.79* 2.37*  CALCIUM 8.8* 8.8*  ALBUMIN 2.4*  --    Recent Labs  Lab 10/30/18 1030  AST 36  ALT 22  ALKPHOS 61  BILITOT 0.6  PROT 4.8*   Recent Labs  Lab 10/31/18 0500  WBC 8.7  HGB 11.6*  HCT 35.7*  PLT 218

## 2018-10-31 NOTE — ED Notes (Signed)
Lunch Tray Ordered @ 1113.  

## 2018-10-31 NOTE — Progress Notes (Signed)
Occupational Therapy Note  OT eval completed.  Recommend HHOT, full eval write up to follow.  Lucille Passy, OTR/L Runner, broadcasting/film/video [phone] Office [phone]

## 2018-10-31 NOTE — ED Notes (Signed)
Pt ambulates to BR with limited need for (A).  Pt states she can't walk, but does so with only standby.   CM here and pt reports she already has home health services.  Will DC home with mom.

## 2018-11-01 DIAGNOSIS — Z992 Dependence on renal dialysis: Secondary | ICD-10-CM | POA: Diagnosis not present

## 2018-11-01 DIAGNOSIS — D509 Iron deficiency anemia, unspecified: Secondary | ICD-10-CM | POA: Diagnosis not present

## 2018-11-01 DIAGNOSIS — N186 End stage renal disease: Secondary | ICD-10-CM | POA: Diagnosis not present

## 2018-11-01 DIAGNOSIS — N2581 Secondary hyperparathyroidism of renal origin: Secondary | ICD-10-CM | POA: Diagnosis not present

## 2018-11-01 DIAGNOSIS — D631 Anemia in chronic kidney disease: Secondary | ICD-10-CM | POA: Diagnosis not present

## 2018-11-01 LAB — TYPE AND SCREEN
ABO/RH(D): O POS
Antibody Screen: NEGATIVE
Unit division: 0
Unit division: 0
Unit division: 0

## 2018-11-01 LAB — BPAM RBC
Blood Product Expiration Date: 202011182359
Blood Product Expiration Date: 202011282359
Blood Product Expiration Date: 202011292359
ISSUE DATE / TIME: 202010262106
ISSUE DATE / TIME: 202010262106
Unit Type and Rh: 5100
Unit Type and Rh: 5100
Unit Type and Rh: 5100

## 2018-11-02 DIAGNOSIS — Z792 Long term (current) use of antibiotics: Secondary | ICD-10-CM | POA: Diagnosis not present

## 2018-11-02 DIAGNOSIS — M069 Rheumatoid arthritis, unspecified: Secondary | ICD-10-CM | POA: Diagnosis not present

## 2018-11-02 DIAGNOSIS — Z7982 Long term (current) use of aspirin: Secondary | ICD-10-CM | POA: Diagnosis not present

## 2018-11-02 DIAGNOSIS — D649 Anemia, unspecified: Secondary | ICD-10-CM | POA: Diagnosis not present

## 2018-11-02 DIAGNOSIS — R269 Unspecified abnormalities of gait and mobility: Secondary | ICD-10-CM | POA: Diagnosis not present

## 2018-11-02 DIAGNOSIS — I120 Hypertensive chronic kidney disease with stage 5 chronic kidney disease or end stage renal disease: Secondary | ICD-10-CM | POA: Diagnosis not present

## 2018-11-02 DIAGNOSIS — Z940 Kidney transplant status: Secondary | ICD-10-CM | POA: Diagnosis not present

## 2018-11-02 DIAGNOSIS — I151 Hypertension secondary to other renal disorders: Secondary | ICD-10-CM | POA: Diagnosis not present

## 2018-11-02 DIAGNOSIS — Z4822 Encounter for aftercare following kidney transplant: Secondary | ICD-10-CM | POA: Diagnosis not present

## 2018-11-02 DIAGNOSIS — N186 End stage renal disease: Secondary | ICD-10-CM | POA: Diagnosis not present

## 2018-11-02 DIAGNOSIS — R600 Localized edema: Secondary | ICD-10-CM | POA: Diagnosis not present

## 2018-11-02 DIAGNOSIS — N2889 Other specified disorders of kidney and ureter: Secondary | ICD-10-CM | POA: Diagnosis not present

## 2018-11-02 DIAGNOSIS — Z992 Dependence on renal dialysis: Secondary | ICD-10-CM | POA: Diagnosis not present

## 2018-11-02 DIAGNOSIS — Z79899 Other long term (current) drug therapy: Secondary | ICD-10-CM | POA: Diagnosis not present

## 2018-11-02 DIAGNOSIS — Z7952 Long term (current) use of systemic steroids: Secondary | ICD-10-CM | POA: Diagnosis not present

## 2018-11-03 DIAGNOSIS — D509 Iron deficiency anemia, unspecified: Secondary | ICD-10-CM | POA: Diagnosis not present

## 2018-11-03 DIAGNOSIS — D631 Anemia in chronic kidney disease: Secondary | ICD-10-CM | POA: Diagnosis not present

## 2018-11-03 DIAGNOSIS — N2581 Secondary hyperparathyroidism of renal origin: Secondary | ICD-10-CM | POA: Diagnosis not present

## 2018-11-03 DIAGNOSIS — Z992 Dependence on renal dialysis: Secondary | ICD-10-CM | POA: Diagnosis not present

## 2018-11-03 DIAGNOSIS — N186 End stage renal disease: Secondary | ICD-10-CM | POA: Diagnosis not present

## 2018-11-05 DIAGNOSIS — N186 End stage renal disease: Secondary | ICD-10-CM | POA: Diagnosis not present

## 2018-11-05 DIAGNOSIS — M311 Thrombotic microangiopathy: Secondary | ICD-10-CM | POA: Diagnosis not present

## 2018-11-05 DIAGNOSIS — Z992 Dependence on renal dialysis: Secondary | ICD-10-CM | POA: Diagnosis not present

## 2018-11-06 DIAGNOSIS — D509 Iron deficiency anemia, unspecified: Secondary | ICD-10-CM | POA: Diagnosis not present

## 2018-11-06 DIAGNOSIS — D631 Anemia in chronic kidney disease: Secondary | ICD-10-CM | POA: Diagnosis not present

## 2018-11-06 DIAGNOSIS — N186 End stage renal disease: Secondary | ICD-10-CM | POA: Diagnosis not present

## 2018-11-06 DIAGNOSIS — Z992 Dependence on renal dialysis: Secondary | ICD-10-CM | POA: Diagnosis not present

## 2018-11-06 DIAGNOSIS — N2581 Secondary hyperparathyroidism of renal origin: Secondary | ICD-10-CM | POA: Diagnosis not present

## 2018-11-07 DIAGNOSIS — N186 End stage renal disease: Secondary | ICD-10-CM | POA: Diagnosis not present

## 2018-11-07 DIAGNOSIS — M069 Rheumatoid arthritis, unspecified: Secondary | ICD-10-CM | POA: Diagnosis not present

## 2018-11-07 DIAGNOSIS — Z992 Dependence on renal dialysis: Secondary | ICD-10-CM | POA: Diagnosis not present

## 2018-11-07 DIAGNOSIS — I7300 Raynaud's syndrome without gangrene: Secondary | ICD-10-CM | POA: Diagnosis not present

## 2018-11-07 DIAGNOSIS — D631 Anemia in chronic kidney disease: Secondary | ICD-10-CM | POA: Diagnosis not present

## 2018-11-07 DIAGNOSIS — I120 Hypertensive chronic kidney disease with stage 5 chronic kidney disease or end stage renal disease: Secondary | ICD-10-CM | POA: Diagnosis not present

## 2018-11-07 DIAGNOSIS — T8611 Kidney transplant rejection: Secondary | ICD-10-CM | POA: Diagnosis not present

## 2018-11-08 DIAGNOSIS — D509 Iron deficiency anemia, unspecified: Secondary | ICD-10-CM | POA: Diagnosis not present

## 2018-11-08 DIAGNOSIS — Z992 Dependence on renal dialysis: Secondary | ICD-10-CM | POA: Diagnosis not present

## 2018-11-08 DIAGNOSIS — D631 Anemia in chronic kidney disease: Secondary | ICD-10-CM | POA: Diagnosis not present

## 2018-11-08 DIAGNOSIS — N2581 Secondary hyperparathyroidism of renal origin: Secondary | ICD-10-CM | POA: Diagnosis not present

## 2018-11-08 DIAGNOSIS — N186 End stage renal disease: Secondary | ICD-10-CM | POA: Diagnosis not present

## 2018-11-10 DIAGNOSIS — D631 Anemia in chronic kidney disease: Secondary | ICD-10-CM | POA: Diagnosis not present

## 2018-11-10 DIAGNOSIS — N2581 Secondary hyperparathyroidism of renal origin: Secondary | ICD-10-CM | POA: Diagnosis not present

## 2018-11-10 DIAGNOSIS — D509 Iron deficiency anemia, unspecified: Secondary | ICD-10-CM | POA: Diagnosis not present

## 2018-11-10 DIAGNOSIS — N186 End stage renal disease: Secondary | ICD-10-CM | POA: Diagnosis not present

## 2018-11-10 DIAGNOSIS — Z992 Dependence on renal dialysis: Secondary | ICD-10-CM | POA: Diagnosis not present

## 2018-11-13 DIAGNOSIS — Z992 Dependence on renal dialysis: Secondary | ICD-10-CM | POA: Diagnosis not present

## 2018-11-13 DIAGNOSIS — D509 Iron deficiency anemia, unspecified: Secondary | ICD-10-CM | POA: Diagnosis not present

## 2018-11-13 DIAGNOSIS — N186 End stage renal disease: Secondary | ICD-10-CM | POA: Diagnosis not present

## 2018-11-13 DIAGNOSIS — D631 Anemia in chronic kidney disease: Secondary | ICD-10-CM | POA: Diagnosis not present

## 2018-11-13 DIAGNOSIS — N2581 Secondary hyperparathyroidism of renal origin: Secondary | ICD-10-CM | POA: Diagnosis not present

## 2018-11-14 DIAGNOSIS — Z992 Dependence on renal dialysis: Secondary | ICD-10-CM | POA: Diagnosis not present

## 2018-11-14 DIAGNOSIS — T8612 Kidney transplant failure: Secondary | ICD-10-CM | POA: Diagnosis not present

## 2018-11-14 DIAGNOSIS — N186 End stage renal disease: Secondary | ICD-10-CM | POA: Diagnosis not present

## 2018-11-14 DIAGNOSIS — Z466 Encounter for fitting and adjustment of urinary device: Secondary | ICD-10-CM | POA: Diagnosis not present

## 2018-11-14 DIAGNOSIS — Z940 Kidney transplant status: Secondary | ICD-10-CM | POA: Diagnosis not present

## 2018-11-14 DIAGNOSIS — R188 Other ascites: Secondary | ICD-10-CM | POA: Diagnosis not present

## 2018-11-15 ENCOUNTER — Ambulatory Visit: Payer: Medicare Other | Admitting: Neurology

## 2018-11-15 DIAGNOSIS — D631 Anemia in chronic kidney disease: Secondary | ICD-10-CM | POA: Diagnosis not present

## 2018-11-15 DIAGNOSIS — D509 Iron deficiency anemia, unspecified: Secondary | ICD-10-CM | POA: Diagnosis not present

## 2018-11-15 DIAGNOSIS — N186 End stage renal disease: Secondary | ICD-10-CM | POA: Diagnosis not present

## 2018-11-15 DIAGNOSIS — N2581 Secondary hyperparathyroidism of renal origin: Secondary | ICD-10-CM | POA: Diagnosis not present

## 2018-11-15 DIAGNOSIS — Z992 Dependence on renal dialysis: Secondary | ICD-10-CM | POA: Diagnosis not present

## 2018-11-16 DIAGNOSIS — Z79899 Other long term (current) drug therapy: Secondary | ICD-10-CM | POA: Diagnosis not present

## 2018-11-16 DIAGNOSIS — R600 Localized edema: Secondary | ICD-10-CM | POA: Diagnosis not present

## 2018-11-16 DIAGNOSIS — Z4822 Encounter for aftercare following kidney transplant: Secondary | ICD-10-CM | POA: Diagnosis not present

## 2018-11-16 DIAGNOSIS — Z940 Kidney transplant status: Secondary | ICD-10-CM | POA: Diagnosis not present

## 2018-11-16 DIAGNOSIS — D649 Anemia, unspecified: Secondary | ICD-10-CM | POA: Diagnosis not present

## 2018-11-16 DIAGNOSIS — I10 Essential (primary) hypertension: Secondary | ICD-10-CM | POA: Diagnosis not present

## 2018-11-16 DIAGNOSIS — Z992 Dependence on renal dialysis: Secondary | ICD-10-CM | POA: Diagnosis not present

## 2018-11-16 DIAGNOSIS — T8612 Kidney transplant failure: Secondary | ICD-10-CM | POA: Diagnosis not present

## 2018-11-16 DIAGNOSIS — I2510 Atherosclerotic heart disease of native coronary artery without angina pectoris: Secondary | ICD-10-CM | POA: Diagnosis not present

## 2018-11-16 DIAGNOSIS — N186 End stage renal disease: Secondary | ICD-10-CM | POA: Diagnosis not present

## 2018-11-16 DIAGNOSIS — M069 Rheumatoid arthritis, unspecified: Secondary | ICD-10-CM | POA: Diagnosis not present

## 2018-11-17 DIAGNOSIS — D509 Iron deficiency anemia, unspecified: Secondary | ICD-10-CM | POA: Diagnosis not present

## 2018-11-17 DIAGNOSIS — D631 Anemia in chronic kidney disease: Secondary | ICD-10-CM | POA: Diagnosis not present

## 2018-11-17 DIAGNOSIS — N2581 Secondary hyperparathyroidism of renal origin: Secondary | ICD-10-CM | POA: Diagnosis not present

## 2018-11-17 DIAGNOSIS — N186 End stage renal disease: Secondary | ICD-10-CM | POA: Diagnosis not present

## 2018-11-17 DIAGNOSIS — Z992 Dependence on renal dialysis: Secondary | ICD-10-CM | POA: Diagnosis not present

## 2018-11-20 DIAGNOSIS — D509 Iron deficiency anemia, unspecified: Secondary | ICD-10-CM | POA: Diagnosis not present

## 2018-11-20 DIAGNOSIS — Z992 Dependence on renal dialysis: Secondary | ICD-10-CM | POA: Diagnosis not present

## 2018-11-20 DIAGNOSIS — N2581 Secondary hyperparathyroidism of renal origin: Secondary | ICD-10-CM | POA: Diagnosis not present

## 2018-11-20 DIAGNOSIS — D631 Anemia in chronic kidney disease: Secondary | ICD-10-CM | POA: Diagnosis not present

## 2018-11-20 DIAGNOSIS — N186 End stage renal disease: Secondary | ICD-10-CM | POA: Diagnosis not present

## 2018-11-22 DIAGNOSIS — N186 End stage renal disease: Secondary | ICD-10-CM | POA: Diagnosis not present

## 2018-11-22 DIAGNOSIS — D509 Iron deficiency anemia, unspecified: Secondary | ICD-10-CM | POA: Diagnosis not present

## 2018-11-22 DIAGNOSIS — N2581 Secondary hyperparathyroidism of renal origin: Secondary | ICD-10-CM | POA: Diagnosis not present

## 2018-11-22 DIAGNOSIS — Z992 Dependence on renal dialysis: Secondary | ICD-10-CM | POA: Diagnosis not present

## 2018-11-22 DIAGNOSIS — D631 Anemia in chronic kidney disease: Secondary | ICD-10-CM | POA: Diagnosis not present

## 2018-11-24 DIAGNOSIS — Z992 Dependence on renal dialysis: Secondary | ICD-10-CM | POA: Diagnosis not present

## 2018-11-24 DIAGNOSIS — D509 Iron deficiency anemia, unspecified: Secondary | ICD-10-CM | POA: Diagnosis not present

## 2018-11-24 DIAGNOSIS — N186 End stage renal disease: Secondary | ICD-10-CM | POA: Diagnosis not present

## 2018-11-24 DIAGNOSIS — D631 Anemia in chronic kidney disease: Secondary | ICD-10-CM | POA: Diagnosis not present

## 2018-11-24 DIAGNOSIS — N2581 Secondary hyperparathyroidism of renal origin: Secondary | ICD-10-CM | POA: Diagnosis not present

## 2018-11-26 DIAGNOSIS — N186 End stage renal disease: Secondary | ICD-10-CM | POA: Diagnosis not present

## 2018-11-26 DIAGNOSIS — N2581 Secondary hyperparathyroidism of renal origin: Secondary | ICD-10-CM | POA: Diagnosis not present

## 2018-11-26 DIAGNOSIS — Z992 Dependence on renal dialysis: Secondary | ICD-10-CM | POA: Diagnosis not present

## 2018-11-26 DIAGNOSIS — D509 Iron deficiency anemia, unspecified: Secondary | ICD-10-CM | POA: Diagnosis not present

## 2018-11-26 DIAGNOSIS — D631 Anemia in chronic kidney disease: Secondary | ICD-10-CM | POA: Diagnosis not present

## 2018-11-27 ENCOUNTER — Telehealth: Payer: Self-pay | Admitting: Internal Medicine

## 2018-11-27 NOTE — Telephone Encounter (Signed)
° °  Patient is calling in regards to being concerned about not being able to get HR to slow down.   STAT if HR is under 50 or over 120 (normal HR is 60-100 beats per minute)  1) What is your heart rate? Ranges from 130-140, has been going on for the past 3 to 4 days.  2) Do you have a log of your heart rate readings (document readings)? No   3) Do you have any other symptoms? No

## 2018-11-27 NOTE — Telephone Encounter (Signed)
Called patient back about her message. Patient stated for the past 2 days her HR has been elevated between 120-140. Patient stated has tired taking diltizem 180 mg, which was d/c during her hospital stay in October. Patient was started on Metoprolol 25 mg. Patient stated she has not been taking metoprolol. Patient is also a dialysis patient and recently had kidney transplant that was not successful. Told patient that she needed to get evaluated sooner than later. Informed patient the earliest appointment we have is tomorrow at 3:45 pm with the DOD. Informed patient if her HR does not come down and her symptoms get worse to go to the ED. Patient will try to come tomorrow and see DOD. Patient verbalized understanding. Will forward to Dr. Rayann Heman for further advisement.

## 2018-11-28 ENCOUNTER — Ambulatory Visit: Payer: Medicare Other | Admitting: Cardiology

## 2018-11-28 DIAGNOSIS — N2581 Secondary hyperparathyroidism of renal origin: Secondary | ICD-10-CM | POA: Diagnosis not present

## 2018-11-28 DIAGNOSIS — N186 End stage renal disease: Secondary | ICD-10-CM | POA: Diagnosis not present

## 2018-11-28 DIAGNOSIS — Z992 Dependence on renal dialysis: Secondary | ICD-10-CM | POA: Diagnosis not present

## 2018-11-28 DIAGNOSIS — D509 Iron deficiency anemia, unspecified: Secondary | ICD-10-CM | POA: Diagnosis not present

## 2018-11-28 DIAGNOSIS — D631 Anemia in chronic kidney disease: Secondary | ICD-10-CM | POA: Diagnosis not present

## 2018-11-28 NOTE — Telephone Encounter (Signed)
Called pt to review need for OV today, Dr. Curt Bears reviewed chart and felt like call should have gone to Dr. Rayann Heman first before arranging appt in a DOD spot.  Pt reports that she has been experiencing elevated HRs for last 2 weeks (length of time HRs elevated was not reported until now).   HRs avg above 100s daily.  She is tired/fatigued.   Reports that she is taking Lopressor 1/2 tablet (25 mg total) BID.  However she did take whole tablet yesterday & today d/t elevated HRs.   SOB w/ activity. Denies syncope/near syncope, dizziness, edema. Failed kidney transplant recently.  Pt aware I will forward this to Dr. Rayann Heman and his nurse.  Pt aware office will contact her once advised by [facility].   Dr. Rayann Heman - pt last seen 07/2018, w/ 3 mo f/u recommendation by you.  Want to schedule her for a virtual to further discuss?

## 2018-11-28 NOTE — Progress Notes (Deleted)
Electrophysiology Office Note   Date:  11/28/2018   ID:  Erin Morgan, DOB Oct 12, 1955, MRN [id number]  PCP:  Aretta Nip, MD  Cardiologist:  *** Primary Electrophysiologist: ***  Meredith Leeds, MD    Chief Complaint: ***   History of Present Illness: Erin Morgan is a 63 y.o. female who is being seen today for the evaluation of *** at the request of Rankins, Bill Salinas, MD. Presenting today for electrophysiology evaluation.  She has a history of ectopic atrial tachycardia, ESRD on dialysis, hypertension.  She presents for palpitations today as a work in DOD visit.  Today, she denies*** symptoms of palpitations, chest pain, shortness of breath, orthopnea, PND, lower extremity edema, claudication, dizziness, presyncope, syncope, bleeding, or neurologic sequela. The patient is tolerating medications without difficulties.    Past Medical History:  Diagnosis Date  . Anemia   . Constipation   . Demand ischemia ([place])    a. 05/2018 elevated troponin in the setting of atrial tachycardia.  Catheterization: Normal coronaries.  . Ectopic atrial tachycardia ([place])    a. 05/2018-managed with diltiazem.  Marland Kitchen ESRD (end stage renal disease) ([place])    dialysis - t/th/sa- [place]  . Family history of adverse reaction to anesthesia    mom and sister has n/v  . GERD (gastroesophageal reflux disease)   . History of blood transfusion   . Hypertension   . Muscle weakness   . Osteopenia   . PONV (postoperative nausea and vomiting)    Headache  . Rheumatoid arthritis ([place])   . Seasonal allergies   . Thoracic aortic aneurysm ([place]) 07/18/2017   4.0 cm ascending thoracic noted on CT 07/18/2017  . Wears glasses    Past Surgical History:  Procedure Laterality Date  . AV FISTULA PLACEMENT Left 11/18/2014   Procedure: BRACHIOCEPHALIC ARTERIOVENOUS (AV) FISTULA CREATION;  Surgeon: Conrad Talmage, MD;  Location: [facility];  Service: Vascular;  Laterality: Left;  .  CARPAL TUNNEL RELEASE Right 04/05/2014   Procedure: RIGHT CARPAL TUNNEL RELEASE;  Surgeon: Daryll Brod, MD;  Location: [facility];  Service: Orthopedics;  Laterality: Right;  ANESTHESIA:  IV REGIONAL FAB  . COLONOSCOPY    . LEFT HEART CATH AND CORONARY ANGIOGRAPHY N/A 05/26/2018   Procedure: LEFT HEART CATH AND CORONARY ANGIOGRAPHY;  Surgeon: Martinique, Peter M, MD;  Location: [facility];  Service: Cardiovascular;  Laterality: N/A;  . REVISON OF ARTERIOVENOUS FISTULA Left 10/01/2016   Procedure: REVISON OF LEFT ARM ARTERIOVENOUS FISTULA;  Surgeon: Angelia Mould, MD;  Location: [facility];  Service: Vascular;  Laterality: Left;  . REVISON OF ARTERIOVENOUS FISTULA Left [id number]   Procedure: PLICATION OF LEFT ARM  ARTERIOVENOUS FISTULA;  Surgeon: Angelia Mould, MD;  Location: [facility];  Service: Vascular;  Laterality: Left;  . TONSILLECTOMY    . TUMOR EXCISION  2000   right foot     Current Outpatient Medications  Medication Sig Dispense Refill  . acetaminophen (TYLENOL) 500 MG tablet Take 500-1,000 mg by mouth every 8 (eight) hours as needed for headache.     Marland Kitchen aspirin EC 81 MG tablet Take 81 mg by mouth daily.    Marland Kitchen atorvastatin (LIPITOR) 10 MG tablet Take 10 mg by mouth See admin instructions. Take 10 mg by mouth after dialysis only on Mon/Wed/Fri    . calcitRIOL (ROCALTROL) 0.5 MCG capsule Take 1 mcg by mouth daily.    . cycloSPORINE modified (GENGRAF) 25 MG capsule Take 100 mg  by mouth See admin instructions. Take 100 mg by mouth at 8 AM and 100 mg at 8 PM    . docusate sodium (COLACE) 100 MG capsule Take 100 mg by mouth 2 (two) times daily as needed for mild constipation.    Marland Kitchen EPIPEN 2-PAK 0.3 MG/0.3ML SOAJ injection Inject 0.3 mg into the muscle once as needed (AS DIRECTED).     . ferric citrate (AURYXIA) 1 GM 210 MG(Fe) tablet Take 210-630 mg by mouth See admin instructions. Take up to 630 mg by mouth with each meal and 210-420 mg with each protein-containing  snack    . hydroxychloroquine (PLAQUENIL) 200 MG tablet Take 400 mg by mouth daily.     . magnesium oxide (MAG-OX) 400 MG tablet Take 400 mg by mouth See admin instructions. Take 400 mg by mouth three times a day, with dose 2/3 being held until after dialysis only on Mon/Wed/Fri    . metoprolol tartrate (LOPRESSOR) 50 MG tablet Take 25 mg by mouth 2 (two) times daily. Take 25 mg by mouth two times a day, with dose 2/2 being held until after dialysis only on Mon/Wed/Fri    . multivitamin (RENA-VIT) TABS tablet Take 1 tablet by mouth at bedtime. 30 tablet 3  . mycophenolate (MYFORTIC) 180 MG EC tablet Take 360 mg by mouth See admin instructions. Take 360 mg by mouth at 8 AM and 360 mg at 8 PM    . omeprazole (PRILOSEC) 20 MG capsule Take 20 mg by mouth daily before breakfast.     . ondansetron (ZOFRAN-ODT) 8 MG disintegrating tablet Take 8 mg by mouth every 8 (eight) hours as needed for nausea or vomiting (DISSOLVE IN THE MOUTH).     . polyethylene glycol powder (GLYCOLAX/MIRALAX) 17 GM/SCOOP powder Take 17 g by mouth daily as needed for mild constipation (MIXED INTO WATER AS DIRECTED).    . predniSONE (DELTASONE) 5 MG tablet Take 5 mg by mouth daily with breakfast.    . rOPINIRole (REQUIP) 0.25 MG tablet Take 0.25 mg by mouth at bedtime.    . sulfamethoxazole-trimethoprim (BACTRIM) 400-80 MG tablet Take 1 tablet by mouth See admin instructions. Take 1 tablet by mouth every Mon/Wed/Fri after dialysis    . ULTOMIRIS 300 MG/30ML SOLN injection Inject 2,400 mg into the vein every 8 (eight) weeks.     . valGANciclovir (VALCYTE) 450 MG tablet Take 450 mg by mouth See admin instructions. Take 450 mg by mouth every Mon/Wed/Fri after dialysis     No current facility-administered medications for this visit.     Allergies:   Patient has no known allergies.   Social History:  The patient  reports that she has never smoked. She has never used smokeless tobacco. She reports that she does not drink alcohol or  use drugs.   Family History:  The patient's family history includes CAD in her father; Cancer in her mother; Cervical cancer in her mother; Healthy in her brother and sister; Hyperlipidemia in her father and mother; Hypertension in her father and mother; Rheum arthritis in an other family member; Stroke in her father; Valvular heart disease in her father.    ROS:  Please see the history of present illness.   Otherwise, review of systems is positive for none.   All other systems are reviewed and negative.    PHYSICAL EXAM: VS:  There were no vitals taken for this visit. , BMI There is no height or weight on file to calculate BMI. GEN: Well nourished, well  developed, in no acute distress  HEENT: normal  Neck: no JVD, carotid bruits, or masses Cardiac: ***RRR; no murmurs, rubs, or gallops,no edema  Respiratory:  clear to auscultation bilaterally, normal work of breathing GI: soft, nontender, nondistended, + BS MS: no deformity or atrophy  Skin: warm and dry Neuro:  Strength and sensation are intact Psych: euthymic mood, full affect  EKG:  EKG {ACTION; IS/IS [id number] ordered today. Personal review of the ekg ordered *** shows ***  Recent Labs: 05/26/2018: B Natriuretic Peptide >4,500.0 05/28/2018: Magnesium 1.8 10/30/2018: ALT 22 10/31/2018: BUN 10; Creatinine, Ser 2.37; Hemoglobin 11.6; Platelets 218; Potassium 3.4; Sodium 134    Lipid Panel     Component Value Date/Time   CHOL 235 (H) 05/27/2018 0418   TRIG 61 05/27/2018 0418   HDL 44 05/27/2018 0418   CHOLHDL 5.3 05/27/2018 0418   VLDL 12 05/27/2018 0418   LDLCALC 179 (H) 05/27/2018 0418     Wt Readings from Last 3 Encounters:  08/15/18 103 lb 8 oz (46.9 kg)  07/05/18 96 lb 3.2 oz (43.6 kg)  06/12/18 101 lb 3.2 oz (45.9 kg)      Other studies Reviewed: Additional studies/ records that were reviewed today include: TTE 05/26/18  Review of the above records today demonstrates:   1. The left ventricle has low normal  systolic function, with an ejection fraction of 50-55%. The cavity size was normal. There is mild concentric left ventricular hypertrophy. Left ventricular diastolic Doppler parameters are consistent with  pseudonormalization.  2. The right ventricle has normal systolic function. The cavity was normal. There is no increase in right ventricular wall thickness.  3. Left atrial size was moderately dilated.  4. There is mild mitral annular calcification present. No evidence of mitral valve stenosis.  5. No vegetation on the aortic valve.  6. The aortic valve is tricuspid. Mild thickening of the aortic valve. Mild calcification of the aortic valve. Aortic valve regurgitation was not assessed by color flow Doppler.  LHC [id number]  LV end diastolic pressure is normal.   1. Normal coronary anatomy 2. Low LVEDP 8 mm Hg.  ASSESSMENT AND PLAN:  1.  Ectopic atrial tachycardia:***  2.  Hypertension:***    Current medicines are reviewed at length with the patient today.   The patient {ACTIONS; HAS/DOES NOT HAVE:19233} concerns regarding her medicines.  The following changes were made today:  {NONE DEFAULTED:18576::"none"}  Labs/ tests ordered today include: *** No orders of the defined types were placed in this encounter.    Disposition:   FU with   {gen number [id number] {Days to years:10300}  Signed,  Meredith Leeds, MD  11/28/2018 1:49 PM     [place] [address] [phone] (office) [phone] (fax)

## 2018-11-29 NOTE — Telephone Encounter (Signed)
Pt scheduled for virtual visit with JA on November 30

## 2018-11-30 ENCOUNTER — Emergency Department (HOSPITAL_COMMUNITY): Payer: Medicare Other

## 2018-11-30 ENCOUNTER — Inpatient Hospital Stay (HOSPITAL_COMMUNITY): Payer: Medicare Other

## 2018-11-30 ENCOUNTER — Encounter (HOSPITAL_COMMUNITY): Payer: Self-pay

## 2018-11-30 ENCOUNTER — Other Ambulatory Visit: Payer: Self-pay

## 2018-11-30 ENCOUNTER — Inpatient Hospital Stay (HOSPITAL_COMMUNITY)
Admission: EM | Admit: 2018-11-30 | Discharge: 2019-01-05 | DRG: 070 | Disposition: E | Payer: Medicare Other | Attending: Critical Care Medicine | Admitting: Critical Care Medicine

## 2018-11-30 DIAGNOSIS — I712 Thoracic aortic aneurysm, without rupture: Secondary | ICD-10-CM | POA: Diagnosis present

## 2018-11-30 DIAGNOSIS — Z8049 Family history of malignant neoplasm of other genital organs: Secondary | ICD-10-CM

## 2018-11-30 DIAGNOSIS — L8990 Pressure ulcer of unspecified site, unspecified stage: Secondary | ICD-10-CM | POA: Insufficient documentation

## 2018-11-30 DIAGNOSIS — K219 Gastro-esophageal reflux disease without esophagitis: Secondary | ICD-10-CM | POA: Diagnosis present

## 2018-11-30 DIAGNOSIS — I10 Essential (primary) hypertension: Secondary | ICD-10-CM | POA: Diagnosis not present

## 2018-11-30 DIAGNOSIS — Z20828 Contact with and (suspected) exposure to other viral communicable diseases: Secondary | ICD-10-CM | POA: Diagnosis not present

## 2018-11-30 DIAGNOSIS — I420 Dilated cardiomyopathy: Secondary | ICD-10-CM | POA: Diagnosis present

## 2018-11-30 DIAGNOSIS — I482 Chronic atrial fibrillation, unspecified: Secondary | ICD-10-CM | POA: Diagnosis not present

## 2018-11-30 DIAGNOSIS — D593 Hemolytic-uremic syndrome: Secondary | ICD-10-CM | POA: Diagnosis present

## 2018-11-30 DIAGNOSIS — E43 Unspecified severe protein-calorie malnutrition: Secondary | ICD-10-CM | POA: Diagnosis present

## 2018-11-30 DIAGNOSIS — Z8349 Family history of other endocrine, nutritional and metabolic diseases: Secondary | ICD-10-CM

## 2018-11-30 DIAGNOSIS — N2581 Secondary hyperparathyroidism of renal origin: Secondary | ICD-10-CM | POA: Diagnosis not present

## 2018-11-30 DIAGNOSIS — R531 Weakness: Secondary | ICD-10-CM | POA: Diagnosis not present

## 2018-11-30 DIAGNOSIS — I471 Supraventricular tachycardia: Secondary | ICD-10-CM | POA: Diagnosis present

## 2018-11-30 DIAGNOSIS — N186 End stage renal disease: Secondary | ICD-10-CM | POA: Diagnosis not present

## 2018-11-30 DIAGNOSIS — J9601 Acute respiratory failure with hypoxia: Secondary | ICD-10-CM | POA: Diagnosis not present

## 2018-11-30 DIAGNOSIS — A419 Sepsis, unspecified organism: Secondary | ICD-10-CM

## 2018-11-30 DIAGNOSIS — R0603 Acute respiratory distress: Secondary | ICD-10-CM

## 2018-11-30 DIAGNOSIS — R404 Transient alteration of awareness: Secondary | ICD-10-CM | POA: Diagnosis not present

## 2018-11-30 DIAGNOSIS — D539 Nutritional anemia, unspecified: Secondary | ICD-10-CM | POA: Diagnosis present

## 2018-11-30 DIAGNOSIS — I132 Hypertensive heart and chronic kidney disease with heart failure and with stage 5 chronic kidney disease, or end stage renal disease: Secondary | ICD-10-CM | POA: Diagnosis present

## 2018-11-30 DIAGNOSIS — Z8249 Family history of ischemic heart disease and other diseases of the circulatory system: Secondary | ICD-10-CM

## 2018-11-30 DIAGNOSIS — Z823 Family history of stroke: Secondary | ICD-10-CM

## 2018-11-30 DIAGNOSIS — I447 Left bundle-branch block, unspecified: Secondary | ICD-10-CM | POA: Diagnosis not present

## 2018-11-30 DIAGNOSIS — Z7952 Long term (current) use of systemic steroids: Secondary | ICD-10-CM

## 2018-11-30 DIAGNOSIS — R579 Shock, unspecified: Secondary | ICD-10-CM | POA: Diagnosis not present

## 2018-11-30 DIAGNOSIS — R7402 Elevation of levels of lactic acid dehydrogenase (LDH): Secondary | ICD-10-CM | POA: Diagnosis present

## 2018-11-30 DIAGNOSIS — I469 Cardiac arrest, cause unspecified: Secondary | ICD-10-CM

## 2018-11-30 DIAGNOSIS — E2740 Unspecified adrenocortical insufficiency: Secondary | ICD-10-CM | POA: Diagnosis not present

## 2018-11-30 DIAGNOSIS — R778 Other specified abnormalities of plasma proteins: Secondary | ICD-10-CM | POA: Diagnosis not present

## 2018-11-30 DIAGNOSIS — I2510 Atherosclerotic heart disease of native coronary artery without angina pectoris: Secondary | ICD-10-CM | POA: Diagnosis present

## 2018-11-30 DIAGNOSIS — K7689 Other specified diseases of liver: Secondary | ICD-10-CM | POA: Diagnosis not present

## 2018-11-30 DIAGNOSIS — Z992 Dependence on renal dialysis: Secondary | ICD-10-CM

## 2018-11-30 DIAGNOSIS — I462 Cardiac arrest due to underlying cardiac condition: Secondary | ICD-10-CM | POA: Diagnosis not present

## 2018-11-30 DIAGNOSIS — Z681 Body mass index (BMI) 19 or less, adult: Secondary | ICD-10-CM | POA: Diagnosis not present

## 2018-11-30 DIAGNOSIS — K7200 Acute and subacute hepatic failure without coma: Secondary | ICD-10-CM | POA: Diagnosis not present

## 2018-11-30 DIAGNOSIS — D696 Thrombocytopenia, unspecified: Secondary | ICD-10-CM | POA: Diagnosis not present

## 2018-11-30 DIAGNOSIS — D72829 Elevated white blood cell count, unspecified: Secondary | ICD-10-CM | POA: Diagnosis not present

## 2018-11-30 DIAGNOSIS — M8580 Other specified disorders of bone density and structure, unspecified site: Secondary | ICD-10-CM | POA: Diagnosis present

## 2018-11-30 DIAGNOSIS — Z7982 Long term (current) use of aspirin: Secondary | ICD-10-CM

## 2018-11-30 DIAGNOSIS — M1990 Unspecified osteoarthritis, unspecified site: Secondary | ICD-10-CM | POA: Diagnosis present

## 2018-11-30 DIAGNOSIS — I5032 Chronic diastolic (congestive) heart failure: Secondary | ICD-10-CM | POA: Diagnosis present

## 2018-11-30 DIAGNOSIS — Y830 Surgical operation with transplant of whole organ as the cause of abnormal reaction of the patient, or of later complication, without mention of misadventure at the time of the procedure: Secondary | ICD-10-CM | POA: Diagnosis present

## 2018-11-30 DIAGNOSIS — D631 Anemia in chronic kidney disease: Secondary | ICD-10-CM | POA: Diagnosis not present

## 2018-11-30 DIAGNOSIS — E872 Acidosis: Secondary | ICD-10-CM | POA: Diagnosis not present

## 2018-11-30 DIAGNOSIS — R4182 Altered mental status, unspecified: Secondary | ICD-10-CM

## 2018-11-30 DIAGNOSIS — R6521 Severe sepsis with septic shock: Secondary | ICD-10-CM | POA: Diagnosis not present

## 2018-11-30 DIAGNOSIS — T8612 Kidney transplant failure: Secondary | ICD-10-CM | POA: Diagnosis not present

## 2018-11-30 DIAGNOSIS — Z01818 Encounter for other preprocedural examination: Secondary | ICD-10-CM

## 2018-11-30 DIAGNOSIS — Z4682 Encounter for fitting and adjustment of non-vascular catheter: Secondary | ICD-10-CM | POA: Diagnosis not present

## 2018-11-30 DIAGNOSIS — Z79899 Other long term (current) drug therapy: Secondary | ICD-10-CM

## 2018-11-30 DIAGNOSIS — I361 Nonrheumatic tricuspid (valve) insufficiency: Secondary | ICD-10-CM | POA: Diagnosis not present

## 2018-11-30 DIAGNOSIS — J9600 Acute respiratory failure, unspecified whether with hypoxia or hypercapnia: Secondary | ICD-10-CM

## 2018-11-30 DIAGNOSIS — K5900 Constipation, unspecified: Secondary | ICD-10-CM | POA: Diagnosis present

## 2018-11-30 DIAGNOSIS — E871 Hypo-osmolality and hyponatremia: Secondary | ICD-10-CM | POA: Diagnosis not present

## 2018-11-30 DIAGNOSIS — E042 Nontoxic multinodular goiter: Secondary | ICD-10-CM | POA: Diagnosis present

## 2018-11-30 DIAGNOSIS — I21A1 Myocardial infarction type 2: Secondary | ICD-10-CM | POA: Diagnosis not present

## 2018-11-30 DIAGNOSIS — R001 Bradycardia, unspecified: Secondary | ICD-10-CM | POA: Diagnosis present

## 2018-11-30 DIAGNOSIS — G9349 Other encephalopathy: Secondary | ICD-10-CM | POA: Diagnosis not present

## 2018-11-30 DIAGNOSIS — R197 Diarrhea, unspecified: Secondary | ICD-10-CM | POA: Diagnosis not present

## 2018-11-30 DIAGNOSIS — R000 Tachycardia, unspecified: Secondary | ICD-10-CM | POA: Diagnosis not present

## 2018-11-30 DIAGNOSIS — R4020 Unspecified coma: Secondary | ICD-10-CM | POA: Diagnosis not present

## 2018-11-30 DIAGNOSIS — M069 Rheumatoid arthritis, unspecified: Secondary | ICD-10-CM | POA: Diagnosis present

## 2018-11-30 DIAGNOSIS — I442 Atrioventricular block, complete: Secondary | ICD-10-CM | POA: Diagnosis not present

## 2018-11-30 DIAGNOSIS — L89301 Pressure ulcer of unspecified buttock, stage 1: Secondary | ICD-10-CM | POA: Diagnosis present

## 2018-11-30 DIAGNOSIS — E8889 Other specified metabolic disorders: Secondary | ICD-10-CM | POA: Diagnosis present

## 2018-11-30 DIAGNOSIS — R410 Disorientation, unspecified: Secondary | ICD-10-CM | POA: Diagnosis not present

## 2018-11-30 DIAGNOSIS — R7982 Elevated C-reactive protein (CRP): Secondary | ICD-10-CM | POA: Diagnosis not present

## 2018-11-30 DIAGNOSIS — I120 Hypertensive chronic kidney disease with stage 5 chronic kidney disease or end stage renal disease: Secondary | ICD-10-CM | POA: Diagnosis not present

## 2018-11-30 DIAGNOSIS — I959 Hypotension, unspecified: Secondary | ICD-10-CM | POA: Diagnosis not present

## 2018-11-30 DIAGNOSIS — G9340 Encephalopathy, unspecified: Secondary | ICD-10-CM

## 2018-11-30 DIAGNOSIS — I4581 Long QT syndrome: Secondary | ICD-10-CM | POA: Diagnosis not present

## 2018-11-30 DIAGNOSIS — E8770 Fluid overload, unspecified: Secondary | ICD-10-CM | POA: Diagnosis not present

## 2018-11-30 LAB — CBC WITH DIFFERENTIAL/PLATELET
Abs Immature Granulocytes: 1.42 10*3/uL — ABNORMAL HIGH (ref 0.00–0.07)
Basophils Absolute: 0 10*3/uL (ref 0.0–0.1)
Basophils Relative: 0 %
Eosinophils Absolute: 0 10*3/uL (ref 0.0–0.5)
Eosinophils Relative: 0 %
HCT: 35.8 % — ABNORMAL LOW (ref 36.0–46.0)
Hemoglobin: 10.8 g/dL — ABNORMAL LOW (ref 12.0–15.0)
Immature Granulocytes: 11 %
Lymphocytes Relative: 1 %
Lymphs Abs: 0.2 10*3/uL — ABNORMAL LOW (ref 0.7–4.0)
MCH: 32 pg (ref 26.0–34.0)
MCHC: 30.2 g/dL (ref 30.0–36.0)
MCV: 106.2 fL — ABNORMAL HIGH (ref 80.0–100.0)
Monocytes Absolute: 1.2 10*3/uL — ABNORMAL HIGH (ref 0.1–1.0)
Monocytes Relative: 9 %
Neutro Abs: 10.5 10*3/uL — ABNORMAL HIGH (ref 1.7–7.7)
Neutrophils Relative %: 79 %
Platelets: 337 10*3/uL (ref 150–400)
RBC: 3.37 MIL/uL — ABNORMAL LOW (ref 3.87–5.11)
RDW: 19.4 % — ABNORMAL HIGH (ref 11.5–15.5)
WBC: 13.4 10*3/uL — ABNORMAL HIGH (ref 4.0–10.5)
nRBC: 0 % (ref 0.0–0.2)

## 2018-11-30 LAB — ETHANOL: Alcohol, Ethyl (B): <10 mg/dL (ref <10)

## 2018-11-30 LAB — COMPREHENSIVE METABOLIC PANEL
ALT: 44 U/L (ref 0–44)
AST: 37 U/L (ref 15–41)
Albumin: 2.7 g/dL — ABNORMAL LOW (ref 3.5–5.0)
Alkaline Phosphatase: 62 U/L (ref 38–126)
Anion gap: 16 — ABNORMAL HIGH (ref 5–15)
BUN: 32 mg/dL — ABNORMAL HIGH (ref 8–23)
CO2: 22 mmol/L (ref 22–32)
Calcium: 9.4 mg/dL (ref 8.9–10.3)
Chloride: 90 mmol/L — ABNORMAL LOW (ref 98–111)
Creatinine, Ser: 3.94 mg/dL — ABNORMAL HIGH (ref 0.44–1.00)
GFR calc Af Amer: 13 mL/min — ABNORMAL LOW (ref >60)
GFR calc non Af Amer: 11 mL/min — ABNORMAL LOW (ref >60)
Glucose, Bld: 99 mg/dL (ref 70–99)
Potassium: 4.9 mmol/L (ref 3.5–5.1)
Sodium: 128 mmol/L — ABNORMAL LOW (ref 135–145)
Total Bilirubin: 1.1 mg/dL (ref 0.3–1.2)
Total Protein: 5.3 g/dL — ABNORMAL LOW (ref 6.5–8.1)

## 2018-11-30 LAB — PROTIME-INR
INR: 1 (ref 0.8–1.2)
Prothrombin Time: 13.5 seconds (ref 11.4–15.2)

## 2018-11-30 LAB — TROPONIN I (HIGH SENSITIVITY)
Troponin I (High Sensitivity): 942 ng/L (ref <18)
Troponin I (High Sensitivity): 990 ng/L (ref <18)

## 2018-11-30 LAB — SARS CORONAVIRUS 2 (TAT 6-24 HRS): SARS Coronavirus 2: NEGATIVE

## 2018-11-30 LAB — CBG MONITORING, ED: Glucose-Capillary: 82 mg/dL (ref 70–99)

## 2018-11-30 MED ORDER — LABETALOL HCL 5 MG/ML IV SOLN: 10.0000 mg | Freq: Four times a day (QID) | INTRAVENOUS | Status: DC | PRN

## 2018-11-30 MED ORDER — HEPARIN BOLUS VIA INFUSION
2700.0000 [IU] | Freq: Once | INTRAVENOUS | Status: AC
  Administered 2018-11-30: 2700 [IU] via INTRAVENOUS
  Filled 2018-11-30: qty 2700

## 2018-11-30 MED ORDER — ACETAMINOPHEN 650 MG RE SUPP: 650.0000 mg | Freq: Four times a day (QID) | RECTAL | Status: DC | PRN

## 2018-11-30 MED ORDER — ACETAMINOPHEN 325 MG PO TABS
650.0000 mg | ORAL_TABLET | Freq: Four times a day (QID) | ORAL | Status: DC | PRN
  Administered 2018-12-03: 650 mg via ORAL
  Filled 2018-11-30: qty 2

## 2018-11-30 MED ORDER — LORAZEPAM 2 MG/ML IJ SOLN
1.0000 mg | INTRAMUSCULAR | Status: DC | PRN
  Administered 2018-11-30: 1 mg via INTRAVENOUS
  Filled 2018-11-30: qty 1

## 2018-11-30 MED ORDER — ASPIRIN EC 81 MG PO TBEC: 81.0000 mg | DELAYED_RELEASE_TABLET | Freq: Every day | ORAL | Status: DC

## 2018-11-30 MED ORDER — SODIUM CHLORIDE 0.9 % IV SOLN
INTRAVENOUS | Status: DC
  Administered 2018-11-30 (×2): via INTRAVENOUS

## 2018-11-30 MED ORDER — HEPARIN (PORCINE) 25000 UT/250ML-% IV SOLN
800.0000 [IU]/h | INTRAVENOUS | Status: DC
  Administered 2018-11-30 – 2018-12-01 (×3): 600 [IU]/h via INTRAVENOUS
  Administered 2018-12-03: 1100 [IU]/h via INTRAVENOUS
  Filled 2018-11-30 (×4): qty 250

## 2018-11-30 MED ORDER — CHLORHEXIDINE GLUCONATE CLOTH 2 % EX PADS
6.0000 | MEDICATED_PAD | Freq: Every day | CUTANEOUS | Status: DC
  Administered 2018-12-01 – 2018-12-07 (×6): 6 via TOPICAL

## 2018-11-30 NOTE — Progress Notes (Signed)
ANTICOAGULATION CONSULT NOTE  Pharmacy Consult for heparin Indication: ACS   Patient Measurements:   Heparin Dosing Weight: 45 kg  Vital Signs: Temp: 97.6 F (36.4 C) (11/26 0823) Temp Source: Oral (11/26 0823) BP: 142/103 (11/26 1300) Pulse Rate: 119 (11/26 1300)  Labs: Recent Labs    11/19/2018 0917 11/08/2018 1035  HGB 10.8*  --   HCT 35.8*  --   PLT 337  --   LABPROT 13.5  --   INR 1.0  --   CREATININE 3.94*  --   TROPONINIHS 942* 990*    CrCl cannot be calculated (Unknown ideal weight.).   Medical History: Past Medical History:  Diagnosis Date  . Anemia   . Constipation   . Demand ischemia ([place])    a. 05/2018 elevated troponin in the setting of atrial tachycardia.  Catheterization: Normal coronaries.  . Ectopic atrial tachycardia ([place])    a. 05/2018-managed with diltiazem.  Marland Kitchen ESRD (end stage renal disease) ([place])    dialysis - t/th/sa- [place]  . Family history of adverse reaction to anesthesia    mom and sister has n/v  . GERD (gastroesophageal reflux disease)   . History of blood transfusion   . Hypertension   . Muscle weakness   . Osteopenia   . PONV (postoperative nausea and vomiting)    Headache  . Rheumatoid arthritis ([place])   . Seasonal allergies   . Thoracic aortic aneurysm ([place]) 07/18/2017   4.0 cm ascending thoracic noted on CT 07/18/2017  . Wears glasses      Assessment: 63 yo female with ESRD on HD - due to failed kidney transplant. Pt is admitted due to altered mental status. Heparin infusion has been ordered due to elevated troponin. Unclear if pt is having chest pain. Pt is not on anticoagulation prior to admission. H/h 10.8/35, plts wnl.   Goal of Therapy:  Heparin level 0.3-0.7 units/ml Monitor platelets by anticoagulation protocol: Yes    Plan:  -Heparin bolus 2700 units x1 then 600 units/hr -Daily HL, CBC -Check level in 6 hours   Saara Kijowski, Jake Church 11/05/2018,2:18 PM

## 2018-11-30 NOTE — ED Notes (Signed)
Iv removed during transfer of patient upstairs, IV Team has already been consulted regarding missing blood draws and now a new IV. Admitting provider paged regarding occurrence.

## 2018-11-30 NOTE — H&P (Addendum)
History and Physical    Erin Morgan [id number] DOB: 1955/09/14 DOA: 11/27/2018  PCP: Aretta Nip, MD  Patient coming from: Home I have personally briefly reviewed patient's old medical records in [place]  Chief Complaint: AMS  HPI: Erin Morgan is a 63 y.o. female with medical history significant of hypertension, end-stage renal disease on hemodialysis (MWF) status post failed her recent renal transplant done at [facility] in September 2020, anemia of chronic disease, chronic diastolic congestive heart failure, chronic hyponatremia presents to emergency department due to altered mental status.  Patient's mom at bedside is the historian.  Patient's mom reports that patient had generalized weakness and fatigue since 1 week.  Reports that patient is more confused and slower than baseline since yesterday.  She had bowel movement in the bed and she was unaware of it.  Due to persistent confusion she brought patient to the emergency department for further evaluation and management.  No history of headache, blurry vision, seizure, loss of consciousness, lightheadedness, dizziness, head trauma, chest pain, shortness of breath, palpitation, leg swelling, nausea, vomiting, epigastric pain, melena, hematemesis, decreased appetite, COVID-19 exposure, cough, congestion, wheezing, runny nose or sore throat.  Patient lives alone however her mom is staying with her to take care of her.  No history of smoking, alcohol, illicit drug use.  She is compliant with her dialysis appointment.  Her last dialysis was on Tuesday due to Thanksgiving.  As per patient's mom-patient's immunosuppressant  recently changed.  ED Course: Upon arrival to ED: Patient was tachycardic, afebrile with leukocytosis of 13.4.  CT head without contrast negative, chest x-ray shows mild interstitial edema, no pneumonia.  COVID-19 pending.  Initial troponin elevated at 942 trended up to 990.  EDP consulted cardiology  and neurology.  MRI brain ordered and is pending.  Review of Systems: As per HPI otherwise negative.    Past Medical History:  Diagnosis Date   Anemia    Constipation    Demand ischemia ([place])    a. 05/2018 elevated troponin in the setting of atrial tachycardia.  Catheterization: Normal coronaries.   Ectopic atrial tachycardia ([place])    a. 05/2018-managed with diltiazem.   ESRD (end stage renal disease) ([place])    dialysis - t/th/sa- [place]   Family history of adverse reaction to anesthesia    mom and sister has n/v   GERD (gastroesophageal reflux disease)    History of blood transfusion    Hypertension    Muscle weakness    Osteopenia    PONV (postoperative nausea and vomiting)    Headache   Rheumatoid arthritis (HCC)    Seasonal allergies    Thoracic aortic aneurysm ([place]) 07/18/2017   4.0 cm ascending thoracic noted on CT 07/18/2017   Wears glasses     Past Surgical History:  Procedure Laterality Date   AV FISTULA PLACEMENT Left 11/18/2014   Procedure: BRACHIOCEPHALIC ARTERIOVENOUS (AV) FISTULA CREATION;  Surgeon: Conrad [place], MD;  Location: [facility];  Service: Vascular;  Laterality: Left;   CARPAL TUNNEL RELEASE Right 04/05/2014   Procedure: RIGHT CARPAL TUNNEL RELEASE;  Surgeon: Daryll Brod, MD;  Location: [facility];  Service: Orthopedics;  Laterality: Right;  ANESTHESIA:  IV REGIONAL FAB   COLONOSCOPY     LEFT HEART CATH AND CORONARY ANGIOGRAPHY N/A 05/26/2018   Procedure: LEFT HEART CATH AND CORONARY ANGIOGRAPHY;  Surgeon: Martinique, Peter M, MD;  Location: [facility];  Service: Cardiovascular;  Laterality: N/A;  REVISON OF ARTERIOVENOUS FISTULA Left 10/01/2016   Procedure: REVISON OF LEFT ARM ARTERIOVENOUS FISTULA;  Surgeon: Angelia Mould, MD;  Location: [facility];  Service: Vascular;  Laterality: Left;   REVISON OF ARTERIOVENOUS FISTULA Left [id number]   Procedure: PLICATION OF LEFT ARM  ARTERIOVENOUS  FISTULA;  Surgeon: Angelia Mould, MD;  Location: [facility];  Service: Vascular;  Laterality: Left;   TONSILLECTOMY     TUMOR EXCISION  2000   right foot     reports that she has never smoked. She has never used smokeless tobacco. She reports that she does not drink alcohol or use drugs.  No Known Allergies  Family History  Problem Relation Age of Onset   Hypertension Mother    Cervical cancer Mother    Cancer Mother    Hyperlipidemia Mother    Hypertension Father    Stroke Father    Hyperlipidemia Father    CAD Father        at age 43   Valvular heart disease Father        TAVR at age 8   Healthy Sister    Healthy Brother    Rheum arthritis Other     Prior to Admission medications   Medication Sig Start Date End Date Taking? Authorizing Provider  acetaminophen (TYLENOL) 500 MG tablet Take 500-1,000 mg by mouth every 8 (eight) hours as needed for headache.     [provider]  aspirin EC 81 MG tablet Take 81 mg by mouth daily.    [provider]  atorvastatin (LIPITOR) 10 MG tablet Take 10 mg by mouth See admin instructions. Take 10 mg by mouth after dialysis only on Mon/Wed/Fri    [provider]  calcitRIOL (ROCALTROL) 0.5 MCG capsule Take 1 mcg by mouth daily. 05/12/18   [provider]  cycloSPORINE modified (GENGRAF) 25 MG capsule Take 100 mg by mouth See admin instructions. Take 100 mg by mouth at 8 AM and 100 mg at 8 PM    [provider]  docusate sodium (COLACE) 100 MG capsule Take 100 mg by mouth 2 (two) times daily as needed for mild constipation.    [provider]  EPIPEN 2-PAK 0.3 MG/0.3ML SOAJ injection Inject 0.3 mg into the muscle once as needed (AS DIRECTED).  08/21/18   [provider]  ferric citrate (AURYXIA) 1 GM 210 MG(Fe) tablet Take 210-630 mg by mouth See admin instructions. Take up to 630 mg by mouth with each meal and 210-420 mg with each protein-containing snack     [provider]  hydroxychloroquine (PLAQUENIL) 200 MG tablet Take 400 mg by mouth daily.     [provider]  magnesium oxide (MAG-OX) 400 MG tablet Take 400 mg by mouth See admin instructions. Take 400 mg by mouth three times a day, with dose 2/3 being held until after dialysis only on Mon/Wed/Fri    [provider]  metoprolol tartrate (LOPRESSOR) 50 MG tablet Take 25 mg by mouth 2 (two) times daily. Take 25 mg by mouth two times a day, with dose 2/2 being held until after dialysis only on Mon/Wed/Fri    [provider]  multivitamin (RENA-VIT) TABS tablet Take 1 tablet by mouth at bedtime. 05/28/18   Theora Gianotti, NP  mycophenolate (MYFORTIC) 180 MG EC tablet Take 360 mg by mouth See admin instructions. Take 360 mg by mouth at 8 AM and 360 mg at 8 PM    [provider]  omeprazole (  PRILOSEC) 20 MG capsule Take 20 mg by mouth daily before breakfast.     [provider]  ondansetron (ZOFRAN-ODT) 8 MG disintegrating tablet Take 8 mg by mouth every 8 (eight) hours as needed for nausea or vomiting (DISSOLVE IN THE MOUTH).     [provider]  polyethylene glycol powder (GLYCOLAX/MIRALAX) 17 GM/SCOOP powder Take 17 g by mouth daily as needed for mild constipation (MIXED INTO WATER AS DIRECTED).    [provider]  predniSONE (DELTASONE) 5 MG tablet Take 5 mg by mouth daily with breakfast.    [provider]  rOPINIRole (REQUIP) 0.25 MG tablet Take 0.25 mg by mouth at bedtime.    [provider]  sulfamethoxazole-trimethoprim (BACTRIM) 400-80 MG tablet Take 1 tablet by mouth See admin instructions. Take 1 tablet by mouth every Mon/Wed/Fri after dialysis    [provider]  ULTOMIRIS 300 MG/30ML SOLN injection Inject 2,400 mg into the vein every 8 (eight) weeks.  05/09/18   [provider]  valGANciclovir (VALCYTE) 450 MG tablet Take 450 mg by mouth See admin instructions. Take 450 mg by  mouth every Mon/Wed/Fri after dialysis    [provider]    Physical Exam: Vitals:   11/24/2018 1245 11/25/2018 1300 11/25/2018 1415 11/25/2018 1445  BP: (!) 135/93 (!) 142/103 (!) 129/93 (!) 127/94  Pulse:  (!) 119    Resp: 19 20  18   Temp:      TempSrc:      SpO2:  100%      Constitutional: NAD, calm, comfortable, alert but disoriented.  Very thin and lean. Eyes: PERRL, lids and conjunctivae normal ENMT: Mucous membranes are dry. Posterior pharynx clear of any exudate or lesions.Normal dentition.  Neck: normal, supple, no masses, no thyromegaly, no neck stiffness. Respiratory: clear to auscultation bilaterally, no wheezing, no crackles. Normal respiratory effort. No accessory muscle use.  Cardiovascular: Regular rate and rhythm, no murmurs / rubs / gallops. No extremity edema. 2+ pedal pulses. No carotid bruits.  Abdomen: no tenderness, no masses palpated. No hepatosplenomegaly. Bowel sounds positive.  Musculoskeletal: no clubbing / cyanosis. No joint deformity upper and lower extremities. Good ROM, no contractures. Normal muscle tone.  Skin: no rashes, lesions, ulcers. No induration Neurologic: Patient is moving all extremities.  Unable to perform neurologic exam due to patient's mental status.    Labs on Admission: I have personally reviewed following labs and imaging studies  CBC: Recent Labs  Lab 11/11/2018 0917  WBC 13.4*  NEUTROABS 10.5*  HGB 10.8*  HCT 35.8*  MCV 106.2*  PLT [id number]   Basic Metabolic Panel: Recent Labs  Lab 12/03/2018 0917  NA 128*  K 4.9  CL 90*  CO2 22  GLUCOSE 99  BUN 32*  CREATININE 3.94*  CALCIUM 9.4   GFR: CrCl cannot be calculated (Unknown ideal weight.). Liver Function Tests: Recent Labs  Lab 11/29/2018 0917  AST 37  ALT 44  ALKPHOS 62  BILITOT 1.1  PROT 5.3*  ALBUMIN 2.7*   No results for input(s): LIPASE, AMYLASE in the last 168 hours. No results for input(s): AMMONIA in the last 168 hours. Coagulation Profile: Recent  Labs  Lab 11/21/2018 0917  INR 1.0   Cardiac Enzymes: No results for input(s): CKTOTAL, CKMB, CKMBINDEX, TROPONINI in the last 168 hours. BNP (last 3 results) No results for input(s): PROBNP in the last 8760 hours. HbA1C: No results for input(s): HGBA1C in the last 72 hours. CBG: Recent Labs  Lab 11/13/2018 0928  GLUCAP 82  Lipid Profile: No results for input(s): CHOL, HDL, LDLCALC, TRIG, CHOLHDL, LDLDIRECT in the last 72 hours. Thyroid Function Tests: No results for input(s): TSH, T4TOTAL, FREET4, T3FREE, THYROIDAB in the last 72 hours. Anemia Panel: No results for input(s): VITAMINB12, FOLATE, FERRITIN, TIBC, IRON, RETICCTPCT in the last 72 hours. Urine analysis:    Component Value Date/Time   COLORURINE YELLOW 07/19/2014 [place] 07/19/2014 1607   LABSPEC 1.011 07/19/2014 1607   PHURINE 7.5 07/19/2014 1607   GLUCOSEU NEGATIVE 07/19/2014 1607   HGBUR NEGATIVE 07/19/2014 1607   BILIRUBINUR NEGATIVE 07/19/2014 1607   KETONESUR NEGATIVE 07/19/2014 1607   PROTEINUR 100 (A) 07/19/2014 1607   UROBILINOGEN 0.2 07/19/2014 1607   NITRITE NEGATIVE 07/19/2014 1607   LEUKOCYTESUR NEGATIVE 07/19/2014 1607    Radiological Exams on Admission: Ct Head Wo Contrast  Result Date: 11/05/2018 CLINICAL DATA:  Altered level of consciousness.  Confusion EXAM: CT HEAD WITHOUT CONTRAST TECHNIQUE: Contiguous axial images were obtained from the base of the skull through the vertex without intravenous contrast. COMPARISON:  MRI head 09/19/2018 FINDINGS: Brain: Image quality degraded by motion. Negative for acute infarct, hemorrhage, or mass.  No midline shift. Vascular: Negative for hyperdense vessel Skull: Negative Sinuses/Orbits: Negative Other: None IMPRESSION: No acute abnormality.  Mild atrophy. Motion degraded study. Electronically Signed   By: Franchot Gallo M.D.   On: 11/29/2018 10:05   Dg Chest Port 1 View  Result Date: 11/27/2018 CLINICAL DATA:  Altered mental status  EXAM: PORTABLE CHEST 1 VIEW COMPARISON:  10/30/2018 FINDINGS: Normal mediastinum and cardiac silhouette. Normal pulmonary vasculature. Fine linear interstitial pattern noted primarily in the RIGHT upper lobe. No pneumothorax. No evidence of effusion, infiltrate, or pneumothorax. No acute bony abnormality. IMPRESSION: 1. No acute cardiopulmonary process. 2. Probable mild interstitial edema. 3. No overt pulmonary edema.  No evidence pneumonia. Electronically Signed   By: Suzy Bouchard M.D.   On: 12/02/2018 09:02    EKG: Sinus tachycardia, IVCD, chronic ST-T wave changes including anterior ST elevation.  Assessment/Plan Principal Problem:   AMS (altered mental status) Active Problems:   ESRD on dialysis [facility])   Essential hypertension   Hyponatremia   Elevated troponin   Macrocytic anemia   GERD (gastroesophageal reflux disease)   Leukocytosis   Tachycardia   Failed kidney transplant    Altered mental status: -Could be secondary to acute metabolic encephalopathy?  Less likely meningitis as patient is afebrile, no neck stiffness. -CT head without contrast came back negative.  MRI brain is pending.  Patient is afebrile, with leukocytosis of 13.4, COVID-19: Pending, chest x-ray negative for pneumonia.   Ethanol level: WNL. -We will admit patient at stepdown unit for close monitoring. -Ordered blood culture, ammonia level, hepatic function panel, TSH, B12, folate level, RPR.  Unable to check UA and urine drug screen as patient is anuric. -EDP consulted neurology-await recommendation. -We will keep her n.p.o. until she passes a bedside swallow evaluation.  Neurochecks every 4 hour. -On seizure and aspiration precaution. -Consulted PT/ST/OT  Elevated troponin: -Likely in the setting of end-stage renal disease.   -No ACS symptoms as per patient's mom. -No acute EKG changes.  Troponin trended up from 942-990.   -Will trend troponin. -EDP consulted cardiology-recommended to trend troponin.   If troponin trended up-she likely needs cath.  She started on heparin gtt in the ED, will continue. -Reviewed the previous echo which showed ejection fraction of 50 to 55% with grade 2 diastolic dysfunction.  Ordered repeat echo.  End-stage renal  disease on hemodialysis: -Patient is compliant with her hemodialysis appointment. -History of failed renal transplant-On immunosuppressant -Potassium: WNL.  No signs of fluid overload. -Consulted nephrology for dialysis tomorrow a.m.  Hypertension: Blood pressure is within normal limits. -We will hold her home blood pressure medication-as patient is n.p.o. -Labetalol as needed for elevated blood pressure.  Macrocytic anemia: H&H is stable -No signs of bleeding.  Check TSH, B12 and folate -Monitor H&H.  Grade 2 diastolic dysfunction: Not in acute exacerbation. -Last echo on 05/26/2018. -Chest x-ray shows mild interstitial edema, no pulmonary edema. -No signs of fluid overload noted on exam. -Strict INO's and daily weight.  Monitor signs for fluid overload.  Monitor electrolytes.  Hyponatremia: Chronic -Monitor for now.  Hold all PO home meds-as pt is NPO due to AMS. Can be resume tomorrow if her mentation back to her baseline.  DVT prophylaxis: TED/SCD/Heparin gtt Code Status: Full code-confirmed with patient's mom Family Communication: Patient's mom present at bedside.  Plan of care discussed with patient's mom in length and she verbalized understanding and agreed with it. Disposition Plan: TBD Consults called: Neurology, cardiology Dr. Harl Bowie and nephrology Dr. Carolin Sicks admission status: Inpatient at stepdown unit  Mckinley Jewel MD [facility] Pager [phone]  If 7PM-7AM, please contact night-coverage [email or web address] Password [facility]  11/25/2018, 4:17 PM

## 2018-11-30 NOTE — ED Notes (Signed)
ED TO INPATIENT HANDOFF REPORT  ED Nurse Name and Phone #: Lunette Stands [phone]  S Name/Age/Gender Erin Morgan 63 y.o. female Room/Bed: 034C/034C  Code Status   Code Status: Full Code  Home/SNF/Other Home Patient oriented to: self, place and situation Is this baseline? No   Triage Complete: Triage complete  Chief Complaint AMS Dialysis/ weakness  Triage Note Pt BIB [facility] from home w/ c/o generalized weakness ongoing for 1 week. Per EMS, family reports pt is more confused and slower than baseline. Pt w/ hx of rejected kidney transplant and is on dialysis, next scheduled session is tomorrow, 11/27. Pt is A&Ox3, disoriented to time, NAD noted.    Allergies No Known Allergies  Level of Care/Admitting Diagnosis ED Disposition    ED Disposition Condition [facility] Area: [facility] [[id number]]  Level of Care: Progressive [102]  Admit to Progressive based on following criteria: NEUROLOGICAL AND NEUROSURGICAL complex patients with significant risk of instability, who do not meet ICU criteria, yet require close observation or frequent assessment (< / = every 2 - 4 hours) with medical / nursing intervention.  Covid Evaluation: Asymptomatic Screening Protocol (No Symptoms)  Diagnosis: AMS (altered mental status) [id number]  Admitting Physician: Mckinley Jewel [id number]  Attending Physician: Mckinley Jewel [phone]  Estimated length of stay: 3 - 4 days  Certification:: I certify this patient will need inpatient services for at least 2 midnights  PT Class (Do Not Modify): Inpatient [101]  PT Acc Code (Do Not Modify): Private [1]       B Medical/Surgery History Past Medical History:  Diagnosis Date  . Anemia   . Constipation   . Demand ischemia ([place])    a. 05/2018 elevated troponin in the setting of atrial tachycardia.  Catheterization: Normal coronaries.  . Ectopic atrial tachycardia ([place])    a. 05/2018-managed with diltiazem.  Marland Kitchen ESRD  (end stage renal disease) ([place])    dialysis - t/th/sa- [facility]  . Family history of adverse reaction to anesthesia    mom and sister has n/v  . GERD (gastroesophageal reflux disease)   . History of blood transfusion   . Hypertension   . Muscle weakness   . Osteopenia   . PONV (postoperative nausea and vomiting)    Headache  . Rheumatoid arthritis ([place])   . Seasonal allergies   . Thoracic aortic aneurysm ([place]) 07/18/2017   4.0 cm ascending thoracic noted on CT 07/18/2017  . Wears glasses    Past Surgical History:  Procedure Laterality Date  . AV FISTULA PLACEMENT Left 11/18/2014   Procedure: BRACHIOCEPHALIC ARTERIOVENOUS (AV) FISTULA CREATION;  Surgeon: Conrad Sparta, MD;  Location: [facility];  Service: Vascular;  Laterality: Left;  . CARPAL TUNNEL RELEASE Right 04/05/2014   Procedure: RIGHT CARPAL TUNNEL RELEASE;  Surgeon: Daryll Brod, MD;  Location: [facility];  Service: Orthopedics;  Laterality: Right;  ANESTHESIA:  IV REGIONAL FAB  . COLONOSCOPY    . LEFT HEART CATH AND CORONARY ANGIOGRAPHY N/A 05/26/2018   Procedure: LEFT HEART CATH AND CORONARY ANGIOGRAPHY;  Surgeon: Martinique, Peter M, MD;  Location: [facility];  Service: Cardiovascular;  Laterality: N/A;  . REVISON OF ARTERIOVENOUS FISTULA Left 10/01/2016   Procedure: REVISON OF LEFT ARM ARTERIOVENOUS FISTULA;  Surgeon: Angelia Mould, MD;  Location: [facility];  Service: Vascular;  Laterality: Left;  . REVISON OF ARTERIOVENOUS FISTULA Left [id number]   Procedure: PLICATION OF LEFT ARM  ARTERIOVENOUS FISTULA;  Surgeon: Angelia Mould, MD;  Location: [facility];  Service: Vascular;  Laterality: Left;  . TONSILLECTOMY    . TUMOR EXCISION  2000   right foot     A IV Location/Drains/Wounds Patient Lines/Drains/Airways Status   Active Line/Drains/Airways    Name:   Placement date:   Placement time:   Site:   Days:   Peripheral IV 11/15/2018 Right;Posterior Hand   11/16/2018    -    Hand    less than 1   Fistula / Graft Left Forearm   11/18/14    1305    Forearm   1473   Fistula / Graft Left Upper arm Arteriovenous fistula   10/01/16    -    Upper arm   790   Hemodialysis Catheter Right Other (Comment) Double-lumen   07/24/14    -    Other (Comment)   1590   Airway   10/01/16    0733     790   Incision (Closed) 07/23/14 Flank Left   07/23/14    1239     1591   Incision (Closed) 11/18/14 Arm Left   11/18/14    1246     1473   Incision (Closed) 10/01/16 Arm Left   10/01/16    0749     790   Incision (Closed) 10/31/17 Arm Left   10/31/17    0821     395          Intake/Output Last 24 hours  Intake/Output Summary (Last 24 hours) at 11/10/2018 1836 Last data filed at 11/11/2018 1443 Gross per 24 hour  Intake 640.59 ml  Output -  Net 640.59 ml    Labs/Imaging Results for orders placed or performed during the hospital encounter of 11/23/2018 (from the past 48 hour(s))  Comprehensive metabolic panel     Status: Abnormal   Collection Time: 11/14/2018  9:17 AM  Result Value Ref Range   Sodium 128 (L) 135 - 145 mmol/L   Potassium 4.9 3.5 - 5.1 mmol/L   Chloride 90 (L) 98 - 111 mmol/L   CO2 22 22 - 32 mmol/L   Glucose, Bld 99 70 - 99 mg/dL   BUN 32 (H) 8 - 23 mg/dL   Creatinine, Ser 3.94 (H) 0.44 - 1.00 mg/dL   Calcium 9.4 8.9 - 10.3 mg/dL   Total Protein 5.3 (L) 6.5 - 8.1 g/dL   Albumin 2.7 (L) 3.5 - 5.0 g/dL   AST 37 15 - 41 U/L   ALT 44 0 - 44 U/L   Alkaline Phosphatase 62 38 - 126 U/L   Total Bilirubin 1.1 0.3 - 1.2 mg/dL   GFR calc non Af Amer 11 (L) >60 mL/min   GFR calc Af Amer 13 (L) >60 mL/min   Anion gap 16 (H) 5 - 15    Comment: Performed at [facility], [address] [place], [place] [address]  Ethanol     Status: None   Collection Time: 12/04/2018  9:17 AM  Result Value Ref Range   Alcohol, Ethyl (B) <10 <10 mg/dL    Comment: (NOTE) Lowest detectable limit for serum alcohol is 10 mg/dL. For medical purposes only. Performed at [facility], [place] [address], [place], [place] [address]   CBC WITH DIFFERENTIAL     Status: Abnormal   Collection Time: 11/21/2018  9:17 AM  Result Value Ref Range   WBC 13.4 (H) 4.0 - 10.5 K/uL   RBC  3.37 (L) 3.87 - 5.11 MIL/uL   Hemoglobin 10.8 (L) 12.0 - 15.0 g/dL   HCT 35.8 (L) 36.0 - 46.0 %   MCV 106.2 (H) 80.0 - 100.0 fL   MCH 32.0 26.0 - 34.0 pg   MCHC 30.2 30.0 - 36.0 g/dL   RDW 19.4 (H) 11.5 - 15.5 %   Platelets 337 150 - 400 K/uL   nRBC 0.0 0.0 - 0.2 %   Neutrophils Relative % 79 %   Neutro Abs 10.5 (H) 1.7 - 7.7 K/uL   Lymphocytes Relative 1 %   Lymphs Abs 0.2 (L) 0.7 - 4.0 K/uL   Monocytes Relative 9 %   Monocytes Absolute 1.2 (H) 0.1 - 1.0 K/uL   Eosinophils Relative 0 %   Eosinophils Absolute 0.0 0.0 - 0.5 K/uL   Basophils Relative 0 %   Basophils Absolute 0.0 0.0 - 0.1 K/uL   WBC Morphology DOHLE BODIES     Comment: MILD LEFT SHIFT (1-5% METAS, OCC MYELO, OCC BANDS)   Immature Granulocytes 11 %   Abs Immature Granulocytes 1.42 (H) 0.00 - 0.07 K/uL    Comment: Performed at [facility], [address] [place], [place] [address]  Protime-INR     Status: None   Collection Time: 11/14/2018  9:17 AM  Result Value Ref Range   Prothrombin Time 13.5 11.4 - 15.2 seconds   INR 1.0 0.8 - 1.2    Comment: (NOTE) INR goal varies based on device and disease states. Performed at [facility], [place] [address] [place], [place] [address]   Troponin I (High Sensitivity)     Status: Abnormal   Collection Time: 12/02/2018  9:17 AM  Result Value Ref Range   Troponin I (High Sensitivity) 942 (HH) <18 ng/L    Comment: CRITICAL RESULT CALLED TO, READ BACK BY AND VERIFIED WITH: G.KOPP,RN @ 1015 11/25/2018 WEBBERJ (NOTE) Elevated high sensitivity troponin I (hsTnI) values and significant  changes across serial measurements may suggest ACS but many other  chronic and acute conditions are known to elevate hsTnI results.  Refer to the Links section for chest pain algorithms and  additional  guidance. Performed at [facility], [place] [address], [place], [place] [address]   CBG monitoring, ED     Status: None   Collection Time: 11/15/2018  9:28 AM  Result Value Ref Range   Glucose-Capillary 82 70 - 99 mg/dL  Troponin I (High Sensitivity)     Status: Abnormal   Collection Time: 11/05/2018 10:35 AM  Result Value Ref Range   Troponin I (High Sensitivity) 990 (HH) <18 ng/L    Comment: CRITICAL VALUE NOTED.  VALUE IS CONSISTENT WITH PREVIOUSLY REPORTED AND CALLED VALUE. (NOTE) Elevated high sensitivity troponin I (hsTnI) values and significant  changes across serial measurements may suggest ACS but many other  chronic and acute conditions are known to elevate hsTnI results.  Refer to the Links section for chest pain algorithms and additional  guidance. Performed at [facility], [place] [address], [place], [place] [address]    Ct Head Wo Contrast  Result Date: 11/06/2018 CLINICAL DATA:  Altered level of consciousness.  Confusion EXAM: CT HEAD WITHOUT CONTRAST TECHNIQUE: Contiguous axial images were obtained from the base of the skull through the vertex without intravenous contrast. COMPARISON:  MRI head 09/19/2018 FINDINGS: Brain: Image quality degraded by motion. Negative for acute infarct, hemorrhage, or mass.  No midline shift. Vascular: Negative for hyperdense vessel Skull: Negative Sinuses/Orbits:  Negative Other: None IMPRESSION: No acute abnormality.  Mild atrophy. Motion degraded study. Electronically Signed   By: Franchot Gallo M.D.   On: 11/10/2018 10:05   Dg Chest Port 1 View  Result Date: 11/26/2018 CLINICAL DATA:  Altered mental status EXAM: PORTABLE CHEST 1 VIEW COMPARISON:  10/30/2018 FINDINGS: Normal mediastinum and cardiac silhouette. Normal pulmonary vasculature. Fine linear interstitial pattern noted primarily in the RIGHT upper lobe. No pneumothorax. No evidence of effusion, infiltrate, or pneumothorax. No acute bony abnormality. IMPRESSION:  1. No acute cardiopulmonary process. 2. Probable mild interstitial edema. 3. No overt pulmonary edema.  No evidence pneumonia. Electronically Signed   By: Suzy Bouchard M.D.   On: 11/19/2018 09:02    Pending Labs Unresulted Labs (From admission, onward)    Start     Ordered   12/02/18 0500  Heparin level (unfractionated)  Daily,   R     11/18/2018 1428   12/01/18 0500  CBC  Daily,   R     11/10/2018 1428   12/01/18 [id number]  Basic metabolic panel  Tomorrow morning,   R     11/29/2018 1601   11/26/2018 2200  Heparin level (unfractionated)  Once-Timed,   STAT     11/24/2018 1428   12/04/2018 1605  Culture, blood (routine x 2)  BLOOD CULTURE X 2,   R     11/10/2018 1604   11/21/2018 1605  Ammonia  Add-on,   AD     11/29/2018 1604   11/26/2018 1605  Vitamin B12  Add-on,   AD     11/28/2018 1604   11/06/2018 1605  Folate RBC  Add-on,   AD     11/10/2018 1604   11/17/2018 1605  RPR  Add-on,   AD     11/17/2018 1604   11/06/2018 1602  Magnesium  Once,   STAT     11/19/2018 1601   11/06/2018 1602  Phosphorus  Once,   STAT     11/25/2018 1601   11/28/2018 1602  Hepatic function panel  Once,   STAT     11/22/2018 1601   11/22/2018 1602  TSH  Once,   STAT     11/21/2018 1601   11/16/2018 1600  HIV Antibody (routine testing w rflx)  (HIV Antibody (Routine testing w reflex) panel)  Once,   STAT     11/25/2018 1601   11/18/2018 1518  SARS CORONAVIRUS 2 (TAT 6-24 HRS) Nasopharyngeal Nasopharyngeal Swab  (Asymptomatic/Tier 3)  Once,   STAT    Question Answer Comment  Is this test for diagnosis or screening Screening   Symptomatic for COVID-19 as defined by CDC No   Hospitalized for COVID-19 No   Admitted to ICU for COVID-19 No   Previously tested for COVID-19 Yes   Resident in a congregate (group) care setting No   Employed in healthcare setting No   Pregnant No      11/06/2018 1517   11/05/2018 0832  Urine rapid drug screen (hosp performed)not at [facility],   STAT     11/10/2018 0834   11/06/2018 0832  Urinalysis, Complete w  Microscopic  ONCE - STAT,   STAT     11/07/2018 0834          Vitals/Pain Today's Vitals   11/14/2018 1630 11/09/2018 1645 12/02/2018 1700 11/23/2018 1715  BP: (!) 125/94 128/89 131/88 (!) 128/91  Pulse:    (!) 111  Resp: 18 (!) 26 17 (!) 27  Temp:  TempSrc:      SpO2:    98%  PainSc:        Isolation Precautions No active isolations  Medications Medications  0.9 %  sodium chloride infusion ( Intravenous New Bag/Given 11/07/2018 1457)  heparin ADULT infusion 100 units/mL (25000 units/251mL sodium chloride 0.45%) (600 Units/hr Intravenous New Bag/Given 11/26/2018 1500)  LORazepam (ATIVAN) injection 1 mg (1 mg Intravenous Given 11/07/2018 1807)  acetaminophen (TYLENOL) tablet 650 mg (has no administration in time range)    Or  acetaminophen (TYLENOL) suppository 650 mg (has no administration in time range)  labetalol (NORMODYNE) injection 10 mg (has no administration in time range)  aspirin EC tablet 81 mg (has no administration in time range)  heparin bolus via infusion 2,700 Units (2,700 Units Intravenous Bolus from Bag 11/08/2018 1501)    Mobility non-ambulatory High fall risk   Focused Assessments Neuro Assessment Handoff:  Swallow screen pass? N/A         Neuro Assessment: Exceptions to WDL Neuro Checks:      Last Documented NIHSS Modified Score:   Has TPA been given? No If patient is a Neuro Trauma and patient is going to OR before floor call report to [place] nurse: [phone] or [phone]     R Recommendations: See Admitting Provider Note  Report given to:   Additional Notes: N/A

## 2018-11-30 NOTE — ED Triage Notes (Signed)
Pt BIB [facility] from home w/ c/o generalized weakness ongoing for 1 week. Per EMS, family reports pt is more confused and slower than baseline. Pt w/ hx of rejected kidney transplant and is on dialysis, next scheduled session is tomorrow, 11/27. Pt is A&Ox3, disoriented to time, NAD noted.

## 2018-11-30 NOTE — ED Notes (Signed)
Gave report to receiving nurse and charge RN, will take upstairs around [phone]

## 2018-11-30 NOTE — Progress Notes (Signed)
Called to assess for PIV for Heparin administration, and a second site for "lab draws". Only able to find one vein suitable for PIV. PIV started. No futher veins visualized by ultrasound or palpated for further access. Cristino Martes RN notified to contact MD if further access needs required.

## 2018-11-30 NOTE — Consult Note (Signed)
Cardiology Consultation:   Patient ID: Erin Morgan MRN: [id number]; DOB: Apr 22, 1955  Admit date: 12/04/2018 Date of Consult: 11/10/2018  Primary Care Provider: Aretta Nip, MD Primary Cardiologist: Mertie Moores, MD  Primary Electrophysiologist:  Thompson Grayer, MD    Patient Profile:   Erin Morgan is a 63 y.o. female with a hx of ESRD who is being seen today for the evaluation of elevated troponin at the request of Dr Vanita Panda.  History of Present Illness:   [name] 63 yo female history of ESRD, ectopic atach followed by EP, HTN admitted with generalized fatigue and altered mental status. She denies any cardiopulmonary symptoms. Due to an abnormal EKG troponins were checked and found to be elevated.    During 05/2018 admit with atach trop up to 3, cath normal at that time  Telephone note 11/24 has been having some elevated heart rates at home in low 100s. Was to f/u with EP next week   Na 128 Cr 3.94 BUN 32 WBC 13.4 Hgb 10.8 P,lt 337  hstrop 942-->990 CXR probable mild interstitial edema EKG SR, IVCD, chronic ST/T changes including anterior ST elevatoin 05/2018 LVEF 50-55%, grade II diastolic dysfunction, normal RV function 05/2018 cath: normal coronaries    Heart Pathway Score:     Past Medical History:  Diagnosis Date  . Anemia   . Constipation   . Demand ischemia ([place])    a. 05/2018 elevated troponin in the setting of atrial tachycardia.  Catheterization: Normal coronaries.  . Ectopic atrial tachycardia ([place])    a. 05/2018-managed with diltiazem.  Marland Kitchen ESRD (end stage renal disease) ([place])    dialysis - t/th/sa- [facility]  . Family history of adverse reaction to anesthesia    mom and sister has n/v  . GERD (gastroesophageal reflux disease)   . History of blood transfusion   . Hypertension   . Muscle weakness   . Osteopenia   . PONV (postoperative nausea and vomiting)    Headache  . Rheumatoid arthritis ([place])   . Seasonal  allergies   . Thoracic aortic aneurysm ([place]) 07/18/2017   4.0 cm ascending thoracic noted on CT 07/18/2017  . Wears glasses     Past Surgical History:  Procedure Laterality Date  . AV FISTULA PLACEMENT Left 11/18/2014   Procedure: BRACHIOCEPHALIC ARTERIOVENOUS (AV) FISTULA CREATION;  Surgeon: Conrad Grady, MD;  Location: [facility];  Service: Vascular;  Laterality: Left;  . CARPAL TUNNEL RELEASE Right 04/05/2014   Procedure: RIGHT CARPAL TUNNEL RELEASE;  Surgeon: Daryll Brod, MD;  Location: [facility];  Service: Orthopedics;  Laterality: Right;  ANESTHESIA:  IV REGIONAL FAB  . COLONOSCOPY    . LEFT HEART CATH AND CORONARY ANGIOGRAPHY N/A 05/26/2018   Procedure: LEFT HEART CATH AND CORONARY ANGIOGRAPHY;  Surgeon: Martinique, Peter M, MD;  Location: [facility];  Service: Cardiovascular;  Laterality: N/A;  . REVISON OF ARTERIOVENOUS FISTULA Left 10/01/2016   Procedure: REVISON OF LEFT ARM ARTERIOVENOUS FISTULA;  Surgeon: Angelia Mould, MD;  Location: [facility];  Service: Vascular;  Laterality: Left;  . REVISON OF ARTERIOVENOUS FISTULA Left [id number]   Procedure: PLICATION OF LEFT ARM  ARTERIOVENOUS FISTULA;  Surgeon: Angelia Mould, MD;  Location: [facility];  Service: Vascular;  Laterality: Left;  . TONSILLECTOMY    . TUMOR EXCISION  2000   right foot     Inpatient Medications: Scheduled Meds:  Continuous Infusions: . sodium chloride 125 mL/hr at 11/13/2018 0926   PRN  Meds:   Allergies:   No Known Allergies  Social History:   Social History   Socioeconomic History  . Marital status: Widowed    Spouse name: Not on file  . Number of children: 0  . Years of education: PhD  . Highest education level: Doctorate  Occupational History  . Not on file  Social Needs  . Financial resource strain: Not on file  . Food insecurity    Worry: Not on file    Inability: Not on file  . Transportation needs    Medical: Not on file    Non-medical: Not on file  Tobacco Use   . Smoking status: Never Smoker  . Smokeless tobacco: Never Used  Substance and Sexual Activity  . Alcohol use: No  . Drug use: No  . Sexual activity: Never  Lifestyle  . Physical activity    Days per week: Not on file    Minutes per session: Not on file  . Stress: Not on file  Relationships  . Social Herbalist on phone: Not on file    Gets together: Not on file    Attends religious service: Not on file    Active member of club or organization: Not on file    Attends meetings of clubs or organizations: Not on file    Relationship status: Not on file  . Intimate partner violence    Fear of current or ex partner: Not on file    Emotionally abused: Not on file    Physically abused: Not on file    Forced sexual activity: Not on file  Other Topics Concern  . Not on file  Social History Narrative   Lives at home alone.   Right-handed.   Occasional caffeine use.    Family History:    Family History  Problem Relation Age of Onset  . Hypertension Mother   . Cervical cancer Mother   . Cancer Mother   . Hyperlipidemia Mother   . Hypertension Father   . Stroke Father   . Hyperlipidemia Father   . CAD Father        at age 54  . Valvular heart disease Father        TAVR at age 67  . Healthy Sister   . Healthy Brother   . Rheum arthritis Other      ROS:  Please see the history of present illness.  All other ROS reviewed and negative.     Physical Exam/Data:   Vitals:   11/10/2018 1200 11/29/2018 1230 11/15/2018 1245 11/12/2018 1300  BP: (!) 144/107 (!) 134/93 (!) 135/93 (!) 142/103  Pulse:    (!) 119  Resp: 16 (!) 21 19 20   Temp:      TempSrc:      SpO2:  100%  100%   No intake or output data in the 24 hours ending 11/27/2018 1313 Last 3 Weights 08/15/2018 07/05/2018 06/12/2018  Weight (lbs) 103 lb 8 oz 96 lb 3.2 oz 101 lb 3.2 oz  Weight (kg) 46.947 kg 43.636 kg 45.904 kg  Some encounter information is confidential and restricted. Go to Review Flowsheets activity  to see all data.     There is no height or weight on file to calculate BMI.  General:  Well nourished, well developed, in no acute distress HEENT: normal Lymph: no adenopathy Neck: no JVD Endocrine:  No thryomegaly Vascular: No carotid bruits; FA pulses 2+ bilaterally without bruits  Cardiac:  Regular, tachy  Lungs:  clear to auscultation bilaterally, no wheezing, rhonchi or rales  Abd: soft, nontender, no hepatomegaly  Ext: no edema Musculoskeletal:  No deformities, BUE and BLE strength normal and equal Skin: warm and dry  Neuro:  CNs 2-12 intact, no focal abnormalities noted Psych:  Normal affect    Laboratory Data:  High Sensitivity Troponin:   Recent Labs  Lab 11/09/2018 0917 11/29/2018 1035  TROPONINIHS 942* 990*     Chemistry Recent Labs  Lab 11/06/2018 0917  NA 128*  K 4.9  CL 90*  CO2 22  GLUCOSE 99  BUN 32*  CREATININE 3.94*  CALCIUM 9.4  GFRNONAA 11*  GFRAA 13*  ANIONGAP 16*    Recent Labs  Lab 11/05/2018 0917  PROT 5.3*  ALBUMIN 2.7*  AST 37  ALT 44  ALKPHOS 62  BILITOT 1.1   Hematology Recent Labs  Lab 11/14/2018 0917  WBC 13.4*  RBC 3.37*  HGB 10.8*  HCT 35.8*  MCV 106.2*  MCH 32.0  MCHC 30.2  RDW 19.4*  PLT 337   BNPNo results for input(s): BNP, PROBNP in the last 168 hours.  DDimer No results for input(s): DDIMER in the last 168 hours.   Radiology/Studies:  Ct Head Wo Contrast  Result Date: 11/15/2018 CLINICAL DATA:  Altered level of consciousness.  Confusion EXAM: CT HEAD WITHOUT CONTRAST TECHNIQUE: Contiguous axial images were obtained from the base of the skull through the vertex without intravenous contrast. COMPARISON:  MRI head 09/19/2018 FINDINGS: Brain: Image quality degraded by motion. Negative for acute infarct, hemorrhage, or mass.  No midline shift. Vascular: Negative for hyperdense vessel Skull: Negative Sinuses/Orbits: Negative Other: None IMPRESSION: No acute abnormality.  Mild atrophy. Motion degraded study.  Electronically Signed   By: Franchot Gallo M.D.   On: 11/11/2018 10:05   Dg Chest Port 1 View  Result Date: 11/08/2018 CLINICAL DATA:  Altered mental status EXAM: PORTABLE CHEST 1 VIEW COMPARISON:  10/30/2018 FINDINGS: Normal mediastinum and cardiac silhouette. Normal pulmonary vasculature. Fine linear interstitial pattern noted primarily in the RIGHT upper lobe. No pneumothorax. No evidence of effusion, infiltrate, or pneumothorax. No acute bony abnormality. IMPRESSION: 1. No acute cardiopulmonary process. 2. Probable mild interstitial edema. 3. No overt pulmonary edema.  No evidence pneumonia. Electronically Signed   By: Suzy Bouchard M.D.   On: 11/29/2018 09:02    Assessment and Plan:   1. Elevated troponin - patient with no specific cardiopullmonary sytmptoms, presented with AMS.  -  elevated troponin in setting of ESRD overall nonspecific findings, chronic ST/T chagnes on EKG - 05/2018 cath with normal coronaries. During that admit trop was up to 3 by old standard but was having atach as well.  05/2018 echo LVEF 50-55%  - elevated troponin without a clear clinical context in an ESRD patient difficult to assess the significance. Its unclear if she may have missed an HD session from the notes.  - would follow trend, clearly if a further marked elevation may require repeat cath. Would also repeat echo for any new dysfunction.  - while underoing workup would use hep gtt, ASA.   2. AMS/Fatigue - per primary team. From patient's mothers report about 2 weeks ago patient was completley coherent with no cognifitive issues. Progressive confusion over the last week or two, actually found her at home in her own stool which is very atypical fo rher - CT head was benign - mild leukocytosis, unlcear if undelrying infection could be causing.   3. ESRD   4. Leukocytosis -  per primary team. Unclear if perhaps underlying infection could explain her AMS and tachycardia.   5. Atach - followed by EP  as outpatient. From 05/27/18 consult note plans for diliazem. Only antiarrhythmic she would be a candidate for is amio given renal faiulre and conduction disease, other considerations include ablation. Less likely flecanide given her first degree av block and IVCD.   - tele difficult to read whether sinus tach with her long pr or atach. If infection certaintly would have reason for sinus tasch   - from further chart review dilt held on admit 10/30/18 due to soft bp's, not restarted that admission. Looks like she was started on lopressor 25mg  bid instead.  - titrate beta blocker and follow rates, increase lopressor to 50mg  bid.   Due to multiple medical issues have asked patient be admitted to medicine team, we will follow as consult.    For questions or updates, please contact [facility] Please consult [email or web address] for contact info under     Signed, Carlyle Dolly, MD  12/03/2018 1:13 PM

## 2018-11-30 NOTE — ED Notes (Signed)
Pt Transported back from MRI

## 2018-11-30 NOTE — ED Notes (Addendum)
Pt mother updated per this RN

## 2018-11-30 NOTE — Significant Event (Addendum)
Rapid Response Event Note  Time Called: 2200(2219, and 2310) Arrival Time: 2235(and 2315) Event Type: Cardiac, Respiratory(and inability to get labs)  Overview:Called originally d/t inability to obtain needed labs. At that time, VSS. Asked RN to inform NP that labs couldn't be drawn. Called again at 2219 because RN noticed EKG rhythm change. Asked RN to get 12 lead EKG. When arrived to bedside, EKG reviewed-not very different from previous EKG. Monitor strip rhythm reviewed and lead II was being displayed on monitor at beginning of RNs shift and now it has been changed to the V lead. This change of leads explains rhythm change RN noticed(confirmed with RN). Called again at 2310 d/t SpO2-70s on RA. RN placed pt on 4L [place] with SpO2 increasing to 88%. Asked RN to increase FiO2 to [facility].   On assessment,  pt is awake but confused (baseline  since admission). Pt doesn't look uncomfortable or in  distress, lungs sounds diminished t/o. RR-32,  SpO2-90% on 6L [employer]. PCXR/ABG ordered and NP  informed and to bedside to assess pt.   Interventions: EKG 6L [place] PCXR ABG Plan of Care (if not transferred): Monitor pt resp status closely, attempt to wean [place] when SpO2 allows. Inform NP if PCXR/ABG abnormal. Call RRT if further assistance needed.  Event Summary: Name of Physician Notified: Kennon Holter, NP at 2330    at          [place], Carren Rang

## 2018-11-30 NOTE — Progress Notes (Addendum)
Oncall MD paged at 2324, currently awaiting reply. Return call received from MD at 2328, Dr.Blount updated on patient condition and increased oxygen demands.  MD aware of situation, and is en route to assess patient. Rapid response RN Mindy on unit and updated on patient's condition.

## 2018-11-30 NOTE — ED Notes (Signed)
NT and myself unable to draw blood specimens, IV Team notified.

## 2018-11-30 NOTE — ED Provider Notes (Signed)
[facility] EMERGENCY DEPARTMENT Provider Note   CSN: [id number] Arrival date & time: 11/21/2018  [id number]     History   Chief Complaint Chief Complaint  Patient presents with  . Weakness    HPI Erin Morgan is a 64 y.o. female.     HPI Patient presents from home via EMS with family concerns of weakness, decreased interactivity. Patient herself did not request transfer, states that she is unaware that her family requested that she be evaluated. She states that she feels about the same as usual, denies focal pain, focal weakness, including chest pain, headache, extremity weakness. She notes multiple medical issues, has a history of end-stage renal disease, status post failed transplant.  She is unsure if she went to dialysis yesterday, is scheduled to return tomorrow. Per EMS the patient's family states that she has been less interactive, more withdrawn over the past week. No report of fever, syncope, complaints of pain. The patient herself is not forthcoming with historical details, is withdrawn, but answers specific questions incessantly, seemingly appropriately. Past Medical History:  Diagnosis Date  . Anemia   . Constipation   . Demand ischemia ([place])    a. 05/2018 elevated troponin in the setting of atrial tachycardia.  Catheterization: Normal coronaries.  . Ectopic atrial tachycardia ([place])    a. 05/2018-managed with diltiazem.  Marland Kitchen ESRD (end stage renal disease) ([place])    dialysis - t/th/sa- Early  . Family history of adverse reaction to anesthesia    mom and sister has n/v  . GERD (gastroesophageal reflux disease)   . History of blood transfusion   . Hypertension   . Muscle weakness   . Osteopenia   . PONV (postoperative nausea and vomiting)    Headache  . Rheumatoid arthritis ([place])   . Seasonal allergies   . Thoracic aortic aneurysm ([place]) 07/18/2017   4.0 cm ascending thoracic noted on CT 07/18/2017  . Wears glasses      Patient Active Problem List   Diagnosis Date Noted  . TIA (transient ischemic attack) 08/15/2018  . Demand ischemia ([place]) 05/28/2018  . Ectopic atrial tachycardia ([place]) 05/28/2018  . Hypokalemia 05/28/2018  . Hypomagnesemia 05/28/2018  . Prolonged QT interval 05/28/2018  . NSTEMI (non-ST elevated myocardial infarction) ([place]) 05/26/2018  . Weakness 01/12/2017  . Symptomatic anemia 07/26/2015  . GERD (gastroesophageal reflux disease) 07/26/2015  . ESRD (end stage renal disease) ([place])   . 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 Elevated troponin 07/18/2014  . Headache disorder   . Rheumatoid arthritis ([place]) 07/17/2014  . Hypertensive urgency 07/17/2014  . ESRD on dialysis ([place]) 07/17/2014  . Hyponatremia 07/17/2014  . Essential hypertension     Past Surgical History:  Procedure Laterality Date  . AV FISTULA PLACEMENT Left 11/18/2014   Procedure: BRACHIOCEPHALIC ARTERIOVENOUS (AV) FISTULA CREATION;  Surgeon: Conrad Sausalito, MD;  Location: [facility];  Service: Vascular;  Laterality: Left;  . CARPAL TUNNEL RELEASE Right 04/05/2014   Procedure: RIGHT CARPAL TUNNEL RELEASE;  Surgeon: Daryll Brod, MD;  Location: [facility];  Service: Orthopedics;  Laterality: Right;  ANESTHESIA:  IV REGIONAL FAB  . COLONOSCOPY    . LEFT HEART CATH AND CORONARY ANGIOGRAPHY N/A 05/26/2018   Procedure: LEFT HEART CATH AND CORONARY ANGIOGRAPHY;  Surgeon: Martinique, Peter M, MD;  Location: [facility];  Service: Cardiovascular;  Laterality: N/A;  . REVISON OF ARTERIOVENOUS FISTULA Left 10/01/2016   Procedure: REVISON  OF LEFT ARM ARTERIOVENOUS FISTULA;  Surgeon: Angelia Mould, MD;  Location: [facility];  Service: Vascular;  Laterality: Left;  . REVISON OF ARTERIOVENOUS FISTULA Left [id number]   Procedure: PLICATION OF LEFT ARM  ARTERIOVENOUS FISTULA;  Surgeon: Angelia Mould, MD;  Location: [facility];  Service: Vascular;  Laterality: Left;   . TONSILLECTOMY    . TUMOR EXCISION  2000   right foot     OB History   No obstetric history on file.      Home Medications    Prior to Admission medications   Medication Sig Start Date End Date Taking? Authorizing Provider  acetaminophen (TYLENOL) 500 MG tablet Take 500-1,000 mg by mouth every 8 (eight) hours as needed for headache.     [provider]  aspirin EC 81 MG tablet Take 81 mg by mouth daily.    [provider]  atorvastatin (LIPITOR) 10 MG tablet Take 10 mg by mouth See admin instructions. Take 10 mg by mouth after dialysis only on Mon/Wed/Fri    [provider]  calcitRIOL (ROCALTROL) 0.5 MCG capsule Take 1 mcg by mouth daily. 05/12/18   [provider]  cycloSPORINE modified (GENGRAF) 25 MG capsule Take 100 mg by mouth See admin instructions. Take 100 mg by mouth at 8 AM and 100 mg at 8 PM    [provider]  docusate sodium (COLACE) 100 MG capsule Take 100 mg by mouth 2 (two) times daily as needed for mild constipation.    [provider]  EPIPEN 2-PAK 0.3 MG/0.3ML SOAJ injection Inject 0.3 mg into the muscle once as needed (AS DIRECTED).  08/21/18   [provider]  ferric citrate (AURYXIA) 1 GM 210 MG(Fe) tablet Take 210-630 mg by mouth See admin instructions. Take up to 630 mg by mouth with each meal and 210-420 mg with each protein-containing snack    [provider]  hydroxychloroquine (PLAQUENIL) 200 MG tablet Take 400 mg by mouth daily.     [provider]  magnesium oxide (MAG-OX) 400 MG tablet Take 400 mg by mouth See admin instructions. Take 400 mg by mouth three times a day, with dose 2/3 being held until after dialysis only on Mon/Wed/Fri    [provider]  metoprolol tartrate (LOPRESSOR) 50 MG tablet Take 25 mg by mouth 2 (two) times daily. Take 25 mg by mouth two times a day, with dose 2/2 being held until after dialysis only on Mon/Wed/Fri    [provider]   multivitamin (RENA-VIT) TABS tablet Take 1 tablet by mouth at bedtime. 05/28/18   Theora Gianotti, NP  mycophenolate (MYFORTIC) 180 MG EC tablet Take 360 mg by mouth See admin instructions. Take 360 mg by mouth at 8 AM and 360 mg at 8 PM    [provider]  omeprazole (PRILOSEC) 20 MG capsule Take 20 mg by mouth daily before breakfast.     [provider]  ondansetron (ZOFRAN-ODT) 8 MG disintegrating tablet Take 8 mg by mouth every 8 (eight) hours as needed for nausea or vomiting (DISSOLVE IN THE MOUTH).     [provider]  polyethylene glycol powder (GLYCOLAX/MIRALAX) 17 GM/SCOOP powder Take 17 g by mouth daily as needed for mild constipation (MIXED INTO WATER AS DIRECTED).    [provider]  predniSONE (DELTASONE) 5 MG tablet Take 5 mg by mouth daily with breakfast.    [provider]  rOPINIRole (REQUIP) 0.25 MG tablet Take 0.25 mg by mouth  at bedtime.    [provider]  sulfamethoxazole-trimethoprim (BACTRIM) 400-80 MG tablet Take 1 tablet by mouth See admin instructions. Take 1 tablet by mouth every Mon/Wed/Fri after dialysis    [provider]  ULTOMIRIS 300 MG/30ML SOLN injection Inject 2,400 mg into the vein every 8 (eight) weeks.  05/09/18   [provider]  valGANciclovir (VALCYTE) 450 MG tablet Take 450 mg by mouth See admin instructions. Take 450 mg by mouth every Mon/Wed/Fri after dialysis    [provider]    Family History Family History  Problem Relation Age of Onset  . Hypertension Mother   . Cervical cancer Mother   . Cancer Mother   . Hyperlipidemia Mother   . Hypertension Father   . Stroke Father   . Hyperlipidemia Father   . CAD Father        at age 34  . Valvular heart disease Father        TAVR at age 87  . Healthy Sister   . Healthy Brother   . Rheum arthritis Other     Social History Social History   Tobacco Use  . Smoking status: Never Smoker  . Smokeless tobacco:  Never Used  Substance Use Topics  . Alcohol use: No  . Drug use: No     Allergies   Patient has no known allergies.   Review of Systems Review of Systems  Constitutional:       Per HPI, otherwise negative  HENT:       Per HPI, otherwise negative  Respiratory:       Per HPI, otherwise negative  Cardiovascular:       Per HPI, otherwise negative  Gastrointestinal: Negative for vomiting.  Endocrine:       Negative aside from HPI  Genitourinary:       Neg aside from HPI   Musculoskeletal:       Per HPI, otherwise negative  Skin: Negative.   Allergic/Immunologic: Positive for immunocompromised state.  Neurological: Negative for syncope.     Physical Exam Updated Vital Signs BP (!) 133/98 (BP Location: Right Arm)   Pulse (!) 118   Temp 97.6 F (36.4 C) (Oral)   Resp 17   SpO2 100%   Physical Exam Vitals signs and nursing note reviewed.  Constitutional:      General: She is not in acute distress.    Appearance: She is well-developed.     Comments: Sickly appearing adult female awake and alert slow to interact, but answering questions appropriately, briefly.  HENT:     Head: Normocephalic and atraumatic.  Eyes:     Conjunctiva/sclera: Conjunctivae normal.  Cardiovascular:     Rate and Rhythm: Regular rhythm. Tachycardia present.  Pulmonary:     Effort: Pulmonary effort is normal. No respiratory distress.     Breath sounds: Normal breath sounds. No stridor.  Abdominal:     General: There is no distension.  Musculoskeletal:     Comments: Left upper extremity graft with palpable thrill  Skin:    General: Skin is warm and dry.  Neurological:     Mental Status: She is alert and oriented to person, place, and time.     Cranial Nerves: No cranial nerve deficit.     Motor: Atrophy present. No weakness.  Psychiatric:        Behavior: Behavior is slowed and withdrawn.      ED Treatments / Results  Labs (all labs ordered are listed, but only abnormal  results  are displayed) Labs Reviewed  COMPREHENSIVE METABOLIC PANEL - Abnormal; Notable for the following components:      Result Value   Sodium 128 (*)    Chloride 90 (*)    BUN 32 (*)    Creatinine, Ser 3.94 (*)    Total Protein 5.3 (*)    Albumin 2.7 (*)    GFR calc non Af Amer 11 (*)    GFR calc Af Amer 13 (*)    Anion gap 16 (*)    All other components within normal limits  CBC WITH DIFFERENTIAL/PLATELET - Abnormal; Notable for the following components:   WBC 13.4 (*)    RBC 3.37 (*)    Hemoglobin 10.8 (*)    HCT 35.8 (*)    MCV 106.2 (*)    RDW 19.4 (*)    Neutro Abs 10.5 (*)    Lymphs Abs 0.2 (*)    Monocytes Absolute 1.2 (*)    Abs Immature Granulocytes 1.42 (*)    All other components within normal limits  TROPONIN I (HIGH SENSITIVITY) - Abnormal; Notable for the following components:   Troponin I (High Sensitivity) 942 (*)    All other components within normal limits  TROPONIN I (HIGH SENSITIVITY) - Abnormal; Notable for the following components:   Troponin I (High Sensitivity) 990 (*)    All other components within normal limits  SARS CORONAVIRUS 2 (TAT 6-24 HRS)  ETHANOL  PROTIME-INR  RAPID URINE DRUG SCREEN, HOSP PERFORMED  URINALYSIS, COMPLETE (UACMP) WITH MICROSCOPIC  HEPARIN LEVEL (UNFRACTIONATED)  CBG MONITORING, ED    EKG EKG Interpretation  Date/Time:  Thursday November 30 2018 08:16:51 EST Ventricular Rate:  119 PR Interval:    QRS Duration: 138 QT Interval:  367 QTC Calculation: 517 R Axis:   -78 Text Interpretation: Sinus or ectopic atrial tachycardia IVCD, consider atypical RBBB LVH with secondary repolarization abnormality ST-t wave abnormality with elevations anteriorly, depressions laterally Abnormal ekg Confirmed by Carmin Muskrat [phone]) on 11/05/2018 8:28:37 AM   Radiology Ct Head Wo Contrast  Result Date: 11/08/2018 CLINICAL DATA:  Altered level of consciousness.  Confusion EXAM: CT HEAD WITHOUT CONTRAST TECHNIQUE: Contiguous axial  images were obtained from the base of the skull through the vertex without intravenous contrast. COMPARISON:  MRI head 09/19/2018 FINDINGS: Brain: Image quality degraded by motion. Negative for acute infarct, hemorrhage, or mass.  No midline shift. Vascular: Negative for hyperdense vessel Skull: Negative Sinuses/Orbits: Negative Other: None IMPRESSION: No acute abnormality.  Mild atrophy. Motion degraded study. Electronically Signed   By: Franchot Gallo M.D.   On: 11/14/2018 10:05   Dg Chest Port 1 View  Result Date: 11/29/2018 CLINICAL DATA:  Altered mental status EXAM: PORTABLE CHEST 1 VIEW COMPARISON:  10/30/2018 FINDINGS: Normal mediastinum and cardiac silhouette. Normal pulmonary vasculature. Fine linear interstitial pattern noted primarily in the RIGHT upper lobe. No pneumothorax. No evidence of effusion, infiltrate, or pneumothorax. No acute bony abnormality. IMPRESSION: 1. No acute cardiopulmonary process. 2. Probable mild interstitial edema. 3. No overt pulmonary edema.  No evidence pneumonia. Electronically Signed   By: Suzy Bouchard M.D.   On: 11/06/2018 09:02    Procedures Procedures (including critical care time)  CRITICAL CARE Performed by: Carmin Muskrat Total critical care time: 40 minutes Critical care time was exclusive of separately billable procedures and treating other patients. Critical care was necessary to treat or prevent imminent or life-threatening deterioration. Critical care was time spent personally by me on the following activities: development of treatment  plan with patient and/or surrogate as well as nursing, discussions with consultants, evaluation of patient's response to treatment, examination of patient, obtaining history from patient or surrogate, ordering and performing treatments and interventions, ordering and review of laboratory studies, ordering and review of radiographic studies, pulse oximetry and re-evaluation of patient's condition.   Medications  Ordered in ED Medications  0.9 %  sodium chloride infusion ( Intravenous New Bag/Given 11/29/2018 1457)  heparin ADULT infusion 100 units/mL (25000 units/269mL sodium chloride 0.45%) (600 Units/hr Intravenous New Bag/Given 11/14/2018 1500)  LORazepam (ATIVAN) injection 1 mg (has no administration in time range)  heparin bolus via infusion 2,700 Units (2,700 Units Intravenous Bolus from Bag 11/26/2018 1501)     Initial Impression / Assessment and Plan / ED Course  I have reviewed the triage vital signs and the nursing notes.  Pertinent labs & imaging results that were available during my care of the patient were reviewed by me and considered in my medical decision making (see chart for details).  Immediately after the patient's arrival, with consideration of abnormal EKG I discussed her case with her cardiologist on-call. Patient found to have some ST wave changes, code STEMI not activated, but labs pending.  In particular we discussed the patient's catheterization from earlier this year with unremarkable coronary arteries, and her current absence of any complaints at all.    Chart review after the initial evaluation notable for cardiac catheterization May, 2020, with results as below: 1. Normal coronary anatomy 2. Low LVEDP 8 mm Hg.    Additional chart review notable for MRI within the past 3 months, results as below: IMPRESSION:    MRI brain (without) demonstrating: - Few scattered periventricular and subcortical foci of non-specific gliosis.  - No acute findings.  Update: Initial troponin elevated.  Patient does have history of elevated troponin, and atrial tachycardia, was one of the possible explanations for this in the past. She subsequently had catheterization with unremarkable coronary arteries, as above earlier this year.    10:29 AM Patient in similar condition, no complaints  3:39 PM Patient in similar condition. With concern for hyponatremia, persistent renal dysfunction,  considerations of a stroke versus medication effect versus other causes for her acute encephalopathy she will require admission.  I discussed the patient's case with our neurology colleagues, cardiology colleagues, she has started heparin drip given her 2 elevated troponin, with positive delta. However, the patient has had catheterization without occlusion, MRI without stroke within the past 6 months, both reassuring.  Final Clinical Impressions(s) / ED Diagnoses   Final diagnoses:  Encephalopathy     Carmin Muskrat, MD 11/05/2018 1541

## 2018-11-30 NOTE — Progress Notes (Signed)
Patient received to room 5W31 at 1950 accompanied by ED RN. Skin check performed with Mercer Pod RN at bedside. Patient is on progressive care monitor [id number].  Verified with Langley Adie RN. Patient's daughter, Charlies Silvers, at bedside [phone]. Patient drowsy at arrival, oriented to self.  Respirations free and easy, diminished breath sounds noted.  Patient unable to deep breath or follow instructions to cough when prompted. Patient's skin is very dry and intact, scabbing noted on right forearm from previous injury. Patient is left arm restrict due to AV fistula.  Bruit and thrill noted. IV access lost at time of admission to unit.  IV team consulted.  New vascular access site obtained. Lab attempted x2 to draw patient's blood work, lab unable to obtain access. Unable to perform dysphagia screening, patient currently NPO. Stat EKG performed at 2224.   Attempted to orient patient to room and unit, no evidence of learning noted.   DX: altered mental status

## 2018-11-30 NOTE — ED Notes (Signed)
Verified Heparin Infusion Rate

## 2018-12-01 ENCOUNTER — Other Ambulatory Visit: Payer: Self-pay

## 2018-12-01 ENCOUNTER — Inpatient Hospital Stay (HOSPITAL_COMMUNITY): Payer: Medicare Other

## 2018-12-01 ENCOUNTER — Encounter (HOSPITAL_COMMUNITY): Payer: Self-pay | Admitting: Nephrology

## 2018-12-01 ENCOUNTER — Inpatient Hospital Stay (HOSPITAL_COMMUNITY): Payer: Medicare Other | Admitting: Registered Nurse

## 2018-12-01 DIAGNOSIS — L8990 Pressure ulcer of unspecified site, unspecified stage: Secondary | ICD-10-CM | POA: Insufficient documentation

## 2018-12-01 DIAGNOSIS — R404 Transient alteration of awareness: Secondary | ICD-10-CM | POA: Diagnosis not present

## 2018-12-01 DIAGNOSIS — R579 Shock, unspecified: Secondary | ICD-10-CM | POA: Diagnosis not present

## 2018-12-01 DIAGNOSIS — Z992 Dependence on renal dialysis: Secondary | ICD-10-CM

## 2018-12-01 DIAGNOSIS — I10 Essential (primary) hypertension: Secondary | ICD-10-CM

## 2018-12-01 DIAGNOSIS — R778 Other specified abnormalities of plasma proteins: Secondary | ICD-10-CM | POA: Diagnosis not present

## 2018-12-01 DIAGNOSIS — I361 Nonrheumatic tricuspid (valve) insufficiency: Secondary | ICD-10-CM

## 2018-12-01 DIAGNOSIS — I469 Cardiac arrest, cause unspecified: Secondary | ICD-10-CM | POA: Diagnosis not present

## 2018-12-01 DIAGNOSIS — R0603 Acute respiratory distress: Secondary | ICD-10-CM

## 2018-12-01 DIAGNOSIS — N186 End stage renal disease: Secondary | ICD-10-CM | POA: Diagnosis not present

## 2018-12-01 DIAGNOSIS — I442 Atrioventricular block, complete: Secondary | ICD-10-CM

## 2018-12-01 DIAGNOSIS — I420 Dilated cardiomyopathy: Secondary | ICD-10-CM

## 2018-12-01 DIAGNOSIS — R000 Tachycardia, unspecified: Secondary | ICD-10-CM

## 2018-12-01 LAB — ACETAMINOPHEN LEVEL: Acetaminophen (Tylenol), Serum: <10 ug/mL — ABNORMAL LOW (ref 10–30)

## 2018-12-01 LAB — POCT I-STAT EG7
Acid-base deficit: 11 mmol/L — ABNORMAL HIGH (ref 0.0–2.0)
Acid-base deficit: 15 mmol/L — ABNORMAL HIGH (ref 0.0–2.0)
Bicarbonate: 17.1 mmol/L — ABNORMAL LOW (ref 20.0–28.0)
Bicarbonate: 11.1 mmol/L — ABNORMAL LOW (ref 20.0–28.0)
Calcium, Ion: 0.96 mmol/L — ABNORMAL LOW (ref 1.15–1.40)
Calcium, Ion: 0.97 mmol/L — ABNORMAL LOW (ref 1.15–1.40)
HCT: 34 % — ABNORMAL LOW (ref 36.0–46.0)
HCT: 33 % — ABNORMAL LOW (ref 36.0–46.0)
Hemoglobin: 11.6 g/dL — ABNORMAL LOW (ref 12.0–15.0)
Hemoglobin: 11.2 g/dL — ABNORMAL LOW (ref 12.0–15.0)
O2 Saturation: 66 %
O2 Saturation: 85 %
Patient temperature: 100.9
Patient temperature: 100.3
Potassium: 5.1 mmol/L (ref 3.5–5.1)
Potassium: 5.5 mmol/L — ABNORMAL HIGH (ref 3.5–5.1)
Sodium: 124 mmol/L — ABNORMAL LOW (ref 135–145)
Sodium: 124 mmol/L — ABNORMAL LOW (ref 135–145)
TCO2: 18 mmol/L — ABNORMAL LOW (ref 22–32)
TCO2: 12 mmol/L — ABNORMAL LOW (ref 22–32)
pCO2, Ven: 47.1 mmHg (ref 44.0–60.0)
pCO2, Ven: 28.8 mmHg — ABNORMAL LOW (ref 44.0–60.0)
pH, Ven: 7.174 — CL (ref 7.250–7.430)
pH, Ven: 7.198 — CL (ref 7.250–7.430)
pO2, Ven: 46 mmHg — ABNORMAL HIGH (ref 32.0–45.0)
pO2, Ven: 63 mmHg — ABNORMAL HIGH (ref 32.0–45.0)

## 2018-12-01 LAB — BASIC METABOLIC PANEL
Anion gap: 17 — ABNORMAL HIGH (ref 5–15)
BUN: 31 mg/dL — ABNORMAL HIGH (ref 8–23)
BUN: 9 mg/dL (ref 8–23)
CO2: <7 mmol/L — ABNORMAL LOW (ref 22–32)
CO2: 23 mmol/L (ref 22–32)
Calcium: 5.6 mg/dL — CL (ref 8.9–10.3)
Calcium: 8 mg/dL — ABNORMAL LOW (ref 8.9–10.3)
Chloride: 112 mmol/L — ABNORMAL HIGH (ref 98–111)
Chloride: 96 mmol/L — ABNORMAL LOW (ref 98–111)
Creatinine, Ser: 3.04 mg/dL — ABNORMAL HIGH (ref 0.44–1.00)
Creatinine, Ser: 1.9 mg/dL — ABNORMAL HIGH (ref 0.44–1.00)
GFR calc Af Amer: 18 mL/min — ABNORMAL LOW (ref >60)
GFR calc Af Amer: 32 mL/min — ABNORMAL LOW (ref >60)
GFR calc non Af Amer: 16 mL/min — ABNORMAL LOW (ref >60)
GFR calc non Af Amer: 27 mL/min — ABNORMAL LOW (ref >60)
Glucose, Bld: 28 mg/dL — CL (ref 70–99)
Glucose, Bld: 71 mg/dL (ref 70–99)
Potassium: 3.7 mmol/L (ref 3.5–5.1)
Potassium: 3.7 mmol/L (ref 3.5–5.1)
Sodium: 138 mmol/L (ref 135–145)
Sodium: 136 mmol/L (ref 135–145)

## 2018-12-01 LAB — POCT I-STAT 7, (LYTES, BLD GAS, ICA,H+H)
Acid-base deficit: 19 mmol/L — ABNORMAL HIGH (ref 0.0–2.0)
Bicarbonate: 8.5 mmol/L — ABNORMAL LOW (ref 20.0–28.0)
Calcium, Ion: 0.99 mmol/L — ABNORMAL LOW (ref 1.15–1.40)
HCT: 36 % (ref 36.0–46.0)
Hemoglobin: 12.2 g/dL (ref 12.0–15.0)
O2 Saturation: 100 %
Patient temperature: 98.2
Potassium: 5.6 mmol/L — ABNORMAL HIGH (ref 3.5–5.1)
Sodium: 126 mmol/L — ABNORMAL LOW (ref 135–145)
TCO2: 9 mmol/L — ABNORMAL LOW (ref 22–32)
pCO2 arterial: 24 mmHg — ABNORMAL LOW (ref 32.0–48.0)
pH, Arterial: 7.155 — CL (ref 7.350–7.450)
pO2, Arterial: 319 mmHg — ABNORMAL HIGH (ref 83.0–108.0)

## 2018-12-01 LAB — CBC
HCT: 33.6 % — ABNORMAL LOW (ref 36.0–46.0)
HCT: 24.9 % — ABNORMAL LOW (ref 36.0–46.0)
Hemoglobin: 10.7 g/dL — ABNORMAL LOW (ref 12.0–15.0)
Hemoglobin: 7.8 g/dL — ABNORMAL LOW (ref 12.0–15.0)
MCH: 32.5 pg (ref 26.0–34.0)
MCH: 32.5 pg (ref 26.0–34.0)
MCHC: 31.8 g/dL (ref 30.0–36.0)
MCHC: 31.3 g/dL (ref 30.0–36.0)
MCV: 102.1 fL — ABNORMAL HIGH (ref 80.0–100.0)
MCV: 103.8 fL — ABNORMAL HIGH (ref 80.0–100.0)
Platelets: 272 10*3/uL (ref 150–400)
Platelets: 182 10*3/uL (ref 150–400)
RBC: 3.29 MIL/uL — ABNORMAL LOW (ref 3.87–5.11)
RBC: 2.4 MIL/uL — ABNORMAL LOW (ref 3.87–5.11)
RDW: 19 % — ABNORMAL HIGH (ref 11.5–15.5)
RDW: 19 % — ABNORMAL HIGH (ref 11.5–15.5)
WBC: 23.9 10*3/uL — ABNORMAL HIGH (ref 4.0–10.5)
WBC: 14.5 10*3/uL — ABNORMAL HIGH (ref 4.0–10.5)
nRBC: 0.7 % — ABNORMAL HIGH (ref 0.0–0.2)
nRBC: 0.8 % — ABNORMAL HIGH (ref 0.0–0.2)

## 2018-12-01 LAB — COMPREHENSIVE METABOLIC PANEL
ALT: 1386 U/L — ABNORMAL HIGH (ref 0–44)
AST: 2761 U/L — ABNORMAL HIGH (ref 15–41)
Albumin: 2.4 g/dL — ABNORMAL LOW (ref 3.5–5.0)
Alkaline Phosphatase: 83 U/L (ref 38–126)
Anion gap: 35 — ABNORMAL HIGH (ref 5–15)
BUN: 44 mg/dL — ABNORMAL HIGH (ref 8–23)
CO2: 8 mmol/L — ABNORMAL LOW (ref 22–32)
Calcium: 8.8 mg/dL — ABNORMAL LOW (ref 8.9–10.3)
Chloride: 91 mmol/L — ABNORMAL LOW (ref 98–111)
Creatinine, Ser: 5.15 mg/dL — ABNORMAL HIGH (ref 0.44–1.00)
GFR calc Af Amer: 10 mL/min — ABNORMAL LOW (ref >60)
GFR calc non Af Amer: 8 mL/min — ABNORMAL LOW (ref >60)
Glucose, Bld: 117 mg/dL — ABNORMAL HIGH (ref 70–99)
Potassium: 5.7 mmol/L — ABNORMAL HIGH (ref 3.5–5.1)
Sodium: 134 mmol/L — ABNORMAL LOW (ref 135–145)
Total Bilirubin: 2.4 mg/dL — ABNORMAL HIGH (ref 0.3–1.2)
Total Protein: 4.9 g/dL — ABNORMAL LOW (ref 6.5–8.1)

## 2018-12-01 LAB — GLUCOSE, CAPILLARY
Glucose-Capillary: 334 mg/dL — ABNORMAL HIGH (ref 70–99)
Glucose-Capillary: 105 mg/dL — ABNORMAL HIGH (ref 70–99)
Glucose-Capillary: 114 mg/dL — ABNORMAL HIGH (ref 70–99)
Glucose-Capillary: 91 mg/dL (ref 70–99)
Glucose-Capillary: 75 mg/dL (ref 70–99)
Glucose-Capillary: 70 mg/dL (ref 70–99)
Glucose-Capillary: 97 mg/dL (ref 70–99)

## 2018-12-01 LAB — MAGNESIUM
Magnesium: 2.1 mg/dL (ref 1.7–2.4)
Magnesium: 1.8 mg/dL (ref 1.7–2.4)
Magnesium: 1.7 mg/dL (ref 1.7–2.4)

## 2018-12-01 LAB — TSH: TSH: 0.668 u[IU]/mL (ref 0.350–4.500)

## 2018-12-01 LAB — ECHOCARDIOGRAM COMPLETE
Height: 64 in
Weight: 1668.44 oz

## 2018-12-01 LAB — HEPATIC FUNCTION PANEL
ALT: 591 U/L — ABNORMAL HIGH (ref 0–44)
AST: 985 U/L — ABNORMAL HIGH (ref 15–41)
Albumin: 2.5 g/dL — ABNORMAL LOW (ref 3.5–5.0)
Alkaline Phosphatase: 90 U/L (ref 38–126)
Bilirubin, Direct: 0.5 mg/dL — ABNORMAL HIGH (ref 0.0–0.2)
Indirect Bilirubin: 0.6 mg/dL (ref 0.3–0.9)
Total Bilirubin: 1.1 mg/dL (ref 0.3–1.2)
Total Protein: 5.2 g/dL — ABNORMAL LOW (ref 6.5–8.1)

## 2018-12-01 LAB — FOLATE RBC
Folate, Hemolysate: 276 ng/mL
Folate, RBC: 2208 ng/mL (ref >498)
Hematocrit: 12.5 % — CL (ref 34.0–46.6)

## 2018-12-01 LAB — HEPATITIS PANEL, ACUTE
HCV Ab: NONREACTIVE
Hep A IgM: NONREACTIVE
Hep B C IgM: NONREACTIVE
Hepatitis B Surface Ag: NONREACTIVE

## 2018-12-01 LAB — LACTIC ACID, PLASMA
Lactic Acid, Venous: >11 mmol/L (ref 0.5–1.9)
Lactic Acid, Venous: 4 mmol/L (ref 0.5–1.9)

## 2018-12-01 LAB — PROTIME-INR
INR: 1.8 — ABNORMAL HIGH (ref 0.8–1.2)
Prothrombin Time: 20.6 seconds — ABNORMAL HIGH (ref 11.4–15.2)

## 2018-12-01 LAB — AMMONIA: Ammonia: 140 umol/L — ABNORMAL HIGH (ref 9–35)

## 2018-12-01 LAB — C-REACTIVE PROTEIN: CRP: 8.5 mg/dL — ABNORMAL HIGH (ref <1.0)

## 2018-12-01 LAB — PHOSPHORUS
Phosphorus: 8.3 mg/dL — ABNORMAL HIGH (ref 2.5–4.6)
Phosphorus: 4.7 mg/dL — ABNORMAL HIGH (ref 2.5–4.6)
Phosphorus: 3.7 mg/dL (ref 2.5–4.6)

## 2018-12-01 LAB — HIV ANTIBODY (ROUTINE TESTING W REFLEX): HIV Screen 4th Generation wRfx: NONREACTIVE

## 2018-12-01 LAB — TROPONIN I (HIGH SENSITIVITY)
Troponin I (High Sensitivity): 1936 ng/L (ref <18)
Troponin I (High Sensitivity): 4355 ng/L (ref <18)
Troponin I (High Sensitivity): 7503 ng/L (ref <18)

## 2018-12-01 LAB — HEPARIN LEVEL (UNFRACTIONATED)
Heparin Unfractionated: <0.1 IU/mL — ABNORMAL LOW (ref 0.30–0.70)
Heparin Unfractionated: <0.1 IU/mL — ABNORMAL LOW (ref 0.30–0.70)

## 2018-12-01 LAB — D-DIMER, QUANTITATIVE: D-Dimer, Quant: >20 ug/mL-FEU — ABNORMAL HIGH (ref 0.00–0.50)

## 2018-12-01 LAB — RPR: RPR Ser Ql: NONREACTIVE

## 2018-12-01 LAB — TYPE AND SCREEN
ABO/RH(D): O POS
Antibody Screen: NEGATIVE

## 2018-12-01 LAB — VITAMIN B12: Vitamin B-12: 1886 pg/mL — ABNORMAL HIGH (ref 180–914)

## 2018-12-01 LAB — MRSA PCR SCREENING: MRSA by PCR: NEGATIVE

## 2018-12-01 LAB — BETA-HYDROXYBUTYRIC ACID: Beta-Hydroxybutyric Acid: 0.64 mmol/L — ABNORMAL HIGH (ref 0.05–0.27)

## 2018-12-01 LAB — SEDIMENTATION RATE: Sed Rate: 13 mm/hr (ref 0–22)

## 2018-12-01 MED ORDER — ORAL CARE MOUTH RINSE
15.0000 mL | OROMUCOSAL | Status: DC
  Administered 2018-12-01 – 2018-12-07 (×58): 15 mL via OROMUCOSAL

## 2018-12-01 MED ORDER — PANTOPRAZOLE SODIUM 40 MG IV SOLR
40.0000 mg | Freq: Every day | INTRAVENOUS | Status: DC
  Administered 2018-12-01 – 2018-12-07 (×7): 40 mg via INTRAVENOUS
  Filled 2018-12-01 (×7): qty 40

## 2018-12-01 MED ORDER — SODIUM CHLORIDE 0.9 % IV SOLN
2.0000 g | INTRAVENOUS | Status: DC
  Administered 2018-12-01: 2 g via INTRAVENOUS
  Filled 2018-12-01: qty 2

## 2018-12-01 MED ORDER — FENTANYL CITRATE (PF) 100 MCG/2ML IJ SOLN
50.0000 ug | INTRAMUSCULAR | Status: DC | PRN
  Administered 2018-12-01 – 2018-12-05 (×6): 50 ug via INTRAVENOUS
  Filled 2018-12-01 (×6): qty 2

## 2018-12-01 MED ORDER — DEXTROSE 50 % IV SOLN
INTRAVENOUS | Status: AC
  Administered 2018-12-01: 25 g via INTRAVENOUS
  Filled 2018-12-01: qty 50

## 2018-12-01 MED ORDER — PREDNISONE 5 MG/5ML PO SOLN
5.0000 mg | Freq: Every day | ORAL | Status: DC
  Filled 2018-12-01: qty 5

## 2018-12-01 MED ORDER — CHLORHEXIDINE GLUCONATE 0.12% ORAL RINSE (MEDLINE KIT)
15.0000 mL | Freq: Two times a day (BID) | OROMUCOSAL | Status: DC
  Administered 2018-12-01 – 2018-12-07 (×13): 15 mL via OROMUCOSAL

## 2018-12-01 MED ORDER — LACTULOSE 10 GM/15ML PO SOLN
20.0000 g | Freq: Three times a day (TID) | ORAL | Status: DC
  Administered 2018-12-01: 20 g via ORAL
  Filled 2018-12-01: qty 30

## 2018-12-01 MED ORDER — ALBUTEROL SULFATE (2.5 MG/3ML) 0.083% IN NEBU: 2.5000 mg | INHALATION_SOLUTION | RESPIRATORY_TRACT | Status: DC | PRN

## 2018-12-01 MED ORDER — POLYETHYLENE GLYCOL 3350 17 G PO PACK: 17.0000 g | PACK | Freq: Every day | ORAL | Status: DC | PRN

## 2018-12-01 MED ORDER — CALCIUM GLUCONATE-NACL 1-0.675 GM/50ML-% IV SOLN
1.0000 g | Freq: Once | INTRAVENOUS | Status: AC
  Administered 2018-12-01: 1000 mg via INTRAVENOUS
  Filled 2018-12-01: qty 50

## 2018-12-01 MED ORDER — VITAL 1.5 CAL PO LIQD
1000.0000 mL | ORAL | Status: DC
  Administered 2018-12-01 – 2018-12-07 (×5): 1000 mL
  Filled 2018-12-01 (×8): qty 1000

## 2018-12-01 MED ORDER — DOCUSATE SODIUM 50 MG/5ML PO LIQD: 100.0000 mg | Freq: Every day | ORAL | Status: DC | PRN

## 2018-12-01 MED ORDER — SODIUM BICARBONATE 8.4 % IV SOLN
50.0000 meq | Freq: Once | INTRAVENOUS | Status: AC
  Administered 2018-12-01: 50 meq via INTRAVENOUS
  Filled 2018-12-01: qty 50

## 2018-12-01 MED ORDER — SODIUM BICARBONATE 8.4 % IV SOLN
INTRAVENOUS | Status: AC
  Filled 2018-12-01: qty 50

## 2018-12-01 MED ORDER — SODIUM CHLORIDE 0.9 % IV SOLN
INTRAVENOUS | Status: DC | PRN
  Administered 2018-12-01: 500 mL via INTRAVENOUS
  Administered 2018-12-04: 250 mL via INTRAVENOUS

## 2018-12-01 MED ORDER — VALGANCICLOVIR HCL 50 MG/ML PO SOLR
450.0000 mg | ORAL | Status: DC
  Administered 2018-12-01: 450 mg via ORAL
  Filled 2018-12-01 (×3): qty 9

## 2018-12-01 MED ORDER — PRO-STAT SUGAR FREE PO LIQD
30.0000 mL | Freq: Every day | ORAL | Status: DC
  Administered 2018-12-01 – 2018-12-07 (×7): 30 mL
  Filled 2018-12-01 (×7): qty 30

## 2018-12-01 MED ORDER — DEXTROSE 50 % IV SOLN
25.0000 g | INTRAVENOUS | Status: AC
  Administered 2018-12-01: 03:00:00 25 g via INTRAVENOUS

## 2018-12-01 MED ORDER — SODIUM CHLORIDE 0.9 % IV SOLN
2.0000 g | Freq: Once | INTRAVENOUS | Status: AC
  Administered 2018-12-01: 2 g via INTRAVENOUS
  Filled 2018-12-01: qty 2

## 2018-12-01 MED ORDER — NOREPINEPHRINE 4 MG/250ML-% IV SOLN
INTRAVENOUS | Status: AC
  Administered 2018-12-01: 5 ug/min via INTRAVENOUS
  Filled 2018-12-01: qty 250

## 2018-12-01 MED ORDER — EPINEPHRINE HCL 5 MG/250ML IV SOLN IN NS
0.5000 ug/min | INTRAVENOUS | Status: DC
  Administered 2018-12-01: 5 ug/min via INTRAVENOUS

## 2018-12-01 MED ORDER — SODIUM BICARBONATE 8.4 % IV SOLN
50.0000 meq | Freq: Once | INTRAVENOUS | Status: AC
  Administered 2018-12-01: 50 meq via INTRAVENOUS

## 2018-12-01 MED ORDER — SODIUM BICARBONATE 8.4 % IV SOLN
50.0000 meq | Freq: Once | INTRAVENOUS | Status: AC
  Administered 2018-12-01: 05:00:00 50 meq via INTRAVENOUS

## 2018-12-01 MED ORDER — SODIUM BICARBONATE 8.4 % IV SOLN
INTRAVENOUS | Status: AC
  Administered 2018-12-01: 50 meq via INTRAVENOUS
  Filled 2018-12-01: qty 50

## 2018-12-01 MED ORDER — ASPIRIN 81 MG PO CHEW
81.0000 mg | CHEWABLE_TABLET | Freq: Every day | ORAL | Status: DC
  Administered 2018-12-01 – 2018-12-07 (×7): 81 mg
  Filled 2018-12-01 (×8): qty 1

## 2018-12-01 MED ORDER — FENTANYL 2500MCG IN NS 250ML (10MCG/ML) PREMIX INFUSION
0.0000 ug/h | INTRAVENOUS | Status: DC
  Administered 2018-12-01: 50 ug/h via INTRAVENOUS
  Administered 2018-12-02 – 2018-12-03 (×2): 200 ug/h via INTRAVENOUS
  Filled 2018-12-01 (×3): qty 250

## 2018-12-01 MED ORDER — NOREPINEPHRINE 4 MG/250ML-% IV SOLN
0.0000 ug/min | INTRAVENOUS | Status: DC
  Administered 2018-12-01: 10:00:00 5 ug/min via INTRAVENOUS
  Administered 2018-12-04: 0.75 ug/min via INTRAVENOUS
  Filled 2018-12-01: qty 250

## 2018-12-01 MED ORDER — SULFAMETHOXAZOLE-TRIMETHOPRIM 200-40 MG/5ML PO SUSP
10.0000 mL | ORAL | Status: DC
  Administered 2018-12-01 – 2018-12-06 (×3): 10 mL
  Filled 2018-12-01 (×3): qty 10

## 2018-12-01 MED ORDER — IOHEXOL 350 MG/ML SOLN
75.0000 mL | Freq: Once | INTRAVENOUS | Status: AC | PRN
  Administered 2018-12-01: 23:00:00 75 mL via INTRAVENOUS

## 2018-12-01 MED ORDER — FENTANYL CITRATE (PF) 100 MCG/2ML IJ SOLN
50.0000 ug | Freq: Once | INTRAMUSCULAR | Status: AC
  Administered 2018-12-01: 03:00:00 50 ug via INTRAVENOUS

## 2018-12-01 MED ORDER — VANCOMYCIN HCL IN DEXTROSE 500-5 MG/100ML-% IV SOLN
500.0000 mg | INTRAVENOUS | Status: DC
  Administered 2018-12-01: 500 mg via INTRAVENOUS
  Filled 2018-12-01: qty 100

## 2018-12-01 MED ORDER — CYCLOSPORINE MODIFIED (NEORAL) 100 MG/ML PO SOLN
75.0000 mg | Freq: Two times a day (BID) | ORAL | Status: DC
  Administered 2018-12-01 – 2018-12-06 (×11): 75 mg
  Filled 2018-12-01 (×12): qty 0.8

## 2018-12-01 MED ORDER — VANCOMYCIN HCL IN DEXTROSE 1-5 GM/200ML-% IV SOLN
1000.0000 mg | Freq: Once | INTRAVENOUS | Status: AC
  Administered 2018-12-01: 1000 mg via INTRAVENOUS
  Filled 2018-12-01: qty 200

## 2018-12-01 MED ORDER — HEPARIN BOLUS VIA INFUSION
1300.0000 [IU] | Freq: Once | INTRAVENOUS | Status: AC
  Administered 2018-12-01: 1300 [IU] via INTRAVENOUS
  Filled 2018-12-01: qty 1300

## 2018-12-01 MED ORDER — B COMPLEX-C PO TABS
1.0000 | ORAL_TABLET | Freq: Every day | ORAL | Status: DC
  Administered 2018-12-01 – 2018-12-07 (×7): 1
  Filled 2018-12-01 (×7): qty 1

## 2018-12-01 MED ORDER — MAGNESIUM SULFATE 4 GM/100ML IV SOLN
4.0000 g | Freq: Once | INTRAVENOUS | Status: AC
  Administered 2018-12-01: 4 g via INTRAVENOUS
  Filled 2018-12-01: qty 100

## 2018-12-01 MED ORDER — CYCLOSPORINE 25 MG PO CAPS: 75.0000 mg | ORAL_CAPSULE | Freq: Two times a day (BID) | ORAL | Status: DC

## 2018-12-01 MED ORDER — FENTANYL CITRATE (PF) 100 MCG/2ML IJ SOLN
INTRAMUSCULAR | Status: AC
  Administered 2018-12-01: 50 ug via INTRAVENOUS
  Filled 2018-12-01: qty 2

## 2018-12-01 MED FILL — Medication: Qty: 1 | Status: AC

## 2018-12-01 NOTE — Progress Notes (Signed)
Pharmacy Antibiotic Note  Erin Morgan is a 63 y.o. female admitted on 12/03/2018 with AMS and one week of weakness. Pharmacy has been consulted for vancomycin and cefepime dosing for sepsis.  Patient failed recent kidney transplant and is getting dialysis MWF.  To get HD today.  Tmax 100.9, WBC up 23.9, LA > 11.  Plan: Vanc 500mg  IV qHD MWF Cefepime 2gm IV qHD MWF Monitor HD schedule/tolerance, clinical progress, vanc level as indicated  Height: 5\' 4"  (162.6 cm) Weight: 104 lb 4.4 oz (47.3 kg) IBW/kg (Calculated) : 54.7  Temp (24hrs), Avg:99.6 F (37.6 C), Min:97.5 F (36.4 C), Max:100.9 F (38.3 C)  Recent Labs  Lab 11/08/2018 0917 12/01/18 0144 12/01/18 0217 12/01/18 0304 12/01/18 0800 12/01/18 0810  WBC 13.4* 14.5* 23.9*  --   --   --   CREATININE 3.94* 3.04*  --  4.95*  --  5.15*  LATICACIDVEN  --   --   --   --  >11.0*  --     Estimated Creatinine Clearance: 8.2 mL/min (A) (by C-G formula based on SCr of 5.15 mg/dL (H)).    No Known Allergies  Vanc 11/27 >> Cefepime 11/27 >>  11/26  covid - negative 11/27 MRSA PCR - negative 11/27 BCx -   Mikena Masoner D. Mina Marble, PharmD, BCPS, [place] 12/01/2018, 9:48 AM

## 2018-12-01 NOTE — Progress Notes (Addendum)
NAME:  Erin Morgan, MRN:  [id number], DOB:  [birth year], LOS: 1 ADMISSION DATE:  11/11/2018, CONSULTATION DATE:  12/01/2018 REFERRING MD:  Dr. Doristine Bosworth CHIEF COMPLAINT:  Cardiac arrest  Brief History   This is a 63 year old woman with history of end-stage renal disease, with a recent admission for symptomatic anemia requiring transfusion.  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  She subsequently developed graft failure and is now back on dialysis.  She was having weakness and lethargy at home, and did complete her full run of dialysis on Tuesday.  She was admitted on 11/26 for weakness and lethargy.  Shortly after admission she developed cardiac arrest with asystole and received ACLS protocol for 11 minutes.  She was intubated and was subsequently bradycardic, for which she was placed on a dopamine infusion with minimal benefit.  She was then transcutaneously paced.  When neither of these work she was transitioned to an epinephrine drip.  She was admitted to the 23M ICU and PCCM was consulted for further management.  Consults:  Nephrology  Procedures:  11/27 Intubation  Micro Data:  Blood cultures 11/26 drawn and pending  Antimicrobials:  11/27 cefepime>> 11/27 vancomycin>>  Interim history/subjective:  She maintains on epinephrine drip at 4 mcgs per minute.  She is intubated, minimally sedated, and has increased respiratory rate and work of breathing. She is awake and follows commands.   Objective   Blood pressure (!) 140/105, pulse 87, temperature (!) 97.5 F (36.4 C), temperature source Axillary, resp. rate (!) 33, height 5\' 4"  (1.626 m), weight 47.3 kg, SpO2 100 %.    Vent Mode: PRVC FiO2 (%):  [60 %-100 %] 60 % Set Rate:  [18 bmp-26 bmp] 18 bmp Vt Set:  [430 mL-440 mL] 430 mL PEEP:  [5 cmH20] 5 cmH20 Plateau Pressure:  [15 cmH20-17 cmH20] 15 cmH20   Intake/Output Summary (Last 24  hours) at 12/01/2018 0831 Last data filed at 12/01/2018 0800 Gross per 24 hour  Intake 3087.04 ml  Output -  Net 3087.04 ml   Filed Weights   11/05/2018 2000 12/01/18 0500  Weight: 47.4 kg 47.3 kg    Examination: General: tachynpic, intubated HENT: at/[place], mmm, ett in place Lungs: increased wob, lungs are clear, no wheezes or crackles Cardiovascular: tachycardic, regular, no mrg Abdomen: scaphoid, soft Extremities: LUE AVF with palpable thrill and bruit.  Neuro: Follows commands, moves all 4 extremities, denies pain.  MSK: no rashes or acute synovitis Lines: OG, left femoral A-line, right femoral CVC   Assessment & Plan:  Erin Morgan is a 63 year old with a history of end-stage renal disease status post recent failed kidney transplant at [facility] in September 2020, who presents with:  Cardiac arrest Bradycardia, Asystole.  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 Less likely to be acute coronary syndrome.  Start tube feeds HD today - needs clearance of acidosis Stop epi due to worsening lactic acidosis. Norepinephrine if another agent is needed.  Continue vanc cefepime for now empirically for now. Follow up blood cultures Discussed with nephrology team.   Addendum: Discussed with cardiology team, there is concern for myocarditis as the cause of her cardiac arrest based  on EKGs and clinical history.  Plan is for cardiac MRI.  Best practice:  Diet: NPO, will start tube feeds Pain/Anxiety/Delirium protocol (if indicated): fentanyl prn VAP protocol (if indicated): ordered. DVT prophylaxis: heparin gtt GI prophylaxis: PPI Glucose  control: Controlled, under 180 Foley anuric Mobility: bed rest Code Status: Full Code Family Communication: Will call mother today. Disposition: needs ICU  Labs   CBC: Recent Labs  Lab 11/24/2018 0917 12/01/18 0136 12/01/18 0144 12/01/18 0217 12/01/18 0245 12/01/18 0520  WBC 13.4*  --  14.5* 23.9*  --   --   NEUTROABS 10.5*  --   --   --   --   --   HGB 10.8* 11.6* 7.8* 10.7* 11.2* 12.2  HCT 35.8* 34.0* 24.9* 33.6* 33.0* 36.0  MCV 106.2*  --  103.8* 102.1*  --   --   PLT 337  --  182 272  --   --     Basic Metabolic Panel: Recent Labs  Lab 11/24/2018 0917 12/01/18 0106 12/01/18 0136 12/01/18 0144 12/01/18 0245 12/01/18 0304 12/01/18 0520  NA 128*  --  124* 138 124* 132* 126*  K 4.9  --  5.1 3.7 5.5* 5.5* 5.6*  CL 90*  --   --  112*  --  90*  --   CO2 22  --   --  <7*  --  11*  --   GLUCOSE 99  --   --  28*  --  179*  --   BUN 32*  --   --  31*  --  42*  --   CREATININE 3.94*  --   --  3.04*  --  4.95*  --   CALCIUM 9.4  --   --  5.6*  --  8.5*  --   MG  --  2.1  --   --   --   --   --   PHOS  --  8.3*  --   --   --   --   --    GFR: Estimated Creatinine Clearance: 8.6 mL/min (A) (by C-G formula based on SCr of 4.95 mg/dL (H)). Recent Labs  Lab 11/24/2018 0917 12/01/18 0144 12/01/18 0217  WBC 13.4* 14.5* 23.9*    Liver Function Tests: Recent Labs  Lab 11/07/2018 0917 12/01/18 0106 12/01/18 0304  AST 37 985* 1,664*  ALT 44 591* 962*  ALKPHOS 62 90 73  BILITOT 1.1 1.1 1.5*  PROT 5.3* 5.2* 4.6*  ALBUMIN 2.7* 2.5* 2.4*   No results for input(s): LIPASE, AMYLASE in the last 168 hours. Recent Labs  Lab 12/01/18 0106  AMMONIA 140*    ABG    Component Value Date/Time   PHART 7.155 (LL) 12/01/2018 0520   PCO2ART 24.0 (L) 12/01/2018 0520   PO2ART 319.0 (H) 12/01/2018 0520   HCO3 8.5 (L) 12/01/2018 0520   TCO2 9 (L) 12/01/2018 0520   ACIDBASEDEF 19.0 (H) 12/01/2018 0520   O2SAT 100.0 12/01/2018 0520     Coagulation Profile: Recent Labs  Lab  11/07/2018 0917 12/01/18 0217  INR 1.0 1.8*    Cardiac Enzymes: No results for input(s): CKTOTAL, CKMB, CKMBINDEX, TROPONINI in the last 168 hours.  HbA1C: No results found for: HGBA1C  CBG: Recent Labs  Lab 11/12/2018 0928 12/01/18 0242 12/01/18 0520 12/01/18 0804  GLUCAP 82 334* 105* 114*    Imaging: Based on OSH reports: TTE showed mildly reduced left ventricular systolic function with LVEF 45%. Of note, LHC at OSH 05/2018 showed  nonobstructive CAD per documentation    Critical care time:   The patient is critically ill with multiple organ systems failure and requires high complexity decision making for assessment and support, frequent evaluation and titration of therapies, application of advanced monitoring technologies and extensive interpretation of multiple databases.   Critical Care Time devoted to patient care services described in this note is 57 minutes. This time reflects the time of my personal involvement. This critical care time does not reflect separately billable procedures or procedure time, teaching time or supervisory time of PA/NP/Med student/Med Resident etc but could involve care discussion time.  [facility] 12/01/2018 8:31 AM  Pager: [phone] After hours pager: [phone]

## 2018-12-01 NOTE — Procedures (Signed)
Arterial Catheter Insertion Procedure Note Erin Morgan [id number] 09-08-1955  Procedure: Insertion of Arterial Catheter  Indications: Blood pressure monitoring  Procedure Details Consent: Unable to obtain consent because of altered level of consciousness. Time Out: Verified patient identification, verified procedure, site/side was marked, verified correct patient position, special equipment/implants available, medications/allergies/relevent history reviewed, required imaging and test results available.  Performed  Maximum sterile technique was used including antiseptics, cap, gloves, gown, hand hygiene, mask and sheet. Skin prep: Chlorhexidine; local anesthetic administered 20 gauge catheter was inserted into left femoral artery using the Seldinger technique. ULTRASOUND GUIDANCE USED: YES Evaluation Blood flow good; BP tracing good. Complications: No apparent complications.   Georgann Housekeeper, AGACNP-BC [place]  See Amion for personal pager PCCM on call pager [phone]  12/01/2018 5:11 AM

## 2018-12-01 NOTE — Progress Notes (Addendum)
Progress Note  Patient Name: Erin Morgan Date of Encounter: 12/01/2018  Primary Cardiologist: Mertie Moores, MD   Subjective   Sedated and intubated.  Inpatient Medications    Scheduled Meds: . aspirin  81 mg Per Tube Daily  . B-complex with vitamin C  1 tablet Per Tube Daily  . chlorhexidine gluconate (MEDLINE KIT)  15 mL Mouth Rinse BID  . Chlorhexidine Gluconate Cloth  6 each Topical Daily  . feeding supplement (PRO-STAT SUGAR FREE 64)  30 mL Per Tube Daily  . mouth rinse  15 mL Mouth Rinse 10 times per day  . pantoprazole (PROTONIX) IV  40 mg Intravenous Daily  . sodium bicarbonate      . vancomycin  500 mg Intravenous Q M,W,F-HD   Continuous Infusions: . sodium chloride Stopped (12/01/18 0609)  . ceFEPime (MAXIPIME) IV    . feeding supplement (VITAL 1.5 CAL)    . heparin 600 Units/hr (12/01/18 0600)  . norepinephrine (LEVOPHED) Adult infusion 5 mcg/min (12/01/18 0942)   PRN Meds: sodium chloride, acetaminophen **OR** acetaminophen, albuterol, docusate, fentaNYL (SUBLIMAZE) injection, polyethylene glycol   Vital Signs    Vitals:   12/01/18 0800 12/01/18 0815 12/01/18 0830 12/01/18 0845  BP:      Pulse: 87 88 89 97  Resp: (!) 33 (!) 29 (!) 34 (!) 29  Temp:      TempSrc:      SpO2: 100% 100% 100% 100%  Weight:      Height:        Intake/Output Summary (Last 24 hours) at 12/01/2018 1058 Last data filed at 12/01/2018 0800 Gross per 24 hour  Intake 3102.04 ml  Output -  Net 3102.04 ml   Last 3 Weights 12/01/2018 11/13/2018 08/15/2018  Weight (lbs) 104 lb 4.4 oz 104 lb 8 oz 103 lb 8 oz  Weight (kg) 47.3 kg 47.4 kg 46.947 kg  Some encounter information is confidential and restricted. Go to Review Flowsheets activity to see all data.      Telemetry    Difficult to assess underlying rhythm (difficult to appreciated P waves). Possibly idioventricular rhythm vs sinus rhythm with LBBB. Did have episode of complete heart block yesterday. - Personally  Reviewed  ECG    Sinus rhythm vs ectopic atrial rhythm with LBBB - Personally Reviewed  Physical Exam   Physical Exam per MD:  GEN: Intubated and sedated. Neck: Supple. Cardiac: RRR.  Respiratory: Clear to auscultation bilaterally. GI: Soft, nontender, non-distended.  MS: No edema; No deformity. Skin: Lower extremities slightly cool to the touch. Neuro:  Intubated and sedated. Psych: Intubated and sedated.  Labs    High Sensitivity Troponin:   Recent Labs  Lab 11/08/2018 0917 11/25/2018 1035 12/01/18 0106 12/01/18 0945  TROPONINIHS 942* 990* 1,936* 4,355*      Chemistry Recent Labs  Lab 12/01/18 0106  12/01/18 0144  12/01/18 0304 12/01/18 0520 12/01/18 0810  NA  --    < > 138   < > 132* 126* 134*  K  --    < > 3.7   < > 5.5* 5.6* 5.7*  CL  --   --  112*  --  90*  --  91*  CO2  --   --  <7*  --  11*  --  8*  GLUCOSE  --   --  28*  --  179*  --  117*  BUN  --   --  31*  --  42*  --  44*  CREATININE  --   --  3.04*  --  4.95*  --  5.15*  CALCIUM  --   --  5.6*  --  8.5*  --  8.8*  PROT 5.2*  --   --   --  4.6*  --  4.9*  ALBUMIN 2.5*  --   --   --  2.4*  --  2.4*  AST 985*  --   --   --  1,664*  --  2,761*  ALT 591*  --   --   --  962*  --  1,386*  ALKPHOS 90  --   --   --  73  --  83  BILITOT 1.1  --   --   --  1.5*  --  2.4*  GFRNONAA  --   --  16*  --  9*  --  8*  GFRAA  --   --  18*  --  10*  --  10*  ANIONGAP  --   --  NOT CALCULATED  --  31*  --  35*   < > = values in this interval not displayed.     Hematology Recent Labs  Lab 11/15/2018 0917  12/01/18 0144 12/01/18 0217 12/01/18 0245 12/01/18 0520  WBC 13.4*  --  14.5* 23.9*  --   --   RBC 3.37*  --  2.40* 3.29*  --   --   HGB 10.8*   < > 7.8* 10.7* 11.2* 12.2  HCT 35.8*   < > 24.9* 33.6* 33.0* 36.0  MCV 106.2*  --  103.8* 102.1*  --   --   MCH 32.0  --  32.5 32.5  --   --   MCHC 30.2  --  31.3 31.8  --   --   RDW 19.4*  --  19.0* 19.0*  --   --   PLT 337  --  182 272  --   --    < > =  values in this interval not displayed.    BNPNo results for input(s): BNP, PROBNP in the last 168 hours.   DDimer No results for input(s): DDIMER in the last 168 hours.   Radiology    Ct Head Wo Contrast  Result Date: 12/01/2018 CLINICAL DATA:  Altered mental status, dilated pupils, new coagulopathy, possible PE a arrest secondary to hypoxia or hypoglycemia EXAM: CT HEAD WITHOUT CONTRAST TECHNIQUE: Contiguous axial images were obtained from the base of the skull through the vertex without intravenous contrast. COMPARISON:  CT, MR 11/12/2018 FINDINGS: Brain: No evidence of acute infarction, hemorrhage, hydrocephalus, extra-axial collection or mass lesion/mass effect. Symmetric prominence of the ventricles, cisterns and sulci compatible with parenchymal volume loss. Patchy areas of white matter hypoattenuation are most compatible with chronic microvascular angiopathy. Vascular: Atherosclerotic calcification of the carotid siphons and intradural vertebral arteries. No hyperdense vessel. Skull: No calvarial fracture or suspicious osseous lesion. No scalp swelling or hematoma. Sinuses/Orbits: Paranasal sinuses and mastoid air cells are predominantly clear. Included orbital structures are unremarkable. Other: Patient intubated at this time with endotracheal and transesophageal tubes. IMPRESSION: 1. No acute intracranial abnormality. 2. Stable parenchymal atrophy and chronic microvascular angiopathy. Electronically Signed   By: Lovena Le M.D.   On: 12/01/2018 06:56   Ct Head Wo Contrast  Result Date: 11/11/2018 CLINICAL DATA:  Altered level of consciousness.  Confusion EXAM: CT HEAD WITHOUT CONTRAST TECHNIQUE: Contiguous axial images were obtained from the base of the skull through the vertex without  intravenous contrast. COMPARISON:  MRI head 09/19/2018 FINDINGS: Brain: Image quality degraded by motion. Negative for acute infarct, hemorrhage, or mass.  No midline shift. Vascular: Negative for  hyperdense vessel Skull: Negative Sinuses/Orbits: Negative Other: None IMPRESSION: No acute abnormality.  Mild atrophy. Motion degraded study. Electronically Signed   By: Franchot Gallo M.D.   On: 11/22/2018 10:05   Mr Brain Wo Contrast (neuro Protocol)  Result Date: 11/25/2018 CLINICAL DATA:  Altered level of consciousness (LOC), unexplained. Additional history provided: Generalized weakness and fatigue for 1 week. Confusion. EXAM: MRI HEAD WITHOUT CONTRAST TECHNIQUE: Multiplanar, multiecho pulse sequences of the brain and surrounding structures were obtained without intravenous contrast. COMPARISON:  Head CT 11/12/2018, brain MRI 09/19/2018 FINDINGS: Brain: There is no evidence of acute infarct. No evidence of intracranial mass. No midline shift or extra-axial fluid collection. No chronic intracranial blood products. Mild scattered T2/FLAIR hyperintensity within the cerebral white matter is nonspecific, but consistent with chronic small vessel ischemic disease. Mild generalized parenchymal atrophy. Vascular: Flow voids maintained within the proximal large arterial vessels. Skull and upper cervical spine: No focal marrow lesion. Sinuses/Orbits: Visualized orbits demonstrate no acute abnormality. No significant paranasal sinus disease or mastoid effusion. IMPRESSION: 1. No evidence of acute intracranial abnormality. 2. Mild generalized parenchymal atrophy and chronic small vessel ischemic disease. Electronically Signed   By: Kellie Simmering DO   On: 11/25/2018 18:42   Dg Chest Port 1 View  Result Date: 12/01/2018 CLINICAL DATA:  Status post intubation EXAM: PORTABLE CHEST 1 VIEW COMPARISON:  12/01/2018 FINDINGS: Endotracheal tube is noted 4.6 cm above the carina. Gastric catheter is noted coiled within the stomach. Cardiac shadow is accentuated by the portable technique. The lungs are mildly hyperinflated without focal infiltrate or effusion. No bony abnormality is noted. IMPRESSION: Endotracheal tube and  nasogastric catheter in satisfactory position. No acute abnormality noted. Electronically Signed   By: Inez Catalina M.D.   On: 12/01/2018 02:43   Dg Chest Port 1 View  Result Date: 12/01/2018 CLINICAL DATA:  Respiratory distress EXAM: PORTABLE CHEST 1 VIEW COMPARISON:  11/10/2018 at 8:40 a.m. FINDINGS: Mild cardiomegaly. No focal airspace consolidation or pulmonary edema. Diffuse interstitial coarsening. No pleural effusion or pneumothorax. IMPRESSION: No active disease. Electronically Signed   By: Ulyses Jarred M.D.   On: 12/01/2018 00:24   Dg Chest Port 1 View  Result Date: 11/28/2018 CLINICAL DATA:  Altered mental status EXAM: PORTABLE CHEST 1 VIEW COMPARISON:  10/30/2018 FINDINGS: Normal mediastinum and cardiac silhouette. Normal pulmonary vasculature. Fine linear interstitial pattern noted primarily in the RIGHT upper lobe. No pneumothorax. No evidence of effusion, infiltrate, or pneumothorax. No acute bony abnormality. IMPRESSION: 1. No acute cardiopulmonary process. 2. Probable mild interstitial edema. 3. No overt pulmonary edema.  No evidence pneumonia. Electronically Signed   By: Suzy Bouchard M.D.   On: 11/10/2018 09:02    Cardiac Studies   Echocardiogram 12/01/2018: Impressions:  1. Left ventricular ejection fraction, by visual estimation, is 45 to 50%. The left ventricle has mildly decreased function. There is moderately increased left ventricular hypertrophy.  2. Left ventricular diastolic parameters are consistent with Grade III diastolic dysfunction (restrictive).  3. Global right ventricle has normal systolic function.The right ventricular size is normal.  4. Left atrial size was moderately dilated.  5. Right atrial size was normal.  6. Moderate mitral annular calcification.  7. The mitral valve is normal in structure. Trace mitral valve regurgitation. No evidence of mitral stenosis.  8. The tricuspid valve is normal in structure.  Tricuspid valve regurgitation is mild.  9.  The aortic valve is tricuspid. Aortic valve regurgitation is not visualized. Mild aortic valve stenosis. 10. The pulmonic valve was normal in structure. Pulmonic valve regurgitation is trivial. 11. Mildly elevated pulmonary artery systolic pressure. 12. The inferior vena cava is dilated in size with >50% respiratory variability, suggesting right atrial pressure of 8 mmHg. 13. Mild global reduction in LV systolic function; restrictive filling; moderate LVH; myocardium with speckled appearance (consider amyloid); mild AS (mean gradient 14 mmHg); moderate LAE; mild TR.  Patient Profile     63 y.o. female with a history of normal coronaries on cardiac catheterization in 07/261, grade 2 diastolic dysfunction, ectopic atrial tachycardia followed by EP, hypertension, ESRD on hemodialysis, rheumatoid arthritis who presented with altered mental status felt to likely by secondary to acute metabolic encephalopathy. Cardiology consulted for elevated troponin.  Assessment & Plan    Elevated Troponin - High-sensitivity elevated at 942 >> 990 and then jumped to 1,936 after PEA arrest last night. Repeat troponin pending. - Most recent EKG showed normal sinus rhythm vs possible ectopic atrial rhythm with LBBB.  - GNormal coronaries on cath in 05/2018. - Echo shows LVEF of 45-50% with grade 3 diastolic dysfunction.  RV size and systolic function normal. Myocardium with speckled appearance. - Currently on Heparin drip. Continue for now given concern for PE. - Continue Aspirin. - Etiology of troponin elevation unclear at this time. Possible due to PE given hypoxia and PEA arrest. Myocarditis is also a possibility. Will order CRP and sed rate although suspect these will be elevated due to ESRD and rheumatoid arthritis. Do not think we can get cardiac MRI due to ESRD.  Bradycardic/PEA Arrest and CHB - Patient had bradycardic/PEA arrest in setting of new onset hypoxemia and hypoglycemia. Patient was intubated and  required 11 minutes of CPR before ROSC was obtained. She was given Atropine and Dopmaine with little improvement and briefly required external pacing while Epinephrine infusion was started. - Telemetry reviewed with Dr. Radford Pax. Difficult to assess underlying rhythm and difficult to appreciate P waves a times. However, she did have an episode of complete heart block last night. Will ask EP to see for further recommendations. - Recommend work-up for PE. Will start with D-dimer though suspect this to be elevated. - Continue to monitor on telemetry.  History of Atrial Tachycardia - Previously on Diltiazem but this was held on recent admission due to soft BP and was started on Lopressor 9m twice daily instead.  - Given episode of bradycardia last night, would hold AV nodal agents for now and continue to monitor on telemetry. - Followed by EP as outpatient.   Altered mental Status - Head CT benign. - WBC up to 23.9 this morning so possible infectious etiology. Blood cultures pending. - Management per primary team.  Leukocytosis - WBC elevated at 13.4 >> 14.5 >> 23.9. - Lactic acid > 11.0 - Blood cultures pending.  - COVID negative. - Management per primary team.  Elevated Transaminases - Hepatitis panel unremarkable. - Likely shock liver. - Management per primary team.  ESRD  - Management per Nephrology.  Otherwise, per primary team.  For questions or updates, please contact [place]Please consult [email or web address] for contact info under        Signed, CDarreld Mclean PA-C  12/01/2018, 10:58 AM    Addendum:  Repeat troponin came back in the 4,000s. Spoke with Dr. TRadford Pax Will plan for cardiac MRI today following dialysis for evaluation of  myocarditis.  Darreld Mclean, PA-C 12/01/2018 11:08 AM Pager: [phone]   Addendum:  Dr. Radford Pax spoke with Dr. Johnsie Cancel who recommended against cardiac MRI given ESRD. Dr. Radford Pax has asked CHF team (Dr. Haroldine Laws) to see.   Darreld Mclean, PA-C 12/01/2018 11:15 AM Pager: [phone]

## 2018-12-01 NOTE — Progress Notes (Signed)
Initial Nutrition Assessment  DOCUMENTATION CODES:   Underweight  INTERVENTION:   Tube Feeding:  Vital 1.5 at 40 ml/hr Pro-Stat 30 mL daily Provides 1540 kcals, 80 g of protein and 730 mL of free water  Add B-complex with C; transition to Rena-Vit once able to take po   NUTRITION DIAGNOSIS:   Inadequate oral intake related to acute illness as evidenced by NPO status.  GOAL:   Patient will meet greater than or equal to 90% of their needs  MONITOR:   Vent status, Skin, TF tolerance, Weight trends, Labs  REASON FOR ASSESSMENT:   Ventilator, Consult Enteral/tube feeding initiation and management  ASSESSMENT:   63 yo female admitted with AMS with hx of generalized weakness and fatigue x 1 week, cardiac arrest on 11/27 requiring intubation. PMH includes ESRD on HD, s/p failed renal transplant 09/2018, CHF   RD working remotely.  11/26 Admit 11/27 Cardiac arrest, Intubated  Patient is currently intubated on ventilator support MV: 13.7 L/min Temp (24hrs), Avg:99.6 F (37.6 C), Min:97.5 F (36.4 C), Max:100.9 F (38.3 C)  Propofol: none  Current wt 47.3 kg; unsure of outpatient EDW. Net + 3 L per I/O flow sheet  Labs: sodium 134, potassium 5.7 (H), ionized calcium 0.99 (L) Meds: reviewed   Diet Order:   Diet Order            Diet NPO time specified  Diet effective now              EDUCATION NEEDS:   Not appropriate for education at this time  Skin:  Skin Assessment: Skin Integrity Issues: Skin Integrity Issues:: Stage I Stage I: buttock  Last BM:  11/27  Height:   Ht Readings from Last 1 Encounters:  12/01/18 5\' 4"  (1.626 m)    Weight:   Wt Readings from Last 1 Encounters:  12/01/18 47.3 kg    BMI:  Body mass index is 17.9 kg/m.  Estimated Nutritional Needs:   Kcal:  1582 kcals  Protein:  71-94 g  Fluid:  1000 mL plus UOP   [employer] MS, RDN, LDN, CNSC [phone] Pager  [phone] Weekend/On-Call Pager

## 2018-12-01 NOTE — Progress Notes (Signed)
Had been called earlier to room to get an ABG on patient.  Was told she was a hard stick.  Attempted twice then called Deboraha Sprang, RRT who attempted twice.  Attempts were unsuccessful.  Lorriane Shire, RN called Anesthesia to see if they could assist getting a line in.  Chrys Racer, CRNA came and had just got a line in patient when the patient went agonal.  Chrys Racer, CRNA started compressions while I went to get AMBU bag and informed another RN that we needed to get code cart to room 31.  High quality CPR was done on patient until ROSC was returned and during this procedure, Chrys Racer, CRNA placed an 7.0ETT in patient.  Patient was transferred to 86M to be placed on ventilator, was bagged up to floor.  This RT transferred care to [facility], RRT via report before transport.

## 2018-12-01 NOTE — Progress Notes (Signed)
SLP Cancellation Note  Patient Details Name: Erin Morgan MRN: [id number] DOB: 14-Oct-1955   Cancelled evaluation:    Patient not medically ready. Pt coded and now intubated. Will monitor for readiness for eval.       [name], [place] CCC/SLP Acute Rehabilitation Services Office number [phone] Pager [phone]  [name] 12/01/2018, 8:51 AM

## 2018-12-01 NOTE — Progress Notes (Addendum)
Attempted to call patient's daughter at [phone] at [place] then a [phone].  Voice mail not set up, unable to leave message.

## 2018-12-01 NOTE — Progress Notes (Signed)
  Echocardiogram 2D Echocardiogram has been performed.  Erin Morgan 12/01/2018, 9:12 AM

## 2018-12-01 NOTE — Progress Notes (Signed)
[place] Progress Note Patient Name: NORMAN KOLER DOB: 04-10-1955 MRN: [id number]   Date of Service  12/01/2018  HPI/Events of Note  Patient went into asystolic cardiac arrest on the general ward, ACLS protocol for asystole x 11 minutes, Pt intubated and transferred to # M where she was externally paced, and placed on a Dopamine infusion for profound bradycardia.  eICU Interventions  New patient evaluation completed.        Kerry Kass Ogan 12/01/2018, 2:15 AM

## 2018-12-01 NOTE — Progress Notes (Signed)
OT Cancellation Note  Patient Details Name: Erin Morgan MRN: [id number] DOB: Aug 01, 1955   Cancelled Treatment:    Reason Eval/Treat Not Completed: Medical issues which prohibited therapy. Pt now with code earlier this AM, intubated and with right femoral line. Will follow for appropriateness for eval.  [place], OTR/L Acute Rehab Services Pager [phone] Office [phone]     Almon Register 12/01/2018, 7:31 AM

## 2018-12-01 NOTE — Progress Notes (Signed)
ANTICOAGULATION CONSULT NOTE  Pharmacy Consult:  Heparin Indication: ACS   Patient Measurements: Height: 5\' 4"  (162.6 cm) Weight: 106 lb 0.7 oz (48.1 kg) IBW/kg (Calculated) : 54.7 Heparin Dosing Weight: 45 kg  Vital Signs: Temp: 97.6 F (36.4 C) (11/27 1125) Temp Source: Axillary (11/27 1125) BP: 122/55 (11/27 1400) Pulse Rate: 73 (11/27 1400)  Labs: Recent Labs    11/17/2018 0917 11/05/2018 1035 12/01/18 0106  12/01/18 0144 12/01/18 0217 12/01/18 0245 12/01/18 0304 12/01/18 0520 12/01/18 0810 12/01/18 0945 12/01/18 1230  HGB 10.8*  --   --    < > 7.8* 10.7* 11.2*  --  12.2  --   --   --   HCT 35.8*  --   --    < > 24.9* 33.6* 33.0*  --  36.0  --   --   --   PLT 337  --   --   --  182 272  --   --   --   --   --   --   LABPROT 13.5  --   --   --   --  20.6*  --   --   --   --   --   --   INR 1.0  --   --   --   --  1.8*  --   --   --   --   --   --   HEPARINUNFRC  --   --  <0.10*  --   --   --   --   --   --   --   --  <0.10*  CREATININE 3.94*  --   --   --  3.04*  --   --  4.95*  --  5.15*  --   --   TROPONINIHS 942* 990* 1,936*  --   --   --   --   --   --   --  4,355*  --    < > = values in this interval not displayed.    Estimated Creatinine Clearance: 8.4 mL/min (A) (by C-G formula based on SCr of 5.15 mg/dL (H)).   Assessment: 63 yo female with ESRD on HD - due to failed kidney transplant. Pt is admitted due to altered mental status. Heparin infusion has been ordered due to elevated troponin, which is likely due to demand ischemia from cardiac arrest per Cards.  Heparin level is undetectable.  No issue with heparin infusion nor bleeding per RN.  Goal of Therapy:  Heparin level 0.3-0.7 units/ml Monitor platelets by anticoagulation protocol: Yes   Plan:  Rebolus with heparin 1300 units IV x 1, then Increase heparin gtt to 750 units/hr Check 8 hr heparin level Daily heparin level and CBC F/U LOT  Larwence Tu D. Mina Marble, PharmD, BCPS, [place] 12/01/2018, 2:21  PM

## 2018-12-01 NOTE — Consult Note (Signed)
ELECTROPHYSIOLOGY CONSULT NOTE    Primary Care Physician: Aretta Nip, MD Referring Physician:  Dr Radford Pax  Admit Date: 11/19/2018  Reason for consultation:  AV block  Erin Morgan is a 63 y.o. female with a h/o end stage renal disease, HTN, and chronic muscle weakness well known to me previously for management of atrial tachycardia and LBBB.  She was admitted with weakness, lethargy, and AMS.  She was seen by Dr Harl Bowie (his note reviewed).   She was admitted for further evaluation.  AV conduction was intact at that time. Last evening, she developed progressive SOB/ hypoxia.  From notes, it appears that anesthesia was getting access for labs urgently due to her clinical deterioration.  She then developed bradycardia and arrested.  She required transient pacing.  She has regained her rhythm.  She is making slow progress.  She is presently intubated and unable to provide additional history.  Past Medical History:  Diagnosis Date  . Anemia   . Constipation   . Demand ischemia ([place])    a. 05/2018 elevated troponin in the setting of atrial tachycardia.  Catheterization: Normal coronaries.  . Ectopic atrial tachycardia ([place])    a. 05/2018-managed with diltiazem.  Marland Kitchen ESRD (end stage renal disease) ([place])    dialysis - t/th/sa- [facility]  . Family history of adverse reaction to anesthesia    mom and sister has n/v  . GERD (gastroesophageal reflux disease)   . History of blood transfusion   . Hypertension   . Muscle weakness   . Osteopenia   . PONV (postoperative nausea and vomiting)    Headache  . Rheumatoid arthritis ([place])   . Seasonal allergies   . Thoracic aortic aneurysm ([place]) 07/18/2017   4.0 cm ascending thoracic noted on CT 07/18/2017  . Wears glasses    Past Surgical History:  Procedure Laterality Date  . AV FISTULA PLACEMENT Left 11/18/2014   Procedure: BRACHIOCEPHALIC ARTERIOVENOUS (AV) FISTULA CREATION;  Surgeon: Conrad Rosemount, MD;  Location: [facility];  Service: Vascular;  Laterality: Left;  . CARPAL TUNNEL RELEASE Right 04/05/2014   Procedure: RIGHT CARPAL TUNNEL RELEASE;  Surgeon: Daryll Brod, MD;  Location: [facility];  Service: Orthopedics;  Laterality: Right;  ANESTHESIA:  IV REGIONAL FAB  . COLONOSCOPY    . LEFT HEART CATH AND CORONARY ANGIOGRAPHY N/A 05/26/2018   Procedure: LEFT HEART CATH AND CORONARY ANGIOGRAPHY;  Surgeon: Martinique, Peter M, MD;  Location: [facility] CV LAB;  Service: Cardiovascular;  Laterality: N/A;  . REVISON OF ARTERIOVENOUS FISTULA Left 10/01/2016   Procedure: REVISON OF LEFT ARM ARTERIOVENOUS FISTULA;  Surgeon: Angelia Mould, MD;  Location: [facility];  Service: Vascular;  Laterality: Left;  . REVISON OF ARTERIOVENOUS FISTULA Left 47/82/9562   Procedure: PLICATION OF LEFT ARM  ARTERIOVENOUS FISTULA;  Surgeon: Angelia Mould, MD;  Location: [facility];  Service: Vascular;  Laterality: Left;  . TONSILLECTOMY    . TUMOR EXCISION  2000   right foot    . aspirin  81 mg Per Tube Daily  . B-complex with vitamin C  1 tablet Per Tube Daily  . chlorhexidine gluconate (MEDLINE KIT)  15 mL Mouth Rinse BID  . Chlorhexidine Gluconate Cloth  6 each Topical Daily  . feeding supplement (PRO-STAT SUGAR FREE 64)  30 mL Per Tube Daily  . heparin  1,300 Units Intravenous Once  . mouth rinse  15 mL Mouth Rinse 10 times per day  . pantoprazole (PROTONIX) IV  40 mg Intravenous Daily  . sodium bicarbonate      . vancomycin  500 mg Intravenous Q M,W,F-HD   . sodium chloride 10 mL/hr at 12/01/18 1300  . ceFEPime (MAXIPIME) IV    . feeding supplement (VITAL 1.5 CAL)    . fentaNYL infusion INTRAVENOUS 50 mcg/hr (12/01/18 1305)  . heparin 600 Units/hr (12/01/18 1300)  . norepinephrine (LEVOPHED) Adult infusion 8 mcg/min (12/01/18 1300)    No Known Allergies  Social History   Socioeconomic History  . Marital status: Widowed    Spouse name: Not on file  . Number of children: 0  . Years of education: PhD   . Highest education level: Doctorate  Occupational History  . Not on file  Social Needs  . Financial resource strain: Not on file  . Food insecurity    Worry: Not on file    Inability: Not on file  . Transportation needs    Medical: Not on file    Non-medical: Not on file  Tobacco Use  . Smoking status: Never Smoker  . Smokeless tobacco: Never Used  Substance and Sexual Activity  . Alcohol use: No  . Drug use: No  . Sexual activity: Never  Lifestyle  . Physical activity    Days per week: Not on file    Minutes per session: Not on file  . Stress: Not on file  Relationships  . Social Herbalist on phone: Not on file    Gets together: Not on file    Attends religious service: Not on file    Active member of club or organization: Not on file    Attends meetings of clubs or organizations: Not on file    Relationship status: Not on file  . Intimate partner violence    Fear of current or ex partner: Not on file    Emotionally abused: Not on file    Physically abused: Not on file    Forced sexual activity: Not on file  Other Topics Concern  . Not on file  Social History Narrative   Lives at home alone.   Right-handed.   Occasional caffeine use.    Family History  Problem Relation Age of Onset  . Hypertension Mother   . Cervical cancer Mother   . Cancer Mother   . Hyperlipidemia Mother   . Hypertension Father   . Stroke Father   . Hyperlipidemia Father   . CAD Father        at age 81  . Valvular heart disease Father        TAVR at age 33  . Healthy Sister   . Healthy Brother   . Rheum arthritis Other     ROS- intubated and unable to provide  Physical Exam: Telemetry:  Bradycardic events reviewed.  Currently sinus rhythm with 1:1 AV conduction Vitals:   12/01/18 1345 12/01/18 1400 12/01/18 1415 12/01/18 1430  BP: (!) 116/55 (!) 122/55 134/61 131/60  Pulse: 73 73 74 74  Resp: '17 17 20 20  ' Temp:      TempSrc:      SpO2: 99% 100% 100% 100%   Weight:      Height:        GEN- The patient is ill appearing, intubated Head- normocephalic, atraumatic Oropharynx- ETT Neck- supple, no JVP Lungs-on vent Heart- Regular rate and rhythm  GI- soft  MS- no significant deformity or atrophy Skin- no rash or lesion Psych-intubated Neuro- intubated  EKG-  reviewed  Labs:   Lab Results  Component Value Date   WBC 23.9 (H) 12/01/2018   HGB 12.2 12/01/2018   HCT 36.0 12/01/2018   MCV 102.1 (H) 12/01/2018   PLT 272 12/01/2018    Recent Labs  Lab 12/01/18 0810  NA 134*  K 5.7*  CL 91*  CO2 8*  BUN 44*  CREATININE 5.15*  CALCIUM 8.8*  PROT 4.9*  BILITOT 2.4*  ALKPHOS 83  ALT 1,386*  AST 2,761*  GLUCOSE 117*     Echo:  EF 45%, moderate LVH, moderate LA enlargement  ASSESSMENT AND PLAN:   1. Bradycardic event Though the patient has LBBB chronically, she does not have prior AV block.  Her bradycardic event this admission occurred in the setting of acute hypoxia/ respiratory failure and is likely secondary to her respiratory event.  I am not convinced that pacing would be beneficial at this time. Diltiazem and metoprolol have been discontinued. Continue to monitor If she has further AV block/ symptomatic brady events without cause, we would consider pacing at that point.  Otherwise, I believe that pacing should be avoided as her hypoxia appears to have been the cause.  2. Atrial tachycardia Clinically stable Continue to follow conservatively  3. Anemia Per primary team  4. ESRD Per nephrology  5. Reduce EF Likely due to stunning s/p arrest I would anticipate that her EF will likely recover  6. Hypoxia Unclear etiology General cardiology to follow  EP to be available as needed this weekend General cardiology on board   Thompson Grayer, MD 12/01/2018  2:40 PM

## 2018-12-01 NOTE — Procedures (Signed)
   I was present at this dialysis session, have reviewed the session itself and made  appropriate changes Kelly Splinter MD [place] pager [phone]   12/01/2018, 2:45 PM

## 2018-12-01 NOTE — Consult Note (Signed)
NAME:  Erin Morgan, MRN:  [id number], DOB:  02-16-1955, LOS: 1 ADMISSION DATE:  11/12/2018, CONSULTATION DATE:  11/27 REFERRING MD:  Doristine Bosworth, CHIEF COMPLAINT:  Asystole  Brief History   63 year old female with ESRD admitted for acute encephalopathy and mildly elevated troponin of unclear significance.  She went into asystolic cardiac arrest and received CPR for 11 minutes.  Bradycardic after ROSC.  Patient was bradycardic and was externally paced and started on dopamine.  Switched to epinephrine and external pacing was turned off.  History of present illness   63 year old female with past medical history of ESRD on Monday Wednesday Friday, status post failed renal transplant in September [id number], diastolic congestive heart failure who was brought to the emergency department by her mother for generalized weakness and fatigue for 1 week.  Mother reported that patient appeared more confused and slower than baseline and she had a bowel movement in the bed, which she was unaware of.  At the time of admission her review of systems was negative except for increased confusion.  In the ED patient CT head was negative for acute abnormality, chest x-ray showed mild interstitial edema, COVID-19 negative, initial high-sensitivity troponin was elevated to 942 which increased to 990.  On 11/25/2018 patient was having some episodes of hypoxia and was put on supplemental oxygen.  Anesthesia was called to get vascular access and during that patient was noted to be bradycardic and then went into asystole.  CPR was initiated which continued for 11 minutes.  Patient received atropine, epinephrine and sodium bicarbonate and was brought to ICU.  She was started on dopamine and was externally paced initially.  Later dopamine was stopped and epinephrine was initiated and slowly the external pacing was weaned down and turned off.  Repeat EKG showed left bundle branch block with sinus rhythm, this was reviewed with on-call  cardiology who notes that patient had left bundle branch block in the past.  There was an area erroneous lab showing hemoglobin drop of 3 g which turned out to be lab error on repeat CBC and patient did not have any overt bleeding/melena.  Post ROSC labs: Hemoglobin stable at 10.7, INR 1.8 increased from 1.0.  Venous blood gas 7.1 7/47/46, potassium on venous blood gas 5.1.  AST 1664, ALT 964 increased from normal values on admission.  Chest x-ray shows increased interstitial markings similar to admission, no new infiltrate, ETT in appropriate position.  Bedside echo: collapsible IVC with respiration, reasonable LV function  Past Medical History    Significant Hospital Events   Asystolic cardiac arrest [id number] -11 minutes CPR with ROSC, bradycardic postarrest  Consults:  Cardiology Neurology  Procedures:  Intubation 11/27 Right femoral triple-lumen 11/27 R femoral arterial line 11/27  Significant Diagnostic Tests:  CT head on admission negative for acute findings MRI head negative for acute findings Chest x-ray increased interstitial markings, no acute infiltrate  Micro Data:  Blood culture sent on 11/27  Antimicrobials:  Vancomycin x 1 11/27 Cefepime x 1 11/27  Interim history/subjective:    Objective   Blood pressure 117/89, pulse 74, temperature (!) 100.9 F (38.3 C), temperature source Rectal, resp. rate (!) 31, height 5\' 4"  (1.626 m), weight 47.4 kg, SpO2 100 %.    Vent Mode: PRVC FiO2 (%):  [100 %] 100 % Set Rate:  [18 bmp-26 bmp] 18 bmp Vt Set:  [430 mL-440 mL] 430 mL PEEP:  [5 cmH20] 5 cmH20 Plateau Pressure:  [17 cmH20] 17 cmH20   Intake/Output Summary (Last  24 hours) at 12/01/2018 0354 Last data filed at 12/01/2018 0300 Gross per 24 hour  Intake 2156.52 ml  Output --  Net 2156.52 ml   Filed Weights   11/05/2018 2000  Weight: 47.4 kg    Examination: General: Intubated and sedated HENT: ETT in place, no obvious head or facial trauma Lungs: Clear  to auscultation bilaterally Cardiovascular: S1 plus S2 +0 Abdomen: Soft nontender nondistended Extremities: Noted pedal edema, right femoral line in place Neuro: Patient was opening eyes to commands after achieving ROSC, moving all 4 extremities GU: Deferred Psych: Unable to assess  Resolved Hospital Problem list     Assessment & Plan:  63 year old female with ESRD on Monday Wednesday Friday, history of atrial tachycardia who was admitted with confusion, acute encephalopathy of unclear etiology, mildly elevated troponin of unclear significance.  She was found to be hypoxic and quickly deteriorated into bradycardia and asystole, requiring 11 minutes of CPR with epinephrine and sodium bicarbonate.  Cardiac arrest, asystole Requires 11 minutes of CPR Was bradycardic post ROSC, initially started on dopamine and then switched to epinephrine, required external pacing temporarily Potassium 5.1 immediately after return of spontaneous circulation, no major electrolyte abnormality Venous blood gas showed pH 7.17/47 Etiology of cardiac arrest remains unclear, no chest pain prior to event, Less likely to have PE while on heparin drip, chest x-ray does not show any large opacities suggestive of aspiration Currently on epinephrine for bradycardia and hypotension, will continue to titrate for map of 65  Elevated troponin on admission Seen by cardiology, unclear significance in the setting of ESRD Repeat troponin increased following CPR Seen by cardiology after code, no need for urgent cath or placement of temp pacer  Acute encephalopathy on admission Post ROSC, patient opening eyes to verbal commands, not a candidate for hypothermia CT head on admission, unremarkable.  MRI yesterday was also unremarkable for acute pathology Ammonia 140 with new liver dysfunction, INR 1.8.  Her pupils are dilated and sluggishly reactive, will get CT head stat to rule out bleed/cerebral edema from hyperammonemia in the  setting of possible acute liver injury.  Acute liver injury Likely shock liver Transaminases is rising, INR 1.8 from baseline of 1.0 We will check Tylenol level , hepatitis panel, ammonia 140, CT head to rule out cerebral edema with hyperammonemia Lactulose 20 g 3 times daily, titrate to 2-3 soft bowel movements per day   Anion gap metabolic acidosis: Likely from lactic acidosis with poor clearance in the setting of liver injury Check lactate level stat, beta hydroxybutyrate  ESRD on HD Reportedly last dialysis was on Tuesday Do not see a nephrology note during this admission, will need to be consulted in a.m. for her routine HD  Best practice:  Diet: NPO Pain/Anxiety/Delirium protocol (if indicated): Fentanyl prn VAP protocol (if indicated):  DVT prophylaxis: heparin gtt GI prophylaxis: Protonix Glucose control:  Mobility:  Code Status: Full Family Communication: Multiple attempts made to call daughter and mom but not successful Disposition: Keep in MICU  Labs   CBC: Recent Labs  Lab 11/10/2018 0917 12/01/18 0136 12/01/18 0144 12/01/18 0217 12/01/18 0245  WBC 13.4*  --  14.5* 23.9*  --   NEUTROABS 10.5*  --   --   --   --   HGB 10.8* 11.6* 7.8* 10.7* 11.2*  HCT 35.8* 34.0* 24.9* 33.6* 33.0*  MCV 106.2*  --  103.8* 102.1*  --   PLT 337  --  182 272  --     Basic Metabolic Panel:  Recent Labs  Lab 11/11/2018 0917 12/01/18 0106 12/01/18 0136 12/01/18 0144 12/01/18 0245  NA 128*  --  124* 138 124*  K 4.9  --  5.1 3.7 5.5*  CL 90*  --   --  112*  --   CO2 22  --   --  <7*  --   GLUCOSE 99  --   --  28*  --   BUN 32*  --   --  31*  --   CREATININE 3.94*  --   --  3.04*  --   CALCIUM 9.4  --   --  5.6*  --   MG  --  2.1  --   --   --   PHOS  --  8.3*  --   --   --    GFR: Estimated Creatinine Clearance: 14 mL/min (A) (by C-G formula based on SCr of 3.04 mg/dL (H)). Recent Labs  Lab 11/25/2018 0917 12/01/18 0144 12/01/18 0217  WBC 13.4* 14.5* 23.9*    Liver  Function Tests: Recent Labs  Lab 12/01/2018 0917 12/01/18 0106  AST 37 985*  ALT 44 591*  ALKPHOS 62 90  BILITOT 1.1 1.1  PROT 5.3* 5.2*  ALBUMIN 2.7* 2.5*   No results for input(s): LIPASE, AMYLASE in the last 168 hours. Recent Labs  Lab 12/01/18 0106  AMMONIA 140*    ABG    Component Value Date/Time   HCO3 11.1 (L) 12/01/2018 0245   TCO2 12 (L) 12/01/2018 0245   ACIDBASEDEF 15.0 (H) 12/01/2018 0245   O2SAT 85.0 12/01/2018 0245     Coagulation Profile: Recent Labs  Lab 11/22/2018 0917 12/01/18 0217  INR 1.0 1.8*    Cardiac Enzymes: No results for input(s): CKTOTAL, CKMB, CKMBINDEX, TROPONINI in the last 168 hours.  HbA1C: No results found for: HGBA1C  CBG: Recent Labs  Lab 11/12/2018 0928 12/01/18 0242  GLUCAP 82 334*    Review of Systems:   Unable to obtain as patient is intubated postarrest  Past Medical History  She,  has a past medical history of Anemia, Constipation, Demand ischemia ([place]), Ectopic atrial tachycardia ([place]), ESRD (end stage renal disease) ([place]), Family history of adverse reaction to anesthesia, GERD (gastroesophageal reflux disease), History of blood transfusion, Hypertension, Muscle weakness, Osteopenia, PONV (postoperative nausea and vomiting), Rheumatoid arthritis ([place]), Seasonal allergies, Thoracic aortic aneurysm ([place]) (07/18/2017), and Wears glasses.   Surgical History    Past Surgical History:  Procedure Laterality Date   AV FISTULA PLACEMENT Left 11/18/2014   Procedure: BRACHIOCEPHALIC ARTERIOVENOUS (AV) FISTULA CREATION;  Surgeon: Conrad Belmont, MD;  Location: [facility];  Service: Vascular;  Laterality: Left;   CARPAL TUNNEL RELEASE Right 04/05/2014   Procedure: RIGHT CARPAL TUNNEL RELEASE;  Surgeon: Daryll Brod, MD;  Location: [facility];  Service: Orthopedics;  Laterality: Right;  ANESTHESIA:  IV REGIONAL FAB   COLONOSCOPY     LEFT HEART CATH AND CORONARY ANGIOGRAPHY N/A 05/26/2018   Procedure: LEFT HEART CATH AND  CORONARY ANGIOGRAPHY;  Surgeon: Martinique, Peter M, MD;  Location: [facility];  Service: Cardiovascular;  Laterality: N/A;   REVISON OF ARTERIOVENOUS FISTULA Left 10/01/2016   Procedure: REVISON OF LEFT ARM ARTERIOVENOUS FISTULA;  Surgeon: Angelia Mould, MD;  Location: [facility];  Service: Vascular;  Laterality: Left;   REVISON OF ARTERIOVENOUS FISTULA Left [id number]   Procedure: PLICATION OF LEFT ARM  ARTERIOVENOUS FISTULA;  Surgeon: Angelia Mould, MD;  Location: [facility];  Service: Vascular;  Laterality: Left;   TONSILLECTOMY     TUMOR EXCISION  2000   right foot     Social History   reports that she has never smoked. She has never used smokeless tobacco. She reports that she does not drink alcohol or use drugs.   Family History   Her family history includes CAD in her father; Cancer in her mother; Cervical cancer in her mother; Healthy in her brother and sister; Hyperlipidemia in her father and mother; Hypertension in her father and mother; Rheum arthritis in an other family member; Stroke in her father; Valvular heart disease in her father.   Allergies No Known Allergies   Home Medications  Prior to Admission medications   Medication Sig Start Date End Date Taking? Authorizing Provider  acetaminophen (TYLENOL) 500 MG tablet Take 500-1,000 mg by mouth every 8 (eight) hours as needed for headache.    Yes [provider]  aspirin EC 81 MG tablet Take 81 mg by mouth daily.   Yes [provider]  atorvastatin (LIPITOR) 10 MG tablet Take 10 mg by mouth every Monday, Wednesday, and Friday.    Yes [provider]  cycloSPORINE modified (GENGRAF) 25 MG capsule Take 75 mg by mouth 2 (two) times daily.    Yes [provider]  EPINEPHrine (EPIPEN 2-PAK) 0.3 mg/0.3 mL IJ SOAJ injection Inject 0.3 mg into the muscle once as needed for anaphylaxis.   Yes [provider]  ferric citrate (AURYXIA) 1 GM 210 MG(Fe) tablet Take 210-630 mg by  mouth 3 (three) times daily with meals. Dose is dependent on size of meal   Yes [provider]  hydroxychloroquine (PLAQUENIL) 200 MG tablet Take 200 mg by mouth daily.    Yes [provider]  magnesium oxide (MAG-OX) 400 MG tablet Take 400 mg by mouth daily as needed (constipation).    Yes [provider]  metoprolol tartrate (LOPRESSOR) 50 MG tablet Take 25 mg by mouth See admin instructions. Take 1/2 tablet (25 mg) by mouth twice daily on Sunday, Tuesday, Thursday, Saturday (non-dialysis days) and take 1/2 tablet (25 mg) in the evening on Monday, Wednesday, Friday (dialysis days)   Yes [provider]  omeprazole (PRILOSEC) 20 MG capsule Take 20 mg by mouth daily before breakfast.    Yes [provider]  ondansetron (ZOFRAN-ODT) 8 MG disintegrating tablet Take 8 mg by mouth every 8 (eight) hours as needed for nausea or vomiting.    Yes [provider]  predniSONE (DELTASONE) 5 MG tablet Take 5 mg by mouth daily with breakfast.   Yes [provider]  ravulizumab-cwvz (ULTOMIRIS) 300 MG/30ML SOLN injection Inject 2,400 mg into the vein every 8 (eight) weeks.   Yes [provider]  rOPINIRole (REQUIP) 0.25 MG tablet Take 0.25 mg by mouth at bedtime as needed (restless legs).    Yes [provider]  sulfamethoxazole-trimethoprim (BACTRIM) 400-80 MG tablet Take 1 tablet by mouth See admin instructions. Take one tablet by mouth on Monday, Wednesday, Friday after dialysis   Yes [provider]  valGANciclovir (VALCYTE) 450 MG tablet Take 450 mg by mouth See admin instructions. Take one tablet (450 mg) by mouth Monday, Wednesday, Friday after dialysis   Yes [provider]  multivitamin (RENA-VIT) TABS tablet Take 1 tablet by mouth at bedtime. 05/28/18   Theora Gianotti, NP     Critical care time: 70 min

## 2018-12-01 NOTE — Progress Notes (Signed)
   12/01/18 0056  Vitals  Cardiac Rhythm Asystole (CODE CALLED PER Cabe Lashley RN)   Patient is a very difficult IV stick.  Patient assessed by vascular access RN, unable to obtain additional vascular access. Two phlebotomy technicians attempted to obtain sample of blood, unable to obtain adequate sample. Two respiratory therapists attempted to obtain blood sample for ABG, unable to collect sample. Anesthesia on-call paged at [place] [facility] for stat IV insertion, ABG and lab draw. Anesthesia arrived to floor at [facility]. This nurse was at patient's bedside assisting [facility] CRNA with insertion of vascular access when asystole noted on monitor.  Agonal respirations noted on patient.  Patient unresponsive to stimulus or voice.  Chest compressions initiated, code blue activated.

## 2018-12-01 NOTE — Plan of Care (Signed)
I was paged by primary team due to concern for bradycardia around PEA arrest this evening. Cardiology team had been consulted previously for mild troponin elevation, noted 05/2018 cath with normal coronaries, recommended asa + heparin infusion while undergoing workup.   This evening patient with new episodic hypoxia requiring oxygen. ABG attempted but difficult stick. RT came in around 1pm for ABG and found patient agonal breathing then unresponsvie, pulseless, bradycardic on monitor. Code blue called, CPR started and patient intubated, ROSC obtained in 10-minutes. Apparently patient bradycardic after ROSC, unfortunately I cannot find telemetry strips from that time period. She was transferred to MICU, on arrival bradycardic with rates in 30's, on review of MICU telemetry it appears she was had marked sinus bradycardia with intermittent ventricular escape rhythm on arrival. She was given atropine, dopamine, with little effect, then starting externally pacing while epinephrine infusion started. On telemetry review there is heavy artifact, difficult to tell how much she was capturing, but team notes zoll showed good capture and she had good pulse. After 10-15 minutes she appeared to have good underlying rhythm so pacing stopped, was in sinus with rates 70's. ECG obtained, shows LBBB pattern similar to prior (has LBBB pattern on prior ECG's but called IVCD with consideration for atypical RBBB). Troponin from yesterday AM was 942 -> 990, no repeat was ordered. After code blue troponin was repeated, was 1,936. Of note, glucose on arrival to MICU in the 20's, patient has since been started on dextrose.    Recommendations Overall it sounds like patient had PEA arrest 2/2 hypoxia and/or hypoglycemia this evening, suspect likely had transient sinus dysfunction secondary to these issues, improved now that she is oxygenating and received dextrose. Unfortunately I cannot find strips from the floor, MICU strips appear to  show sinus dysfunction followed by 10-minutes of pacing, then back in sinus rhythm. Currently she is maintaining normal sinus rate and rhythm with LBBB pattern on ECG similar to prior. Suspect troponin elevation 2/2 demand ischemia in setting of arrest. No indication for emergent catheterization at this time. Additionally no indication for emergent pacing platform, as bradycardia likely provoked by hypoxia and hypoglycemia. If any additional issues with bradycardia that preclude weaning off epinephrine, please let us know. Otherwise, suspect issues with heart rate were secondary to her hypoxia+hypoglycemia.  Please let us know if any additional questions or concerns. Additional cardiology recommendations to follow.

## 2018-12-01 NOTE — Anesthesia Procedure Notes (Signed)
Procedure Name: Intubation Date/Time: 12/01/2018 1:03 AM Performed by: Jearld Pies, CRNA Pre-anesthesia Checklist: Patient identified, Emergency Drugs available, Suction available and Patient being monitored Patient Re-evaluated:Patient Re-evaluated prior to induction Oxygen Delivery Method: Ambu bag Preoxygenation: Pre-oxygenation with 100% oxygen Laryngoscope Size: Mac and 3 Grade View: Grade I Tube type: Oral Tube size: 7.0 mm Number of attempts: 1 Airway Equipment and Method: Stylet Placement Confirmation: ETT inserted through vocal cords under direct vision,  breath sounds checked- equal and bilateral and CO2 detector Secured at: 24 cm Tube secured with: Tape Dental Injury: Teeth and Oropharynx as per pre-operative assessment

## 2018-12-01 NOTE — Progress Notes (Signed)
Called by the bedside RN with concerns for hypoxemia. On arrival, pt saturation levels in the mid 90's. She does not appear to be in any immediate distress and her VSS are stable. At baseline, the patient is confused and a poor historian. Pt may be experiencing periods of apnea while sleeping. ABG and chest xray ordered by rapid. Continue to monitor pt closely.  Paged again at [place] for a significant event. Pt coded while CRNA at bedside attempting  to get labs. Pt moved to the ICU for closer monitoring. PCCM consulted for further management.   Lovey Newcomer, NP [facility] 7p-7a [phone]

## 2018-12-01 NOTE — Progress Notes (Signed)
PT Cancellation Note  Patient Details Name: Erin Morgan MRN: [id number] DOB: July 09, 1955   Cancelled Treatment:    Reason Eval/Treat Not Completed: Patient not medically ready. Pt coded and now intubated. Will monitor for readiness for eval.   Shary Decamp [facility] 12/01/2018, 8:24 AM [place] Pager [phone] Office [phone]

## 2018-12-01 NOTE — Progress Notes (Signed)
CRITICAL VALUE ALERT  Critical Value:  Calcium 5.6, glucose 28, CO2= <7  Date & Time Notied:  12/01/2018 @ 0300  Provider Notified: Dr Deatra Canter, MD at bedside  Orders Received/Actions taken: stat orders

## 2018-12-01 NOTE — Progress Notes (Signed)
DDimer markedly elevated .  Will get Chest CTA to rule out PE

## 2018-12-01 NOTE — Procedures (Signed)
Central Venous Catheter Insertion Procedure Note DOTTYE TANDON [id number] 12-06-1955  Procedure: Insertion of Central Venous Catheter Indications: Drug and/or fluid administration  Procedure Details Consent: Unable to obtain consent because of emergent medical necessity. Time Out: Verified patient identification, verified procedure, site/side was marked, verified correct patient position, special equipment/implants available, medications/allergies/relevent history reviewed, required imaging and test results available.  Performed  Maximum sterile technique was used including antiseptics, cap, gloves, gown, hand hygiene, mask and sheet. Skin prep: Chlorhexidine; local anesthetic administered A antimicrobial bonded/coated triple lumen catheter was placed in the right femoral vein due to patient being a dialysis patient using the Seldinger technique.  Evaluation Blood flow good Complications: No apparent complications Patient did tolerate procedure well. Chest X-ray ordered to verify placement.   Tally Due 12/01/2018, 5:32 AM

## 2018-12-01 NOTE — Consult Note (Addendum)
[facility] Renal Consultation Note    Indication for Consultation:  Management of ESRD/hemodialysis, anemia, hypertension/volume, and secondary hyperparathyroidism.  HPI: RAMIE PALLADINO is a 63 y.o. 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 She was transitioned to PD in 06/2017, then had a renal transplant which failed in 10/11/18, and is now back on HD with eventual goal of restarting PD. PMH also includes CHF, atrial tachycardia, and chronic hyponatremia. Pt presented to the ED on 11/24/2018 with altered mental status. She reportedly had generalized weakness and fatigue for about 1 week. She then began experiencing worsening confusion and  Her mother brought her to the ED for evaluation. Upon arrival patient was tachycardic, afebrile with leukocytosis of 13.4.  CT head without contrast negative, chest x-ray shows mild interstitial edema, no pneumonia.  Initial troponin elevated at 942 trended up to 990. Na 128, K+ 4.9, CO2 22, BUN 32, Cr 3.94, Ca 9.4. Cardiology recommended titration of beta blocker due to tachycardia. Shortly after arrival, pt O2 sat decreased to mid 80's requiring 6L O2. Pt then went into asystole requiring 11 minutes of CPR. Rhythm was bradycardic and patient was externally paced and intubated. Post ROSC labs: Hgb 10.7, INR 1.8, Venous blood gas 7.1, K+ 5.1, bicarb 17 >11 > 8.5. AST and ALT also elevated. CXR unchanged. Currently on heparin drip and epinephrine. Afebrile overnight to 100.59F and started on empiric vancomycin and cefepime.   Patient's last HD was 11/24. Her EDW was progressively lowered sine restarting HD due to significant edema. Notes from 11/24 state patient was tachycardic at 129 at the start of treatment and had restarted herself on diltiazem with planned cardiology follow up. Average UF about 2-2.5L.   Pt unable to  provide history, history was obtained from staff/epic/ecube.   Past Medical History:  Diagnosis Date  . Anemia   . Constipation   . Demand ischemia ([place])    a. 05/2018 elevated troponin in the setting of atrial tachycardia.  Catheterization: Normal coronaries.  . Ectopic atrial tachycardia ([place])    a. 05/2018-managed with diltiazem.  Marland Kitchen ESRD (end stage renal disease) ([place])    dialysis - t/th/sa- [facility] [place]  . Family history of adverse reaction to anesthesia    mom and sister has n/v  . GERD (gastroesophageal reflux disease)   . History of blood transfusion   . Hypertension   . Muscle weakness   . Osteopenia   . PONV (postoperative nausea and vomiting)    Headache  . Rheumatoid arthritis ([place])   . Seasonal allergies   . Thoracic aortic aneurysm ([place]) 07/18/2017   4.0 cm ascending thoracic noted on CT 07/18/2017  . Wears glasses    Past Surgical History:  Procedure Laterality Date  . AV FISTULA PLACEMENT Left 11/18/2014   Procedure: BRACHIOCEPHALIC ARTERIOVENOUS (AV) FISTULA CREATION;  Surgeon: Conrad Yellville, MD;  Location: [facility];  Service: Vascular;  Laterality: Left;  . CARPAL TUNNEL RELEASE Right 04/05/2014   Procedure: RIGHT CARPAL TUNNEL RELEASE;  Surgeon: Daryll Brod, MD;  Location: [facility];  Service: Orthopedics;  Laterality: Right;  ANESTHESIA:  IV REGIONAL FAB  . COLONOSCOPY    . LEFT HEART CATH AND CORONARY ANGIOGRAPHY N/A 05/26/2018   Procedure: LEFT HEART CATH AND CORONARY ANGIOGRAPHY;  Surgeon: Martinique, Peter M, MD;  Location: [facility];  Service: Cardiovascular;  Laterality: N/A;  . REVISON  OF ARTERIOVENOUS FISTULA Left 10/01/2016   Procedure: REVISON OF LEFT ARM ARTERIOVENOUS FISTULA;  Surgeon: Angelia Mould, MD;  Location: [place];  Service: Vascular;  Laterality: Left;  . REVISON OF ARTERIOVENOUS FISTULA Left 57/32/2025   Procedure: PLICATION OF LEFT ARM  ARTERIOVENOUS FISTULA;  Surgeon: Angelia Mould, MD;   Location: [facility];  Service: Vascular;  Laterality: Left;  . TONSILLECTOMY    . TUMOR EXCISION  2000   right foot   Family History  Problem Relation Age of Onset  . Hypertension Mother   . Cervical cancer Mother   . Cancer Mother   . Hyperlipidemia Mother   . Hypertension Father   . Stroke Father   . Hyperlipidemia Father   . CAD Father        at age 54  . Valvular heart disease Father        TAVR at age 35  . Healthy Sister   . Healthy Brother   . Rheum arthritis Other    Social History:  reports that she has never smoked. She has never used smokeless tobacco. She reports that she does not drink alcohol or use drugs.  ROS: As per HPI otherwise negative.   Physical Exam: Vitals:   12/01/18 0800 12/01/18 0815 12/01/18 0830 12/01/18 0845  BP:      Pulse: 87 88 89 97  Resp: (!) 33 (!) 29 (!) 34 (!) 29  Temp:      TempSrc:      SpO2: 100% 100% 100% 100%  Weight:      Height:         General: Intubated, thin female. Head: Normocephalic, atraumatic, sclera non-icteric, mucus membranes are moist. Neck: Supple without lymphadenopathy/masses. JVD not elevated. Lungs: On vent. Increased work of breathing. CTA anteriorly without wheezing, rhonchi or rales Heart: RRR with normal S1, S2. No murmurs, rubs, or gallops appreciated. Abdomen: Soft, non-tender, non-distended with normoactive bowel sounds. No rebound/guarding. No obvious abdominal masses. Musculoskeletal:  Thin, decreased muscle tone appreciated Lower extremities: No edema or ischemic changes b/l lower extremities Neuro: Opens eyes briefly to voice. Moves all limbs spontaeously Psych:  Responds to questions appropriately with a normal affect. Dialysis Access: LUE AVF + bruit  No Known Allergies Prior to Admission medications   Medication Sig Start Date End Date Taking? Authorizing Provider  acetaminophen (TYLENOL) 500 MG tablet Take 500-1,000 mg by mouth every 8 (eight) hours as needed for headache.    Yes [provider]  aspirin EC 81 MG tablet Take 81 mg by mouth daily.   Yes [provider]  atorvastatin (LIPITOR) 10 MG tablet Take 10 mg by mouth every Monday, Wednesday, and Friday.    Yes [provider]  cycloSPORINE modified (GENGRAF) 25 MG capsule Take 75 mg by mouth 2 (two) times daily.    Yes [provider]  EPINEPHrine (EPIPEN 2-PAK) 0.3 mg/0.3 mL IJ SOAJ injection Inject 0.3 mg into the muscle once as needed for anaphylaxis.   Yes [provider]  ferric citrate (AURYXIA) 1 GM 210 MG(Fe) tablet Take 210-630 mg by mouth 3 (three) times daily with meals. Dose is dependent on size of meal   Yes [provider]  hydroxychloroquine (PLAQUENIL) 200 MG tablet Take 200 mg by mouth daily.    Yes [provider]  magnesium oxide (MAG-OX) 400 MG tablet Take 400 mg by mouth daily as needed (constipation).    Yes [provider]  metoprolol tartrate (LOPRESSOR) 50 MG  tablet Take 25 mg by mouth See admin instructions. Take 1/2 tablet (25 mg) by mouth twice daily on Sunday, Tuesday, Thursday, Saturday (non-dialysis days) and take 1/2 tablet (25 mg) in the evening on Monday, Wednesday, Friday (dialysis days)   Yes [provider]  omeprazole (PRILOSEC) 20 MG capsule Take 20 mg by mouth daily before breakfast.    Yes [provider]  ondansetron (ZOFRAN-ODT) 8 MG disintegrating tablet Take 8 mg by mouth every 8 (eight) hours as needed for nausea or vomiting.    Yes [provider]  predniSONE (DELTASONE) 5 MG tablet Take 5 mg by mouth daily with breakfast.   Yes [provider]  ravulizumab-cwvz (ULTOMIRIS) 300 MG/30ML SOLN injection Inject 2,400 mg into the vein every 8 (eight) weeks.   Yes [provider]  rOPINIRole (REQUIP) 0.25 MG tablet Take 0.25 mg by mouth at bedtime as needed (restless legs).    Yes [provider]  sulfamethoxazole-trimethoprim (BACTRIM) 400-80 MG tablet Take 1 tablet  by mouth See admin instructions. Take one tablet by mouth on Monday, Wednesday, Friday after dialysis   Yes [provider]  valGANciclovir (VALCYTE) 450 MG tablet Take 450 mg by mouth See admin instructions. Take one tablet (450 mg) by mouth Monday, Wednesday, Friday after dialysis   Yes [provider]  multivitamin (RENA-VIT) TABS tablet Take 1 tablet by mouth at bedtime. 05/28/18   Theora Gianotti, NP   Current Facility-Administered Medications  Medication Dose Route Frequency Provider Last Rate Last Dose  . 0.9 %  sodium chloride infusion   Intravenous PRN Tally Due, MD   Stopped at 12/01/18 [phone]  . acetaminophen (TYLENOL) tablet 650 mg  650 mg Oral Q6H PRN Pahwani, Rinka R, MD       Or  . acetaminophen (TYLENOL) suppository 650 mg  650 mg Rectal Q6H PRN Pahwani, Rinka R, MD      . albuterol (PROVENTIL) (2.5 MG/3ML) 0.083% nebulizer solution 2.5 mg  2.5 mg Nebulization Q2H PRN Tally Due, MD      . aspirin chewable tablet 81 mg  81 mg Per Tube Daily Pahwani, Rinka R, MD      . chlorhexidine gluconate (MEDLINE KIT) (PERIDEX) 0.12 % solution 15 mL  15 mL Mouth Rinse BID Spero Geralds, MD   15 mL at 12/01/18 0850  . Chlorhexidine Gluconate Cloth 2 % PADS 6 each  6 each Topical Daily Pahwani, Rinka R, MD   6 each at 12/01/18 0256  . docusate sodium (COLACE) capsule 100 mg  100 mg Oral Daily PRN Spero Geralds, MD      . EPINEPHrine (ADRENALIN) 4 mg in NS 250 mL (0.016 mg/mL) premix infusion  0.5-20 mcg/min Intravenous Titrated Corey Harold, NP 15 mL/hr at 12/01/18 0800 4 mcg/min at 12/01/18 0800  . fentaNYL (SUBLIMAZE) injection 50 mcg  50 mcg Intravenous Q1H PRN Tally Due, MD      . heparin ADULT infusion 100 units/mL (25000 units/278m sodium chloride 0.45%)  600 Units/hr Intravenous Continuous MHarvel Quale RPH 6 mL/hr at 12/01/18 0600 600 Units/hr at 12/01/18 0600  . MEDLINE mouth rinse  15 mL Mouth Rinse 10 times per day DSpero Geralds MD       . pantoprazole (PROTONIX) injection 40 mg  40 mg Intravenous Daily ATally Due MD      . polyethylene glycol (MIRALAX / GLYCOLAX) packet 17 g  17 g Oral Daily PRN DSpero Geralds MD      .  sodium bicarbonate 1 mEq/mL injection            Labs: Basic Metabolic Panel: Recent Labs  Lab 12/01/18 0106  12/01/18 0144  12/01/18 0304 12/01/18 0520 12/01/18 0810  NA  --    < > 138   < > 132* 126* 134*  K  --    < > 3.7   < > 5.5* 5.6* 5.7*  CL  --   --  112*  --  90*  --  91*  CO2  --   --  <7*  --  11*  --  8*  GLUCOSE  --   --  28*  --  179*  --  117*  BUN  --   --  31*  --  42*  --  44*  CREATININE  --   --  3.04*  --  4.95*  --  5.15*  CALCIUM  --   --  5.6*  --  8.5*  --  8.8*  PHOS 8.3*  --   --   --   --   --   --    < > = values in this interval not displayed.   Liver Function Tests: Recent Labs  Lab 12/01/18 0106 12/01/18 0304 12/01/18 0810  AST 985* 1,664* PENDING  ALT 591* 962* 1,386*  ALKPHOS 90 73 83  BILITOT 1.1 1.5* 2.4*  PROT 5.2* 4.6* 4.9*  ALBUMIN 2.5* 2.4* 2.4*    Recent Labs  Lab 12/01/18 0106  AMMONIA 140*   CBC: Recent Labs  Lab 11/22/2018 0917  12/01/18 0144 12/01/18 0217 12/01/18 0245 12/01/18 0520  WBC 13.4*  --  14.5* 23.9*  --   --   NEUTROABS 10.5*  --   --   --   --   --   HGB 10.8*   < > 7.8* 10.7* 11.2* 12.2  HCT 35.8*   < > 24.9* 33.6* 33.0* 36.0  MCV 106.2*  --  103.8* 102.1*  --   --   PLT 337  --  182 272  --   --    < > = values in this interval not displayed.   CBG: Recent Labs  Lab 11/09/2018 0928 12/01/18 0242 12/01/18 0520 12/01/18 0804  GLUCAP 82 334* 105* 114*   Studies/Results: Ct Head Wo Contrast  Result Date: 12/01/2018 CLINICAL DATA:  Altered mental status, dilated pupils, new coagulopathy, possible PE a arrest secondary to hypoxia or hypoglycemia EXAM: CT HEAD WITHOUT CONTRAST TECHNIQUE: Contiguous axial images were obtained from the base of the skull through the vertex without intravenous contrast.  COMPARISON:  CT, MR 12/03/2018 FINDINGS: Brain: No evidence of acute infarction, hemorrhage, hydrocephalus, extra-axial collection or mass lesion/mass effect. Symmetric prominence of the ventricles, cisterns and sulci compatible with parenchymal volume loss. Patchy areas of white matter hypoattenuation are most compatible with chronic microvascular angiopathy. Vascular: Atherosclerotic calcification of the carotid siphons and intradural vertebral arteries. No hyperdense vessel. Skull: No calvarial fracture or suspicious osseous lesion. No scalp swelling or hematoma. Sinuses/Orbits: Paranasal sinuses and mastoid air cells are predominantly clear. Included orbital structures are unremarkable. Other: Patient intubated at this time with endotracheal and transesophageal tubes. IMPRESSION: 1. No acute intracranial abnormality. 2. Stable parenchymal atrophy and chronic microvascular angiopathy. Electronically Signed   By: Lovena Le M.D.   On: 12/01/2018 06:56   Ct Head Wo Contrast  Result Date: 11/29/2018 CLINICAL DATA:  Altered level of consciousness.  Confusion EXAM: CT HEAD WITHOUT CONTRAST TECHNIQUE: Contiguous  axial images were obtained from the base of the skull through the vertex without intravenous contrast. COMPARISON:  MRI head 09/19/2018 FINDINGS: Brain: Image quality degraded by motion. Negative for acute infarct, hemorrhage, or mass.  No midline shift. Vascular: Negative for hyperdense vessel Skull: Negative Sinuses/Orbits: Negative Other: None IMPRESSION: No acute abnormality.  Mild atrophy. Motion degraded study. Electronically Signed   By: Franchot Gallo M.D.   On: 11/10/2018 10:05   Mr Brain Wo Contrast (neuro Protocol)  Result Date: 11/15/2018 CLINICAL DATA:  Altered level of consciousness (LOC), unexplained. Additional history provided: Generalized weakness and fatigue for 1 week. Confusion. EXAM: MRI HEAD WITHOUT CONTRAST TECHNIQUE: Multiplanar, multiecho pulse sequences of the brain and  surrounding structures were obtained without intravenous contrast. COMPARISON:  Head CT 11/09/2018, brain MRI 09/19/2018 FINDINGS: Brain: There is no evidence of acute infarct. No evidence of intracranial mass. No midline shift or extra-axial fluid collection. No chronic intracranial blood products. Mild scattered T2/FLAIR hyperintensity within the cerebral white matter is nonspecific, but consistent with chronic small vessel ischemic disease. Mild generalized parenchymal atrophy. Vascular: Flow voids maintained within the proximal large arterial vessels. Skull and upper cervical spine: No focal marrow lesion. Sinuses/Orbits: Visualized orbits demonstrate no acute abnormality. No significant paranasal sinus disease or mastoid effusion. IMPRESSION: 1. No evidence of acute intracranial abnormality. 2. Mild generalized parenchymal atrophy and chronic small vessel ischemic disease. Electronically Signed   By: Kellie Simmering DO   On: 12/04/2018 18:42   Dg Chest Port 1 View  Result Date: 12/01/2018 CLINICAL DATA:  Status post intubation EXAM: PORTABLE CHEST 1 VIEW COMPARISON:  12/01/2018 FINDINGS: Endotracheal tube is noted 4.6 cm above the carina. Gastric catheter is noted coiled within the stomach. Cardiac shadow is accentuated by the portable technique. The lungs are mildly hyperinflated without focal infiltrate or effusion. No bony abnormality is noted. IMPRESSION: Endotracheal tube and nasogastric catheter in satisfactory position. No acute abnormality noted. Electronically Signed   By: Inez Catalina M.D.   On: 12/01/2018 02:43   Dg Chest Port 1 View  Result Date: 12/01/2018 CLINICAL DATA:  Respiratory distress EXAM: PORTABLE CHEST 1 VIEW COMPARISON:  11/28/2018 at 8:40 a.m. FINDINGS: Mild cardiomegaly. No focal airspace consolidation or pulmonary edema. Diffuse interstitial coarsening. No pleural effusion or pneumothorax. IMPRESSION: No active disease. Electronically Signed   By: Ulyses Jarred M.D.   On:  12/01/2018 00:24   Dg Chest Port 1 View  Result Date: 11/10/2018 CLINICAL DATA:  Altered mental status EXAM: PORTABLE CHEST 1 VIEW COMPARISON:  10/30/2018 FINDINGS: Normal mediastinum and cardiac silhouette. Normal pulmonary vasculature. Fine linear interstitial pattern noted primarily in the RIGHT upper lobe. No pneumothorax. No evidence of effusion, infiltrate, or pneumothorax. No acute bony abnormality. IMPRESSION: 1. No acute cardiopulmonary process. 2. Probable mild interstitial edema. 3. No overt pulmonary edema.  No evidence pneumonia. Electronically Signed   By: Suzy Bouchard M.D.   On: 12/04/2018 09:02    Dialysis Orders: Center: [facility] on MWF. Last HD 11/24 due to holiday schedule Time: 4 hours, 180NRe, BFR 400, DFR 800, 2K/2Ca, Profile 4, EDw 42.5kg Heparin 2000 unit bolus Mircera 225 mcg IV q 2 weeks- last 11/24 Recent labs: 11/24 Hgb 103, 11/18 Ca 10.5, phos 4.9  Assessment/Plan: 1.  S/p asystolic cardiac arrest >bradycardia: Required 11 minutes of CPR on 11/27/2018. Initially bradycardic, externally placed and started on bicarb/epi/heparin. Signficant acidosis. Discussed with Dr. Jonnie Finner, will attempt iHD today for acidosis. Will not attempt to pull fluid today due  to cardiac instability. Troponins elevated post arrest. Concern for possible PE/myocarditis per notes. On empiric antibiotics and blood cultures pending.  2. AMS:  Unclear etiology. Complaint with dialysis and BUN 32 on admission. Per primary. 3.  ESRD:  MWF, last HD 11/24 due to holiday schedule. K+ 5.6, will plan HD today as above. Use 2K bath.  4.  Hypertension/volume: BP improved on epi. CXR with some interstitial changes. Fluid status actually improved compared to recent outpatient evaluation with no edema on exam. Beta blocker on hold due to bradycardia overnight. Will keep even with HD today.  5.  Anemia: Hgb 10's to 12's. Not due for ESA. No bleeding noted.  6.  Metabolic bone disease:  Calcium 10.1 corrected. Not on VDRA. Phos 8.3. Will resume binder once patient is eating.  Anice Paganini, PA-C 12/01/2018, 9:12 AM  [facility] Pager: [phone]  Pt seen, examined and agree w A/P as above.  Kelly Splinter  MD 12/01/2018, 2:36 PM

## 2018-12-01 NOTE — Progress Notes (Signed)
Patient transported to CT and returned to 0000000 without complications. Vitals stable throughout. RT will continue to monitor.

## 2018-12-01 NOTE — Progress Notes (Signed)
This chaplain responded to Pt. Code Blue with the medical team.  The chaplain communicated with the Pt. RN-Vanessa.  The chaplain understands the RN is connecting with the Pt. family.  The chaplain offered F/U spiritual care as needed.

## 2018-12-01 NOTE — Code Documentation (Addendum)
  Patient Name: Erin Morgan   MRN: [id number]   Date of Birth/ Sex: 05/02/1955 , female      Admission Date: 11/11/2018  Attending Provider: Mckinley Jewel, MD  Primary Diagnosis: AMS (altered mental status)   Indication: Pt was in her usual state of health until this AM, when she was noted to be in asystole. Code blue was subsequently called. At the time of arrival on scene, ACLS protocol was underway.   Technical Description:  - CPR performance duration:  11 minutes  - Was defibrillation or cardioversion used? no  - Was external pacer placed? yes  - Was patient intubated pre/post CPR? yes   Medications Administered: Y = Yes; Blank = No Amiodarone    Atropine  y  Calcium    Epinephrine  y  Lidocaine    Magnesium    Norepinephrine    Phenylephrine    Sodium bicarbonate  y  Vasopressin     Post CPR evaluation:  - Final Status - Was patient successfully resuscitated ? Yes - What is current rhythm? Sinus bradycardia, with external pacing - What is current hemodynamic status? Unstable  Miscellaneous Information:  - Labs sent, including: [facility] labs  - Primary team notified?  yes  - Family Notified? no  - Additional notes/ transfer status:  Patient was transferred from 5W31 to 23M05. PCCM is currently at the bedside.      Earlene Plater, MD Internal Medicine, PGY1 Pager: [phone]  12/01/2018,1:48 AM

## 2018-12-02 DIAGNOSIS — R0603 Acute respiratory distress: Secondary | ICD-10-CM | POA: Diagnosis not present

## 2018-12-02 DIAGNOSIS — I420 Dilated cardiomyopathy: Secondary | ICD-10-CM | POA: Diagnosis not present

## 2018-12-02 DIAGNOSIS — R778 Other specified abnormalities of plasma proteins: Secondary | ICD-10-CM | POA: Diagnosis not present

## 2018-12-02 DIAGNOSIS — I469 Cardiac arrest, cause unspecified: Secondary | ICD-10-CM | POA: Diagnosis not present

## 2018-12-02 LAB — PHOSPHORUS
Phosphorus: 3.9 mg/dL (ref 2.5–4.6)
Phosphorus: 4.2 mg/dL (ref 2.5–4.6)

## 2018-12-02 LAB — HEPARIN LEVEL (UNFRACTIONATED)
Heparin Unfractionated: <0.1 IU/mL — ABNORMAL LOW (ref 0.30–0.70)
Heparin Unfractionated: <0.1 IU/mL — ABNORMAL LOW (ref 0.30–0.70)
Heparin Unfractionated: 0.18 IU/mL — ABNORMAL LOW (ref 0.30–0.70)

## 2018-12-02 LAB — CBC
HCT: 27.9 % — ABNORMAL LOW (ref 36.0–46.0)
Hemoglobin: 8.9 g/dL — ABNORMAL LOW (ref 12.0–15.0)
MCH: 31.4 pg (ref 26.0–34.0)
MCHC: 31.9 g/dL (ref 30.0–36.0)
MCV: 98.6 fL (ref 80.0–100.0)
Platelets: 145 10*3/uL — ABNORMAL LOW (ref 150–400)
RBC: 2.83 MIL/uL — ABNORMAL LOW (ref 3.87–5.11)
RDW: 19.2 % — ABNORMAL HIGH (ref 11.5–15.5)
WBC: 12.4 10*3/uL — ABNORMAL HIGH (ref 4.0–10.5)
nRBC: 5.5 % — ABNORMAL HIGH (ref 0.0–0.2)

## 2018-12-02 LAB — COMPREHENSIVE METABOLIC PANEL
ALT: 962 U/L — ABNORMAL HIGH (ref 0–44)
AST: 1664 U/L — ABNORMAL HIGH (ref 15–41)
Albumin: 2.4 g/dL — ABNORMAL LOW (ref 3.5–5.0)
Alkaline Phosphatase: 73 U/L (ref 38–126)
Anion gap: 31 — ABNORMAL HIGH (ref 5–15)
BUN: 42 mg/dL — ABNORMAL HIGH (ref 8–23)
CO2: 11 mmol/L — ABNORMAL LOW (ref 22–32)
Calcium: 8.5 mg/dL — ABNORMAL LOW (ref 8.9–10.3)
Chloride: 90 mmol/L — ABNORMAL LOW (ref 98–111)
Creatinine, Ser: 4.95 mg/dL — ABNORMAL HIGH (ref 0.44–1.00)
GFR calc Af Amer: 10 mL/min — ABNORMAL LOW (ref >60)
GFR calc non Af Amer: 9 mL/min — ABNORMAL LOW (ref >60)
Glucose, Bld: 179 mg/dL — ABNORMAL HIGH (ref 70–99)
Potassium: 5.5 mmol/L — ABNORMAL HIGH (ref 3.5–5.1)
Sodium: 132 mmol/L — ABNORMAL LOW (ref 135–145)
Total Bilirubin: 1.5 mg/dL — ABNORMAL HIGH (ref 0.3–1.2)
Total Protein: 4.6 g/dL — ABNORMAL LOW (ref 6.5–8.1)

## 2018-12-02 LAB — GLUCOSE, CAPILLARY
Glucose-Capillary: 122 mg/dL — ABNORMAL HIGH (ref 70–99)
Glucose-Capillary: 124 mg/dL — ABNORMAL HIGH (ref 70–99)
Glucose-Capillary: 131 mg/dL — ABNORMAL HIGH (ref 70–99)
Glucose-Capillary: 143 mg/dL — ABNORMAL HIGH (ref 70–99)
Glucose-Capillary: 143 mg/dL — ABNORMAL HIGH (ref 70–99)
Glucose-Capillary: 148 mg/dL — ABNORMAL HIGH (ref 70–99)

## 2018-12-02 LAB — MAGNESIUM
Magnesium: 3.1 mg/dL — ABNORMAL HIGH (ref 1.7–2.4)
Magnesium: 3.1 mg/dL — ABNORMAL HIGH (ref 1.7–2.4)

## 2018-12-02 MED ORDER — HEPARIN BOLUS VIA INFUSION
1000.0000 [IU] | Freq: Once | INTRAVENOUS | Status: AC
  Administered 2018-12-02: 1000 [IU] via INTRAVENOUS
  Filled 2018-12-02: qty 1000

## 2018-12-02 MED ORDER — PREDNISONE 5 MG/5ML PO SOLN
5.0000 mg | Freq: Every day | ORAL | Status: DC
  Administered 2018-12-02 – 2018-12-04 (×3): 5 mg
  Filled 2018-12-02 (×3): qty 5

## 2018-12-02 NOTE — Progress Notes (Signed)
PT Cancellation Note  Patient Details Name: Erin Morgan MRN: [id number] DOB: 08/17/1955   Cancelled Treatment:    Reason Eval/Treat Not Completed: Patient not medically ready.    Shary Decamp [facility] 12/02/2018, 8:42 AM [place] Pager [phone] Office [phone]

## 2018-12-02 NOTE — Progress Notes (Signed)
ANTICOAGULATION CONSULT NOTE  Pharmacy Consult:  Heparin Indication: ACS   Patient Measurements: Height: 5\' 4"  (162.6 cm) Weight: 109 lb 9.1 oz (49.7 kg) IBW/kg (Calculated) : 54.7 Heparin Dosing Weight: 49.7 kg  Vital Signs: Temp: 98.7 F (37.1 C) (11/28 2000) Temp Source: Oral (11/28 2000) BP: 138/65 (11/28 2056) Pulse Rate: 98 (11/28 2200)  Labs: Recent Labs    11/10/2018 0917  12/01/18 0106  12/01/18 0144 12/01/18 0217 12/01/18 0245 12/01/18 0304 12/01/18 0520 12/01/18 0810 12/01/18 0945  12/01/18 1615 12/01/18 2314 12/02/18 0339 12/02/18 0829 12/02/18 2130  HGB 10.8*  --   --    < > 7.8* 10.7* 11.2*  --  12.2  --   --   --   --   --  8.9*  --   --   HCT 35.8*   < >  --    < > 24.9* 33.6* 33.0*  --  36.0  --   --   --   --   --  27.9*  --   --   PLT 337  --   --   --  182 272  --   --   --   --   --   --   --   --  145*  --   --   LABPROT 13.5  --   --   --   --  20.6*  --   --   --   --   --   --   --   --   --   --   --   INR 1.0  --   --   --   --  1.8*  --   --   --   --   --   --   --   --   --   --   --   HEPARINUNFRC  --   --  <0.10*  --   --   --   --   --   --   --   --    < >  --  <0.10*  --  <0.10* 0.18*  CREATININE 3.94*  --   --   --  3.04*  --   --  4.95*  --  5.15*  --   --  1.90*  --   --   --   --   TROPONINIHS 942*   < > 1,936*  --   --   --   --   --   --   --  4,355*  --  7,503*  --   --   --   --    < > = values in this interval not displayed.    Estimated Creatinine Clearance: 23.5 mL/min (A) (by C-G formula based on SCr of 1.9 mg/dL (H)).   Assessment: 63 yo female with ESRD on HD - due to failed kidney transplant. Pt is admitted due to altered mental status. Heparin infusion has been ordered due to elevated troponin, which is likely due to demand ischemia from cardiac arrest per Cards.  Heparin level came back subtherapeutic at 0.18, on 950 units/hr. Hgb 8.9, plt 145. No infusion issues or s/sx of bleeding. Heparin was moved to IV site on  hand - heparin level drawn from A-line.   Goal of Therapy:  Heparin level 0.3-0.7 units/ml Monitor platelets by anticoagulation protocol: Yes   Plan:  -Increase heparin infusion to 1100 units/hr - Will  continue to monitor for any signs/symptoms of bleeding and will follow up with heparin level in 8 hours   Thank you for allowing pharmacy to be a part of this patient's care.  Antonietta Jewel, PharmD, BCCCP Clinical Pharmacist  Phone: [phone]  Please check AMION for all [place] phone numbers After 10:00 PM, call [place] [phone] 12/02/2018 10:25 PM   **Pharmacist phone directory can now be found on [email or web address] ([email or web address]).  Listed under [place].

## 2018-12-02 NOTE — Progress Notes (Signed)
ANTICOAGULATION CONSULT NOTE  Pharmacy Consult:  Heparin Indication: ACS   Patient Measurements: Height: 5\' 4"  (162.6 cm) Weight: 109 lb 9.1 oz (49.7 kg) IBW/kg (Calculated) : 54.7 Heparin Dosing Weight: 49.7 kg  Vital Signs: Temp: 98.3 F (36.8 C) (11/28 0807) Temp Source: Oral (11/28 0807) BP: 104/54 (11/28 0806) Pulse Rate: 85 (11/28 0806)  Labs: Recent Labs    11/09/2018 0917  12/01/18 0106  12/01/18 0144 12/01/18 0217 12/01/18 0245 12/01/18 0304 12/01/18 0520 12/01/18 0810 12/01/18 0945 12/01/18 1230 12/01/18 1615 12/01/18 2314 12/02/18 0339 12/02/18 0829  HGB 10.8*  --   --    < > 7.8* 10.7* 11.2*  --  12.2  --   --   --   --   --  8.9*  --   HCT 35.8*   < >  --    < > 24.9* 33.6* 33.0*  --  36.0  --   --   --   --   --  27.9*  --   PLT 337  --   --   --  182 272  --   --   --   --   --   --   --   --  145*  --   LABPROT 13.5  --   --   --   --  20.6*  --   --   --   --   --   --   --   --   --   --   INR 1.0  --   --   --   --  1.8*  --   --   --   --   --   --   --   --   --   --   HEPARINUNFRC  --    < > <0.10*  --   --   --   --   --   --   --   --  <0.10*  --  <0.10*  --  <0.10*  CREATININE 3.94*  --   --   --  3.04*  --   --  4.95*  --  5.15*  --   --  1.90*  --   --   --   TROPONINIHS 942*   < > 1,936*  --   --   --   --   --   --   --  4,355*  --  7,503*  --   --   --    < > = values in this interval not displayed.    Estimated Creatinine Clearance: 23.5 mL/min (A) (by C-G formula based on SCr of 1.9 mg/dL (H)).   Assessment: 63 yo female with ESRD on HD - due to failed kidney transplant. Pt is admitted due to altered mental status. Heparin infusion has been ordered due to elevated troponin, which is likely due to demand ischemia from cardiac arrest per Cards.  Heparin level this morning remains undetectable despite multiple rate increases (HL< 0.1). Drip infusing in wrist and RN unsure if adequately infusing there - switched to [facility] site but leaking. RN  placed new IV - will not increase the rate and recheck a level 8 hours after re-start. Trend down in Hgb noted - no bleeding at this time, will monitor.   Goal of Therapy:  Heparin level 0.3-0.7 units/ml Monitor platelets by anticoagulation protocol: Yes   Plan:  - Continue Heparin at 950 units/hr (9.5  ml/h) - Will continue to monitor for any signs/symptoms of bleeding and will follow up with heparin level in 8 hours   Thank you for allowing pharmacy to be a part of this patient's care.  Alycia Rossetti, PharmD, BCPS Clinical Pharmacist Clinical phone for 12/02/2018: [phone] 12/02/2018 11:52 AM   **Pharmacist phone directory can now be found on [email or web address] ([email or web address]).  Listed under [place].

## 2018-12-02 NOTE — Progress Notes (Signed)
ANTICOAGULATION CONSULT NOTE - Follow Up Consult  Pharmacy Consult for heparin Indication: ACS  Labs: Recent Labs    11/26/2018 0917  12/01/18 0106  12/01/18 0144 12/01/18 0217 12/01/18 0245 12/01/18 0304 12/01/18 0520 12/01/18 0810 12/01/18 0945 12/01/18 1230 12/01/18 1615 12/01/18 2314  HGB 10.8*  --   --    < > 7.8* 10.7* 11.2*  --  12.2  --   --   --   --   --   HCT 35.8*   < >  --    < > 24.9* 33.6* 33.0*  --  36.0  --   --   --   --   --   PLT 337  --   --   --  182 272  --   --   --   --   --   --   --   --   LABPROT 13.5  --   --   --   --  20.6*  --   --   --   --   --   --   --   --   INR 1.0  --   --   --   --  1.8*  --   --   --   --   --   --   --   --   HEPARINUNFRC  --   --  <0.10*  --   --   --   --   --   --   --   --  <0.10*  --  <0.10*  CREATININE 3.94*  --   --   --  3.04*  --   --  4.95*  --  5.15*  --   --  1.90*  --   TROPONINIHS 942*   < > 1,936*  --   --   --   --   --   --   --  4,355*  --  7,503*  --    < > = values in this interval not displayed.    Assessment: 63yo female remains subtherapeutic on heparin after rate change; no gtt issues or signs of bleeding per RN.  Goal of Therapy:  Heparin level 0.3-0.7 units/ml   Plan:  Will rebolus with heparin 1000 units and increase heparin gtt by 4 units/kg/hr to 950 units/hr and check level in 8 hours.    Wynona Neat, PharmD, BCPS  12/02/2018,12:20 AM

## 2018-12-02 NOTE — Progress Notes (Signed)
Progress Note  Patient Name: Erin Morgan Date of Encounter: 12/02/2018  Primary Cardiologist: Mertie Moores, MD   Subjective   Remains intubated but awake.  Chest CTA neg for PE. Marland KitchenDenies any chest pain  Inpatient Medications    Scheduled Meds:  aspirin  81 mg Per Tube Daily   B-complex with vitamin C  1 tablet Per Tube Daily   chlorhexidine gluconate (MEDLINE KIT)  15 mL Mouth Rinse BID   Chlorhexidine Gluconate Cloth  6 each Topical Daily   cycloSPORINE  75 mg Per Tube BID   feeding supplement (PRO-STAT SUGAR FREE 64)  30 mL Per Tube Daily   mouth rinse  15 mL Mouth Rinse 10 times per day   pantoprazole (PROTONIX) IV  40 mg Intravenous Daily   predniSONE  5 mg Per Tube Q breakfast   sulfamethoxazole-trimethoprim  10 mL Per Tube Q M,W,F-2000   valganciclovir  450 mg Oral Q M,W,F-2000   vancomycin  500 mg Intravenous Q M,W,F-HD   Continuous Infusions:  sodium chloride Stopped (12/01/18 1744)   ceFEPime (MAXIPIME) IV Stopped (12/01/18 2051)   feeding supplement (VITAL 1.5 CAL) 1,000 mL (12/01/18 1745)   fentaNYL infusion INTRAVENOUS 200 mcg/hr (12/02/18 0700)   heparin 950 Units/hr (12/02/18 0700)   norepinephrine (LEVOPHED) Adult infusion Stopped (12/02/18 0131)   PRN Meds: sodium chloride, acetaminophen **OR** acetaminophen, albuterol, docusate, fentaNYL (SUBLIMAZE) injection, polyethylene glycol   Vital Signs    Vitals:   12/02/18 0400 12/02/18 0500 12/02/18 0600 12/02/18 0700  BP:      Pulse: 91 88 88 91  Resp: _0 (!) 22  Temp: 98.6 F (37 C)     TempSrc: Oral     SpO2: 100% 100% 100% 100%  Weight:      Height:        Intake/Output Summary (Last 24 hours) at 12/02/2018 0752 Last data filed at 12/02/2018 0700 Gross per 24 hour  Intake 1866.2 ml  Output 0 ml  Net 1866.2 ml   Filed Weights   12/01/18 0500 12/01/18 1125 12/02/18 0343  Weight: 47.3 kg 48.1 kg 49.7 kg    Telemetry    NSR - Personally Reviewed  ECG      No new EKG to review - Personally Reviewed  Physical Exam   GEN: intubated and sedated Neck: No JVD Cardiac: RRR, no murmurs, rubs, or gallops.  Respiratory: Clear to auscultation bilaterally anteriorly GI: Soft, nontender, non-distended  MS: No edema; No deformity. Neuro:  cannot assess Psych:cannot assess  Labs    Chemistry Recent Labs  Lab 12/01/18 0106  12/01/18 0304 12/01/18 0520 12/01/18 0810 12/01/18 1615  NA  --    < > 132* 126* 134* 136  K  --    < > 5.5* 5.6* 5.7* 3.7  CL  --    < > 90*  --  91* 96*  CO2  --    < > 11*  --  8* 23  GLUCOSE  --    < > 179*  --  117* 71  BUN  --    < > 42*  --  44* 9  CREATININE  --    < > 4.95*  --  5.15* 1.90*  CALCIUM  --    < > 8.5*  --  8.8* 8.0*  PROT 5.2*  --  4.6*  --  4.9*  --   ALBUMIN 2.5*  --  2.4*  --  2.4*  --  AST 985*  --  1,664*  --  2,761*  --   ALT 591*  --  962*  --  1,386*  --   ALKPHOS 90  --  73  --  83  --   BILITOT 1.1  --  1.5*  --  2.4*  --   GFRNONAA  --    < > 9*  --  8* 27*  GFRAA  --    < > 10*  --  10* 32*  ANIONGAP  --    < > 31*  --  35* 17*   < > = values in this interval not displayed.     Hematology Recent Labs  Lab 12/01/18 0144 12/01/18 0217 12/01/18 0245 12/01/18 0520 12/02/18 0339  WBC 14.5* 23.9*  --   --  12.4*  RBC 2.40* 3.29*  --   --  2.83*  HGB 7.8* 10.7* 11.2* 12.2 8.9*  HCT 24.9* 33.6* 33.0* 36.0 27.9*  MCV 103.8* 102.1*  --   --  98.6  MCH 32.5 32.5  --   --  31.4  MCHC 31.3 31.8  --   --  31.9  RDW 19.0* 19.0*  --   --  19.2*  PLT 182 272  --   --  145*    Cardiac EnzymesNo results for input(s): TROPONINI in the last 168 hours. No results for input(s): TROPIPOC in the last 168 hours.   BNPNo results for input(s): BNP, PROBNP in the last 168 hours.   DDimer  Recent Labs  Lab 12/01/18 1230  DDIMER >20.00*     Radiology    Ct Head Wo Contrast  Result Date: 12/01/2018 CLINICAL DATA:  Altered mental status, dilated pupils, new coagulopathy, possible  PE a arrest secondary to hypoxia or hypoglycemia EXAM: CT HEAD WITHOUT CONTRAST TECHNIQUE: Contiguous axial images were obtained from the base of the skull through the vertex without intravenous contrast. COMPARISON:  CT, MR 11/27/2018 FINDINGS: Brain: No evidence of acute infarction, hemorrhage, hydrocephalus, extra-axial collection or mass lesion/mass effect. Symmetric prominence of the ventricles, cisterns and sulci compatible with parenchymal volume loss. Patchy areas of white matter hypoattenuation are most compatible with chronic microvascular angiopathy. Vascular: Atherosclerotic calcification of the carotid siphons and intradural vertebral arteries. No hyperdense vessel. Skull: No calvarial fracture or suspicious osseous lesion. No scalp swelling or hematoma. Sinuses/Orbits: Paranasal sinuses and mastoid air cells are predominantly clear. Included orbital structures are unremarkable. Other: Patient intubated at this time with endotracheal and transesophageal tubes. IMPRESSION: 1. No acute intracranial abnormality. 2. Stable parenchymal atrophy and chronic microvascular angiopathy. Electronically Signed   By: Lovena Le M.D.   On: 12/01/2018 06:56   Ct Head Wo Contrast  Result Date: 11/26/2018 CLINICAL DATA:  Altered level of consciousness.  Confusion EXAM: CT HEAD WITHOUT CONTRAST TECHNIQUE: Contiguous axial images were obtained from the base of the skull through the vertex without intravenous contrast. COMPARISON:  MRI head 09/19/2018 FINDINGS: Brain: Image quality degraded by motion. Negative for acute infarct, hemorrhage, or mass.  No midline shift. Vascular: Negative for hyperdense vessel Skull: Negative Sinuses/Orbits: Negative Other: None IMPRESSION: No acute abnormality.  Mild atrophy. Motion degraded study. Electronically Signed   By: Franchot Gallo M.D.   On: 11/07/2018 10:05   Ct Angio Chest Pe W Or Wo Contrast  Result Date: 12/01/2018 CLINICAL DATA:  Cardiac arrest with concern for  pulmonary embolism. EXAM: CT ANGIOGRAPHY CHEST WITH CONTRAST TECHNIQUE: Multidetector CT imaging of the chest was performed using the standard  protocol during bolus administration of intravenous contrast. Multiplanar CT image reconstructions and MIPs were obtained to evaluate the vascular anatomy. CONTRAST:  51m OMNIPAQUE IOHEXOL 350 MG/ML SOLN COMPARISON:  CT chest dated July 18, 2017. FINDINGS: Cardiovascular: Contrast injection is sufficient to demonstrate satisfactory opacification of the pulmonary arteries to the segmental level. There is no pulmonary embolus. The main pulmonary artery is normal. There is no CT evidence of acute right heart strain. There are atherosclerotic changes of the visualized thoracic aorta without evidence for an aneurysm. Heart size is enlarged. There is no pericardial effusion. Atherosclerotic changes are noted of the coronary arteries. There is some mild reflux into the IVC. Mediastinum/Nodes: --No mediastinal or hilar lymphadenopathy. --No axillary lymphadenopathy. --No supraclavicular lymphadenopathy. --there is a multinodular goiter. There is a dominant left-sided thyroid nodule measuring approximately 1.5 cm. --the enteric tube courses through the esophagus and terminates below the left hemidiaphragm. The tip is not visualized on this exam. Lungs/Pleura: There is bibasilar airspace disease favored to represent a combination of atelectasis and consolidation, especially on the left. The endotracheal tube terminates above the carina. There is no pneumothorax. There is a moderate right and small left pleural effusion. Upper Abdomen: There is a small amount of free fluid in the upper abdomen. There are innumerable calcifications in the spleen and liver likely related to a prior granulomatous infection. Musculoskeletal: There are bilateral nondisplaced rib fractures likely related to cardiopulmonary resuscitation. There appears to be a fracture involving the inferior sternum that is  poorly evaluated secondary to motion artifact at this level. Review of the MIP images confirms the above findings. IMPRESSION: 1. There is no evidence for acute pulmonary embolus. 2. There is bibasilar airspace disease favored to represent a combination of atelectasis and consolidation. 3. Bilateral nondisplaced rib fractures likely related to cardiopulmonary resuscitation. There appears to be a fracture involving the inferior sternum that is poorly evaluated secondary to motion artifact at this level. No pneumothorax identified on this exam. 4. Bilateral pleural effusions, right greater than left. 5. Cardiomegaly. 6. Multinodular goiter with a dominant left-sided thyroid nodule measuring approximately 1.5 cm. Further evaluation with nonemergent outpatient thyroid ultrasound is recommended if not already performed. 7. Small amount of free fluid in the upper abdomen. Aortic Atherosclerosis (ICD10-I70.0). Electronically Signed   By: CConstance HolsterM.D.   On: 12/01/2018 23:08   Mr Brain Wo Contrast (neuro Protocol)  Result Date: 11/05/2018 CLINICAL DATA:  Altered level of consciousness (LOC), unexplained. Additional history provided: Generalized weakness and fatigue for 1 week. Confusion. EXAM: MRI HEAD WITHOUT CONTRAST TECHNIQUE: Multiplanar, multiecho pulse sequences of the brain and surrounding structures were obtained without intravenous contrast. COMPARISON:  Head CT 11/26/2018, brain MRI 09/19/2018 FINDINGS: Brain: There is no evidence of acute infarct. No evidence of intracranial mass. No midline shift or extra-axial fluid collection. No chronic intracranial blood products. Mild scattered T2/FLAIR hyperintensity within the cerebral white matter is nonspecific, but consistent with chronic small vessel ischemic disease. Mild generalized parenchymal atrophy. Vascular: Flow voids maintained within the proximal large arterial vessels. Skull and upper cervical spine: No focal marrow lesion. Sinuses/Orbits:  Visualized orbits demonstrate no acute abnormality. No significant paranasal sinus disease or mastoid effusion. IMPRESSION: 1. No evidence of acute intracranial abnormality. 2. Mild generalized parenchymal atrophy and chronic small vessel ischemic disease. Electronically Signed   By: KKellie SimmeringDO   On: 12/04/2018 18:42   Dg Chest Port 1 View  Result Date: 12/01/2018 CLINICAL DATA:  Status post intubation EXAM: PORTABLE CHEST 1  VIEW COMPARISON:  12/01/2018 FINDINGS: Endotracheal tube is noted 4.6 cm above the carina. Gastric catheter is noted coiled within the stomach. Cardiac shadow is accentuated by the portable technique. The lungs are mildly hyperinflated without focal infiltrate or effusion. No bony abnormality is noted. IMPRESSION: Endotracheal tube and nasogastric catheter in satisfactory position. No acute abnormality noted. Electronically Signed   By: Inez Catalina M.D.   On: 12/01/2018 02:43   Dg Chest Port 1 View  Result Date: 12/01/2018 CLINICAL DATA:  Respiratory distress EXAM: PORTABLE CHEST 1 VIEW COMPARISON:  12/04/2018 at 8:40 a.m. FINDINGS: Mild cardiomegaly. No focal airspace consolidation or pulmonary edema. Diffuse interstitial coarsening. No pleural effusion or pneumothorax. IMPRESSION: No active disease. Electronically Signed   By: Ulyses Jarred M.D.   On: 12/01/2018 00:24   Dg Chest Port 1 View  Result Date: 12/01/2018 CLINICAL DATA:  Altered mental status EXAM: PORTABLE CHEST 1 VIEW COMPARISON:  10/30/2018 FINDINGS: Normal mediastinum and cardiac silhouette. Normal pulmonary vasculature. Fine linear interstitial pattern noted primarily in the RIGHT upper lobe. No pneumothorax. No evidence of effusion, infiltrate, or pneumothorax. No acute bony abnormality. IMPRESSION: 1. No acute cardiopulmonary process. 2. Probable mild interstitial edema. 3. No overt pulmonary edema.  No evidence pneumonia. Electronically Signed   By: Suzy Bouchard M.D.   On: 11/15/2018 09:02     Cardiac Studies   2D echo 12/01/2018 IMPRESSIONS    1. Left ventricular ejection fraction, by visual estimation, is 45 to 50%. The left ventricle has mildly decreased function. There is moderately increased left ventricular hypertrophy.  2. Left ventricular diastolic parameters are consistent with Grade III diastolic dysfunction (restrictive).  3. Global right ventricle has normal systolic function.The right ventricular size is normal.  4. Left atrial size was moderately dilated.  5. Right atrial size was normal.  6. Moderate mitral annular calcification.  7. The mitral valve is normal in structure. Trace mitral valve regurgitation. No evidence of mitral stenosis.  8. The tricuspid valve is normal in structure. Tricuspid valve regurgitation is mild.  9. The aortic valve is tricuspid. Aortic valve regurgitation is not visualized. Mild aortic valve stenosis. 10. The pulmonic valve was normal in structure. Pulmonic valve regurgitation is trivial. 11. Mildly elevated pulmonary artery systolic pressure. 12. The inferior vena cava is dilated in size with >50% respiratory variability, suggesting right atrial pressure of 8 mmHg. 13. Mild global reduction in LV systolic function; restrictive filling; moderate LVH; myocardium with speckled appearance (consider amyloid); mild AS (mean gradient 14 mmHg); moderate LAE; mild TR.  Patient Profile     63 y.o. female with a history of normal coronaries on cardiac catheterization in 09/3265, grade 2 diastolic dysfunction, ectopic atrial tachycardia followed by EP, hypertension, ESRD on hemodialysis, rheumatoid arthritis who presented with altered mental status felt to likely by secondary to acute metabolic encephalopathy. Cardiology consulted for elevated troponin.  Assessment & Plan    Elevated Troponin - High-sensitivity elevated at 942 >> 990 and then jumped to 1,936 after hypoxic/bradycardic arrest and continues to increase to 7503. - Normal  coronaries on cath in 05/2018. - Echo shows LVEF of 45-50% with grade 3 diastolic dysfunction.  RV size and systolic function normal. Myocardium with speckled appearance ? Amyloid. - Chest CTA neg for acute PE - Currently on Heparin drip.  - Continue Aspirin. - Etiology of troponin elevation unclear at this time. Diff dx includes demand ischemia in the setting of acute hypoxia and bradycardic arrest vs Myocarditis. - CRP elevated at 8.5 and  sed rate only 13 which I would expect to be higher in setting of acute myocarditis.  Bradycardic/hypoxic cardiac Arrest and CHB - Patient had acute hypoxia leading to bradycardia/CHB and cardiac arrest. Patient was intubated and required 11 minutes of CPR before ROSC was obtained. She was given Atropine and Dopmaine with little improvement and briefly required external pacing while Epinephrine infusion was started. - Telemetry reviewed.. Difficult to assess underlying rhythm and difficult to appreciate P waves a times prior to the arrest. However, she did have an episode of complete heart block with long pauses of no ventricular escape leading up to the arrest.  - Appreciate EP recs - no indication for pacing at this time and arrhythmias likely related to acute hypoxia. - Continue to monitor on telemetry.  History of Atrial Tachycardia - Previously on Diltiazem but this was held on recent admission due to soft BP and was started on Lopressor 64m twice daily instead and now on hold due to transient complete heart block.  Altered mental Status - Head CT benign. - WBC up to 23.9 yesterday but now down to 12.4 - Blood cultures pending. - Management per primary team.  Leukocytosis - WBC elevated at 13.4 >> 14.5 >> 23.9 >> 12.4. - Lactic acid > 11.0 - Blood cultures pending.  - COVID negative. - Management per primary team.  Elevated Transaminases - Hepatitis panel unremarkable. - Likely shock liver. - Management per primary team.  ESRD  -  Management per Nephrology.      For questions or updates, please contact [place] consult [email or web address] for contact info under Cardiology/STEMI.      Signed, TFransico Him MD  12/02/2018, 7:52 AM

## 2018-12-02 NOTE — Progress Notes (Signed)
NAME:  Erin Morgan, MRN:  [id number], DOB:  04-20-1955, LOS: 2 ADMISSION DATE:  11/20/2018, CONSULTATION DATE:  12/01/2018 REFERRING MD:  Dr. Doristine Bosworth CHIEF COMPLAINT:  Cardiac arrest  Brief History   This is a 63 year old woman with history of end-stage renal disease, with a recent admission for symptomatic anemia requiring transfusion.  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  She subsequently developed graft failure and is now back on dialysis.  She was having weakness and lethargy at home, and did complete her full run of dialysis on Tuesday.  She was admitted on 11/26 for weakness and lethargy.  Shortly after admission she developed cardiac arrest with asystole and received ACLS protocol for 11 minutes.  She was intubated and was subsequently bradycardic, for which she was placed on a dopamine infusion with minimal benefit.  She was then transcutaneously paced.  When neither of these work she was transitioned to an epinephrine drip.  She was admitted to the 31M ICU and PCCM was consulted for further management.  Consults:  Nephrology  Procedures:  11/27 Intubation  Micro Data:  Blood cultures 11/26 drawn and pending  Antimicrobials:  11/27 cefepime>> 11/27 vancomycin>>  Interim history/subjective:  Overnight no acute issues.  This morning was sedated on fentanyl.  After holding fentanyl and coordinating for an SBT, she failed her SBT for apnea.  Objective   Blood pressure (!) 104/54, pulse 85, temperature 98.3 F (36.8 C), temperature source Oral, resp. rate 18, height 5\' 4"  (1.626 m), weight 49.7 kg, SpO2 100 %.    Vent Mode: PRVC FiO2 (%):  [40 %] 40 % Set Rate:  [18 bmp] 18 bmp Vt Set:  [430 mL] 430 mL PEEP:  [5 cmH20] 5 cmH20 Pressure Support:  [10 cmH20] 10 cmH20 Plateau Pressure:  [13 cmH20-20 cmH20] 17 cmH20   Intake/Output Summary (Last 24 hours) at 12/02/2018 0906 Last data  filed at 12/02/2018 0800 Gross per 24 hour  Intake 1614.16 ml  Output 0 ml  Net 1614.16 ml   Filed Weights   12/01/18 0500 12/01/18 1125 12/02/18 0343  Weight: 47.3 kg 48.1 kg 49.7 kg    Examination: General: Intubated, resting comfortably HENT: at/, mmm, ett in place Lungs: No increased wob, lungs are clear, no wheezes or crackles Cardiovascular: tachycardic, regular, no mrg Abdomen: scaphoid, soft Extremities: LUE AVF with palpable thrill and bruit.  Neuro: Follows commands, moves all 4 extremities, denies pain.  MSK: no rashes or acute synovitis Lines: OG, left femoral A-line, right femoral CVC   Assessment & Plan:  Erin Morgan is a 63 year old with a history of end-stage renal disease status post recent failed kidney transplant at [facility] in September 2020, who presents with:  Cardiac arrest Bradycardia, Asystole.  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  She is also still taking cyclosporine, and Valcyte. Heparin gtt -we will continue for now pending cardiology recommendations.  She has been unable to get a cardiac MRI to evaluate for infiltrative heart disease or myocarditis due to her end-stage renal disease.  Her CT PE study was negative.  Currently unclear what the etiology of her cardiac arrest was.  EP has also been consulted,  the transient heart block was likely either metabolic or hypoxemic in nature, no need for any permanent pacemaker at this time. Continue tube feeds We will discontinue empiric antibiotics, after 48 hours she has had no positive blood cultures to date. Discussed with nephrology team, plan to continue  HD Monday Wednesday Friday. Her mental status today prohibited extubation.  Will try again tomorrow  Best practice:  Diet: Continue tube feeds Pain/Anxiety/Delirium protocol (if indicated): fentanyl infusion and prn VAP protocol (if indicated): ordered. DVT prophylaxis: heparin gtt GI prophylaxis: PPI Glucose control: Controlled, under 180 Foley anuric Mobility: bed rest Code Status: Full Code Family Communication: Mother updated at bedside. Disposition: needs ICU  Labs   I have personally reviewed her CT PE study from 11/27.  There is no evidence of acute pulmonary embolism, or right heart strain.  CBC: Recent Labs  Lab 11/12/2018 0917  12/01/18 0144 12/01/18 0217 12/01/18 0245 12/01/18 0520 12/02/18 0339  WBC 13.4*  --  14.5* 23.9*  --   --  12.4*  NEUTROABS 10.5*  --   --   --   --   --   --   HGB 10.8*   < > 7.8* 10.7* 11.2* 12.2 8.9*  HCT 35.8*   < > 24.9* 33.6* 33.0* 36.0 27.9*  MCV 106.2*  --  103.8* 102.1*  --   --  98.6  PLT 337  --  182 272  --   --  145*   < > = values in this interval not displayed.    Basic Metabolic Panel: Recent Labs  Lab 11/06/2018 0917 12/01/18 0106  12/01/18 0144 12/01/18 0245 12/01/18 0304 12/01/18 0520 12/01/18 0810 12/01/18 1230 12/01/18 1615 12/02/18 0339  NA 128*  --    < > 138 124* 132* 126* 134*  --  136  --   K 4.9  --    < > 3.7 5.5* 5.5* 5.6* 5.7*  --  3.7  --   CL 90*  --   --  112*  --  90*  --  91*  --  96*  --   CO2 22  --   --  <7*  --  11*  --  8*  --  23  --   GLUCOSE 99  --   --  28*  --  179*  --  117*  --  71  --   BUN 32*  --   --  31*  --  42*  --  44*  --  9  --   CREATININE 3.94*  --   --  3.04*  --  4.95*  --  5.15*  --  1.90*  --   CALCIUM 9.4  --   --  5.6*  --  8.5*  --  8.8*  --  8.0*  --   MG  --  2.1  --   --   --   --   --   --  1.8 1.7 3.1*  PHOS  --  8.3*  --   --   --   --   --   --  4.7* 3.7 3.9   < > = values in this interval not displayed.   GFR: Estimated Creatinine Clearance: 23.5  mL/min (A) (by C-G formula based on SCr of 1.9 mg/dL (H)). Recent Labs  Lab 11/12/2018 0917 12/01/18 0144 12/01/18 0217 12/01/18 0800 12/01/18 1615 12/02/18 0339  WBC 13.4* 14.5* 23.9*  --   --  12.4*  LATICACIDVEN  --   --   --  >11.0* 4.0*  --     Liver Function Tests: Recent Labs  Lab 12/02/2018 0917 12/01/18 0106 12/01/18 0304 12/01/18 0810  AST 37 985* 1,664* 2,761*  ALT 44 591* 962* 1,386*  ALKPHOS 62 90 73 83  BILITOT 1.1 1.1 1.5* 2.4*  PROT 5.3* 5.2* 4.6* 4.9*  ALBUMIN 2.7* 2.5* 2.4* 2.4*   No results for input(s): LIPASE, AMYLASE in the last 168 hours. Recent Labs  Lab 12/01/18 0106  AMMONIA 140*    ABG    Component Value Date/Time   PHART 7.155 (LL) 12/01/2018 0520   PCO2ART 24.0 (L) 12/01/2018 0520   PO2ART 319.0 (H) 12/01/2018 0520   HCO3 8.5 (L) 12/01/2018 0520   TCO2 9 (L) 12/01/2018 0520   ACIDBASEDEF 19.0 (H) 12/01/2018 0520   O2SAT 100.0 12/01/2018 0520     Coagulation Profile: Recent Labs  Lab 11/07/2018 0917 12/01/18 0217  INR 1.0 1.8*    Cardiac Enzymes: No results for input(s): CKTOTAL, CKMB, CKMBINDEX, TROPONINI in the last 168 hours.  HbA1C: No results found for: HGBA1C  CBG: Recent Labs  Lab 12/01/18 1550 12/01/18 2029 12/01/18 2317 12/02/18 0342 12/02/18 0821  GLUCAP 75 70 97 122* 124*    Imaging: Based on OSH reports: TTE showed mildly reduced left ventricular systolic function with LVEF 45%. Of note, LHC at OSH 05/2018 showed nonobstructive CAD per documentation    Critical care time:   The patient is critically ill with multiple organ systems failure and requires high complexity decision making for assessment and support, frequent evaluation and titration of therapies, application of advanced monitoring technologies and extensive interpretation of multiple databases.   Critical Care Time devoted to patient care services described in this note is 57 minutes. This time reflects the time of my personal involvement. This  critical care time does not reflect separately billable procedures or procedure time, teaching time or supervisory time of PA/NP/Med student/Med Resident etc but could involve care discussion time.  [facility] 12/02/2018 9:06 AM  Pager: [phone] After hours pager: [phone]

## 2018-12-02 NOTE — Progress Notes (Signed)
[facility] Progress Note  Subjective: seen in ICU, more awake today, still lethargic. Mother at bedside.   Vitals:   12/02/18 0800 12/02/18 0806 12/02/18 0807 12/02/18 1207  BP:  (!) 104/54  124/65  Pulse: 86 85  95  Resp: '18 18  16  ' Temp:   98.3 F (36.8 C)   TempSrc:   Oral   SpO2: 100% 100% 100% 100%  Weight:      Height:        Inpatient medications: . aspirin  81 mg Per Tube Daily  . B-complex with vitamin C  1 tablet Per Tube Daily  . chlorhexidine gluconate (MEDLINE KIT)  15 mL Mouth Rinse BID  . Chlorhexidine Gluconate Cloth  6 each Topical Daily  . cycloSPORINE  75 mg Per Tube BID  . feeding supplement (PRO-STAT SUGAR FREE 64)  30 mL Per Tube Daily  . mouth rinse  15 mL Mouth Rinse 10 times per day  . pantoprazole (PROTONIX) IV  40 mg Intravenous Daily  . predniSONE  5 mg Per Tube Q breakfast  . sulfamethoxazole-trimethoprim  10 mL Per Tube Q M,W,F-2000  . valganciclovir  450 mg Oral Q M,W,F-2000  . vancomycin  500 mg Intravenous Q M,W,F-HD   . sodium chloride Stopped (12/01/18 1744)  . ceFEPime (MAXIPIME) IV Stopped (12/01/18 2051)  . feeding supplement (VITAL 1.5 CAL) 1,000 mL (12/01/18 1745)  . fentaNYL infusion INTRAVENOUS 200 mcg/hr (12/02/18 0800)  . heparin 950 Units/hr (12/02/18 0800)  . norepinephrine (LEVOPHED) Adult infusion Stopped (12/02/18 0131)   sodium chloride, acetaminophen **OR** acetaminophen, albuterol, docusate, fentaNYL (SUBLIMAZE) injection, polyethylene glycol    Exam: Frail adult female on vent, eyes open and close, not following commands   No jVD   Chest cta bilat no rales   Cor reg no mrg   Abd soft ntnd  No ascites   Ext RUE swelling, no LE edema   LUE AVF +bruit   NF, awake but very sleepy    Dialysis: [facility] M[facility]   4h  400/800  2/2 bath  P4  42.5kg  Hep 2000  LUE AVF  M- 225 q2wk, last 11/24  Recent labs: Hb 10.3, ca 10.5, P 4.9   Assessment/ Plan: 1. S/p asystolic cardiac arrest / bradycardia: Required 11  minutes of CPR on 11/28/2018. Initially bradycardic, externally paced and started on bicarb/epi/heparin. Got HD yesterday w/ no fluid off and tolerated well.  Troponins elevated post arrest. Concern for possible PE/myocarditis per notes. On empiric antibiotics and blood cultures pending.  2. AMS:  Unclear etiology. Complaint with dialysis and BUN 32 on admit, more alert today.  3.  ESRD:  MWF HD. Had HD in ICU yesterday, next HD Monday.  4.  Hypertension/volume: Beta blocker on hold due to bradycardia overnight. No vol on exam except focal RUE edema. Will follow.  5.  Anemia: Hgb 10's to 12's. Not due for ESA. No bleeding noted.  6.  Metabolic bone disease: Calcium 10.1 corrected. Not on VDRA. Phos 8.3. Will resume binder once patient is eating.   Erin Morgan 12/02/2018, 1:42 PM  Iron/TIBC/Ferritin/ %Sat    Component Value Date/Time   IRON 42 07/17/2014 2244   TIBC 290 07/17/2014 2244   FERRITIN 61 07/17/2014 2244   IRONPCTSAT 14 07/17/2014 2244   Recent Labs  Lab 12/01/18 0217  12/01/18 0810  12/01/18 1615 12/02/18 0339  NA  --    < > 134*  --  136  --   K  --    < >  5.7*  --  3.7  --   CL  --    < > 91*  --  96*  --   CO2  --    < > 8*  --  23  --   GLUCOSE  --    < > 117*  --  71  --   BUN  --    < > 44*  --  9  --   CREATININE  --    < > 5.15*  --  1.90*  --   CALCIUM  --    < > 8.8*  --  8.0*  --   PHOS  --   --   --    < > 3.7 3.9  ALBUMIN  --    < > 2.4*  --   --   --   INR 1.8*  --   --   --   --   --    < > = values in this interval not displayed.   Recent Labs  Lab 12/01/18 0810  AST 2,761*  ALT 1,386*  ALKPHOS 83  BILITOT 2.4*  PROT 4.9*   Recent Labs  Lab 12/02/18 0339  WBC 12.4*  HGB 8.9*  HCT 27.9*  PLT 145*

## 2018-12-02 NOTE — Progress Notes (Signed)
SLP Cancellation Note  Patient Details Name: IYMONA NEWFIELD MRN: [id number] DOB: [birth year]   Cancelled treatment:       Reason Eval/Treat Not Completed: Medical issues which prohibited therapy   Talbert Nan 12/02/2018, 9:24 AM  Pollyann Glen, M.A. [facility] Environmental education officer [phone] Office [phone]

## 2018-12-03 DIAGNOSIS — R778 Other specified abnormalities of plasma proteins: Secondary | ICD-10-CM | POA: Diagnosis not present

## 2018-12-03 DIAGNOSIS — I420 Dilated cardiomyopathy: Secondary | ICD-10-CM | POA: Diagnosis not present

## 2018-12-03 DIAGNOSIS — N186 End stage renal disease: Secondary | ICD-10-CM | POA: Diagnosis not present

## 2018-12-03 DIAGNOSIS — I469 Cardiac arrest, cause unspecified: Secondary | ICD-10-CM | POA: Diagnosis not present

## 2018-12-03 LAB — BASIC METABOLIC PANEL
Anion gap: 16 — ABNORMAL HIGH (ref 5–15)
BUN: 44 mg/dL — ABNORMAL HIGH (ref 8–23)
CO2: 22 mmol/L (ref 22–32)
Calcium: 8.2 mg/dL — ABNORMAL LOW (ref 8.9–10.3)
Chloride: 97 mmol/L — ABNORMAL LOW (ref 98–111)
Creatinine, Ser: 3.59 mg/dL — ABNORMAL HIGH (ref 0.44–1.00)
GFR calc Af Amer: 15 mL/min — ABNORMAL LOW (ref >60)
GFR calc non Af Amer: 13 mL/min — ABNORMAL LOW (ref >60)
Glucose, Bld: 152 mg/dL — ABNORMAL HIGH (ref 70–99)
Potassium: 3.6 mmol/L (ref 3.5–5.1)
Sodium: 135 mmol/L (ref 135–145)

## 2018-12-03 LAB — CBC
HCT: 25.8 % — ABNORMAL LOW (ref 36.0–46.0)
Hemoglobin: 8.3 g/dL — ABNORMAL LOW (ref 12.0–15.0)
MCH: 31.7 pg (ref 26.0–34.0)
MCHC: 32.2 g/dL (ref 30.0–36.0)
MCV: 98.5 fL (ref 80.0–100.0)
Platelets: 125 10*3/uL — ABNORMAL LOW (ref 150–400)
RBC: 2.62 MIL/uL — ABNORMAL LOW (ref 3.87–5.11)
RDW: 19.6 % — ABNORMAL HIGH (ref 11.5–15.5)
WBC: 8.7 10*3/uL (ref 4.0–10.5)
nRBC: 3.8 % — ABNORMAL HIGH (ref 0.0–0.2)

## 2018-12-03 LAB — TROPONIN I (HIGH SENSITIVITY)
Troponin I (High Sensitivity): 12203 ng/L (ref <18)
Troponin I (High Sensitivity): 11210 ng/L (ref <18)

## 2018-12-03 LAB — GLUCOSE, CAPILLARY
Glucose-Capillary: 141 mg/dL — ABNORMAL HIGH (ref 70–99)
Glucose-Capillary: 143 mg/dL — ABNORMAL HIGH (ref 70–99)
Glucose-Capillary: 169 mg/dL — ABNORMAL HIGH (ref 70–99)
Glucose-Capillary: 147 mg/dL — ABNORMAL HIGH (ref 70–99)
Glucose-Capillary: 162 mg/dL — ABNORMAL HIGH (ref 70–99)
Glucose-Capillary: 153 mg/dL — ABNORMAL HIGH (ref 70–99)

## 2018-12-03 LAB — HEPARIN LEVEL (UNFRACTIONATED)
Heparin Unfractionated: 0.42 IU/mL (ref 0.30–0.70)
Heparin Unfractionated: 0.73 IU/mL — ABNORMAL HIGH (ref 0.30–0.70)

## 2018-12-03 MED ORDER — DEXMEDETOMIDINE HCL IN NACL 400 MCG/100ML IV SOLN
0.4000 ug/kg/h | INTRAVENOUS | Status: DC
  Administered 2018-12-03: 0.4 ug/kg/h via INTRAVENOUS
  Administered 2018-12-04 – 2018-12-05 (×2): 0.3 ug/kg/h via INTRAVENOUS
  Filled 2018-12-03 (×3): qty 100

## 2018-12-03 MED ORDER — CHLORHEXIDINE GLUCONATE CLOTH 2 % EX PADS
6.0000 | MEDICATED_PAD | Freq: Every day | CUTANEOUS | Status: DC
  Administered 2018-12-04 – 2018-12-06 (×3): 6 via TOPICAL

## 2018-12-03 NOTE — Progress Notes (Signed)
[facility] Progress Note  Subjective: seen in ICU, not responding well    Vitals:   12/03/18 1000 12/03/18 1100 12/03/18 1114 12/03/18 1200  BP:   (!) 154/74   Pulse: 100 (!) 101 (!) 105 94  Resp: 18 (!) '21 18 18  ' Temp:      TempSrc:      SpO2: 100% 100% 100% 100%  Weight:      Height:        Inpatient medications: . aspirin  81 mg Per Tube Daily  . B-complex with vitamin C  1 tablet Per Tube Daily  . chlorhexidine gluconate (MEDLINE KIT)  15 mL Mouth Rinse BID  . Chlorhexidine Gluconate Cloth  6 each Topical Daily  . cycloSPORINE  75 mg Per Tube BID  . feeding supplement (PRO-STAT SUGAR FREE 64)  30 mL Per Tube Daily  . mouth rinse  15 mL Mouth Rinse 10 times per day  . pantoprazole (PROTONIX) IV  40 mg Intravenous Daily  . predniSONE  5 mg Per Tube Q breakfast  . sulfamethoxazole-trimethoprim  10 mL Per Tube Q M,W,F-2000  . valganciclovir  450 mg Oral Q M,W,F-2000   . sodium chloride Stopped (12/01/18 1744)  . dexmedetomidine (PRECEDEX) IV infusion 0.4 mcg/kg/hr (12/03/18 1104)  . feeding supplement (VITAL 1.5 CAL) 1,000 mL (12/03/18 0416)  . fentaNYL infusion INTRAVENOUS Stopped (12/03/18 0759)  . heparin 1,100 Units/hr (12/03/18 1328)  . norepinephrine (LEVOPHED) Adult infusion Stopped (12/02/18 0813)   sodium chloride, acetaminophen **OR** acetaminophen, albuterol, docusate, fentaNYL (SUBLIMAZE) injection, polyethylene glycol    Exam: Frail adult female on vent, eyes open and close, not following commands   No jVD   Chest cta bilat no rales   Cor reg no mrg   Abd soft ntnd  No ascites   Ext RUE swelling, no LE edema   LUE AVF +bruit   NF, awake but very sleepy    Dialysis: [facility] M[facility]   4h  400/800  2/2 bath  P4  42.5kg  Hep 2000  LUE AVF  M- 225 q2wk, last 11/24  Recent labs: Hb 10.3, ca 10.5, P 4.9     Cxr 11/27 - no edema  Assessment/ Plan: 1. S/p asystolic cardiac arrest / bradycardia: Required 11 minutes of CPR on 11/22/2018. Initially  bradycardic, externally paced and started on bicarb/epi/heparin. Got HD 11/27 post-arrest in ICU w/ no fluid off and tolerated well.  Troponins elevated pre (900) and post (12,000 peak) arrest. Concern for possible PE/myocarditis per notes. Seen by cardiology. On empiric antibiotics, blood cx's negative.  2. AMS:  Unclear etiology. Patient is post-arrest on 11/26. Compliant with dialysis and BUN 32 on admit. Not responding well today. Pt's mother said she had similar AMS episode post-transplant recently at [facility] took 8 days to recover.  3.  ESRD:  MWF HD. Had HD in ICU 11/27, next HD tomorrow.   4.  Hypertension/volume: Beta blocker on hold due to bradycardia post arrest. Per cards. Up 7kg by wts, max UF on HD tomorrow.  5.  Anemia: Hgb 10's to 12's. Not due for ESA. No bleeding noted.  6.  Metabolic bone disease: Calcium 10.1 corrected. Not on VDRA. Phos 8.3. Will resume binder once patient is eating. 7.  Recent failed renal Transplant attempt: Sept/ Oct at [facility], primary nonfunction, is getting her CyA and prednisone here. Will get noncon abd CT to check status of transplant.    Rob Doctor, hospital 12/03/2018, 1:37 PM  Iron/TIBC/Ferritin/ %Sat  Component Value Date/Time   IRON 42 07/17/2014 2244   TIBC 290 07/17/2014 2244   FERRITIN 61 07/17/2014 2244   IRONPCTSAT 14 07/17/2014 2244   Recent Labs  Lab 12/01/18 0217  12/01/18 0810  12/02/18 1603 12/03/18 0630  NA  --    < > 134*   < >  --  135  K  --    < > 5.7*   < >  --  3.6  CL  --    < > 91*   < >  --  97*  CO2  --    < > 8*   < >  --  22  GLUCOSE  --    < > 117*   < >  --  152*  BUN  --    < > 44*   < >  --  44*  CREATININE  --    < > 5.15*   < >  --  3.59*  CALCIUM  --    < > 8.8*   < >  --  8.2*  PHOS  --   --   --    < > 4.2  --   ALBUMIN  --    < > 2.4*  --   --   --   INR 1.8*  --   --   --   --   --    < > = values in this interval not displayed.   Recent Labs  Lab 12/01/18 0810  AST 2,761*  ALT 1,386*  ALKPHOS 83   BILITOT 2.4*  PROT 4.9*   Recent Labs  Lab 12/03/18 0630  WBC 8.7  HGB 8.3*  HCT 25.8*  PLT 125*

## 2018-12-03 NOTE — Progress Notes (Signed)
NAME:  Erin Morgan, MRN:  [id number], DOB:  12-16-1955, LOS: 3 ADMISSION DATE:  11/22/2018, CONSULTATION DATE:  12/01/2018 REFERRING MD:  Dr. Doristine Bosworth CHIEF COMPLAINT:  Cardiac arrest  Brief History   This is a 63 year old woman with history of end-stage renal disease, with a recent admission for symptomatic anemia requiring transfusion.  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  She subsequently developed graft failure and is now back on dialysis.  She was having weakness and lethargy at home, and did complete her full run of dialysis on Tuesday.  She was admitted on 11/26 for weakness and lethargy.  Shortly after admission she developed cardiac arrest with asystole and received ACLS protocol for 11 minutes.  She was intubated and was subsequently bradycardic, for which she was placed on a dopamine infusion with minimal benefit.  She was then transcutaneously paced.  When neither of these work she was transitioned to an epinephrine drip.  She was admitted to the [facility] ICU and PCCM was consulted for further management.  Consults:  Nephrology  Procedures:   11/27 Intubation 11/27 arterial line 11/27 central line Micro Data:  Blood cultures 11/26 drawn and ngtd  Antimicrobials:  11/27 cefepime>> 11/29 11/27 vancomycin>> 11/29  Interim history/subjective:  Overnight no acute issues.  Still lethargic this morning after fentanyl was held.  Objective   Blood pressure 117/60, pulse 99, temperature (!) 100.8 F (38.2 C), temperature source Axillary, resp. rate 17, height 5\' 4"  (1.626 m), weight 49.9 kg, SpO2 100 %.    Vent Mode: PRVC FiO2 (%):  [30 %-40 %] 30 % Set Rate:  [18 bmp] 18 bmp Vt Set:  [430 mL] 430 mL PEEP:  [5 cmH20] 5 cmH20 Pressure Support:  [10 [id number] cmH20] 10 cmH20 Plateau Pressure:  [15 cmH20-17 cmH20] 15 cmH20   Intake/Output Summary (Last 24 hours) at 12/03/2018 0847 Last  data filed at 12/03/2018 0600 Gross per 24 hour  Intake 1339.49 ml  Output -  Net 1339.49 ml   Filed Weights   12/01/18 1125 12/02/18 0343 12/03/18 0500  Weight: 48.1 kg 49.7 kg 49.9 kg    Examination: General: Intubated, resting comfortably HENT: at/[place], mmm, ett in place Lungs: No increased wob, lungs are clear, no wheezes or crackles Cardiovascular: tachycardic, regular, no mrg Abdomen: scaphoid, soft Extremities: LUE AVF with palpable thrill and bruit.  Neuro: Follows commands, moves all 4 extremities, denies pain.  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  She is also still taking cyclosporine, and Valcyte.  Heparin gtt -we will continue for now pending cardiology recommendations.  Her troponin is still rising, although she is end-stage renal.  She has been unable to get a cardiac MRI to evaluate for infiltrative heart disease or myocarditis due to her end-stage renal disease.  Her CT PE study was negative.  Currently unclear what the etiology of her cardiac arrest  was.  EP has also been consulted, the transient heart block was likely either metabolic or hypoxemic in nature, no need for any permanent pacemaker at this time.  Continue tube feeds Discussed with nephrology team, plan to continue HD Monday Wednesday Friday.  Her  mental status today prohibited extubation.  We will discontinue fentanyl, continue as needed pushes, Precedex may be a better option for her agitation and sedation.  We will need to closely monitor her QRS and this medication should not be bolused.  Best practice:  Diet: Continue tube feeds Pain/Anxiety/Delirium protocol (if indicated): fentanyl infusion and prn VAP protocol (if indicated): ordered. DVT prophylaxis: heparin gtt GI prophylaxis: PPI Glucose control: Controlled, under 180 Foley anuric Mobility: bed rest Code Status: Full Code Family Communication: Mother updated at bedside. Disposition: needs ICU  Labs   I have personally reviewed her CT PE study from 11/27.  There is no evidence of acute pulmonary embolism, or right heart strain.  CBC: Recent Labs  Lab 11/27/2018 0917  12/01/18 0144 12/01/18 0217 12/01/18 0245 12/01/18 0520 12/02/18 0339 12/03/18 0630  WBC 13.4*  --  14.5* 23.9*  --   --  12.4* 8.7  NEUTROABS 10.5*  --   --   --   --   --   --   --   HGB 10.8*   < > 7.8* 10.7* 11.2* 12.2 8.9* 8.3*  HCT 35.8*   < > 24.9* 33.6* 33.0* 36.0 27.9* 25.8*  MCV 106.2*  --  103.8* 102.1*  --   --  98.6 98.5  PLT 337  --  182 272  --   --  145* 125*   < > = values in this interval not displayed.    Basic Metabolic Panel: Recent Labs  Lab 12/01/18 0106  12/01/18 0144  12/01/18 0304 12/01/18 0520 12/01/18 0810 12/01/18 1230 12/01/18 1615 12/02/18 0339 12/02/18 1603 12/03/18 0630  NA  --    < > 138   < > 132* 126* 134*  --  136  --   --  135  K  --    < > 3.7   < > 5.5* 5.6* 5.7*  --  3.7  --   --  3.6  CL  --   --  112*  --  90*  --  91*  --  96*  --   --  97*  CO2  --   --  <7*  --  11*  --  8*  --  23  --   --  22  GLUCOSE  --   --  28*  --  179*  --  117*  --  71  --   --  152*  BUN  --   --  31*  --  42*  --  44*  --  9  --   --  44*  CREATININE  --   --  3.04*  --  4.95*  --  5.15*  --  1.90*  --   --  3.59*  CALCIUM  --   --  5.6*  --  8.5*  --  8.8*   --  8.0*  --   --  8.2*  MG 2.1  --   --   --   --   --   --  1.8 1.7 3.1* 3.1*  --   PHOS 8.3*  --   --   --   --   --   --  4.7* 3.7 3.9 4.2  --    < > = values in this interval not displayed.   GFR: Estimated Creatinine Clearance: 12.5 mL/min (A) (by C-G formula based on SCr of 3.59 mg/dL (H)). Recent Labs  Lab 12/01/18 0144 12/01/18 0217 12/01/18 0800 12/01/18 1615 12/02/18 0339 12/03/18 0630  WBC 14.5* 23.9*  --   --  12.4* 8.7  LATICACIDVEN  --   --  >11.0* 4.0*  --   --     Liver Function Tests: Recent Labs  Lab 11/07/2018 0917 12/01/18 0106 12/01/18 0304 12/01/18 0810  AST 37 985* 1,664* 2,761*  ALT 44 591* 962* 1,386*  ALKPHOS 62 90 73 83  BILITOT 1.1 1.1 1.5* 2.4*  PROT 5.3* 5.2* 4.6* 4.9*  ALBUMIN 2.7* 2.5* 2.4* 2.4*   No results for input(s): LIPASE, AMYLASE in the last 168 hours. Recent Labs  Lab 12/01/18 0106  AMMONIA 140*    ABG    Component Value Date/Time   PHART 7.155 (LL) 12/01/2018 0520   PCO2ART 24.0 (L) 12/01/2018 0520   PO2ART 319.0 (H) 12/01/2018 0520   HCO3 8.5 (L) 12/01/2018 0520   TCO2 9 (L) 12/01/2018 0520   ACIDBASEDEF 19.0 (H) 12/01/2018 0520   O2SAT 100.0 12/01/2018 0520     Coagulation Profile: Recent Labs  Lab 11/29/2018 0917 12/01/18 0217  INR 1.0 1.8*    Cardiac Enzymes: No results for input(s): CKTOTAL, CKMB, CKMBINDEX, TROPONINI in the last 168 hours.  HbA1C: No results found for: HGBA1C  CBG: Recent Labs  Lab 12/02/18 1610 12/02/18 2000 12/02/18 2353 12/03/18 0418 12/03/18 0746  GLUCAP 131* 143* 148* 141* 143*    Imaging: Based on OSH reports: TTE showed mildly reduced left ventricular systolic function with LVEF 45%. Of note, LHC at OSH 05/2018 showed nonobstructive CAD per documentation    Critical care time:   The patient is critically ill with multiple organ systems failure and requires high complexity decision making for assessment and support, frequent evaluation and titration of therapies,  application of advanced monitoring technologies and extensive interpretation of multiple databases.   Critical Care Time devoted to patient care services described in this note is 49 minutes. This time reflects the time of my personal involvement. This critical care time does not reflect separately billable procedures or procedure time, teaching time or supervisory time of PA/NP/Med student/Med Resident etc but could involve care discussion time.  [facility] 12/03/2018 8:47 AM  Pager: [phone] After hours pager: [phone]

## 2018-12-03 NOTE — Progress Notes (Signed)
ANTICOAGULATION CONSULT NOTE  Pharmacy Consult:  Heparin Indication: ACS   Patient Measurements: Height: 5\' 4"  (162.6 cm) Weight: 110 lb 0.2 oz (49.9 kg) IBW/kg (Calculated) : 54.7 Heparin Dosing Weight: 49.7 kg  Vital Signs: Temp: 99.8 F (37.7 C) (11/29 1606) Temp Source: Axillary (11/29 1606) BP: 97/55 (11/29 1606) Pulse Rate: 81 (11/29 1700)  Labs: Recent Labs    12/01/18 0217  12/01/18 0520 12/01/18 0810  12/01/18 1615  12/02/18 0339  12/02/18 2130 12/03/18 0630 12/03/18 0951 12/03/18 1701  HGB 10.7*   < > 12.2  --   --   --   --  8.9*  --   --  8.3*  --   --   HCT 33.6*   < > 36.0  --   --   --   --  27.9*  --   --  25.8*  --   --   PLT 272  --   --   --   --   --   --  145*  --   --  125*  --   --   LABPROT 20.6*  --   --   --   --   --   --   --   --   --   --   --   --   INR 1.8*  --   --   --   --   --   --   --   --   --   --   --   --   HEPARINUNFRC  --   --   --   --    < >  --    < >  --    < > 0.18* 0.42  --  0.73*  CREATININE  --    < >  --  5.15*  --  1.90*  --   --   --   --  3.59*  --   --   TROPONINIHS  --   --   --   --    < > 7,503*  --   --   --   --  12,203* 11,210*  --    < > = values in this interval not displayed.    Estimated Creatinine Clearance: 12.5 mL/min (A) (by C-G formula based on SCr of 3.59 mg/dL (H)).   Assessment: 63 yo female with ESRD on HD - due to failed kidney transplant. Pt is admitted due to altered mental status. Heparin infusion has been ordered due to elevated troponin, which is likely due to demand ischemia from cardiac arrest per Cards.  Heparin level this morning is supratherapeutic (HL 0.73, goal of 0.3-0.7). Hgb low but stable from yesterday - no active bleeding noted. Heparin level drawn from A-line and heparin infusion is running peripherally.   Goal of Therapy:  Heparin level 0.3-0.7 units/ml Monitor platelets by anticoagulation protocol: Yes   Plan:  -Reduce heparin infusion to 1000 units/hr (10  ml/h) -Will continue to monitor for any signs/symptoms of bleeding and will follow up with heparin level in 8 hours to confirm therapeutic  Thank you for allowing pharmacy to be a part of this patient's care.  Antonietta Jewel, PharmD, BCCCP Clinical Pharmacist  Phone: [phone]  Please check AMION for all [facility] [place] phone numbers After 10:00 PM, call [place] [phone] 12/03/2018 5:46 PM   **Pharmacist phone directory can now be found on [email or web address] ([email or web address]).  Listed under [facility]  Pharmacy.

## 2018-12-03 NOTE — Evaluation (Signed)
SLP Cancellation Note  Patient Details Name: Erin Morgan MRN: [id number] DOB: 1955/12/15   Cancelled treatment:       Reason Eval/Treat Not Completed: Medical issues which prohibited therapy(pt remains on vent)   Macario Golds 12/03/2018, 7:13 AM

## 2018-12-03 NOTE — Progress Notes (Signed)
ANTICOAGULATION CONSULT NOTE  Pharmacy Consult:  Heparin Indication: ACS   Patient Measurements: Height: 5\' 4"  (162.6 cm) Weight: 110 lb 0.2 oz (49.9 kg) IBW/kg (Calculated) : 54.7 Heparin Dosing Weight: 49.7 kg  Vital Signs: Temp: 100.8 F (38.2 C) (11/29 0743) Temp Source: Axillary (11/29 0743) BP: 117/60 (11/29 0756) Pulse Rate: 100 (11/29 1000)  Labs: Recent Labs    12/01/18 0217  12/01/18 0520 12/01/18 0810  12/01/18 1615  12/02/18 0339 12/02/18 0829 12/02/18 2130 12/03/18 0630 12/03/18 0951  HGB 10.7*   < > 12.2  --   --   --   --  8.9*  --   --  8.3*  --   HCT 33.6*   < > 36.0  --   --   --   --  27.9*  --   --  25.8*  --   PLT 272  --   --   --   --   --   --  145*  --   --  125*  --   LABPROT 20.6*  --   --   --   --   --   --   --   --   --   --   --   INR 1.8*  --   --   --   --   --   --   --   --   --   --   --   HEPARINUNFRC  --   --   --   --    < >  --    < >  --  <0.10* 0.18* 0.42  --   CREATININE  --    < >  --  5.15*  --  1.90*  --   --   --   --  3.59*  --   TROPONINIHS  --   --   --   --    < > 7,503*  --   --   --   --  12,203* 11,210*   < > = values in this interval not displayed.    Estimated Creatinine Clearance: 12.5 mL/min (A) (by C-G formula based on SCr of 3.59 mg/dL (H)).   Assessment: 63 yo female with ESRD on HD - due to failed kidney transplant. Pt is admitted due to altered mental status. Heparin infusion has been ordered due to elevated troponin, which is likely due to demand ischemia from cardiac arrest per Cards.  Heparin level this morning is therapeutic (HL 0.42, goal of 0.3-0.7). Hgb low but stable from yesterday - no active bleeding noted.   Goal of Therapy:  Heparin level 0.3-0.7 units/ml Monitor platelets by anticoagulation protocol: Yes   Plan:  - Continue Heparin at 1100 units/hr (11 ml/h) - Will continue to monitor for any signs/symptoms of bleeding and will follow up with heparin level in 8 hours to confirm  therapeutic  Thank you for allowing pharmacy to be a part of this patient's care.  Alycia Rossetti, PharmD, BCPS Clinical Pharmacist Clinical phone for 12/03/2018: [phone] 12/03/2018 10:51 AM   **Pharmacist phone directory can now be found on [email or web address] ([email or web address]).  Listed under [place].

## 2018-12-03 NOTE — Progress Notes (Addendum)
Progress Note  Patient Name: Erin Morgan Date of Encounter: 12/03/2018  Primary Cardiologist: Mertie Moores, MD   Subjective   Remains intubated.  Denies any chest pain.  Maintaining NSR on tele.   Inpatient Medications    Scheduled Meds:  aspirin  81 mg Per Tube Daily   B-complex with vitamin C  1 tablet Per Tube Daily   chlorhexidine gluconate (MEDLINE KIT)  15 mL Mouth Rinse BID   Chlorhexidine Gluconate Cloth  6 each Topical Daily   cycloSPORINE  75 mg Per Tube BID   feeding supplement (PRO-STAT SUGAR FREE 64)  30 mL Per Tube Daily   mouth rinse  15 mL Mouth Rinse 10 times per day   pantoprazole (PROTONIX) IV  40 mg Intravenous Daily   predniSONE  5 mg Per Tube Q breakfast   sulfamethoxazole-trimethoprim  10 mL Per Tube Q M,W,F-2000   valganciclovir  450 mg Oral Q M,W,F-2000   vancomycin  500 mg Intravenous Q M,W,F-HD   Continuous Infusions:  sodium chloride Stopped (12/01/18 1744)   ceFEPime (MAXIPIME) IV Stopped (12/01/18 2051)   feeding supplement (VITAL 1.5 CAL) 1,000 mL (12/03/18 0416)   fentaNYL infusion INTRAVENOUS 150 mcg/hr (12/03/18 0415)   heparin 1,100 Units/hr (12/03/18 0400)   norepinephrine (LEVOPHED) Adult infusion Stopped (12/02/18 0813)   PRN Meds: sodium chloride, acetaminophen **OR** acetaminophen, albuterol, docusate, fentaNYL (SUBLIMAZE) injection, polyethylene glycol   Vital Signs    Vitals:   12/03/18 0336 12/03/18 0400 12/03/18 0500 12/03/18 0600  BP: 117/60     Pulse: 92 89 93 94  Resp: _0 Temp:  98.7 F (37.1 C)    TempSrc:  Oral    SpO2: 100% 100% 100% 100%  Weight:   49.9 kg   Height:        Intake/Output Summary (Last 24 hours) at 12/03/2018 0706 Last data filed at 12/03/2018 0600 Gross per 24 hour  Intake 1368.96 ml  Output --  Net 1368.96 ml   Filed Weights   12/01/18 1125 12/02/18 0343 12/03/18 0500  Weight: 48.1 kg 49.7 kg 49.9 kg    Telemetry    NSR - Personally  Reviewed  ECG    No new EKG to review- Personally Reviewed  Physical Exam   GEN: intubated, ill appearing HEENT: Normal NECK: No JVD; No carotid bruits LYMPHATICS: No lymphadenopathy CARDIAC:RRR, no murmurs, rubs, gallops RESPIRATORY:  Clear to auscultation without rales, wheezing or rhonchi  ABDOMEN: Soft, non-tender, non-distended MUSCULOSKELETAL:  No edema; No deformity  SKIN: Warm and dry NEUROLOGIC:  Cannot assess PSYCHIATRIC:  Cannot assess   Labs    Chemistry Recent Labs  Lab 12/01/18 0106  12/01/18 0304 12/01/18 0520 12/01/18 0810 12/01/18 1615  NA  --    < > 132* 126* 134* 136  K  --    < > 5.5* 5.6* 5.7* 3.7  CL  --    < > 90*  --  91* 96*  CO2  --    < > 11*  --  8* 23  GLUCOSE  --    < > 179*  --  117* 71  BUN  --    < > 42*  --  44* 9  CREATININE  --    < > 4.95*  --  5.15* 1.90*  CALCIUM  --    < > 8.5*  --  8.8* 8.0*  PROT 5.2*  --  4.6*  --  4.9*  --   ALBUMIN  2.5*  --  2.4*  --  2.4*  --   AST 985*  --  1,664*  --  2,761*  --   ALT 591*  --  962*  --  1,386*  --   ALKPHOS 90  --  73  --  83  --   BILITOT 1.1  --  1.5*  --  2.4*  --   GFRNONAA  --    < > 9*  --  8* 27*  GFRAA  --    < > 10*  --  10* 32*  ANIONGAP  --    < > 31*  --  35* 17*   < > = values in this interval not displayed.     Hematology Recent Labs  Lab 12/01/18 0217  12/01/18 0520 12/02/18 0339 12/03/18 0630  WBC 23.9*  --   --  12.4* PENDING  RBC 3.29*  --   --  2.83* 2.62*  HGB 10.7*   < > 12.2 8.9* 8.3*  HCT 33.6*   < > 36.0 27.9* 25.8*  MCV 102.1*  --   --  98.6 98.5  MCH 32.5  --   --  31.4 31.7  MCHC 31.8  --   --  31.9 32.2  RDW 19.0*  --   --  19.2* 19.6*  PLT 272  --   --  145* 125*   < > = values in this interval not displayed.    Cardiac EnzymesNo results for input(s): TROPONINI in the last 168 hours. No results for input(s): TROPIPOC in the last 168 hours.   BNPNo results for input(s): BNP, PROBNP in the last 168 hours.   DDimer  Recent Labs  Lab  12/01/18 1230  DDIMER >20.00*     Radiology    Ct Angio Chest Pe W Or Wo Contrast  Result Date: 12/01/2018 CLINICAL DATA:  Cardiac arrest with concern for pulmonary embolism. EXAM: CT ANGIOGRAPHY CHEST WITH CONTRAST TECHNIQUE: Multidetector CT imaging of the chest was performed using the standard protocol during bolus administration of intravenous contrast. Multiplanar CT image reconstructions and MIPs were obtained to evaluate the vascular anatomy. CONTRAST:  61m OMNIPAQUE IOHEXOL 350 MG/ML SOLN COMPARISON:  CT chest dated July 18, 2017. FINDINGS: Cardiovascular: Contrast injection is sufficient to demonstrate satisfactory opacification of the pulmonary arteries to the segmental level. There is no pulmonary embolus. The main pulmonary artery is normal. There is no CT evidence of acute right heart strain. There are atherosclerotic changes of the visualized thoracic aorta without evidence for an aneurysm. Heart size is enlarged. There is no pericardial effusion. Atherosclerotic changes are noted of the coronary arteries. There is some mild reflux into the IVC. Mediastinum/Nodes: --No mediastinal or hilar lymphadenopathy. --No axillary lymphadenopathy. --No supraclavicular lymphadenopathy. --there is a multinodular goiter. There is a dominant left-sided thyroid nodule measuring approximately 1.5 cm. --the enteric tube courses through the esophagus and terminates below the left hemidiaphragm. The tip is not visualized on this exam. Lungs/Pleura: There is bibasilar airspace disease favored to represent a combination of atelectasis and consolidation, especially on the left. The endotracheal tube terminates above the carina. There is no pneumothorax. There is a moderate right and small left pleural effusion. Upper Abdomen: There is a small amount of free fluid in the upper abdomen. There are innumerable calcifications in the spleen and liver likely related to a prior granulomatous infection. Musculoskeletal:  There are bilateral nondisplaced rib fractures likely related to cardiopulmonary resuscitation. There appears to be a  fracture involving the inferior sternum that is poorly evaluated secondary to motion artifact at this level. Review of the MIP images confirms the above findings. IMPRESSION: 1. There is no evidence for acute pulmonary embolus. 2. There is bibasilar airspace disease favored to represent a combination of atelectasis and consolidation. 3. Bilateral nondisplaced rib fractures likely related to cardiopulmonary resuscitation. There appears to be a fracture involving the inferior sternum that is poorly evaluated secondary to motion artifact at this level. No pneumothorax identified on this exam. 4. Bilateral pleural effusions, right greater than left. 5. Cardiomegaly. 6. Multinodular goiter with a dominant left-sided thyroid nodule measuring approximately 1.5 cm. Further evaluation with nonemergent outpatient thyroid ultrasound is recommended if not already performed. 7. Small amount of free fluid in the upper abdomen. Aortic Atherosclerosis (ICD10-I70.0). Electronically Signed   By: Constance Holster M.D.   On: 12/01/2018 23:08    Cardiac Studies   2D echo 12/01/2018 IMPRESSIONS    1. Left ventricular ejection fraction, by visual estimation, is 45 to 50%. The left ventricle has mildly decreased function. There is moderately increased left ventricular hypertrophy.  2. Left ventricular diastolic parameters are consistent with Grade III diastolic dysfunction (restrictive).  3. Global right ventricle has normal systolic function.The right ventricular size is normal.  4. Left atrial size was moderately dilated.  5. Right atrial size was normal.  6. Moderate mitral annular calcification.  7. The mitral valve is normal in structure. Trace mitral valve regurgitation. No evidence of mitral stenosis.  8. The tricuspid valve is normal in structure. Tricuspid valve regurgitation is mild.  9. The  aortic valve is tricuspid. Aortic valve regurgitation is not visualized. Mild aortic valve stenosis. 10. The pulmonic valve was normal in structure. Pulmonic valve regurgitation is trivial. 11. Mildly elevated pulmonary artery systolic pressure. 12. The inferior vena cava is dilated in size with >50% respiratory variability, suggesting right atrial pressure of 8 mmHg. 13. Mild global reduction in LV systolic function; restrictive filling; moderate LVH; myocardium with speckled appearance (consider amyloid); mild AS (mean gradient 14 mmHg); moderate LAE; mild TR.  Patient Profile     63 y.o. female with a history of normal coronaries on cardiac catheterization in 08/3092, grade 2 diastolic dysfunction, ectopic atrial tachycardia followed by EP, hypertension, ESRD on hemodialysis, rheumatoid arthritis who presented with altered mental status felt to likely by secondary to acute metabolic encephalopathy. Cardiology consulted for elevated troponin.  Assessment & Plan    Elevated Troponin - High-sensitivity elevated at 942 >> 990 and then jumped to 1,936 after hypoxic/bradycardic arrest and continues to increase to 7503. - recheck trop this am to make sure trending downward - Normal coronaries on cath in 05/2018. - Echo shows LVEF of 45-50% with grade 3 diastolic dysfunction.  RV size and systolic function normal. Myocardium with speckled appearance ? Amyloid. - Chest CTA neg for acute PE - Currently on Heparin drip.  - Continue Aspirin. - Etiology of troponin elevation unclear at this time. Diff dx includes demand ischemia in the setting of acute hypoxia and bradycardic arrest  - CRP elevated at 8.5 and sed rate only 13 which I would expect to be higher in setting of acute myocarditis.  Bradycardic/hypoxic cardiac Arrest and CHB - Patient had acute hypoxia leading to bradycardia/CHB and cardiac arrest. Patient was intubated and required 11 minutes of CPR before ROSC was obtained. She was given  Atropine and Dopmaine with little improvement and briefly required external pacing while Epinephrine infusion was started. - Telemetry reviewed.Difficult  to assess underlying rhythm and difficult to appreciate P waves a times prior to the arrest. However, she did have an episode of complete heart block with long pauses of no ventricular escape leading up to the arrest.  - Appreciate EP recs - no indication for pacing at this time and arrhythmias likely related to acute hypoxia. - no further heart block noted on tele but occasionally cannot see P waves.  Still appears to be NSR at 90bpm with very long PR interval.  Will check EKG - Continue to monitor on telemetry.  History of Atrial Tachycardia - Previously on Diltiazem but this was held on recent admission due to soft BP and was started on Lopressor 63m twice daily instead -BB on hold  Altered mental Status - Head CT benign. - WBC up to 23.9 yesterday but now down to 12.4 - Blood cultures pending. - Management per primary team.  Leukocytosis - WBC elevated at 13.4 >> 14.5 >> 23.9 >> 12.4. - Lactic acid > 11.0 - Blood cultures pending.  - COVID negative. - Management per primary team.  Elevated Transaminases - Hepatitis panel unremarkable. - Likely shock liver. - Management per primary team.  ESRD  - Management per Nephrology.      For questions or updates, please contact CBathPlease consult [email or web address] for contact info under Cardiology/STEMI.      Signed, TFransico Him MD  12/03/2018, 7:06 AM

## 2018-12-03 NOTE — Progress Notes (Signed)
Attempted wean X2. Pt had no pt effort with wean. Pt was placed back on previous mode.

## 2018-12-04 ENCOUNTER — Telehealth: Payer: Medicare Other | Admitting: Internal Medicine

## 2018-12-04 DIAGNOSIS — R0603 Acute respiratory distress: Secondary | ICD-10-CM | POA: Diagnosis not present

## 2018-12-04 DIAGNOSIS — I471 Supraventricular tachycardia: Secondary | ICD-10-CM

## 2018-12-04 DIAGNOSIS — Z992 Dependence on renal dialysis: Secondary | ICD-10-CM | POA: Diagnosis not present

## 2018-12-04 DIAGNOSIS — N186 End stage renal disease: Secondary | ICD-10-CM | POA: Diagnosis not present

## 2018-12-04 DIAGNOSIS — R778 Other specified abnormalities of plasma proteins: Secondary | ICD-10-CM

## 2018-12-04 DIAGNOSIS — I469 Cardiac arrest, cause unspecified: Secondary | ICD-10-CM

## 2018-12-04 LAB — RENAL FUNCTION PANEL
Albumin: 2.4 g/dL — ABNORMAL LOW (ref 3.5–5.0)
Anion gap: 14 (ref 5–15)
BUN: 34 mg/dL — ABNORMAL HIGH (ref 8–23)
CO2: 22 mmol/L (ref 22–32)
Calcium: 8 mg/dL — ABNORMAL LOW (ref 8.9–10.3)
Chloride: 99 mmol/L (ref 98–111)
Creatinine, Ser: 2.5 mg/dL — ABNORMAL HIGH (ref 0.44–1.00)
GFR calc Af Amer: 23 mL/min — ABNORMAL LOW (ref >60)
GFR calc non Af Amer: 20 mL/min — ABNORMAL LOW (ref >60)
Glucose, Bld: 137 mg/dL — ABNORMAL HIGH (ref 70–99)
Phosphorus: 1.2 mg/dL — ABNORMAL LOW (ref 2.5–4.6)
Potassium: 3.7 mmol/L (ref 3.5–5.1)
Sodium: 135 mmol/L (ref 135–145)

## 2018-12-04 LAB — CBC
HCT: 28.9 % — ABNORMAL LOW (ref 36.0–46.0)
Hemoglobin: 9.1 g/dL — ABNORMAL LOW (ref 12.0–15.0)
MCH: 31.4 pg (ref 26.0–34.0)
MCHC: 31.5 g/dL (ref 30.0–36.0)
MCV: 99.7 fL (ref 80.0–100.0)
Platelets: 118 10*3/uL — ABNORMAL LOW (ref 150–400)
RBC: 2.9 MIL/uL — ABNORMAL LOW (ref 3.87–5.11)
RDW: 20 % — ABNORMAL HIGH (ref 11.5–15.5)
WBC: 7.6 10*3/uL (ref 4.0–10.5)
nRBC: 7.1 % — ABNORMAL HIGH (ref 0.0–0.2)

## 2018-12-04 LAB — GLUCOSE, CAPILLARY
Glucose-Capillary: 125 mg/dL — ABNORMAL HIGH (ref 70–99)
Glucose-Capillary: 149 mg/dL — ABNORMAL HIGH (ref 70–99)
Glucose-Capillary: 155 mg/dL — ABNORMAL HIGH (ref 70–99)
Glucose-Capillary: 156 mg/dL — ABNORMAL HIGH (ref 70–99)
Glucose-Capillary: 159 mg/dL — ABNORMAL HIGH (ref 70–99)
Glucose-Capillary: 172 mg/dL — ABNORMAL HIGH (ref 70–99)

## 2018-12-04 LAB — HEPARIN LEVEL (UNFRACTIONATED): Heparin Unfractionated: 0.98 IU/mL — ABNORMAL HIGH (ref 0.30–0.70)

## 2018-12-04 LAB — CYCLOSPORINE: Cyclosporine, LabCorp: 222 ng/mL (ref 100–400)

## 2018-12-04 MED ORDER — VASOPRESSIN 20 UNIT/ML IV SOLN
0.0300 [IU]/min | INTRAVENOUS | Status: DC
  Filled 2018-12-04: qty 2

## 2018-12-04 MED ORDER — MIDAZOLAM HCL 2 MG/2ML IJ SOLN: 2.0000 mg | Freq: Once | INTRAMUSCULAR | Status: DC

## 2018-12-04 MED ORDER — PHENYLEPHRINE HCL-NACL 10-0.9 MG/250ML-% IV SOLN
INTRAVENOUS | Status: AC
  Administered 2018-12-04: 09:00:00 15 ug/min via INTRAVENOUS
  Filled 2018-12-04: qty 250

## 2018-12-04 MED ORDER — VASOPRESSIN 20 UNIT/ML IV SOLN: 0.0300 [IU]/min | INTRAVENOUS | Status: DC

## 2018-12-04 MED ORDER — CALCIUM GLUCONATE-NACL 1-0.675 GM/50ML-% IV SOLN
1.0000 g | Freq: Once | INTRAVENOUS | Status: DC
  Filled 2018-12-04: qty 50

## 2018-12-04 MED ORDER — HEPARIN SODIUM (PORCINE) 1000 UNIT/ML DIALYSIS
2000.0000 [IU] | Freq: Once | INTRAMUSCULAR | Status: AC
  Administered 2018-12-04: 2000 [IU] via INTRAVENOUS_CENTRAL
  Filled 2018-12-04: qty 2

## 2018-12-04 MED ORDER — METOPROLOL TARTRATE 5 MG/5ML IV SOLN
2.5000 mg | INTRAVENOUS | Status: DC | PRN
  Administered 2018-12-04 (×2): 2.5 mg via INTRAVENOUS
  Administered 2018-12-05 (×2): 5 mg via INTRAVENOUS
  Administered 2018-12-06: 2.5 mg via INTRAVENOUS
  Administered 2018-12-06 (×4): 5 mg via INTRAVENOUS
  Administered 2018-12-06: 2.5 mg via INTRAVENOUS
  Administered 2018-12-07: 5 mg via INTRAVENOUS
  Filled 2018-12-04 (×9): qty 5

## 2018-12-04 MED ORDER — MAGNESIUM SULFATE 2 GM/50ML IV SOLN
2.0000 g | Freq: Once | INTRAVENOUS | Status: AC
  Administered 2018-12-04: 2 g via INTRAVENOUS

## 2018-12-04 MED ORDER — MIDAZOLAM HCL 2 MG/2ML IJ SOLN
5.0000 mg | Freq: Once | INTRAMUSCULAR | Status: AC
  Administered 2018-12-04: 2.5 mg via INTRAVENOUS
  Filled 2018-12-04: qty 6

## 2018-12-04 MED ORDER — SODIUM PHOSPHATES 45 MMOLE/15ML IV SOLN
15.0000 mmol | Freq: Once | INTRAVENOUS | Status: AC
  Administered 2018-12-04: 15 mmol via INTRAVENOUS
  Filled 2018-12-04: qty 5

## 2018-12-04 MED ORDER — HYDROCORTISONE NA SUCCINATE PF 100 MG IJ SOLR
100.0000 mg | Freq: Three times a day (TID) | INTRAMUSCULAR | Status: DC
  Administered 2018-12-04 – 2018-12-07 (×9): 100 mg via INTRAVENOUS
  Filled 2018-12-04 (×9): qty 2

## 2018-12-04 MED ORDER — METOPROLOL TARTRATE 5 MG/5ML IV SOLN
5.0000 mg | Freq: Once | INTRAVENOUS | Status: AC
  Administered 2018-12-04: 5 mg via INTRAVENOUS

## 2018-12-04 MED ORDER — MUPIROCIN 2 % EX OINT
TOPICAL_OINTMENT | Freq: Every day | CUTANEOUS | Status: DC
  Administered 2018-12-04: 1 via TOPICAL
  Administered 2018-12-05 – 2018-12-06 (×2): via TOPICAL
  Administered 2018-12-07: 1 via TOPICAL
  Filled 2018-12-04: qty 22

## 2018-12-04 MED ORDER — PHENYLEPHRINE HCL-NACL 10-0.9 MG/250ML-% IV SOLN
0.0000 ug/min | INTRAVENOUS | Status: DC
  Administered 2018-12-04: 09:00:00 15 ug/min via INTRAVENOUS
  Administered 2018-12-04: 10 ug/min via INTRAVENOUS
  Administered 2018-12-07 (×2): 20 ug/min via INTRAVENOUS
  Filled 2018-12-04 (×3): qty 250

## 2018-12-04 MED ORDER — DILTIAZEM HCL 25 MG/5ML IV SOLN
10.0000 mg | Freq: Once | INTRAVENOUS | Status: AC
  Administered 2018-12-04: 10 mg via INTRAVENOUS
  Filled 2018-12-04: qty 5

## 2018-12-04 MED ORDER — METOPROLOL TARTRATE 25 MG/10 ML ORAL SUSPENSION
12.5000 mg | Freq: Four times a day (QID) | ORAL | Status: DC
  Administered 2018-12-04 – 2018-12-06 (×7): 12.5 mg
  Filled 2018-12-04 (×7): qty 10

## 2018-12-04 MED ORDER — CALCIUM GLUCONATE-NACL 1-0.675 GM/50ML-% IV SOLN
1.0000 g | Freq: Once | INTRAVENOUS | Status: AC
  Administered 2018-12-04: 1000 mg via INTRAVENOUS
  Filled 2018-12-04: qty 50

## 2018-12-04 NOTE — Consult Note (Signed)
WOC Nurse Consult Note: Reason for Consult: Skin tear to right forearm.  Patient has a lot of clear, plastic tape to IV dressing sites and is not gentle enough for her thin fragile skin.  Wound type:Medical Adhesive related skin injury to right forearm. Resulting in a large skin tear with visible skin defect.  Edges not approximated.  Pressure Injury POA: NA Measurement: 3 cm x 9 cm x 0.2 cm  Wound [id number] pink Drainage (amount, consistency, odor) MOderate serosnaguinous  Oozing bloody effluent.  Periwound: Multiple bruises.  Thin skin Dressing procedure/placement/frequency: DO NOT USE CLEAR PLASTIC TAPE ON PATIENT.  Cleanse right forearm skin tear with NS and pat dry.  Apply silicone contact layer (MEPITEL) to wound bed.  Top with mupirocin ointment twice daily.  Change silicone contact layer twice weekly.  Cover with kerlix and tape.  Will not follow at this time.  Please re-consult if needed.  Domenic Moras MSN, RN, FNP-BC CWON Wound, Ostomy, Continence Nurse Pager [phone]

## 2018-12-04 NOTE — Progress Notes (Signed)
ANTICOAGULATION CONSULT NOTE  Pharmacy Consult:  Heparin Indication: ACS   Patient Measurements: Height: 5\' 4"  (162.6 cm) Weight: 110 lb 0.2 oz (49.9 kg) IBW/kg (Calculated) : 54.7 Heparin Dosing Weight: 49.7 kg  Vital Signs: Temp: 98.3 F (36.8 C) (11/29 2327) Temp Source: Oral (11/29 2327) BP: 89/59 (11/30 0200) Pulse Rate: 80 (11/30 0300)  Labs: Recent Labs    12/01/18 0520 12/01/18 0810  12/01/18 1615  12/02/18 0339  12/03/18 0630 12/03/18 0951 12/03/18 1701 12/04/18 0158  HGB 12.2  --   --   --   --  8.9*  --  8.3*  --   --   --   HCT 36.0  --   --   --   --  27.9*  --  25.8*  --   --   --   PLT  --   --   --   --   --  145*  --  125*  --   --   --   HEPARINUNFRC  --   --    < >  --    < >  --    < > 0.42  --  0.73* 0.98*  CREATININE  --  5.15*  --  1.90*  --   --   --  3.59*  --   --   --   TROPONINIHS  --   --    < > 7,503*  --   --   --  12,203* 11,210*  --   --    < > = values in this interval not displayed.    Estimated Creatinine Clearance: 12.5 mL/min (A) (by C-G formula based on SCr of 3.59 mg/dL (H)).   Assessment: 63 yo female with ESRD on HD - due to failed kidney transplant. Pt is admitted due to altered mental status. Heparin infusion has been ordered due to elevated troponin, which is likely due to demand ischemia from cardiac arrest per Cards.  Heparin level supratherapeutic (0.98) on gtt at 1000 units/hr. Small amount of oozing from arm skin tear but no other bleeding.  Goal of Therapy:  Heparin level 0.3-0.7 units/ml Monitor platelets by anticoagulation protocol: Yes   Plan:  -Reduce heparin infusion to 800 units/hr -F/u 8 hr heparin level    Thank you for allowing pharmacy to be a part of this patient's care.  Sherlon Handing, PharmD, BCPS Please see amion for complete clinical pharmacist phone list 12/04/2018 3:25 AM

## 2018-12-04 NOTE — Progress Notes (Signed)
[facility] Progress Note  Subjective:   HD stopped early 2/2 recurrent tachycardia  AM labs pending  Now on PE gtt  Vitals:   12/04/18 0730 12/04/18 0800 12/04/18 0814 12/04/18 0855  BP: 126/67  129/62 (!) 150/80  Pulse: 90  91 (!) 161  Resp: '18  19 18  ' Temp:  98.7 F (37.1 C)    TempSrc:  Oral    SpO2: 100%  100% 100%  Weight:      Height:        Inpatient medications: . aspirin  81 mg Per Tube Daily  . B-complex with vitamin C  1 tablet Per Tube Daily  . chlorhexidine gluconate (MEDLINE KIT)  15 mL Mouth Rinse BID  . Chlorhexidine Gluconate Cloth  6 each Topical Daily  . Chlorhexidine Gluconate Cloth  6 each Topical Q0600  . cycloSPORINE  75 mg Per Tube BID  . diltiazem  10 mg Intravenous Once  . feeding supplement (PRO-STAT SUGAR FREE 64)  30 mL Per Tube Daily  . [START ON 12/05/2018] heparin  2,000 Units Dialysis Once in dialysis  . mouth rinse  15 mL Mouth Rinse 10 times per day  . midazolam  5 mg Intravenous Once  . pantoprazole (PROTONIX) IV  40 mg Intravenous Daily  . predniSONE  5 mg Per Tube Q breakfast  . sulfamethoxazole-trimethoprim  10 mL Per Tube Q M,W,F-2000  . valganciclovir  450 mg Oral Q M,W,F-2000   . phenylephrine    . sodium chloride Stopped (12/01/18 1744)  . calcium gluconate    . dexmedetomidine (PRECEDEX) IV infusion 0.3 mcg/kg/hr (12/04/18 0700)  . feeding supplement (VITAL 1.5 CAL) 40 mL/hr at 12/04/18 0700  . fentaNYL infusion INTRAVENOUS Stopped (12/03/18 0759)  . heparin 800 Units/hr (12/04/18 0700)  . magnesium sulfate bolus IVPB    . norepinephrine (LEVOPHED) Adult infusion Stopped (12/04/18 0348)  . phenylephrine (NEO-SYNEPHRINE) Adult infusion    . vasopressin (PITRESSIN) infusion - *FOR SHOCK*     sodium chloride, acetaminophen **OR** acetaminophen, albuterol, docusate, fentaNYL (SUBLIMAZE) injection, polyethylene glycol    Exam: Frail adult female on vent, eyes open and close, not following commands   No  jVD   Chest cta bilat no rales   Cor reg no mrg   Abd soft ntnd  No ascites   Ext RUE swelling, no LE edema   LUE AVF +bruit   NF, awake but very sleepy    Dialysis: [facility] MWF   4h  400/800  2/2 bath  P4  42.5kg  Hep 2000  LUE AVF  M- 225 q2wk, last 11/24  Recent labs: Hb 10.3, ca 10.5, P 4.9     Cxr 11/27 - no edema  Assessment/ Plan: 1. S/p asystolic cardiac arrest / bradycardia: Required 11 minutes of CPR on 11/19/2018. Initially bradycardic, externally paced and started on bicarb/epi/heparin. Got HD 11/27 post-arrest in ICU w/ no fluid off and tolerated well.  CTA neg for PE.  Seen by cardiology. On empiric antibiotics, blood cx's NGTD.  2. AMS:  Unclear etiology. Patient is post-arrest on 11/26. Compliant with dialysis and BUN 32 on admit.  3.  ESRD:  MWF HD. Had HD in ICU 11/27, HD today stopped 2/2 tachycardia.  If labs ok will attempt again tomorrow, if needs HD today will need HD cath and CRRT.    4.  Hypertension/volume: Beta blocker on hold due to bradycardia post arrest. Per cards. Bed weights suggest up on EDW, UF as able.  5.  Anemia: Hgb 9.1, CTM. Not due for ESA. 6.  Metabolic bone disease: Ca and P stable, CTM\ 7. Recent failed renal Transplant attempt: Sept/ Oct at [facility], primary nonfunction, is getting her CyA and prednisone here.    Rexene Agent  12/04/2018, 9:03 AM  Iron/TIBC/Ferritin/ %Sat    Component Value Date/Time   IRON 42 07/17/2014 2244   TIBC 290 07/17/2014 2244   FERRITIN 61 07/17/2014 2244   IRONPCTSAT 14 07/17/2014 2244   Recent Labs  Lab 12/01/18 0217  12/01/18 0810  12/02/18 1603 12/03/18 0630  NA  --    < > 134*   < >  --  135  K  --    < > 5.7*   < >  --  3.6  CL  --    < > 91*   < >  --  97*  CO2  --    < > 8*   < >  --  22  GLUCOSE  --    < > 117*   < >  --  152*  BUN  --    < > 44*   < >  --  44*  CREATININE  --    < > 5.15*   < >  --  3.59*  CALCIUM  --    < > 8.8*   < >  --  8.2*  PHOS  --   --   --    < > 4.2  --   ALBUMIN  --     < > 2.4*  --   --   --   INR 1.8*  --   --   --   --   --    < > = values in this interval not displayed.   Recent Labs  Lab 12/01/18 0810  AST 2,761*  ALT 1,386*  ALKPHOS 83  BILITOT 2.4*  PROT 4.9*   Recent Labs  Lab 12/04/18 0420  WBC 7.6  HGB 9.1*  HCT 28.9*  PLT 118*

## 2018-12-04 NOTE — Progress Notes (Signed)
PT Cancellation Note  Patient Details Name: Erin Morgan MRN: [id number] DOB: 1956-01-04   Cancelled Treatment:    Reason Eval/Treat Not Completed: (P) Medical issues which prohibited therapy Pt has had further medical decline. PT will sign off for now. Please reorder when pt is medically appropriate. Thanks.   [place] 12/04/2018, 9:01 AM

## 2018-12-04 NOTE — Progress Notes (Signed)
Progress Note  Patient Name: Erin Morgan Date of Encounter: 12/04/2018  Primary Cardiologist: Mertie Moores, MD   Subjective   Intubated and sedated. Noted to have intermittent episodes of tachycardia. Telemetry reviewed. Very rapid onset, consistent with SVT/atach. Peak has been in the 170s on average, very briefly touched 200. Comes back to 100s on average. Changed from levophed to neosynephrine for BP control. Responded to metoprolol IV and diltiazem IV, lowest rates in the 60s after IV diltiazem. Unable to get HD today due to heart rate and hypotension.  Inpatient Medications    Scheduled Meds: . aspirin  81 mg Per Tube Daily  . B-complex with vitamin C  1 tablet Per Tube Daily  . chlorhexidine gluconate (MEDLINE KIT)  15 mL Mouth Rinse BID  . Chlorhexidine Gluconate Cloth  6 each Topical Daily  . Chlorhexidine Gluconate Cloth  6 each Topical Q0600  . cycloSPORINE  75 mg Per Tube BID  . feeding supplement (PRO-STAT SUGAR FREE 64)  30 mL Per Tube Daily  . [START ON 12/05/2018] heparin  2,000 Units Dialysis Once in dialysis  . hydrocortisone sod succinate (SOLU-CORTEF) inj  100 mg Intravenous Q8H  . mouth rinse  15 mL Mouth Rinse 10 times per day  . mupirocin ointment   Topical Daily  . pantoprazole (PROTONIX) IV  40 mg Intravenous Daily  . sulfamethoxazole-trimethoprim  10 mL Per Tube Q M,W,F-2000  . valganciclovir  450 mg Oral Q M,W,F-2000   Continuous Infusions: . sodium chloride Stopped (12/01/18 1744)  . dexmedetomidine (PRECEDEX) IV infusion 0.3 mcg/kg/hr (12/04/18 1400)  . feeding supplement (VITAL 1.5 CAL) 40 mL/hr at 12/04/18 0700  . phenylephrine (NEO-SYNEPHRINE) Adult infusion 30 mcg/min (12/04/18 1400)  . sodium phosphate  Dextrose 5% IVPB 43 mL/hr at 12/04/18 1400   PRN Meds: sodium chloride, acetaminophen **OR** acetaminophen, albuterol, docusate, fentaNYL (SUBLIMAZE) injection, polyethylene glycol   Vital Signs    Vitals:   12/04/18 1201 12/04/18  1213 12/04/18 1300 12/04/18 1400  BP: 124/63   105/67  Pulse: 85  (!) 53 (!) 113  Resp: '18  18 17  ' Temp:  97.7 F (36.5 C)    TempSrc:  Oral    SpO2: 100%  100% 100%  Weight:      Height:        Intake/Output Summary (Last 24 hours) at 12/04/2018 1435 Last data filed at 12/04/2018 1400 Gross per 24 hour  Intake 1339.21 ml  Output 354 ml  Net 985.21 ml   Last 3 Weights 12/04/2018 12/04/2018 12/04/2018  Weight (lbs) 110 lb 10.7 oz 110 lb 10.7 oz 110 lb 10.7 oz  Weight (kg) 50.2 kg 50.2 kg 50.2 kg  Some encounter information is confidential and restricted. Go to Review Flowsheets activity to see all data.      Telemetry    Largely sinus, but frequent rapid onset/offest atrial arrhythmias. Majority of these march, consistent with SVT/atach, though some brief spells are difficult to assess in terms of rhythm. - Personally Reviewed      ECG    Sinus with 1st degree AV block - Personally Reviewed  Physical Exam   GEN: Intubated and sedated Neck: No JVD visible Cardiac: largely regular, though intermittent episodes of rapid heart rhythm, no murmurs, rubs, or gallops.  Respiratory: Clear to auscultation bilaterally anteriorly/laterally GI: Soft, nontender, non-distended  MS: No edema; No deformity. Neuro:  sedated Psych: sedated  Labs    High Sensitivity Troponin:   Recent Labs  Lab 12/01/18 0106  12/01/18 0945 12/01/18 1615 12/03/18 0630 12/03/18 0951  TROPONINIHS 1,936* 4,355* 7,503* 12,203* 11,210*      Chemistry Recent Labs  Lab 12/01/18 0106  12/01/18 0304  12/01/18 0810 12/01/18 1615 12/03/18 0630 12/04/18 0833  NA  --    < > 132*   < > 134* 136 135 135  K  --    < > 5.5*   < > 5.7* 3.7 3.6 3.7  CL  --    < > 90*  --  91* 96* 97* 99  CO2  --    < > 11*  --  8* '23 22 22  ' GLUCOSE  --    < > 179*  --  117* 71 152* 137*  BUN  --    < > 42*  --  44* 9 44* 34*  CREATININE  --    < > 4.95*  --  5.15* 1.90* 3.59* 2.50*  CALCIUM  --    < > 8.5*  --  8.8*  8.0* 8.2* 8.0*  PROT 5.2*  --  4.6*  --  4.9*  --   --   --   ALBUMIN 2.5*  --  2.4*  --  2.4*  --   --  2.4*  AST 985*  --  1,664*  --  2,761*  --   --   --   ALT 591*  --  962*  --  1,386*  --   --   --   ALKPHOS 90  --  73  --  83  --   --   --   BILITOT 1.1  --  1.5*  --  2.4*  --   --   --   GFRNONAA  --    < > 9*  --  8* 27* 13* 20*  GFRAA  --    < > 10*  --  10* 32* 15* 23*  ANIONGAP  --    < > 31*  --  35* 17* 16* 14   < > = values in this interval not displayed.     Hematology Recent Labs  Lab 12/02/18 0339 12/03/18 0630 12/04/18 0420  WBC 12.4* 8.7 7.6  RBC 2.83* 2.62* 2.90*  HGB 8.9* 8.3* 9.1*  HCT 27.9* 25.8* 28.9*  MCV 98.6 98.5 99.7  MCH 31.4 31.7 31.4  MCHC 31.9 32.2 31.5  RDW 19.2* 19.6* 20.0*  PLT 145* 125* 118*    BNPNo results for input(s): BNP, PROBNP in the last 168 hours.   DDimer  Recent Labs  Lab 12/01/18 1230  DDIMER >20.00*     Radiology    No results found.  Cardiac Studies   Echo 12/01/18   1. Left ventricular ejection fraction, by visual estimation, is 45 to 50%. The left ventricle has mildly decreased function. There is moderately increased left ventricular hypertrophy.  2. Left ventricular diastolic parameters are consistent with Grade III diastolic dysfunction (restrictive).  3. Global right ventricle has normal systolic function.The right ventricular size is normal.  4. Left atrial size was moderately dilated.  5. Right atrial size was normal.  6. Moderate mitral annular calcification.  7. The mitral valve is normal in structure. Trace mitral valve regurgitation. No evidence of mitral stenosis.  8. The tricuspid valve is normal in structure. Tricuspid valve regurgitation is mild.  9. The aortic valve is tricuspid. Aortic valve regurgitation is not visualized. Mild aortic valve stenosis. 10. The pulmonic valve was normal in structure. Pulmonic valve regurgitation  is trivial. 11. Mildly elevated pulmonary artery systolic  pressure. 12. The inferior vena cava is dilated in size with >50% respiratory variability, suggesting right atrial pressure of 8 mmHg. 13. Mild global reduction in LV systolic function; restrictive filling; moderate LVH; myocardium with speckled appearance (consider amyloid); mild AS (mean gradient 14 mmHg); moderate LAE; mild TR.  Patient Profile     63 y.o. female with complex course, presented with altered mental status, course complicated by hypoxic cardiac arrest. Has history of ESRD on HD, rheumatoid arthritis, ectopic atrial tachycardia.  Assessment & Plan    Atrial tachycardia: has been more frequent today. See Dr. Jackalyn Lombard note 12/01/18.  -was on 25 mg metoprolol tartrate BID at home. Has responded to IV metoprolol and IV diltiazem, but only briefly. Difficult situation. She is on some blood pressure support with neosynephrine, but need rate control as well. Will trial low dose metoprolol tartrate per tube and see if this controls HR. This may also allow BP to rise/perfuse better as well. -no significant bradycardia since cardiac arrest (felt 2/2 respiratory), but monitor closely. If bradycardia returns, need to discuss temp perm or other pacing for tachy-brady syndrome -with acute liver injury, would avoid amiodarone. Digoxin unlikely to work without some beta blocker on board.  Elevated troponin: normal coronaries earlier this year. Complicated in the setting of cardiac arrest/CPR. Peak [id number], now downtrending -mildly depressed EF on recent echo, restrictive diastolic dysfunction -suspect demand ischemia given cardiac arrest/critical illness, but cannot exclude ACS. However, low suspicion for this, no STEMI on ECG, prior normal coronaries. No acute indication for cath. -ok to continue aspirin for now -off heparin drip -limited data for statins for prevention in ESRD, but tolerated atorvastatin 10 mg 3x/week as an outpatient. With acute liver injury from cardiac arrest, would not  restart until liver enzymes normalize  CRITICAL CARE Patient is critically ill with multiple organ systems affected and requires high complexity decision making. Total critical care time: 35 minutes. This time includes gathering of history, evaluation of patient's response to treatment, examination of patient, review of laboratory and imaging studies, and coordination with consultants.   For questions or updates, please contact [place] Please consult [email or web address] for contact info under     Signed, Buford Dresser, MD  12/04/2018, 2:35 PM

## 2018-12-04 NOTE — Progress Notes (Addendum)
NAME:  Erin Morgan, MRN:  [id number], DOB:  1955/02/03, LOS: 4 ADMISSION DATE:  11/09/2018, CONSULTATION DATE:  12/01/2018 REFERRING MD:  Dr. Doristine Bosworth CHIEF COMPLAINT:  Cardiac arrest  Brief History   This is a 63 year old woman with history of end-stage renal disease, with a recent admission for symptomatic anemia requiring transfusion.  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  She subsequently developed graft failure and is now back on dialysis.  She was having weakness and lethargy at home, and did complete her full run of dialysis on Tuesday.  She was admitted on 11/26 for weakness and lethargy.  Shortly after admission she developed cardiac arrest with asystole and received ACLS protocol for 11 minutes.  She was intubated and was subsequently bradycardic, for which she was placed on a dopamine infusion with minimal benefit.  She was then transcutaneously paced.  When neither of these work she was transitioned to an epinephrine drip.  She was admitted to the [facility] ICU and PCCM was consulted for further management.  Consults:  Nephrology  Procedures:   11/27 Intubation 11/27 arterial line 11/27 central line 11/27 ctpa: 1. There is no evidence for acute pulmonary embolus. 2. There is bibasilar airspace disease favored to represent a combination of atelectasis and consolidation. 3. Bilateral nondisplaced rib fractures likely related to cardiopulmonary resuscitation. There appears to be a fracture involving the inferior sternum that is poorly evaluated secondary to motion artifact at this level. No pneumothorax identified on this exam. 4. Bilateral pleural effusions, right greater than left. 5. Cardiomegaly. 6. Multinodular goiter with a dominant left-sided thyroid nodule measuring approximately 1.5 cm. Further evaluation with nonemergent outpatient thyroid ultrasound is recommended if not already  performed. 7. Small amount of free fluid in the upper abdomen. 11/27 cth: 1. No acute intracranial abnormality. 2. Stable parenchymal atrophy and chronic microvascular angiopathy. 11/26 MRI: 1. No evidence of acute intracranial abnormality. 2. Mild generalized parenchymal atrophy and chronic small vessel ischemic disease. 11/27 echo: LVEF 45-50% with mod LVH, Grade III diastolic dysfunction, mild AS, mod LAE, mild TR  Micro Data:  Blood cultures 11/26: ngtd 11/26: sars2: neg  Antimicrobials:  11/27 cefepime>> 11/26->11/27 11/27 vancomycin CHRONIC MEDS:  11/27 valganciclovir (MWF) 11/27 bactrim (MWF) 11/27 cyclosporine  Interim history/subjective:  11/30: eventful morning but no reports overnight. Somewhat hypotensive req low dose levo gtt. HR increase to 160's-200's. Originally resolved with 1x dose of fentanyl but reoccurred when dialysis started. Hypotensive req escalating pressors and tachy to 200's. Given 5mg  lopressor with resolution of HR and improvement of BP. Stopped levo and transitioned to neo. Pt remains unresponsive but spontaneously moving all 4.  11/29:Overnight no acute issues.  Still lethargic this morning after fentanyl was held.  Objective   Blood pressure 126/67, pulse 92, temperature 99.8 F (37.7 C), temperature source Oral, resp. rate 18, height 5\' 4"  (1.626 m), weight 50.2 kg, SpO2 100 %.    Vent Mode: PRVC FiO2 (%):  [30 %] 30 % Set Rate:  [18 bmp] 18 bmp Vt Set:  [430 mL] 430 mL PEEP:  [5 cmH20] 5 cmH20 Pressure Support:  [10 cmH20] 10 cmH20 Plateau Pressure:  [16 cmH20-17 cmH20] 17 cmH20   Intake/Output Summary (Last 24 hours) at 12/04/2018 0810 Last data filed at 12/04/2018 0700 Gross per 24 hour  Intake 1379.94 ml  Output 150 ml  Net 1229.94 ml   Filed Weights   12/02/18 0343 12/03/18 0500  12/04/18 0345  Weight: 49.7 kg 49.9 kg 50.2 kg    Examination: General: Intubated,sedated unresponsive.  HENT: NCAT, mmm, ett in place Lungs: No  increased wob, lungs are clear, no wheezes or crackles Cardiovascular: tachycardic, regular, no mrg Abdomen: scaphoid, soft Extremities: LUE AVF with palpable thrill and bruit.  Neuro: unresponsive, spontaneously moves all 4 extremities, opens eyes to verbal stim but not tracking.  MSK: no rashes or acute synovitis Lines: OG, left femoral A-line, right femoral CVC   Assessment & Plan:    Cardiac arrest Bradycardia, Asystole On 11/26 Transient complete heart block Type II NSTEMI -unclear etiology -appreciate cardiology recommendations H/O Atrial tachycardia:  -holding home dilt/bb at this time.  -tachy to 160 this am appears to be sinus tach -improved with fentanyl and then reoccurred. May need restart of low dose bb but now on low dose levo while on dialysis (18mcg) -will defer to cards at this time.  Shock:  -suspect 2/2 cardiac etiology -titrate pressors.  -changed from levo to neo 2/2 tachy up to 200hr -increased steroids to stress dosing.   Acute encephalopathy:  -s/p cardiac arrest -repeat MRI brain for hypoxic injury -eeg pending for after MRI -? If 2/2 need for effective dialysis.   Acute hypoxemic respiratory failure -titrate vent -sat/sbt as able -cxr in am.  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  -She is also still taking cyclosporine, and Valcyte.  Rheumatoid Arthritis on hydroxychloroquine -Prednisone 5 mg for chronic graft rejection and alloimmunization prevention.  -She is also still taking cyclosporine, and Valcyte.  Chronic normocytic anemia:  -likely 2/2 esrd -follow, no acute indication for transfusion Thrombocytopenia:  -slow downward trend -follow closely as on heparin gtt per cards  Best practice:  Diet: Continue tube feeds Pain/Anxiety/Delirium protocol (if indicated):  precedex, prn fentanyl VAP protocol (if indicated): per protocol DVT prophylaxis: heparin gtt GI prophylaxis: PPI Glucose control: Controlled, under 180 Foley anuric Mobility: bed rest Code Status: Full Code Family Communication: Mother updated 11/30, goals of care addressed, mother to bring in her living will paperwork.  Disposition:  ICU  Labs     CBC: Recent Labs  Lab 11/07/2018 0917  12/01/18 0144 12/01/18 0217 12/01/18 0245 12/01/18 0520 12/02/18 0339 12/03/18 0630 12/04/18 0420  WBC 13.4*  --  14.5* 23.9*  --   --  12.4* 8.7 7.6  NEUTROABS 10.5*  --   --   --   --   --   --   --   --   HGB 10.8*   < > 7.8* 10.7* 11.2* 12.2 8.9* 8.3* 9.1*  HCT 35.8*   < > 24.9* 33.6* 33.0* 36.0 27.9* 25.8* 28.9*  MCV 106.2*  --  103.8* 102.1*  --   --  98.6 98.5 99.7  PLT 337  --  182 272  --   --  145* 125* 118*   < > = values in this interval not displayed.    Basic Metabolic Panel: Recent Labs  Lab 12/01/18 0106  12/01/18 0144  12/01/18 0304 12/01/18 0520 12/01/18 0810 12/01/18 1230 12/01/18 1615 12/02/18 0339 12/02/18 1603 12/03/18 0630  NA  --    < > 138   < > 132* 126* 134*  --  136  --   --  135  K  --    < > 3.7   < > 5.5* 5.6*  5.7*  --  3.7  --   --  3.6  CL  --   --  112*  --  90*  --  91*  --  96*  --   --  97*  CO2  --   --  <7*  --  11*  --  8*  --  23  --   --  22  GLUCOSE  --   --  28*  --  179*  --  117*  --  71  --   --  152*  BUN  --   --  31*  --  42*  --  44*  --  9  --   --  44*  CREATININE  --   --  3.04*  --  4.95*  --  5.15*  --  1.90*  --   --  3.59*  CALCIUM  --   --  5.6*  --  8.5*  --  8.8*  --  8.0*  --   --  8.2*  MG 2.1  --   --   --   --   --   --  1.8 1.7 3.1* 3.1*  --   PHOS 8.3*  --   --   --   --   --   --  4.7* 3.7 3.9 4.2  --    < > = values in this interval not displayed.   GFR: Estimated Creatinine Clearance: 12.5 mL/min (A) (by C-G formula based on SCr of 3.59 mg/dL (H)). Recent Labs  Lab 12/01/18 0217 12/01/18 0800 12/01/18  1615 12/02/18 0339 12/03/18 0630 12/04/18 0420  WBC 23.9*  --   --  12.4* 8.7 7.6  LATICACIDVEN  --  >11.0* 4.0*  --   --   --     Liver Function Tests: Recent Labs  Lab 11/21/2018 0917 12/01/18 0106 12/01/18 0304 12/01/18 0810  AST 37 985* 1,664* 2,761*  ALT 44 591* 962* 1,386*  ALKPHOS 62 90 73 83  BILITOT 1.1 1.1 1.5* 2.4*  PROT 5.3* 5.2* 4.6* 4.9*  ALBUMIN 2.7* 2.5* 2.4* 2.4*   No results for input(s): LIPASE, AMYLASE in the last 168 hours. Recent Labs  Lab 12/01/18 0106  AMMONIA 140*    ABG    Component Value Date/Time   PHART 7.155 (LL) 12/01/2018 0520   PCO2ART 24.0 (L) 12/01/2018 0520   PO2ART 319.0 (H) 12/01/2018 0520   HCO3 8.5 (L) 12/01/2018 0520   TCO2 9 (L) 12/01/2018 0520   ACIDBASEDEF 19.0 (H) 12/01/2018 0520   O2SAT 100.0 12/01/2018 0520     Coagulation Profile: Recent Labs  Lab 11/13/2018 0917 12/01/18 0217  INR 1.0 1.8*    Cardiac Enzymes: No results for input(s): CKTOTAL, CKMB, CKMBINDEX, TROPONINI in the last 168 hours.  HbA1C: No results found for: HGBA1C  CBG: Recent Labs  Lab 12/03/18 1610 12/03/18 1939 12/03/18 2325 12/04/18 0340 12/04/18 0751  GLUCAP 147* 162* 153* 155* 125*    Imaging: Based on OSH reports: TTE showed mildly reduced left ventricular systolic function with LVEF 45%. Of note, LHC at OSH 05/2018 showed nonobstructive CAD per documentation    Critical care time:   The patient is critically ill with multiple organ systems failure and requires high complexity decision making for assessment and support, frequent evaluation and titration of therapies, application of advanced monitoring technologies and extensive interpretation of multiple databases.   Critical Care Time devoted to patient care services described in this note  is 51 minutes. This time reflects the time of my personal involvement. This critical care time does not reflect separately billable procedures or procedure time, teaching time or supervisory  time of PA/NP/Med student/Med Resident etc but could involve care discussion time.  [name] [facility] 12/04/2018 8:10 AM

## 2018-12-05 ENCOUNTER — Inpatient Hospital Stay (HOSPITAL_COMMUNITY): Payer: Medicare Other

## 2018-12-05 DIAGNOSIS — I420 Dilated cardiomyopathy: Secondary | ICD-10-CM | POA: Diagnosis not present

## 2018-12-05 DIAGNOSIS — G931 Anoxic brain damage, not elsewhere classified: Secondary | ICD-10-CM

## 2018-12-05 DIAGNOSIS — G9340 Encephalopathy, unspecified: Secondary | ICD-10-CM | POA: Diagnosis not present

## 2018-12-05 DIAGNOSIS — N186 End stage renal disease: Secondary | ICD-10-CM | POA: Diagnosis not present

## 2018-12-05 DIAGNOSIS — R000 Tachycardia, unspecified: Secondary | ICD-10-CM

## 2018-12-05 DIAGNOSIS — I469 Cardiac arrest, cause unspecified: Secondary | ICD-10-CM | POA: Diagnosis not present

## 2018-12-05 LAB — CULTURE, BLOOD (ROUTINE X 2): Culture: NO GROWTH

## 2018-12-05 LAB — HEPATIC FUNCTION PANEL
ALT: 607 U/L — ABNORMAL HIGH (ref 0–44)
AST: 118 U/L — ABNORMAL HIGH (ref 15–41)
Albumin: 1.8 g/dL — ABNORMAL LOW (ref 3.5–5.0)
Alkaline Phosphatase: 89 U/L (ref 38–126)
Bilirubin, Direct: 0.3 mg/dL — ABNORMAL HIGH (ref 0.0–0.2)
Indirect Bilirubin: 0.7 mg/dL (ref 0.3–0.9)
Total Bilirubin: 1 mg/dL (ref 0.3–1.2)
Total Protein: 4.3 g/dL — ABNORMAL LOW (ref 6.5–8.1)

## 2018-12-05 LAB — CBC
HCT: 28.7 % — ABNORMAL LOW (ref 36.0–46.0)
Hemoglobin: 9.2 g/dL — ABNORMAL LOW (ref 12.0–15.0)
MCH: 31.4 pg (ref 26.0–34.0)
MCHC: 32.1 g/dL (ref 30.0–36.0)
MCV: 98 fL (ref 80.0–100.0)
Platelets: 95 10*3/uL — ABNORMAL LOW (ref 150–400)
RBC: 2.93 MIL/uL — ABNORMAL LOW (ref 3.87–5.11)
RDW: 21.2 % — ABNORMAL HIGH (ref 11.5–15.5)
WBC: 6.2 10*3/uL (ref 4.0–10.5)
nRBC: 4.2 % — ABNORMAL HIGH (ref 0.0–0.2)

## 2018-12-05 LAB — GLUCOSE, CAPILLARY
Glucose-Capillary: 23 mg/dL — CL (ref 70–99)
Glucose-Capillary: 154 mg/dL — ABNORMAL HIGH (ref 70–99)
Glucose-Capillary: 130 mg/dL — ABNORMAL HIGH (ref 70–99)
Glucose-Capillary: 148 mg/dL — ABNORMAL HIGH (ref 70–99)
Glucose-Capillary: 202 mg/dL — ABNORMAL HIGH (ref 70–99)
Glucose-Capillary: 183 mg/dL — ABNORMAL HIGH (ref 70–99)

## 2018-12-05 LAB — PATHOLOGIST SMEAR REVIEW

## 2018-12-05 LAB — AMMONIA: Ammonia: 55 umol/L — ABNORMAL HIGH (ref 9–35)

## 2018-12-05 MED ORDER — LORAZEPAM 2 MG/ML IJ SOLN
2.0000 mg | Freq: Once | INTRAMUSCULAR | Status: AC
  Administered 2018-12-05: 11:00:00 2 mg via INTRAVENOUS

## 2018-12-05 MED ORDER — DOCUSATE SODIUM 50 MG/5ML PO LIQD: 100.0000 mg | Freq: Every day | ORAL | Status: DC | PRN

## 2018-12-05 MED ORDER — LEVETIRACETAM IN NACL 1000 MG/100ML IV SOLN
1000.0000 mg | INTRAVENOUS | Status: DC
  Filled 2018-12-05: qty 100

## 2018-12-05 MED ORDER — LACTULOSE 10 GM/15ML PO SOLN
10.0000 g | Freq: Every day | ORAL | Status: DC
  Administered 2018-12-05 – 2018-12-07 (×3): 10 g
  Filled 2018-12-05 (×3): qty 15

## 2018-12-05 MED ORDER — LORAZEPAM 2 MG/ML IJ SOLN
INTRAMUSCULAR | Status: AC
  Administered 2018-12-05: 2 mg via INTRAVENOUS
  Filled 2018-12-05: qty 1

## 2018-12-05 MED ORDER — ACETAMINOPHEN 650 MG RE SUPP: 650.0000 mg | Freq: Four times a day (QID) | RECTAL | Status: DC | PRN

## 2018-12-05 MED ORDER — POLYETHYLENE GLYCOL 3350 17 G PO PACK: 17.0000 g | PACK | Freq: Every day | ORAL | Status: DC | PRN

## 2018-12-05 MED ORDER — VALGANCICLOVIR HCL 50 MG/ML PO SOLR
450.0000 mg | ORAL | Status: DC
  Administered 2018-12-06: 450 mg
  Filled 2018-12-05: qty 9

## 2018-12-05 MED ORDER — CHLORHEXIDINE GLUCONATE 0.12 % MT SOLN
OROMUCOSAL | Status: AC
  Filled 2018-12-05: qty 15

## 2018-12-05 MED ORDER — ACETAMINOPHEN 325 MG PO TABS: 650.0000 mg | ORAL_TABLET | Freq: Four times a day (QID) | ORAL | Status: DC | PRN

## 2018-12-05 NOTE — Progress Notes (Signed)
SLP Cancellation Note  Patient Details Name: ALEXYS KEZIAH MRN: [id number] DOB: 1955-07-01   Cancelled treatment:       Reason Eval/Treat Not Completed: Medical issues which prohibited therapy. SLP to sign off for now. Please reorder if needed.    Venita Sheffield Rudra Hobbins 12/05/2018, 7:26 AM  Pollyann Glen, M.A. [place] Acute Environmental education officer [phone] Office [phone]

## 2018-12-05 NOTE — Progress Notes (Signed)
[facility] Progress Note  Subjective:   Low dose PE gtt  Tachy in 130s  Started low dose MTP  30% FIO2  Neurology following   Labs stable  Vitals:   12/05/18 1000 12/05/18 1111 12/05/18 1118 12/05/18 1200  BP: 97/72 97/72  99/61  Pulse: (!) 131   (!) 135  Resp: (!) 21   20  Temp:   99.7 F (37.6 C)   TempSrc:   Axillary   SpO2: 100% 100%  99%  Weight:      Height:        Inpatient medications: . aspirin  81 mg Per Tube Daily  . B-complex with vitamin C  1 tablet Per Tube Daily  . chlorhexidine gluconate (MEDLINE KIT)  15 mL Mouth Rinse BID  . Chlorhexidine Gluconate Cloth  6 each Topical Daily  . Chlorhexidine Gluconate Cloth  6 each Topical Q0600  . cycloSPORINE  75 mg Per Tube BID  . feeding supplement (PRO-STAT SUGAR FREE 64)  30 mL Per Tube Daily  . hydrocortisone sod succinate (SOLU-CORTEF) inj  100 mg Intravenous Q8H  . lactulose  10 g Per Tube Daily  . mouth rinse  15 mL Mouth Rinse 10 times per day  . metoprolol tartrate  12.5 mg Per Tube Q6H  . mupirocin ointment   Topical Daily  . pantoprazole (PROTONIX) IV  40 mg Intravenous Daily  . sulfamethoxazole-trimethoprim  10 mL Per Tube Q M,W,F-2000  . [START ON 12/06/2018] valganciclovir  450 mg Per Tube Q M,W,F-2000   . sodium chloride 10 mL/hr at 12/05/18 1200  . dexmedetomidine (PRECEDEX) IV infusion Stopped (12/05/18 1007)  . feeding supplement (VITAL 1.5 CAL) 40 mL/hr at 12/05/18 1000  . levETIRAcetam Stopped (12/05/18 1226)  . phenylephrine (NEO-SYNEPHRINE) Adult infusion 10 mcg/min (12/05/18 1200)   sodium chloride, acetaminophen **OR** acetaminophen, albuterol, docusate, fentaNYL (SUBLIMAZE) injection, metoprolol tartrate, polyethylene glycol    Exam: Intubated, not awake/alert Tachy, regular Coarse bs b/l LUE AV +B/T No LEE No rashes/lesion   Dialysis: [facility] MWF   4h  400/800  2/2 bath  P4  42.5kg  Hep 2000  LUE AVF  M- 225 q2wk, last 11/24  Recent labs: Hb 10.3, ca 10.5, P  4.9     Cxr 11/27 - no edema  Assessment/ Plan: 1. S/p asystolic cardiac arrest / bradycardia: Required 11 minutes of CPR on 11/29/2018. CTA neg for PE.  Seen by cardiology. On empiric antibiotics, blood cx's NGTD.  2. AMS:  Unclear etiology likley anoxic, neuro following. Patient is post-arrest on 11/26. Compliant with dialysis and BUN 32 on admit.  3.  ESRD:  MWF HD. Had HD in ICU 11/27, HD not tolerated 11/30, aborted.  Reattempt today, if hemodynamics not permissive will req CRRT or GOC with family 4.  Hypertension/volume: LImited UF today, onlow dose PE 5.  Anemia: Hgb 9.1, CTM. Not due for ESA. 6.  Metabolic bone disease: Ca and P stable, CTM\ 7. Recent failed renal Transplant attempt: Sept/ Oct at [facility], primary nonfunction, is getting her CyA and prednisone here.    Rexene Agent  12/05/2018, 2:08 PM  Iron/TIBC/Ferritin/ %Sat    Component Value Date/Time   IRON 42 07/17/2014 2244   TIBC 290 07/17/2014 2244   FERRITIN 61 07/17/2014 2244   IRONPCTSAT 14 07/17/2014 2244   Recent Labs  Lab 12/01/18 0217  12/04/18 0833 12/05/18 1136  NA  --    < > 135  --   K  --    < >  3.7  --   CL  --    < > 99  --   CO2  --    < > 22  --   GLUCOSE  --    < > 137*  --   BUN  --    < > 34*  --   CREATININE  --    < > 2.50*  --   CALCIUM  --    < > 8.0*  --   PHOS  --    < > 1.2*  --   ALBUMIN  --    < > 2.4* 1.8*  INR 1.8*  --   --   --    < > = values in this interval not displayed.   Recent Labs  Lab 12/05/18 1136  AST 118*  ALT 607*  ALKPHOS 89  BILITOT 1.0  PROT 4.3*   Recent Labs  Lab 12/05/18 0439  WBC 6.2  HGB 9.2*  HCT 28.7*  PLT 95*

## 2018-12-05 NOTE — Progress Notes (Addendum)
Progress Note  Patient Name: Erin Morgan Date of Encounter: 12/05/2018  Primary Cardiologist: Mertie Moores, MD   Subjective   Remains intubated and sedated. Telemetry reviewed, as below. Being set up for EEG.  Inpatient Medications    Scheduled Meds: . aspirin  81 mg Per Tube Daily  . B-complex with vitamin C  1 tablet Per Tube Daily  . chlorhexidine gluconate (MEDLINE KIT)  15 mL Mouth Rinse BID  . Chlorhexidine Gluconate Cloth  6 each Topical Daily  . Chlorhexidine Gluconate Cloth  6 each Topical Q0600  . cycloSPORINE  75 mg Per Tube BID  . feeding supplement (PRO-STAT SUGAR FREE 64)  30 mL Per Tube Daily  . hydrocortisone sod succinate (SOLU-CORTEF) inj  100 mg Intravenous Q8H  . lactulose  10 g Per Tube Daily  . mouth rinse  15 mL Mouth Rinse 10 times per day  . metoprolol tartrate  12.5 mg Per Tube Q6H  . mupirocin ointment   Topical Daily  . pantoprazole (PROTONIX) IV  40 mg Intravenous Daily  . sulfamethoxazole-trimethoprim  10 mL Per Tube Q M,W,F-2000  . [START ON 12/06/2018] valganciclovir  450 mg Per Tube Q M,W,F-2000   Continuous Infusions: . sodium chloride 10 mL/hr at 12/05/18 1200  . dexmedetomidine (PRECEDEX) IV infusion Stopped (12/05/18 1007)  . feeding supplement (VITAL 1.5 CAL) 40 mL/hr at 12/05/18 1000  . phenylephrine (NEO-SYNEPHRINE) Adult infusion 10 mcg/min (12/05/18 1200)   PRN Meds: sodium chloride, acetaminophen **OR** acetaminophen, albuterol, docusate, fentaNYL (SUBLIMAZE) injection, metoprolol tartrate, polyethylene glycol   Vital Signs    Vitals:   12/05/18 1111 12/05/18 1118 12/05/18 1200 12/05/18 1532  BP: 97/72  99/61 (!) 144/78  Pulse:   (!) 135 (!) 141  Resp:   20 20  Temp:  99.7 F (37.6 C)    TempSrc:  Axillary    SpO2: 100%  99% 100%  Weight:      Height:        Intake/Output Summary (Last 24 hours) at 12/05/2018 1714 Last data filed at 12/05/2018 1200 Gross per 24 hour  Intake 1420.02 ml  Output 200 ml  Net  1220.02 ml   Last 3 Weights 12/05/2018 12/04/2018 12/04/2018  Weight (lbs) 115 lb 4.8 oz 110 lb 10.7 oz 110 lb 10.7 oz  Weight (kg) 52.3 kg 50.2 kg 50.2 kg  Some encounter information is confidential and restricted. Go to Review Flowsheets activity to see all data.      Telemetry    No further spikes of HR like day prior, but consistent in rate of 130s, regular, appears to be sinus tach with 1st degree AV block - Personally Reviewed   ECG    Sinus with 1st degree AV block - Personally Reviewed  Physical Exam   GEN: intubated and sedated HEENT: Normal, moist mucous membranes NECK: No JVD visible CARDIAC: regular rhythm, tachycardic, normal S1 and S2, no rubs or gallops. No murmur. VASCULAR: Radial and DP pulses 2+ bilaterally. No carotid bruits RESPIRATORY:  Clear to auscultation without rales, wheezing or rhonchi in anterior/lateral fields ABDOMEN: Soft, non-tender, non-distended MUSCULOSKELETAL:  Minimal response to stimuli SKIN: Warm and dry, no edema NEUROLOGIC:  sedated PSYCHIATRIC:  sedated  Labs    High Sensitivity Troponin:   Recent Labs  Lab 12/01/18 0106 12/01/18 0945 12/01/18 1615 12/03/18 0630 12/03/18 0951  TROPONINIHS 1,936* 4,355* 7,503* 12,203* 11,210*      Chemistry Recent Labs  Lab 12/01/18 0304  12/01/18 0810 12/01/18 1615 12/03/18 0630  12/04/18 0833 12/05/18 1136  NA 132*   < > 134* 136 135 135  --   K 5.5*   < > 5.7* 3.7 3.6 3.7  --   CL 90*  --  91* 96* 97* 99  --   CO2 11*  --  8* '23 22 22  ' --   GLUCOSE 179*  --  117* 71 152* 137*  --   BUN 42*  --  44* 9 44* 34*  --   CREATININE 4.95*  --  5.15* 1.90* 3.59* 2.50*  --   CALCIUM 8.5*  --  8.8* 8.0* 8.2* 8.0*  --   PROT 4.6*  --  4.9*  --   --   --  4.3*  ALBUMIN 2.4*  --  2.4*  --   --  2.4* 1.8*  AST 1,664*  --  2,761*  --   --   --  118*  ALT 962*  --  1,386*  --   --   --  607*  ALKPHOS 73  --  83  --   --   --  89  BILITOT 1.5*  --  2.4*  --   --   --  1.0  GFRNONAA 9*  --  8*  27* 13* 20*  --   GFRAA 10*  --  10* 32* 15* 23*  --   ANIONGAP 31*  --  35* 17* 16* 14  --    < > = values in this interval not displayed.     Hematology Recent Labs  Lab 12/03/18 0630 12/04/18 0420 12/05/18 0439  WBC 8.7 7.6 6.2  RBC 2.62* 2.90* 2.93*  HGB 8.3* 9.1* 9.2*  HCT 25.8* 28.9* 28.7*  MCV 98.5 99.7 98.0  MCH 31.7 31.4 31.4  MCHC 32.2 31.5 32.1  RDW 19.6* 20.0* 21.2*  PLT 125* 118* 95*    BNPNo results for input(s): BNP, PROBNP in the last 168 hours.   DDimer  Recent Labs  Lab 12/01/18 1230  DDIMER >20.00*     Radiology    Mr Brain Wo Contrast  Result Date: 12/05/2018 CLINICAL DATA:  Cardiac arrest with anoxic injury. Prolonged altered mental status. EXAM: MRI HEAD WITHOUT CONTRAST TECHNIQUE: Multiplanar, multiecho pulse sequences of the brain and surrounding structures were obtained without intravenous contrast. COMPARISON:  MRI head 12/02/2018 FINDINGS: Brain: Ventricular enlargement and atrophy, unchanged from the prior study. Negative for acute infarct. Minimal chronic white matter changes stable. Negative for hemorrhage or mass. No edema or midline shift. Vascular: Normal arterial flow voids. Skull and upper cervical spine: No focal bony abnormality Sinuses/Orbits: Air-fluid levels in the maxillary sinus bilaterally are new since the prior study. Mild mucosal edema elsewhere in the paranasal sinuses. Bilateral mastoid effusions are new. No orbital lesion. Other: None IMPRESSION: No acute abnormality.  Atrophy and mild chronic white matter changes Interval development of air-fluid levels in both maxillary sinuses. Interval development of bilateral mastoid effusion and mucosal edema throughout the paranasal sinuses. Electronically Signed   By: Franchot Gallo M.D.   On: 12/05/2018 16:39    Cardiac Studies   Echo 12/01/18   1. Left ventricular ejection fraction, by visual estimation, is 45 to 50%. The left ventricle has mildly decreased function. There is  moderately increased left ventricular hypertrophy.  2. Left ventricular diastolic parameters are consistent with Grade III diastolic dysfunction (restrictive).  3. Global right ventricle has normal systolic function.The right ventricular size is normal.  4. Left atrial size was moderately  dilated.  5. Right atrial size was normal.  6. Moderate mitral annular calcification.  7. The mitral valve is normal in structure. Trace mitral valve regurgitation. No evidence of mitral stenosis.  8. The tricuspid valve is normal in structure. Tricuspid valve regurgitation is mild.  9. The aortic valve is tricuspid. Aortic valve regurgitation is not visualized. Mild aortic valve stenosis. 10. The pulmonic valve was normal in structure. Pulmonic valve regurgitation is trivial. 11. Mildly elevated pulmonary artery systolic pressure. 12. The inferior vena cava is dilated in size with >50% respiratory variability, suggesting right atrial pressure of 8 mmHg. 13. Mild global reduction in LV systolic function; restrictive filling; moderate LVH; myocardium with speckled appearance (consider amyloid); mild AS (mean gradient 14 mmHg); moderate LAE; mild TR.  Patient Profile     63 y.o. female with complex course, presented with altered mental status, course complicated by hypoxic cardiac arrest. Has history of ESRD on HD, rheumatoid arthritis, ectopic atrial tachycardia.  Assessment & Plan    Atrial tachycardia: now improved, though appears to be in stable sinus tach at about 130 bpm -tolerating low dose metoprolol q6h. This is controlling the atrial tach but allowing sinus tach, which is likely 2/2 her critical illness -no significant bradycardia since cardiac arrest (felt 2/2 respiratory), but monitor closely. If bradycardia returns, need to discuss temp perm or other pacing for tachy-brady syndrome -with acute liver injury, would avoid amiodarone.   Elevated troponin: normal coronaries earlier this year.  Complicated in the setting of cardiac arrest/CPR. Peak [id number], now downtrending. Unlikely to be ACS. -mildly depressed EF on recent echo, restrictive diastolic dysfunction -suspect demand ischemia given cardiac arrest/critical illness, but cannot exclude ACS. However, low suspicion for this, no STEMI on ECG, prior normal coronaries. No acute indication for cath. -ok to continue aspirin for now -off heparin drip -limited data for statins for prevention in ESRD, but tolerated atorvastatin 10 mg 3x/week as an outpatient. With acute liver injury from cardiac arrest, would not restart until liver enzymes normalize  I spoke with her family at bedside and answered questions to the best of my ability. There does not seem to be a cardiac etiology to her arrest, and her atrial tachycardia is now controlled. The sinus tachycardia is likely driven by underlying illness. We will continue to follow tomorrow to make sure rhythm remains stable, but there is nothing else to offer from a cardiac standpoint.  CRITICAL CARE Patient is critically ill with multiple organ systems affected and requires high complexity decision making. Total critical care time: 30 minutes. This time includes gathering of history, evaluation of patient's response to treatment, examination of patient, review of laboratory and imaging studies, and coordination with consultants.   For questions or updates, please contact [place] Please consult [email or web address] for contact info under     Signed, Buford Dresser, MD  12/05/2018, 5:14 PM

## 2018-12-05 NOTE — Progress Notes (Signed)
Late entry:1752  Informed Myrle Sheng, Rn  HD Tx rescheduled for 12/06/18 per Dr. Joelyn Oms.

## 2018-12-05 NOTE — Progress Notes (Signed)
Patient was transported to MRI & back to 3M05 without any complications.

## 2018-12-05 NOTE — Consult Note (Addendum)
Neurology Consultation  Reason for Consult: Prolonged altered mental status Referring Physician: Elsworth Soho   History is obtained from: Chart  HPI: Erin Morgan is a 63 y.o. female with history of thoracic aortic aneurysm, rheumatoid arthritis, hypertension, history of blood transfusion, history of end-stage renal disease on dialysis Tuesday Thursday, Saturday- status post failed renal transplant, demand ischemia.  On 12/01/2018 patient was brought to the emergency room secondary to generalized weakness and fatigue for 1 week which is then followed by confusion which was lower than baseline. Shortly after admission on 12/01/2018 at 2:15 AM patient developed cardiac arrest with asystole and received ACLS protocol for 11 minutes.  She subsequently had ROSC with bradycardia and was placed on dopamine infusion.  Post intubation she was placed on Precedex.  Patient has been on fentanyl in addition which was DC'd at 1159 on 12/04/2018.  Precedex was turned off at 10 AM this morning, patient at that time was noted to have rhythmic vertical low amplitude head jerking.  Due to patient's mental status not improving neurology was consulted to evaluate patient.   Status post cardiac arrest patient's labs: Creatinine 2.5 BUN 34 Ammonia 140 AST 985-12/01/2018   on admission patient's AST was 37  ALT 591-12/01/2018    on admission patient's ALT was 44 Thrombocytopenia   ED course  -CT head 11/18/2018 prior to cardiac arrest -MRI brain 12/02/2018 prior to cardiac arrest -Labs  Chart review (patient has been seen by Dr. Krista Blue back on 08/15/2018 for follow-up on possible TIA.  At that time his symptoms were sudden onset language difficulty-expressive aphasia that lasted for about 5 minutes and quickly improved.  Patient was started on aspirin at that time.     Past Medical History:  Diagnosis Date  . Anemia   . Constipation   . Demand ischemia ([place])    a. 05/2018 elevated troponin in the setting of atrial  tachycardia.  Catheterization: Normal coronaries.  . Ectopic atrial tachycardia ([place])    a. 05/2018-managed with diltiazem.  Marland Kitchen ESRD (end stage renal disease) ([place])    dialysis - t/th/sa- [facility]  . Family history of adverse reaction to anesthesia    mom and sister has n/v  . GERD (gastroesophageal reflux disease)   . History of blood transfusion   . Hypertension   . Muscle weakness   . Osteopenia   . PONV (postoperative nausea and vomiting)    Headache  . Rheumatoid arthritis ([place])   . Seasonal allergies   . Thoracic aortic aneurysm ([place]) 07/18/2017   4.0 cm ascending thoracic noted on CT 07/18/2017  . Wears glasses     Family History  Problem Relation Age of Onset  . Hypertension Mother   . Cervical cancer Mother   . Cancer Mother   . Hyperlipidemia Mother   . Hypertension Father   . Stroke Father   . Hyperlipidemia Father   . CAD Father        at age 3  . Valvular heart disease Father        TAVR at age 25  . Healthy Sister   . Healthy Brother   . Rheum arthritis Other     Social History:   reports that she has never smoked. She has never used smokeless tobacco. She reports that she does not drink alcohol or use drugs.  Medications  Current Facility-Administered Medications:  .  0.9 %  sodium chloride infusion, , Intravenous, PRN, Tally Due, MD, Last Rate: 10 mL/hr at 12/05/18  1000 .  acetaminophen (TYLENOL) tablet 650 mg, 650 mg, Per Tube, Q6H PRN **OR** acetaminophen (TYLENOL) suppository 650 mg, 650 mg, Rectal, Q6H PRN, [name], DO .  albuterol (PROVENTIL) (2.5 MG/3ML) 0.083% nebulizer solution 2.5 mg, 2.5 mg, Nebulization, Q2H PRN, Tally Due, MD .  aspirin chewable tablet 81 mg, 81 mg, Per Tube, Daily, Pahwani, Rinka R, MD, 81 mg at 12/05/18 0939 .  B-complex with vitamin C tablet 1 tablet, 1 tablet, Per Tube, Daily, Spero Geralds, MD, 1 tablet at 12/05/18 [phone] .  chlorhexidine gluconate (MEDLINE KIT) (PERIDEX) 0.12 %  solution 15 mL, 15 mL, Mouth Rinse, BID, Spero Geralds, MD, 15 mL at 12/05/18 6384 .  Chlorhexidine Gluconate Cloth 2 % PADS 6 each, 6 each, Topical, Daily, Pahwani, Rinka R, MD, 6 each at 12/04/18 1019 .  Chlorhexidine Gluconate Cloth 2 % PADS 6 each, 6 each, Topical, Q0600, Roney Jaffe, MD, 6 each at 12/05/18 0020 .  cycloSPORINE (NEORAL) 100 MG/ML microemulsion solution 75 mg, 75 mg, Per Tube, BID, Spero Geralds, MD, 75 mg at 12/05/18 0939 .  dexmedetomidine (PRECEDEX) 400 MCG/100ML (4 mcg/mL) infusion, 0.4-1.2 mcg/kg/hr, Intravenous, Titrated, Spero Geralds, MD, Last Rate: 3.74 mL/hr at 12/05/18 1000, 0.3 mcg/kg/hr at 12/05/18 1000 .  docusate (COLACE) 50 MG/5ML liquid 100 mg, 100 mg, Per Tube, Daily PRN, [name], DO .  feeding supplement (PRO-STAT SUGAR FREE 64) liquid 30 mL, 30 mL, Per Tube, Daily, Spero Geralds, MD, 30 mL at 12/05/18 0939 .  feeding supplement (VITAL 1.5 CAL) liquid 1,000 mL, 1,000 mL, Per Tube, Continuous, Spero Geralds, MD, Last Rate: 40 mL/hr at 12/05/18 1000 .  fentaNYL (SUBLIMAZE) injection 50 mcg, 50 mcg, Intravenous, Q1H PRN, Tally Due, MD, 50 mcg at 12/05/18 0459 .  hydrocortisone sodium succinate (SOLU-CORTEF) 100 MG injection 100 mg, 100 mg, Intravenous, Q8H, Marshall, Jessica, DO, 100 mg at 12/05/18 1126 .  lactulose (CHRONULAC) 10 GM/15ML solution 10 g, 10 g, Per Tube, Daily, Rigoberto Noel, MD, 10 g at 12/05/18 1126 .  levETIRAcetam (KEPPRA) IVPB 1000 mg/100 mL premix, 1,000 mg, Intravenous, STAT, Marliss Coots, PA-C .  MEDLINE mouth rinse, 15 mL, Mouth Rinse, 10 times per day, Spero Geralds, MD, 15 mL at 12/05/18 1124 .  metoprolol tartrate (LOPRESSOR) 25 mg/10 mL oral suspension 12.5 mg, 12.5 mg, Per Tube, Q6H, Buford Dresser, MD, 12.5 mg at 12/05/18 1124 .  metoprolol tartrate (LOPRESSOR) injection 2.5-5 mg, 2.5-5 mg, Intravenous, Q3H PRN, Bowser, Grace E, NP, 5 mg at 12/05/18 0351 .  mupirocin ointment (BACTROBAN) 2 %, ,  Topical, Daily, [name], DO .  pantoprazole (PROTONIX) injection 40 mg, 40 mg, Intravenous, Daily, Tally Due, MD, 40 mg at 12/05/18 0939 .  phenylephrine (NEOSYNEPHRINE) 10-0.9 MG/250ML-% infusion, 0-400 mcg/min, Intravenous, Titrated, Bowser, Grace E, NP, Last Rate: 7.5 mL/hr at 12/05/18 1000, 5 mcg/min at 12/05/18 1000 .  polyethylene glycol (MIRALAX / GLYCOLAX) packet 17 g, 17 g, Per Tube, Daily PRN, [name], DO .  sulfamethoxazole-trimethoprim (BACTRIM) 200-40 MG/5ML suspension 10 mL, 10 mL, Per Tube, Q M,W,F-2000, Spero Geralds, MD, 10 mL at 12/04/18 1948 .  [START ON 12/06/2018] valganciclovir (VALCYTE) 50 MG/ML oral solution 450 mg, 450 mg, Per Tube, Q M,W,F-2000, [name], DO   Exam: Current vital signs: BP 97/72   Pulse (!) 131   Temp 99.7 F (37.6 C) (Axillary)   Resp (!) 21   Ht '5\' 4"'  (1.626 m)   Wt 52.3 kg  SpO2 100%   BMI 19.79 kg/m  Vital signs in last 24 hours: Temp:  [97.7 F (36.5 C)-100.4 F (38 C)] 99.7 F (37.6 C) (12/01 1118) Pulse Rate:  [53-138] 131 (12/01 1000) Resp:  [17-24] 21 (12/01 1000) BP: (91-124)/(57-78) 97/72 (12/01 1111) SpO2:  [94 %-100 %] 100 % (12/01 1111) Arterial Line BP: (90-132)/(50-74) 114/59 (12/01 1000) FiO2 (%):  [30 %] 30 % (12/01 1111) Weight:  [52.3 kg] 52.3 kg (12/01 0400)  ROS:   Unable to obtain due to altered mental status.     Physical Exam   Constitutional: Cachectic Psych: Intubated and obtunded Eyes: No scleral injection HENT: No OP obstrucion Head: Normocephalic.  Cardiovascular: Normal rate and regular rhythm.  Respiratory: Intubated but breathing over the vent GI: Soft.  No distension. There is no tenderness.  Skin: Anasarca   Neuro: Mental Status: Patient does not respond to verbal stimuli.  Does not respond to deep sternal rub.  Does not follow commands.  No verbalizations noted.  Intubated breathing over the vent Cranial Nerves: II: patient does does respond to  confrontation on the left I could not get her to respond on the right III,IV,VI: doll's response present bilaterally, roving eye movements and disconjugate gaze. pupils right 2 mm, left 2 mm,and sluggishly reactive bilaterally V,VII: corneal reflex present bilaterally  VIII: patient does not respond to verbal stimuli IX,X: gag reflex present, XI: trapezius strength unable to test bilaterally XII: tongue strength unable to test Motor: Extremities flaccid throughout.  No spontaneous movement noted.  Triple reflex noted with noxious stimuli of the lower extremities Sensory: Does not respond to noxious stimuli in any extremity.  As noted above triple reflex to noxious stimuli in lower extremities Deep Tendon Reflexes:  Absent throughout. Plantars: upgoing bilaterally Cerebellar: Unable to perform  Labs I have reviewed labs in epic and the results pertinent to this consultation are:   CBC    Component Value Date/Time   WBC 6.2 12/05/2018 0439   RBC 2.93 (L) 12/05/2018 0439   HGB 9.2 (L) 12/05/2018 0439   HCT 28.7 (L) 12/05/2018 0439   HCT 12.5 (LL) 11/24/2018 1645   PLT 95 (L) 12/05/2018 0439   MCV 98.0 12/05/2018 0439   MCH 31.4 12/05/2018 0439   MCHC 32.1 12/05/2018 0439   RDW 21.2 (H) 12/05/2018 0439   LYMPHSABS 0.2 (L) 12/04/2018 0917   MONOABS 1.2 (H) 11/07/2018 0917   EOSABS 0.0 11/29/2018 0917   BASOSABS 0.0 11/06/2018 0917    CMP     Component Value Date/Time   NA 135 12/04/2018 0833   K 3.7 12/04/2018 0833   CL 99 12/04/2018 0833   CO2 22 12/04/2018 0833   GLUCOSE 137 (H) 12/04/2018 0833   BUN 34 (H) 12/04/2018 0833   CREATININE 2.50 (H) 12/04/2018 0833   CALCIUM 8.0 (L) 12/04/2018 0833   PROT 4.9 (L) 12/01/2018 0810   ALBUMIN 2.4 (L) 12/04/2018 0833   AST 2,761 (H) 12/01/2018 0810   ALT 1,386 (H) 12/01/2018 0810   ALKPHOS 83 12/01/2018 0810   BILITOT 2.4 (H) 12/01/2018 0810   GFRNONAA 20 (L) 12/04/2018 0833   GFRAA 23 (L) 12/04/2018 0833    Lipid  Panel     Component Value Date/Time   CHOL 235 (H) 05/27/2018 0418   TRIG 61 05/27/2018 0418   HDL 44 05/27/2018 0418   CHOLHDL 5.3 05/27/2018 0418   VLDL 12 05/27/2018 0418   LDLCALC 179 (H) 05/27/2018 0418     Imaging I  have reviewed the images obtained:  CT-scan of the brain-obtained on 11/05/2018-showed no acute abnormality  CT head on 12/01/2018-no acute intracranial abnormality-stable parenchymal atrophy and chronic microvascular angiopathy  MRI examination of the brain-obtained on 11/29/2018-no evidence of acute intracranial abnormality.  Generalized parenchymal atrophy and chronic small vessel ischemic disease  EEG obtained today on 12/05/2018-pending  Etta Quill PA-C [facility] [phone]  M-F  (9:00 am- 5:00 PM)  12/05/2018, 11:34 AM     Assessment:  -This is a 63 year old female who arrived to the hospital with generalized weakness for 1 week followed by confusion.  Subsequently developed asystole for 11 minutes.  Status post asystole and ROSC patient has remained obtunded and on Precedex.  Precedex was turned off today at 10 AM and it was noticed that patient remained obtunded.  Patient does have multiple metabolic abnormalities which includes ammonia of 140, elevated creatinine and is on hemodialysis, initial AST was 37 however status post cardiac arrest AST has jumped 985, initial ALT was 44 however after cardiac arrest ALT jumped to 591.  PCCM initially thought that this could be metabolic encephalopathy/anoxic brain injury.  However due to the fact that she was not waking up status post discontinuing Precedex neurology was consulted.   -My exam shows that patient does retain brain stem reflexes however does not respond to pain or spontaneous movements, in addition after Precedex was turned off patient had a low amplitude vertical jerking of head was noted which subsided with ativan - due to this concern for sz, EEG was done which did not show any  lateralized finding.  No evidence of status.    -Concern for anoxic brain injury due to cardiac arrest  Impression: -Concern for seizure activity-less likely -Anoxic brain injury -Metabolic encephalopathy -thrombocytopenia  Recommendations: -Due to concern of nonclinical status epilepticus I have ordered a stat EEG,-negative for any lateralizing signs.  No need for AEDs. - MRI brain to evaluate for anoxic brain injury after EEG removed -Continue to treat elevated ammonia and other metabolic abnormalities -Cardiology following any cardiac issues at this time. -PCCM following respiratory issues.  --This has been also discussed with Dr. Elsworth Soho  Attending addendum Patient seen and examined for concern for seizure activity Agree with history physical documented above In brief, 63 year old with thoracic aortic aneurysm, rheumatoid arthritis, hypertension, history of blood transfusion, history of end-stage renal disease status post failed transplant currently on hemodialysis, admitted for generalized weakness and then developed cardiac arrest requiring ACLS protocol for 11 minutes prior to return of central circulation.  Continues to be altered in spite of minimizing sedation-was on Precedex and had poor neurological exam. Today, concern for head jerking-concerning for seizures. Lab review-significantly deranged liver function tests, deranged kidney function test which is known. Had an MRI prior to cardiac arrest which was unremarkable Agree with the examination above that I have independently confirmed. Agree with the assessment and plan that I helped formulate and have made corrections to.   -- Amie Portland, MD [facility] Pager: [phone] If 7pm to 7am, please call on call as listed on AMION.   CRITICAL CARE ATTESTATION Performed by: Amie Portland, MD Total critical care time: 32 minutes Critical care time was exclusive of separately billable procedures and treating  other patients and/or supervising APPs/Residents/Students Critical care was necessary to treat or prevent imminent or life-threatening deterioration due to toxic metabolic encephalopathy, evaluate for anoxic brain injury. This patient is critically ill and at significant risk for neurological worsening and/or death and care requires  constant monitoring. Critical care was time spent personally by me on the following activities: development of treatment plan with patient and/or surrogate as well as nursing, discussions with consultants, evaluation of patient's response to treatment, examination of patient, obtaining history from patient or surrogate, ordering and performing treatments and interventions, ordering and review of laboratory studies, ordering and review of radiographic studies, pulse oximetry, re-evaluation of patient's condition, participation in multidisciplinary rounds and medical decision making of high complexity in the care of this patient.

## 2018-12-05 NOTE — Progress Notes (Signed)
EEG completed, results pending. 

## 2018-12-05 NOTE — Progress Notes (Signed)
NAME:  Erin Morgan, MRN:  [id number], DOB:  August 12, 1955, LOS: 5 ADMISSION DATE:  11/18/2018, CONSULTATION DATE:  12/01/2018 REFERRING MD:  Dr. Doristine Bosworth CHIEF COMPLAINT:  Cardiac arrest  Brief History   This is a 63 year old woman with history of end-stage renal disease, with a recent admission for symptomatic anemia requiring transfusion.  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  She subsequently developed graft failure and is now back on dialysis.  She was having weakness and lethargy at home, and did complete her full run of dialysis on Tuesday.  She was admitted on 11/26 for weakness and lethargy.  Shortly after admission she developed cardiac arrest with asystole and received ACLS protocol for 11 minutes.  She was intubated and was subsequently bradycardic, for which she was placed on a dopamine infusion with minimal benefit.  She was then transcutaneously paced.  When neither of these work she was transitioned to an epinephrine drip.  She was admitted to the [facility]M ICU and PCCM was consulted for further management.  Consults:  Nephrology  Procedures:   11/27 Intubation 11/27 arterial line 11/27 central line  Based on OSH reports: TTE showed mildly reduced left ventricular systolic function with LVEF 45%. Of note, LHC at OSH 05/2018 showed nonobstructive CAD per documentation  11/27 ctpa: 1. There is no evidence for acute pulmonary embolus. 2. There is bibasilar airspace disease favored to represent a combination of atelectasis and consolidation. 3. Bilateral nondisplaced rib fractures likely related to cardiopulmonary resuscitation. There appears to be a fracture involving the inferior sternum  4. Bilateral pleural effusions, right greater than left. 5. Cardiomegaly. 6. Multinodular goiter with a dominant left-sided thyroid nodule measuring approximately 1.5 cm.  7. Small amount of free fluid in the  upper abdomen. 11/27 cth: 1. No acute intracranial abnormality. 2. Stable parenchymal atrophy and chronic microvascular angiopathy. 11/26 MRI: 1. No evidence of acute intracranial abnormality. 2. Mild generalized parenchymal atrophy and chronic small vessel ischemic disease. 11/27 echo: LVEF 45-50% with mod LVH, Grade III diastolic dysfunction, mild AS, mod LAE, mild TR  Micro Data:  Blood cultures 11/26: ngtd 11/26: sars2: neg  Antimicrobials:  11/27 cefepime>> 11/26->11/27 11/27 vancomycin CHRONIC MEDS:  11/27 valganciclovir (MWF) 11/27 bactrim (MWF) 11/27 cyclosporine  Interim history/subjective:  11/30: eventful morning but no reports overnight. Somewhat hypotensive req low dose levo gtt. HR increase to 160's-200's. Originally resolved with 1x dose of fentanyl but reoccurred when dialysis started. Hypotensive req escalating pressors and tachy to 200's. Given 5mg  lopressor with resolution of HR and improvement of BP. Stopped levo and transitioned to neo. Pt remains unresponsive but spontaneously moving all 4.   12/1 low-grade febrile overnight.  Remains critically ill, very tachycardic 130s in spite of Lopressor and Cardizem , off neo gtt Objective   Blood pressure 97/72, pulse (!) 131, temperature 98.8 F (37.1 C), temperature source Axillary, resp. rate (!) 21, height 5\' 4"  (1.626 m), weight 52.3 kg, SpO2 100 %.    Vent Mode: PRVC FiO2 (%):  [30 %] 30 % Set Rate:  [18 bmp] 18 bmp Vt Set:  [430 mL] 430 mL PEEP:  [5 cmH20] 5 cmH20 Plateau Pressure:  [13 cmH20-17 cmH20] 16 cmH20   Intake/Output Summary (Last 24 hours) at 12/05/2018 1026 Last data filed at 12/05/2018 1000 Gross per 24 hour  Intake 2121.74 ml  Output 300 ml  Net 1821.74 ml   Filed Weights   12/04/18  0730 12/04/18 0855 12/05/18 0400  Weight: 50.2 kg 50.2 kg 52.3 kg    Examination: General: Intubated, unresponsive, acutely ill HENT: NCAT, mmm, ett in place, mild horizontal nystagmus Lungs: No increased  wob, lungs are clear, no wheezes or crackles Cardiovascular: Swan S2 tacky, regular, no mrg Abdomen: scaphoid, soft Extremities: LUE AVF with palpable thrill and bruit.  Neuro: unresponsive, does not follow commands, opens eyes spontaneously but not tracking, horizontal nystagmus MSK: no rashes or acute synovitis Lines: OG, left femoral A-line, right femoral CVC   Chest x-ray 11/27 personally reviewed no new infiltrates, basilar airspace disease noted on CT angio  Assessment & Plan:    Cardiac arrest Bradycardia, Asystole On 11/26 Transient complete heart block Type II NSTEMI -unclear etiology H/O Atrial tachycardia:  -Restarted Lopressor, Cardizem on hold -will defer titration to cards  Shock:  -suspect cardiogenic rather than septic -titrate neo if needed  Acute encephalopathy: -s/p cardiac arrest,  -repeat MRI brain for hypoxic injury -eeg stat -Obtain neuro input -Doubt hepatic encephalopathy but elevated ammonia noted, will empirically add lactulose 10 g, limited by diarrhea, Pete ammonia 12/2   Acute hypoxemic respiratory failure -Spontaneous breathing trials currently limited by tachycardia   End-stage renal disease secondary to atypical HUS -And is to attempt dialysis today 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  -She is also still taking cyclosporine, and Valcyte.  -Stress dose hydrocortisone 100 every 8   Chronic normocytic anemia:  -likely 2/2 esrd -follow, no acute indication for transfusion  Thrombocytopenia: -slow downward trend, has dropped more than 50% since admit -Send HIT T panel -May need heparin alternative  Best practice:  Diet: Continue tube feeds Pain/Anxiety/Delirium protocol (if indicated): precedex, prn fentanyl VAP protocol (if indicated): per protocol  DVT prophylaxis: heparin gtt GI prophylaxis: PPI Glucose control: Controlled, under 180 Foley anuric Mobility: bed rest Code Status: Full Code Family Communication: Mother updated 12/1, she has living well but unable to prognosticate whether encephalopathy is irreversible or not, will institute no CPR  at this point Disposition:  ICU   The patient is critically ill with multiple organ systems failure and requires high complexity decision making for assessment and support, frequent evaluation and titration of therapies, application of advanced monitoring technologies and extensive interpretation of multiple databases. Critical Care Time devoted to patient care services described in this note independent of APP/resident  time is 35 minutes.    Kara Mead MD. [facility]. [facility]  If no response to pager , please call [phone]     12/05/2018 10:26 AM

## 2018-12-05 NOTE — Procedures (Signed)
Patient Name: Erin Morgan  MRN: [id number]  Epilepsy Attending: Lora Havens  Referring Physician/Provider: Dr. Kara Mead Date: 12/05/2018 Duration: 25.17 minutes  Patient history: 63 year old female with altered mental status, prior cardiac arrest during this admission who had eye blinking and head movement concerning for seizures after stopping Precedex this morning.  EEG to evaluate for seizures.  Level of alertness: Comatose  AEDs during EEG study: Ativan administered 1 hour prior to EEG  Technical aspects: This EEG study was done with scalp electrodes positioned according to the 10-20 International system of electrode placement. Electrical activity was acquired at a sampling rate of 500Hz  and reviewed with a high frequency filter of 70Hz  and a low frequency filter of 1Hz . EEG data were recorded continuously and digitally stored.   Description: EEG showed continuous generalized 2 to 5 Hz theta-delta slowing.  EEG was reactive to tactile stimuli.  Hyperventilation and photic stimulation were not performed.  Abnormality -Continuous slow, generalized  IMPRESSION: This study is suggestive of severe diffuse encephalopathy, nonspecific to etiology but could be secondary to sedation, hypoxic/anoxic brain injury, toxic-metabolic causes.. No seizures or epileptiform discharges were seen throughout the recording.

## 2018-12-05 NOTE — Progress Notes (Addendum)
Dexmedetomodine was stopped around 1007 as RASS -4 and patient not waking up or following commands. Patient now exhibiting slight muscle twitching in face and neck and horizontal nystagmus. Dr. Elsworth Soho at bedside and evaluated patient. Orders received for STAT EEG. Order to obtain MRI after EEG is performed.  Myrle Sheng BSN RN 22M/82M MICU

## 2018-12-05 NOTE — Progress Notes (Signed)
OT Cancellation Note  Patient Details Name: Erin Morgan MRN: [id number] DOB: 23-Jun-1955   Cancelled Treatment:    Reason Eval/Treat Not Completed: Medical issues which prohibited therapy.  OT will sign off at this time.  Please reorder when medically appropriate.  Lucille Passy, OTR/[facility] Pager [phone] Office [phone]   Lucille Passy M 12/05/2018, 7:09 AM

## 2018-12-05 DEATH — deceased

## 2018-12-06 ENCOUNTER — Inpatient Hospital Stay (HOSPITAL_COMMUNITY): Payer: Medicare Other

## 2018-12-06 DIAGNOSIS — R4182 Altered mental status, unspecified: Secondary | ICD-10-CM | POA: Diagnosis not present

## 2018-12-06 DIAGNOSIS — G9340 Encephalopathy, unspecified: Secondary | ICD-10-CM | POA: Diagnosis not present

## 2018-12-06 DIAGNOSIS — R0603 Acute respiratory distress: Secondary | ICD-10-CM | POA: Diagnosis not present

## 2018-12-06 LAB — CULTURE, BLOOD (ROUTINE X 2): Culture: NO GROWTH

## 2018-12-06 LAB — CBC
HCT: 30.5 % — ABNORMAL LOW (ref 36.0–46.0)
Hemoglobin: 9.8 g/dL — ABNORMAL LOW (ref 12.0–15.0)
MCH: 31.7 pg (ref 26.0–34.0)
MCHC: 32.1 g/dL (ref 30.0–36.0)
MCV: 98.7 fL (ref 80.0–100.0)
Platelets: 85 10*3/uL — ABNORMAL LOW (ref 150–400)
RBC: 3.09 MIL/uL — ABNORMAL LOW (ref 3.87–5.11)
RDW: 21.8 % — ABNORMAL HIGH (ref 11.5–15.5)
WBC: 4.1 10*3/uL (ref 4.0–10.5)
nRBC: 9.9 % — ABNORMAL HIGH (ref 0.0–0.2)

## 2018-12-06 LAB — GLUCOSE, CAPILLARY
Glucose-Capillary: 152 mg/dL — ABNORMAL HIGH (ref 70–99)
Glucose-Capillary: 169 mg/dL — ABNORMAL HIGH (ref 70–99)
Glucose-Capillary: 181 mg/dL — ABNORMAL HIGH (ref 70–99)
Glucose-Capillary: 193 mg/dL — ABNORMAL HIGH (ref 70–99)
Glucose-Capillary: 204 mg/dL — ABNORMAL HIGH (ref 70–99)
Glucose-Capillary: 206 mg/dL — ABNORMAL HIGH (ref 70–99)
Glucose-Capillary: 220 mg/dL — ABNORMAL HIGH (ref 70–99)

## 2018-12-06 LAB — HEMOGLOBIN A1C
Hgb A1c MFr Bld: 5 % (ref 4.8–5.6)
Mean Plasma Glucose: 96.8 mg/dL

## 2018-12-06 LAB — AMMONIA: Ammonia: 46 umol/L — ABNORMAL HIGH (ref 9–35)

## 2018-12-06 LAB — HEPARIN INDUCED PLATELET AB (HIT ANTIBODY): Heparin Induced Plt Ab: 0.065 OD (ref 0.000–0.400)

## 2018-12-06 MED ORDER — DILTIAZEM HCL-DEXTROSE 125-5 MG/125ML-% IV SOLN (PREMIX)
5.0000 mg/h | INTRAVENOUS | Status: DC
  Administered 2018-12-06: 5 mg/h via INTRAVENOUS
  Administered 2018-12-07: 15 mg/h via INTRAVENOUS
  Filled 2018-12-06 (×4): qty 125

## 2018-12-06 MED ORDER — METOPROLOL TARTRATE 25 MG/10 ML ORAL SUSPENSION
25.0000 mg | Freq: Four times a day (QID) | ORAL | Status: DC
  Administered 2018-12-06 (×3): 25 mg
  Filled 2018-12-06 (×3): qty 10

## 2018-12-06 MED ORDER — INSULIN ASPART 100 UNIT/ML ~~LOC~~ SOLN
0.0000 [IU] | SUBCUTANEOUS | Status: DC
  Administered 2018-12-06: 2 [IU] via SUBCUTANEOUS
  Administered 2018-12-06 (×2): 1 [IU] via SUBCUTANEOUS
  Administered 2018-12-07: 03:00:00 2 [IU] via SUBCUTANEOUS
  Administered 2018-12-07 (×2): 1 [IU] via SUBCUTANEOUS

## 2018-12-06 NOTE — Progress Notes (Addendum)
NEUROLOGY PROGRESS NOTE  Subjective: Patient intubated but on no sedation  Exam: Vitals:   12/06/18 0733 12/06/18 0800  BP: 132/73   Pulse: (!) 131   Resp: 16   Temp:  99.4 F (37.4 C)  SpO2: 100%    Physical Exam  Constitutional: Appears well-developed and well-nourished.  Psych: Intubated Eyes: No scleral injection HENT: No OP obstrucion Head: Normocephalic.  Cardiovascular: Normal rate and regular rhythm.  Respiratory: Intubated but breathing over the vent GI: Soft.  No distension.  Skin: WDI-anasarca Neuro:  Mental Status: Intubated, minimally responsive to noxious stimuli. Opens eyes. Does not follow commands Cranial Nerves: II: No blink to threat bilaterally III,IV, VI: Eyes are Closed but slightly opens with prolonged noxious stimuli.  When opened eyes are noted to be roving.  Pupils sluggish 2 mm to 1 mm but reactive  V,VII: smile symmetric,  Motor: To noxious stimuli right leg shows total movement, to noxious stimuli left leg shows no movement, flaccid bilateral upper extremities even to noxious stimuli.  No purposeful movements Sensory: Winces eyes to noxious stimuli in bilateral upper extremities Plantars: Right: Equivocal   left: downgoing    Medications:  Scheduled: . aspirin  81 mg Per Tube Daily  . B-complex with vitamin C  1 tablet Per Tube Daily  . chlorhexidine gluconate (MEDLINE KIT)  15 mL Mouth Rinse BID  . Chlorhexidine Gluconate Cloth  6 each Topical Daily  . Chlorhexidine Gluconate Cloth  6 each Topical Q0600  . cycloSPORINE  75 mg Per Tube BID  . feeding supplement (PRO-STAT SUGAR FREE 64)  30 mL Per Tube Daily  . hydrocortisone sod succinate (SOLU-CORTEF) inj  100 mg Intravenous Q8H  . lactulose  10 g Per Tube Daily  . mouth rinse  15 mL Mouth Rinse 10 times per day  . metoprolol tartrate  12.5 mg Per Tube Q6H  . mupirocin ointment   Topical Daily  . pantoprazole (PROTONIX) IV  40 mg Intravenous Daily  . sulfamethoxazole-trimethoprim  10  mL Per Tube Q M,W,F-2000  . valganciclovir  450 mg Per Tube Q M,W,F-2000   Continuous: . sodium chloride 10 mL/hr at 12/06/18 0800  . dexmedetomidine (PRECEDEX) IV infusion Stopped (12/05/18 1007)  . feeding supplement (VITAL 1.5 CAL) 40 mL/hr at 12/06/18 0800  . phenylephrine (NEO-SYNEPHRINE) Adult infusion 10 mcg/min (12/05/18 1200)    Pertinent Labs/Diagnostics:   Mr Brain Wo Contrast  Result Date: 12/05/2018  IMPRESSION: No acute abnormality.  Atrophy and mild chronic white matter changes Interval development of air-fluid levels in both maxillary sinuses. Interval development of bilateral mastoid effusion and mucosal edema throughout the paranasal sinuses. Electronically Signed   By: Franchot Gallo M.D.   On: 12/05/2018 16:39   Dg Chest Port 1 View  Result Date: 12/06/2018  IMPRESSION: 1. Endotracheal tube terminates 5.3 cm above the carina. 2. Right lower lobe airspace disease and probable small effusion is concerning for infection or aspiration. Electronically Signed   By: San Morelle M.D.   On: 12/06/2018 07:11   EEG: Study is suggestive of severe diffuse encephalopathy, nonspecific to etiology but could be secondary to sedation, hypoxic/anoxic brain injury, toxic metabolic causes.  No seizures or epileptiform discharges were seen throughout the recording  Etta Quill PA-C [facility] [phone]  Assessment:  63 year old female who suffered from 1 week of confusion. Subsequently developed asystole for 11 minutes on hospital.  After ROSC patient remained obtunded on Precedex.  Once Precedex was turned off patient remained obtunded.  Initially  patient had multiple metabolic abnormalities which included ammonia of 140-however now decreased to 46, elevated creatinine and is on hemodialysis, as stated initial AST was 37, however status post cardiac arrest AST jumped to 985-that said over the last 4 days has decreased to 118, ALT initially was 44 however after  cardiac arrest increased to 591-at this point over the last 4 days has decreased to 607.   On initial consult there was concern for seizure however EEG showed no seizure activity but diffuse slowing.   MRI was obtained which did not show any acute abnormality or signs of anoxic brain injury - hypoxic ischemic encephaloapthy still remains in differential along with toxic metabolic encephalopathy. Patient continues to have no leukocytosis and has remained afebrile. -Exam today shows patient minimally responding to noxious stimuli, eyes roving with intact brainstem reflexes.   Impression: -Multifactorial Toxic/Metabolic encephalopathy, could definitely have a component of hypoxic ischemic injury-no evidence of frank anoxic brain injury on imaging   Recommendations: -Continue to treat metabolic abnormalities, patient will take time to show improvement as the brain responds slower with improvement of metabolic issues. -Continue to obtain ammonia, liver function tests to evaluate for improvement lab findings -At this point patient will require supportive care and time with continued evaluation for improvement  We will follow her along with you  12/06/2018, 8:34 AM  Attending Neurohospitalist Addendum Patient seen and examined with APP/Resident. Agree with the history and physical as documented above. Agree with the plan as documented, which I helped formulate. I have independently reviewed the chart, obtained history, review of systems and examined the patient.I have personally reviewed pertinent head/neck/spine imaging (CT/MRI). Please feel free to call with any questions. --- Amie Portland, MD [facility] Pager: [phone]  If 7pm to 7am, please call on call as listed on AMION.  CRITICAL CARE ATTESTATION Performed by: Amie Portland, MD Total critical care time: 31 minutes Critical care time was exclusive of separately billable procedures and treating other patients and/or  supervising APPs/Residents/Students Critical care was necessary to treat or prevent imminent or life-threatening deterioration due to multifactorial toxic metabolic encephalopathy This patient is critically ill and at significant risk for neurological worsening and/or death and care requires constant monitoring. Critical care was time spent personally by me on the following activities: development of treatment plan with patient and/or surrogate as well as nursing, discussions with consultants, evaluation of patient's response to treatment, examination of patient, obtaining history from patient or surrogate, ordering and performing treatments and interventions, ordering and review of laboratory studies, ordering and review of radiographic studies, pulse oximetry, re-evaluation of patient's condition, participation in multidisciplinary rounds and medical decision making of high complexity in the care of this patient.

## 2018-12-06 NOTE — Progress Notes (Addendum)
Patient's HR 170's with SBP in 80's and 90's. Art line waveform was only perfusing every couple of beats with wide variation. Dr. Marylou Flesher made aware by Earnestine Leys and HD treatment was terminated. Following rinse back, patient's HR 130's with consistent arterial pressure SBP 120's.

## 2018-12-06 NOTE — Progress Notes (Signed)
[facility] Progress Note  Subjective:   HD postponed to today  MRI neg for anoxic changes  Off PE gtt  Remains tachy in 130s  Vitals:   12/06/18 0600 12/06/18 0700 12/06/18 0733 12/06/18 0800  BP: 113/82  132/73 (!) 117/102  Pulse: (!) 134 (!) 134 (!) 131 (!) 133  Resp: 19 (!) 24 16 (!) 23  Temp:    99.4 F (37.4 C)  TempSrc:    Oral  SpO2: 100% 100% 100% 100%  Weight:      Height:        Inpatient medications: . aspirin  81 mg Per Tube Daily  . B-complex with vitamin C  1 tablet Per Tube Daily  . chlorhexidine gluconate (MEDLINE KIT)  15 mL Mouth Rinse BID  . Chlorhexidine Gluconate Cloth  6 each Topical Daily  . Chlorhexidine Gluconate Cloth  6 each Topical Q0600  . cycloSPORINE  75 mg Per Tube BID  . feeding supplement (PRO-STAT SUGAR FREE 64)  30 mL Per Tube Daily  . hydrocortisone sod succinate (SOLU-CORTEF) inj  100 mg Intravenous Q8H  . lactulose  10 g Per Tube Daily  . mouth rinse  15 mL Mouth Rinse 10 times per day  . metoprolol tartrate  12.5 mg Per Tube Q6H  . mupirocin ointment   Topical Daily  . pantoprazole (PROTONIX) IV  40 mg Intravenous Daily  . sulfamethoxazole-trimethoprim  10 mL Per Tube Q M,W,F-2000  . valganciclovir  450 mg Per Tube Q M,W,F-2000   . sodium chloride 10 mL/hr at 12/06/18 0800  . dexmedetomidine (PRECEDEX) IV infusion Stopped (12/05/18 1007)  . feeding supplement (VITAL 1.5 CAL) 40 mL/hr at 12/06/18 0800  . phenylephrine (NEO-SYNEPHRINE) Adult infusion 10 mcg/min (12/05/18 1200)   sodium chloride, acetaminophen **OR** acetaminophen, albuterol, docusate, fentaNYL (SUBLIMAZE) injection, metoprolol tartrate, polyethylene glycol    Exam: Intubated, not awake/alert Tachy, regular Coarse bs b/l LUE AV +B/T No LEE No rashes/lesion   Dialysis: [facility] MWF   4h  400/800  2/2 bath  P4  42.5kg  Hep 2000  LUE AVF  M- 225 q2wk, last 11/24  Recent labs: Hb 10.3, ca 10.5, P 4.9     Cxr 11/27 - no edema  Assessment/  Plan: 1. S/p asystolic cardiac arrest / bradycardia: Required 11 minutes of CPR on 11/08/2018. CTA neg for PE.  Seen by cardiology. S/p empiric antibiotics, blood cx's NGF  2. AMS:  Neuro following. Patient is post-arrest on 11/26. EEG neg, MRI w/o changes.  HOpeful for slow recovery.  3.  ESRD:  MWF HD. Had HD in ICU 11/27, HD not tolerated 11/30, aborted.  Reattempt today.  BP better, tolerate tachycardia.   4.  Hypertension/volume: improved now off PE 5.  Anemia: Hgb 9s, CTM. Not due for ESA. 6.  Metabolic bone disease: Ca and P stable, CTM\ 7. Recent failed renal Transplant attempt: Sept/ Oct at [facility], primary nonfunction, is getting her CyA and prednisone here.    Rexene Agent  12/06/2018, 9:13 AM  Iron/TIBC/Ferritin/ %Sat    Component Value Date/Time   IRON 42 07/17/2014 2244   TIBC 290 07/17/2014 2244   FERRITIN 61 07/17/2014 2244   IRONPCTSAT 14 07/17/2014 2244   Recent Labs  Lab 12/01/18 0217  12/04/18 0833 12/05/18 1136 12/06/18 0300  NA  --    < > 135  --  137  K  --    < > 3.7  --  3.8  CL  --    < >  99  --  103  CO2  --    < > 22  --  16*  GLUCOSE  --    < > 137*  --  183*  BUN  --    < > 34*  --  100*  CREATININE  --    < > 2.50*  --  5.14*  CALCIUM  --    < > 8.0*  --  8.0*  PHOS  --    < > 1.2*  --   --   ALBUMIN  --    < > 2.4* 1.8*  --   INR 1.8*  --   --   --   --    < > = values in this interval not displayed.   Recent Labs  Lab 12/05/18 1136  AST 118*  ALT 607*  ALKPHOS 89  BILITOT 1.0  PROT 4.3*   Recent Labs  Lab 12/06/18 0300  WBC 4.1  HGB 9.8*  HCT 30.5*  PLT 85*

## 2018-12-06 NOTE — Progress Notes (Signed)
Pt. HR 160's-170 uf off goal decreased to 525ml. Pt stable will continue to monitor

## 2018-12-06 NOTE — Progress Notes (Signed)
RT note: patient does not meet SBT criteria this AM due to HR elevation.  Tolerating current ventilator settings well.  Will continue to monitor.

## 2018-12-06 NOTE — Progress Notes (Signed)
Pt. bp continues to decrease with increasing HR 170's. Unable to tolerate HD. Dr. Joelyn Oms made aware ok to stop HD tx at this time. Pt. Rinsed back pt. Stable. Primary nurse at bedside

## 2018-12-06 NOTE — Progress Notes (Signed)
NAME:  Erin Morgan, MRN:  [id number], DOB:  24-Oct-1955, LOS: 6 ADMISSION DATE:  11/11/2018, CONSULTATION DATE:  12/01/2018 REFERRING MD:  Dr. Doristine Bosworth CHIEF COMPLAINT:  Cardiac arrest  Brief History   This is a 63 year old woman with history of end-stage renal disease, with a recent admission for symptomatic anemia requiring transfusion.  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  She subsequently developed graft failure and is now back on dialysis.  She was having weakness and lethargy at home, and did complete her full run of dialysis on Tuesday.  She was admitted on 11/26 for weakness and lethargy.  Shortly after admission she developed cardiac arrest with asystole and received ACLS protocol for 11 minutes.  She was intubated and was subsequently bradycardic, for which she was placed on a dopamine infusion with minimal benefit.  She was then transcutaneously paced.  When neither of these work she was transitioned to an epinephrine drip.  She was admitted to the [facility] ICU and PCCM was consulted for further management.  Consults:  Nephrology Neurology  Procedures:   11/27 Intubation 11/27 arterial line 11/27 central line  Based on OSH reports: TTE showed mildly reduced left ventricular systolic function with LVEF 45%. Of note, LHC at OSH 05/2018 showed nonobstructive CAD per documentation  11/27 ctpa: 1. There is no evidence for acute pulmonary embolus. 2. There is bibasilar airspace disease favored to represent a combination of atelectasis and consolidation. 3. Bilateral nondisplaced rib fractures likely related to cardiopulmonary resuscitation. There appears to be a fracture involving the inferior sternum  4. Bilateral pleural effusions, right greater than left. 5. Cardiomegaly. 6. Multinodular goiter with a dominant left-sided thyroid nodule measuring approximately 1.5 cm.  7. Small amount of free  fluid in the upper abdomen. 11/27 cth: 1. No acute intracranial abnormality. 2. Stable parenchymal atrophy and chronic microvascular angiopathy. 11/26 MRI: 1. No evidence of acute intracranial abnormality. 2. Mild generalized parenchymal atrophy and chronic small vessel ischemic disease. 11/27 echo: LVEF 45-50% with mod LVH, Grade III diastolic dysfunction, mild AS, mod LAE, mild TR  Micro Data:  Blood cultures 11/26: ngtd 11/26: sars2: neg  Antimicrobials:  11/27 cefepime>> 11/26->11/27 11/27 vancomycin CHRONIC MEDS:  11/27 valganciclovir (MWF) 11/27 bactrim (MWF) 11/27 cyclosporine  Interim history/subjective:  Sedation held and patient was noted to have twitching, neurology consulted, MRI Brain and EEG ordered. EEG consistent with diffuse encephalopathy (nonspecific). MRI Brain with no acute abnormality Neo weaned off  Objective   Blood pressure (!) 117/102, pulse (!) 133, temperature 99.4 F (37.4 C), temperature source Oral, resp. rate (!) 23, height 5\' 4"  (1.626 m), weight 52.2 kg, SpO2 100 %.    Vent Mode: PRVC FiO2 (%):  [30 %-40 %] 40 % Set Rate:  [18 bmp] 18 bmp Vt Set:  [430 mL-460 mL] 430 mL PEEP:  [5 cmH20] 5 cmH20 Plateau Pressure:  [11 cmH20-16 cmH20] 16 cmH20   Intake/Output Summary (Last 24 hours) at 12/06/2018 0910 Last data filed at 12/06/2018 0800 Gross per 24 hour  Intake 816.93 ml  Output 625 ml  Net 191.93 ml   Filed Weights   12/04/18 0855 12/05/18 0400 12/06/18 0214  Weight: 50.2 kg 52.3 kg 52.2 kg    Examination: General: encephalopathic female on mechanical ventilator HENT: [place]/AT Lungs: CTA, thick white secretions via ETT.  Cardiovascular: ST on telemetry, warm and well perfused. No murmur Abdomen: soft, non tender, non distended Extremities:.  Warm and well perfused Neuro: non eye opening, roving side to side, PERRL, no purposeful movement, no movement in extremities   Assessment & Plan:  A/P  Cardiac arrest Bradycardia, Asystole  On 11/26 Transient complete heart block Type II NSTEMI -unclear etiology H/O Atrial tachycardia:  -Restarted Lopressor, Cardizem on hold -ASA per Cards. Statin on hold in setting of elevated LFTs  Shock: RESOVLED -suspect adrenal insufficiency  -vasopressors weaned off 12/1 -On stress dose steroids (D3), change to home dose tomorrow if remains off vasopressors  Acute encephalopathy: -s/p cardiac arrest,  -Neurology following, suspect metabolic encephalopathy -Elevated ammonia: 55. Continue lactulose -Will need HD  Acute hypoxemic respiratory failure -Neurological exam and   End-stage renal disease secondary to atypical HUS Primary graft failure of deceased donor renal transplant 09/2018 Nephrology following -CyA, Valcyte, and stress dose steroids -Plan for HD today  Rheumatoid Arthritis on hydroxychloroquine (on hold)  Chronic normocytic anemia:  -likely 2/2 esrd -follow, no acute indication for transfusion  Thrombocytopenia:  -4T Score: 4: intermediate risk -HIT panel sent 12/1 and pending  Best practice:  Diet: Continue tube feeds Pain/Anxiety/Delirium protocol (if indicated): precedex, prn fentanyl VAP protocol (if indicated): per protocol DVT prophylaxis:  GI prophylaxis: PPI Glucose control: Controlled, under 180 Foley anuric Mobility: bed rest Code Status: Full Code Family Communication: Monta Zisman: [phone], will call and update.  Disposition:  ICU.    The patient is critically ill with multiple organ systems failure and requires high complexity decision making for assessment and support, frequent evaluation and titration of therapies, application of advanced monitoring technologies and extensive interpretation of multiple databases. Critical Care Time devoted to patient care services described in this note independent of APP/resident  time is 35 minutes.   CCT: 40 min Paulita Fujita, ACNP [facility]

## 2018-12-06 NOTE — Progress Notes (Signed)
[facility] Progress Note Patient Name: Erin Morgan DOB: 01-15-1955 MRN: [id number]   Date of Service  12/06/2018  HPI/Events of Note  AFIB with RVR - Ventricular rate = 130's-140's. BP = 136/74 by A-line. Patient has prolonged QTc interval, therefore, can't use Amiodarone.   eICU Interventions  Will order: 1. 12 EKG now. 2. BMP and Mg++ level STAT. 3. Cardizem IV infusion. Titrate to HR = 65-105.  4. Cycle Troponin.      Intervention Category Major Interventions: Arrhythmia - evaluation and management  Sommer,Steven Eugene 12/06/2018, 10:38 PM

## 2018-12-06 NOTE — Progress Notes (Addendum)
Unable to UF pt. Due to HR >160 nonsustaining and low bp as documented. Dr. Joelyn Oms paged and made aware orders to continue tx as long as systolic arterial pressure >100 MAP 60. Monitor pt. Pt stable

## 2018-12-06 NOTE — Progress Notes (Signed)
Progress Note  Patient Name: Erin Morgan Date of Encounter: 12/06/2018  Primary Cardiologist: Mertie Moores, MD   Subjective   Intubated but off sedation since yesterday. Minimally responsive to stimulus on my exam  Inpatient Medications    Scheduled Meds: . aspirin  81 mg Per Tube Daily  . B-complex with vitamin C  1 tablet Per Tube Daily  . chlorhexidine gluconate (MEDLINE KIT)  15 mL Mouth Rinse BID  . Chlorhexidine Gluconate Cloth  6 each Topical Daily  . Chlorhexidine Gluconate Cloth  6 each Topical Q0600  . cycloSPORINE  75 mg Per Tube BID  . feeding supplement (PRO-STAT SUGAR FREE 64)  30 mL Per Tube Daily  . hydrocortisone sod succinate (SOLU-CORTEF) inj  100 mg Intravenous Q8H  . lactulose  10 g Per Tube Daily  . mouth rinse  15 mL Mouth Rinse 10 times per day  . metoprolol tartrate  25 mg Per Tube Q6H  . mupirocin ointment   Topical Daily  . pantoprazole (PROTONIX) IV  40 mg Intravenous Daily  . sulfamethoxazole-trimethoprim  10 mL Per Tube Q M,W,F-2000  . valganciclovir  450 mg Per Tube Q M,W,F-2000   Continuous Infusions: . sodium chloride 10 mL/hr at 12/06/18 0800  . dexmedetomidine (PRECEDEX) IV infusion Stopped (12/05/18 1007)  . feeding supplement (VITAL 1.5 CAL) 40 mL/hr at 12/06/18 0800  . phenylephrine (NEO-SYNEPHRINE) Adult infusion 10 mcg/min (12/05/18 1200)   PRN Meds: sodium chloride, acetaminophen **OR** acetaminophen, albuterol, docusate, fentaNYL (SUBLIMAZE) injection, metoprolol tartrate, polyethylene glycol   Vital Signs    Vitals:   12/06/18 0600 12/06/18 0700 12/06/18 0733 12/06/18 0800  BP: 113/82  132/73 (!) 117/102  Pulse: (!) 134 (!) 134 (!) 131 (!) 133  Resp: 19 (!) 24 16 (!) 23  Temp:    99.4 F (37.4 C)  TempSrc:    Oral  SpO2: 100% 100% 100% 100%  Weight:      Height:        Intake/Output Summary (Last 24 hours) at 12/06/2018 1058 Last data filed at 12/06/2018 0800 Gross per 24 hour  Intake 755.69 ml  Output 625 ml   Net 130.69 ml   Last 3 Weights 12/06/2018 12/05/2018 12/04/2018  Weight (lbs) 115 lb 1.3 oz 115 lb 4.8 oz 110 lb 10.7 oz  Weight (kg) 52.2 kg 52.3 kg 50.2 kg  Some encounter information is confidential and restricted. Go to Review Flowsheets activity to see all data.      Telemetry    Consistently HR 130s, largely regular with occasional slows, appears to be sinus tach with 1st degree block - Personally Reviewed   ECG    12/1 Sinus tach with 1st degree AV block - Personally Reviewed  Physical Exam   GEN: intubated but not sedated HEENT: Normal, moist mucous membranes NECK: No JVD CARDIAC: tachycardic regular rhythm, normal S1 and S2, no rubs or gallops. No murmur. VASCULAR: Radial and DP pulses 2+ bilaterally. No carotid bruits RESPIRATORY:  Clear to auscultation without rales, wheezing or rhonchi in anterior/lateral fields ABDOMEN: Soft, non-tender, non-distended MUSCULOSKELETAL:  Moves to stimuli somewhat SKIN: Warm and dry, no edema NEUROLOGIC:  Minimal response to stimuli PSYCHIATRIC:  Minimal response to stimuli  Labs    High Sensitivity Troponin:   Recent Labs  Lab 12/01/18 0106 12/01/18 0945 12/01/18 1615 12/03/18 0630 12/03/18 0951  TROPONINIHS 1,936* 4,355* 7,503* 12,203* 11,210*      Chemistry Recent Labs  Lab 12/01/18 0304  12/01/18 0810  12/03/18 0630  12/04/18 0833 12/05/18 1136 12/06/18 0300  NA 132*   < > 134*   < > 135 135  --  137  K 5.5*   < > 5.7*   < > 3.6 3.7  --  3.8  CL 90*  --  91*   < > 97* 99  --  103  CO2 11*  --  8*   < > 22 22  --  16*  GLUCOSE 179*  --  117*   < > 152* 137*  --  183*  BUN 42*  --  44*   < > 44* 34*  --  100*  CREATININE 4.95*  --  5.15*   < > 3.59* 2.50*  --  5.14*  CALCIUM 8.5*  --  8.8*   < > 8.2* 8.0*  --  8.0*  PROT 4.6*  --  4.9*  --   --   --  4.3*  --   ALBUMIN 2.4*  --  2.4*  --   --  2.4* 1.8*  --   AST 1,664*  --  2,761*  --   --   --  118*  --   ALT 962*  --  1,386*  --   --   --  607*  --    ALKPHOS 73  --  83  --   --   --  89  --   BILITOT 1.5*  --  2.4*  --   --   --  1.0  --   GFRNONAA 9*  --  8*   < > 13* 20*  --  8*  GFRAA 10*  --  10*   < > 15* 23*  --  10*  ANIONGAP 31*  --  35*   < > 16* 14  --  18*   < > = values in this interval not displayed.     Hematology Recent Labs  Lab 12/04/18 0420 12/05/18 0439 12/06/18 0300  WBC 7.6 6.2 4.1  RBC 2.90* 2.93* 3.09*  HGB 9.1* 9.2* 9.8*  HCT 28.9* 28.7* 30.5*  MCV 99.7 98.0 98.7  MCH 31.4 31.4 31.7  MCHC 31.5 32.1 32.1  RDW 20.0* 21.2* 21.8*  PLT 118* 95* 85*    BNPNo results for input(s): BNP, PROBNP in the last 168 hours.   DDimer  Recent Labs  Lab 12/01/18 1230  DDIMER >20.00*     Radiology    Mr Brain Wo Contrast  Result Date: 12/05/2018 CLINICAL DATA:  Cardiac arrest with anoxic injury. Prolonged altered mental status. EXAM: MRI HEAD WITHOUT CONTRAST TECHNIQUE: Multiplanar, multiecho pulse sequences of the brain and surrounding structures were obtained without intravenous contrast. COMPARISON:  MRI head 11/12/2018 FINDINGS: Brain: Ventricular enlargement and atrophy, unchanged from the prior study. Negative for acute infarct. Minimal chronic white matter changes stable. Negative for hemorrhage or mass. No edema or midline shift. Vascular: Normal arterial flow voids. Skull and upper cervical spine: No focal bony abnormality Sinuses/Orbits: Air-fluid levels in the maxillary sinus bilaterally are new since the prior study. Mild mucosal edema elsewhere in the paranasal sinuses. Bilateral mastoid effusions are new. No orbital lesion. Other: None IMPRESSION: No acute abnormality.  Atrophy and mild chronic white matter changes Interval development of air-fluid levels in both maxillary sinuses. Interval development of bilateral mastoid effusion and mucosal edema throughout the paranasal sinuses. Electronically Signed   By: Franchot Gallo M.D.   On: 12/05/2018 16:39   Dg Chest [facility] 1 View  Result  Date: 12/06/2018  CLINICAL DATA:  Acute respiratory failure. EXAM: PORTABLE CHEST 1 VIEW COMPARISON:  One-view chest x-ray 12/01/2018 FINDINGS: Endotracheal tube terminates 5.3 cm above the carina. Heart is enlarged. Asymmetric patchy airspace disease is present at the lung bases, right greater than left. A small effusion is suspected on the right. No significant left-sided airspace disease is present. IMPRESSION: 1. Endotracheal tube terminates 5.3 cm above the carina. 2. Right lower lobe airspace disease and probable small effusion is concerning for infection or aspiration. Electronically Signed   By: San Morelle M.D.   On: 12/06/2018 07:11    Cardiac Studies   Echo 12/01/18   1. Left ventricular ejection fraction, by visual estimation, is 45 to 50%. The left ventricle has mildly decreased function. There is moderately increased left ventricular hypertrophy.  2. Left ventricular diastolic parameters are consistent with Grade III diastolic dysfunction (restrictive).  3. Global right ventricle has normal systolic function.The right ventricular size is normal.  4. Left atrial size was moderately dilated.  5. Right atrial size was normal.  6. Moderate mitral annular calcification.  7. The mitral valve is normal in structure. Trace mitral valve regurgitation. No evidence of mitral stenosis.  8. The tricuspid valve is normal in structure. Tricuspid valve regurgitation is mild.  9. The aortic valve is tricuspid. Aortic valve regurgitation is not visualized. Mild aortic valve stenosis. 10. The pulmonic valve was normal in structure. Pulmonic valve regurgitation is trivial. 11. Mildly elevated pulmonary artery systolic pressure. 12. The inferior vena cava is dilated in size with >50% respiratory variability, suggesting right atrial pressure of 8 mmHg. 13. Mild global reduction in LV systolic function; restrictive filling; moderate LVH; myocardium with speckled appearance (consider amyloid); mild AS (mean  gradient 14 mmHg); moderate LAE; mild TR.  Patient Profile     63 y.o. female with complex course, presented with altered mental status, course complicated by hypoxic cardiac arrest. Has history of ESRD on HD, rheumatoid arthritis, ectopic atrial tachycardia.  Assessment & Plan    Atrial tachycardia: now improved, though appears to be in stable sinus tach at about 130 bpm -tolerating low dose metoprolol q6h. This is controlling the atrial tach but allowing sinus tach, which is likely 2/2 her critical illness. She is now off phenylephrine and maintaining her BP. Will increase metoprolol dose today and monitor carefully -no significant bradycardia since cardiac arrest (felt 2/2 respiratory), but monitor closely. If bradycardia returns, need to discuss temp perm or other pacing for tachy-brady syndrome -with acute liver injury, would avoid amiodarone.  -I reviewed her telemetry at length today. It is unclear what the trigger was, but on 11/30 afternoon she went from atrial bigeminy to this consistent sinus tachycardia and has remained at this rate. No clear triggers that I can find. No further high spikes in heart rate such as seen 11/30  Elevated troponin: normal coronaries earlier this year. Complicated in the setting of cardiac arrest/CPR. Peak [id number], now downtrending. Unlikely to be ACS. -mildly depressed EF on recent echo, restrictive diastolic dysfunction -suspect demand ischemia given cardiac arrest/critical illness, but cannot exclude ACS. However, low suspicion for this, no STEMI on ECG, prior normal coronaries. No acute indication for cath. -ok to continue aspirin as long as there is no bleeding -limited data for statins for prevention in ESRD, but tolerated atorvastatin 10 mg 3x/week as an outpatient. With acute liver injury from cardiac arrest, would not restart until liver enzymes normalize compeltely  We will monitor her response to increased metoprolol.  CRITICAL CARE Patient is  critically ill with multiple organ systems affected and requires high complexity decision making. Total critical care time: 31 minutes. This time includes gathering of history, evaluation of patient's response to treatment, examination of patient, review of laboratory and imaging studies, and coordination with consultants.   For questions or updates, please contact [facility] Please consult [email or web address] for contact info under     Signed, Buford Dresser, MD  12/06/2018, 10:58 AM

## 2018-12-07 ENCOUNTER — Inpatient Hospital Stay (HOSPITAL_COMMUNITY): Payer: Medicare Other

## 2018-12-07 DIAGNOSIS — N186 End stage renal disease: Secondary | ICD-10-CM | POA: Diagnosis not present

## 2018-12-07 DIAGNOSIS — G9340 Encephalopathy, unspecified: Secondary | ICD-10-CM | POA: Diagnosis not present

## 2018-12-07 DIAGNOSIS — Z992 Dependence on renal dialysis: Secondary | ICD-10-CM | POA: Diagnosis not present

## 2018-12-07 DIAGNOSIS — R0603 Acute respiratory distress: Secondary | ICD-10-CM | POA: Diagnosis not present

## 2018-12-07 LAB — CBC
HCT: 28 % — ABNORMAL LOW (ref 36.0–46.0)
Hemoglobin: 9.1 g/dL — ABNORMAL LOW (ref 12.0–15.0)
MCH: 32.3 pg (ref 26.0–34.0)
MCHC: 32.5 g/dL (ref 30.0–36.0)
MCV: 99.3 fL (ref 80.0–100.0)
Platelets: 69 10*3/uL — ABNORMAL LOW (ref 150–400)
RBC: 2.82 MIL/uL — ABNORMAL LOW (ref 3.87–5.11)
RDW: 22.4 % — ABNORMAL HIGH (ref 11.5–15.5)
WBC: 5.1 10*3/uL (ref 4.0–10.5)
nRBC: 5.5 % — ABNORMAL HIGH (ref 0.0–0.2)

## 2018-12-07 LAB — BASIC METABOLIC PANEL
Anion gap: 18 — ABNORMAL HIGH (ref 5–15)
Anion gap: 15 (ref 5–15)
Anion gap: 18 — ABNORMAL HIGH (ref 5–15)
BUN: 100 mg/dL — ABNORMAL HIGH (ref 8–23)
BUN: 91 mg/dL — ABNORMAL HIGH (ref 8–23)
BUN: 95 mg/dL — ABNORMAL HIGH (ref 8–23)
CO2: 16 mmol/L — ABNORMAL LOW (ref 22–32)
CO2: 16 mmol/L — ABNORMAL LOW (ref 22–32)
CO2: 17 mmol/L — ABNORMAL LOW (ref 22–32)
Calcium: 8 mg/dL — ABNORMAL LOW (ref 8.9–10.3)
Calcium: 7.4 mg/dL — ABNORMAL LOW (ref 8.9–10.3)
Calcium: 7.6 mg/dL — ABNORMAL LOW (ref 8.9–10.3)
Chloride: 103 mmol/L (ref 98–111)
Chloride: 104 mmol/L (ref 98–111)
Chloride: 100 mmol/L (ref 98–111)
Creatinine, Ser: 5.14 mg/dL — ABNORMAL HIGH (ref 0.44–1.00)
Creatinine, Ser: 4.6 mg/dL — ABNORMAL HIGH (ref 0.44–1.00)
Creatinine, Ser: 4.68 mg/dL — ABNORMAL HIGH (ref 0.44–1.00)
GFR calc Af Amer: 10 mL/min — ABNORMAL LOW (ref >60)
GFR calc Af Amer: 11 mL/min — ABNORMAL LOW (ref >60)
GFR calc Af Amer: 11 mL/min — ABNORMAL LOW (ref >60)
GFR calc non Af Amer: 8 mL/min — ABNORMAL LOW (ref >60)
GFR calc non Af Amer: 9 mL/min — ABNORMAL LOW (ref >60)
GFR calc non Af Amer: 9 mL/min — ABNORMAL LOW (ref >60)
Glucose, Bld: 183 mg/dL — ABNORMAL HIGH (ref 70–99)
Glucose, Bld: 178 mg/dL — ABNORMAL HIGH (ref 70–99)
Glucose, Bld: 227 mg/dL — ABNORMAL HIGH (ref 70–99)
Potassium: 3.8 mmol/L (ref 3.5–5.1)
Potassium: 3.6 mmol/L (ref 3.5–5.1)
Potassium: 3.6 mmol/L (ref 3.5–5.1)
Sodium: 137 mmol/L (ref 135–145)
Sodium: 135 mmol/L (ref 135–145)
Sodium: 135 mmol/L (ref 135–145)

## 2018-12-07 LAB — MAGNESIUM: Magnesium: 2.8 mg/dL — ABNORMAL HIGH (ref 1.7–2.4)

## 2018-12-07 LAB — CSF CELL COUNT WITH DIFFERENTIAL
RBC Count, CSF: 0 /mm3
Tube #: 3
WBC, CSF: 1 /mm3 (ref 0–5)

## 2018-12-07 LAB — TROPONIN I (HIGH SENSITIVITY)
Troponin I (High Sensitivity): 6323 ng/L (ref <18)
Troponin I (High Sensitivity): 6190 ng/L (ref <18)

## 2018-12-07 LAB — GLUCOSE, CSF: Glucose, CSF: 103 mg/dL — ABNORMAL HIGH (ref 40–70)

## 2018-12-07 LAB — GLUCOSE, CAPILLARY
Glucose-Capillary: 204 mg/dL — ABNORMAL HIGH (ref 70–99)
Glucose-Capillary: 184 mg/dL — ABNORMAL HIGH (ref 70–99)
Glucose-Capillary: 200 mg/dL — ABNORMAL HIGH (ref 70–99)

## 2018-12-07 LAB — PROTEIN, CSF: Total  Protein, CSF: 59 mg/dL — ABNORMAL HIGH (ref 15–45)

## 2018-12-07 MED ORDER — METOPROLOL TARTRATE 25 MG/10 ML ORAL SUSPENSION
50.0000 mg | Freq: Four times a day (QID) | ORAL | Status: DC
  Administered 2018-12-07: 50 mg
  Filled 2018-12-07 (×2): qty 20

## 2018-12-07 MED ORDER — PRISMASOL BGK 4/2.5 32-4-2.5 MEQ/L REPLACEMENT SOLN
Status: DC
  Filled 2018-12-07 (×3): qty 5000

## 2018-12-07 MED ORDER — LIDOCAINE HCL (PF) 1 % IJ SOLN
5.0000 mL | Freq: Once | INTRAMUSCULAR | Status: AC
  Administered 2018-12-07: 5 mL via INTRADERMAL

## 2018-12-07 MED ORDER — MIDAZOLAM HCL 2 MG/2ML IJ SOLN: 1.0000 mg | Freq: Once | INTRAMUSCULAR | Status: DC

## 2018-12-07 MED ORDER — MIDAZOLAM HCL 2 MG/2ML IJ SOLN
INTRAMUSCULAR | Status: AC
  Filled 2018-12-07: qty 2

## 2018-12-07 MED ORDER — METOPROLOL TARTRATE 25 MG/10 ML ORAL SUSPENSION
25.0000 mg | Freq: Once | ORAL | Status: AC
  Administered 2018-12-07: 25 mg
  Filled 2018-12-07: qty 10

## 2018-12-07 MED ORDER — SODIUM CHLORIDE 0.9 % IV SOLN
2.0000 g | Freq: Two times a day (BID) | INTRAVENOUS | Status: DC
  Administered 2018-12-07: 11:00:00 2 g via INTRAVENOUS
  Filled 2018-12-07: qty 20

## 2018-12-07 MED ORDER — PRISMASOL BGK 4/2.5 32-4-2.5 MEQ/L REPLACEMENT SOLN
Status: DC
  Filled 2018-12-07 (×2): qty 5000

## 2018-12-07 MED ORDER — PRISMASOL BGK 4/2.5 32-4-2.5 MEQ/L IV SOLN
INTRAVENOUS | Status: DC
  Filled 2018-12-07 (×5): qty 5000

## 2018-12-07 MED ORDER — PREDNISOLONE SODIUM PHOSPHATE 15 MG/5ML PO SOLN
30.0000 mg | Freq: Every day | ORAL | Status: DC
  Administered 2018-12-07: 30 mg
  Filled 2018-12-07 (×2): qty 10

## 2018-12-07 MED ORDER — SODIUM CHLORIDE 0.9 % IV SOLN
2.0000 g | INTRAVENOUS | Status: DC
  Administered 2018-12-07: 2 g via INTRAVENOUS
  Filled 2018-12-07: qty 2
  Filled 2018-12-07: qty 2000

## 2018-12-07 MED ORDER — VANCOMYCIN HCL IN DEXTROSE 1-5 GM/200ML-% IV SOLN
1000.0000 mg | Freq: Once | INTRAVENOUS | Status: AC
  Administered 2018-12-07: 1000 mg via INTRAVENOUS
  Filled 2018-12-07: qty 200

## 2018-12-07 MED ORDER — HEPARIN SODIUM (PORCINE) 1000 UNIT/ML DIALYSIS
1000.0000 [IU] | INTRAMUSCULAR | Status: DC | PRN
  Filled 2018-12-07: qty 6

## 2018-12-08 ENCOUNTER — Telehealth: Payer: Self-pay

## 2018-12-08 LAB — HSV DNA BY PCR (REFERENCE LAB)
HSV 1 DNA: NEGATIVE
HSV 2 DNA: NEGATIVE

## 2018-12-08 NOTE — Telephone Encounter (Signed)
12/08/18 Recd DC from [facility]. Sending to [facility] for Dr. Ruthann Cancer to sign.  12/08/18 Recd signed DC from Dr. Ruthann Cancer. Faxed a copy to [facility] and called them to pick up original.

## 2018-12-10 LAB — CSF CULTURE W GRAM STAIN: Culture: NO GROWTH

## 2019-01-04 LAB — FUNGUS CULTURE RESULT

## 2019-01-04 LAB — FUNGUS CULTURE WITH STAIN

## 2019-01-04 LAB — FUNGAL ORGANISM REFLEX

## 2019-01-05 NOTE — Progress Notes (Signed)
NAME:  Erin Morgan, MRN:  [id number], DOB:  [birth year], LOS: 7 ADMISSION DATE:  11/16/2018, CONSULTATION DATE:  12/01/2018 REFERRING MD:  Dr. Doristine Bosworth CHIEF COMPLAINT:  Cardiac arrest  Brief History   This is a 64 year old woman with history of end-stage renal disease, with a recent admission for symptomatic anemia requiring transfusion.  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  She subsequently developed graft failure and is now back on dialysis.  She was having weakness and lethargy at home, and did complete her full run of dialysis on Tuesday.  She was admitted on 11/26 for weakness and lethargy.  Shortly after admission she developed cardiac arrest with asystole and received ACLS protocol for 11 minutes.  She was intubated and was subsequently bradycardic, for which she was placed on a dopamine infusion with minimal benefit.  She was then transcutaneously paced.  When neither of these work she was transitioned to an epinephrine drip.  She was admitted to the 8M ICU and PCCM was consulted for further management.  Consults:  Nephrology Neurology  Procedures:   11/27 Intubation 11/27 arterial line 11/27 central line  Based on OSH reports: TTE showed mildly reduced left ventricular systolic function with LVEF 45%. Of note, LHC at OSH 05/2018 showed nonobstructive CAD per documentation  11/27 ctpa: 1. There is no evidence for acute pulmonary embolus. 2. There is bibasilar airspace disease favored to represent a combination of atelectasis and consolidation. 3. Bilateral nondisplaced rib fractures likely related to cardiopulmonary resuscitation. There appears to be a fracture involving the inferior sternum  4. Bilateral pleural effusions, right greater than left. 5. Cardiomegaly. 6. Multinodular goiter with a dominant left-sided thyroid nodule measuring approximately 1.5 cm.  7. Small amount of free  fluid in the upper abdomen. 11/27 cth: 1. No acute intracranial abnormality. 2. Stable parenchymal atrophy and chronic microvascular angiopathy. 11/26 MRI: 1. No evidence of acute intracranial abnormality. 2. Mild generalized parenchymal atrophy and chronic small vessel ischemic disease. 11/27 echo: LVEF 45-50% with mod LVH, Grade III diastolic dysfunction, mild AS, mod LAE, mild TR 12/1: MRI brain: No acute abnormality.  Atrophy and mild chronic white matter changes  Interval development of air-fluid levels in both maxillary sinuses. Interval development of bilateral mastoid effusion and mucosal edema throughout the paranasal sinuses. 12/1 eeg: This study is suggestive of severe diffuse encephalopathy, nonspecific to etiology but could be secondary to sedation, hypoxic/anoxic brain injury, toxic-metabolic causes.. No seizures or epileptiform discharges were seen throughout the recording.  Micro Data:  Blood cultures 11/26: ngtd 11/26: sars2: neg  Antimicrobials:  11/27 cefepime>> 11/26->11/27 11/27 vancomycin CHRONIC MEDS:  11/27 valganciclovir (MWF) 11/27 bactrim (MWF) 11/27 cyclosporine: stopped 12/3  12/3: ceftriaxone 12/3 amp 12/3 vanc  Interim history/subjective:  12/3: sedation still held, back on neo overnight (albeit low dose). Dilt also resumed overnight. Remains sinus tack in 120's 12/2:Sedation held and patient was noted to have twitching, neurology consulted, MRI Brain and EEG ordered. EEG consistent with diffuse encephalopathy (nonspecific). MRI Brain with no acute abnormality Neo weaned off  Objective   Blood pressure (!) 101/56, pulse (!) 134, temperature 99.1 F (37.3 C), temperature source Oral, resp. rate (!) 24, height 5\' 4"  (1.626 m), weight 51.8 kg, SpO2 100 %.    Vent Mode: PRVC FiO2 (%):  [40 %-50 %] 40 % Set Rate:  [18 bmp] 18 bmp Vt Set:  [430 mL] 430 mL PEEP:  [5 cmH20]  Vickery Pressure:  [12 cmH20-15 cmH20] 13 cmH20   Intake/Output  Summary (Last 24 hours) at 2019-01-01 0825 Last data filed at 01-01-2019 0700 Gross per 24 hour  Intake 1302.09 ml  Output 259 ml  Net 1043.09 ml   Filed Weights   12/06/18 1245 12/06/18 1415 01-01-2019 0306  Weight: 52.3 kg 52.7 kg 51.8 kg    Examination: General: encephalopathic female on mechanical ventilator HENT: [place]/AT,  perrla Lungs: CTA, reg resp pattern Cardiovascular: ST on telemetry, warm and well perfused. No murmur Abdomen: soft, non tender, non distended Extremities:. Warm and well perfused Neuro: non eye opening, roving side to side, PERRL, no purposeful movement, no movement in extremities    Assessment & Plan:  A/P  Cardiac arrest Bradycardia, Asystole On 11/26 Transient complete heart block Type II NSTEMI -unclear etiology H/O Atrial tachycardia:  -on bb -Diltiazem restarted infusion overnight -also req reinitiation of neo overnight but low dose -ASA per Cards. Statin on hold in setting of elevated LFTs (which is improved)  Shock: RESOVLED -suspect adrenal insufficiency  -vasopressors weaned off 12/1 -wean steroids back to prednisone but higher dose then cont to decrease as tolerated  Acute encephalopathy: -s/p cardiac arrest,  -Neurology following, suspect metabolic encephalopathy -Elevated ammonia: 55->46. Continue lactulose -Will need good dialysis -LP planned for today, empiric meningitis coverage with immunosuppression but lower on differential as such high mortality with untreated meningitis. Shock improved and otherwise does not look infective.   Acute hypoxemic respiratory failure -stable -titrate vent -mental status precluding extubation at this time.   End-stage renal disease secondary to atypical HUS Primary graft failure of deceased donor renal transplant 09/2018 Nephrology following -CyA (stopping 12/3), Valcyte, and stress dose steroids -will d/w nephrology crrt as pt has not had normal dialysis and family indicating they do not want to  cont with current care if pt is not going to improve. Truly need effective dialysis to give any sort of prognosis and provide medial team and family with reassurance encephalopathy is not 2/2 medications or other metabolic derrangements helped with dialysis  Rheumatoid Arthritis on hydroxychloroquine (on hold)  Chronic normocytic anemia:  -likely 2/2 esrd -follow, no acute indication for transfusion  Thrombocytopenia:  -4T Score: 4: intermediate risk -HIT ab negative -resume chemoprophy when plts >100  Best practice:  Diet: Continue tube feeds Pain/Anxiety/Delirium protocol (if indicated): d/c all sedation VAP protocol (if indicated): per protocol DVT prophylaxis: held GI prophylaxis: PPI Glucose control: relatively controlled Foley anuric Mobility: bed rest Code Status: Full Code Family Communication: Jonise Beitler and sister: [phone], 12/3 Disposition:  ICU.    Critical care time: The patient is critically ill with multiple organ systems failure and requires high complexity decision making for assessment and support, frequent evaluation and titration of therapies, application of advanced monitoring technologies and extensive interpretation of multiple databases.  Critical care time 43 mins. This represents my time independent of the NPs time taking care of the pt. This is excluding procedures.    [name] DO [facility] 2019/01/01, 8:25 AM

## 2019-01-05 NOTE — Procedures (Signed)
CLINICAL DATA: Altered mental status, encephalopathy  EXAM:  DIAGNOSTIC LUMBAR PUNCTURE UNDER FLUOROSCOPIC GUIDANCE  FLUOROSCOPY TIME: Fluoroscopy Time:    Radiation Exposure Index (if provided by the fluoroscopic device):    Number of Acquired Spot Images: 0  Fluoroscopy Time:  0 minutes, 12 seconds  Radiation Exposure Index (if provided by the fluoroscopic device):  1.4 mGy  PROCEDURE: I discussed the risks (including hemorrhage, infection, and nerve damage, among others), benefits, and alternatives to fluoroscopically guided lumbar puncture with patient's mother by telephone, as the patient is unable to consent and has altered mental status.  We specifically discussed the high likelihood of technical success of the procedure.  The patient's mother understood and elected for the patient undergo the procedure.   Standard time-out was employed.  Following sterile skin prep and local anesthetic administration consisting of 1 percent lidocaine, a 20 gauge spinal needle was advanced without difficulty into the thecal sac at the L4-5 level under fluoroscopic guidance.  Clear CSF was returned.  Because the patient is intubated, I did not turn her onto her side to obtain a pressure measurement.  A pressure measurement obtained with patient prone demonstrated CSF pressure of 23 cm of water.   A total of 13 cc of clear CSF was collected in 4 vials.  The needle was subsequently removed and the skin cleansed and bandaged.  No immediate complications were observed.   IMPRESSION: Technically successful lumbar puncture at the L4-5 level.  Prone opening pressure was 23 cm of water.  13 cc of clear CSF was sent to the lab for analysis.

## 2019-01-05 NOTE — Progress Notes (Signed)
[facility] Progress Note  Subjective:   Did not tolerate HD yesterday even with PE, was hypotensive and tachycardia worsened  For LP today  Still on low dose PE today  Vitals:   12/22/18 1059 12/22/18 1100 22-Dec-2018 1115 December 22, 2018 1117  BP: 111/60 102/73 98/68   Pulse: (!) 135 67 (!) 130   Resp: (!) 26 (!) 23 (!) 23   Temp:    99.9 F (37.7 C)  TempSrc:    Axillary  SpO2: 100% 100% 100%   Weight:      Height:        Inpatient medications: . aspirin  81 mg Per Tube Daily  . B-complex with vitamin C  1 tablet Per Tube Daily  . chlorhexidine gluconate (MEDLINE KIT)  15 mL Mouth Rinse BID  . Chlorhexidine Gluconate Cloth  6 each Topical Daily  . Chlorhexidine Gluconate Cloth  6 each Topical Q0600  . feeding supplement (PRO-STAT SUGAR FREE 64)  30 mL Per Tube Daily  . insulin aspart  0-6 Units Subcutaneous Q4H  . lactulose  10 g Per Tube Daily  . mouth rinse  15 mL Mouth Rinse 10 times per day  . metoprolol tartrate  50 mg Per Tube Q6H  . mupirocin ointment   Topical Daily  . pantoprazole (PROTONIX) IV  40 mg Intravenous Daily  . prednisoLONE  30 mg Per Tube QAC breakfast  . sulfamethoxazole-trimethoprim  10 mL Per Tube Q M,W,F-2000  . valganciclovir  450 mg Per Tube Q M,W,F-2000   . sodium chloride 10 mL/hr at 12/06/18 1800  . ampicillin (OMNIPEN) IV 2 g (12/22/18 0901)  . cefTRIAXone (ROCEPHIN)  IV 2 g (December 22, 2018 1054)  . diltiazem (CARDIZEM) infusion 15 mg/hr (12-22-18 0900)  . feeding supplement (VITAL 1.5 CAL) 1,000 mL (2018/12/22 0532)  . phenylephrine (NEO-SYNEPHRINE) Adult infusion 20 mcg/min (2018/12/22 0955)   sodium chloride, acetaminophen **OR** acetaminophen, albuterol, docusate, polyethylene glycol    Exam: Intubated, not awake/alert Tachy, regular Coarse bs b/l LUE AV +B/T No LEE No rashes/lesion   Dialysis: [facility] MWF   4h  400/800  2/2 bath  P4  42.5kg  Hep 2000  LUE AVF  M- 225 q2wk, last 11/24  Recent labs: Hb 10.3, ca 10.5, P 4.9     Cxr 11/27 - no edema  Assessment/ Plan: 1. S/p asystolic cardiac arrest / bradycardia: Required 11 minutes of CPR on 11/27/2018. CTA neg for PE.  Seen by cardiology. S/p empiric antibiotics, blood cx's NGF  2. AMS:  Neuro following. Patient is post-arrest on 11/26. EEG neg, MRI w/o changes.  HOpeful for slow recovery. LP 12/3 to eval for meningitis. Family trying to determine next steps.   3.  ESRD:  MWF HD. Had HD in ICU 11/27, HD not tolerated 11/30 or 12/2.  Will transiton to CRRT today for improved metabolic status given #2. All 4K. No heparin. 0 to 64m h/net negative.  CCM to place Temp HD cath. 4.  Hypertension/volume: improved now off PE 5.  Anemia: Hgb 9s, CTM. Not due for ESA. 6.  Metabolic bone disease: Ca and P stable, CTM\ 7. [facility], is getting her CyA and prednisone here.    RRexene Agent 118-Dec-2020 11:37 AM  Iron/TIBC/Ferritin/ %Sat    Component Value Date/Time   IRON 42 07/17/2014 2244   TIBC 290 07/17/2014 2244   FERRITIN 61 07/17/2014 2244   IRONPCTSAT 14 07/17/2014 2244   Recent  Labs  Lab 12/01/18 0217  12/04/18 0833 12/05/18 1136  Dec 13, 2018 0312  NA  --    < > 135  --    < > 135  K  --    < > 3.7  --    < > 3.6  CL  --    < > 99  --    < > 100  CO2  --    < > 22  --    < > 17*  GLUCOSE  --    < > 137*  --    < > 227*  BUN  --    < > 34*  --    < > 95*  CREATININE  --    < > 2.50*  --    < > 4.68*  CALCIUM  --    < > 8.0*  --    < > 7.6*  PHOS  --    < > 1.2*  --   --   --   ALBUMIN  --    < > 2.4* 1.8*  --   --   INR 1.8*  --   --   --   --   --    < > = values in this interval not displayed.   Recent Labs  Lab 12/05/18 1136  AST 118*  ALT 607*  ALKPHOS 89  BILITOT 1.0  PROT 4.3*   Recent Labs  Lab Dec 13, 2018 0312  WBC 5.1  HGB 9.1*  HCT 28.0*  PLT 69*

## 2019-01-05 NOTE — Death Summary Note (Signed)
DEATH SUMMARY   Patient Details  Name: Erin Morgan MRN: [id number] DOB: Oct 07, 1955  Admission/Discharge Information   Admit Date:  12/24/18  Date of Death: Date of Death: Dec 31, 2018  Time of Death: Time of Death: 1254(Per - 05/23/2022, RN)  Length of Stay: 7  Referring Physician: Aretta Nip, MD   Reason(s) for Hospitalization  weakness  Diagnoses  Preliminary cause of death: Septic shock ([place]) Secondary Diagnoses (including complications and co-morbidities):  Principal Problem:   AMS (altered mental status) Active Problems:   ESRD on dialysis [facility])   Essential hypertension   Hyponatremia   Elevated troponin   Macrocytic anemia   GERD (gastroesophageal reflux disease)   Leukocytosis   Tachycardia   Failed kidney transplant   Pressure injury of skin   Shock ([place])   Acute respiratory distress   Heart block AV complete ([place])   Cardiac arrest ([place])   DCM (dilated cardiomyopathy) [facility])   Brief Hospital Course (including significant findings, care, treatment, and services provided and events leading to death)  Erin Morgan is a 64 y.o. year old female who has a past medical history significant of hypertension, end-stage renal disease on hemodialysis (MWF) status post failed her recent renal transplant done at [facility] in September 2020, anemia of chronic disease, chronic diastolic congestive heart failure, chronic hyponatremia presents to emergency department due to altered mental status.  Patient's mom at bedside is the historian.  Patient's mom reports that patient had generalized weakness and fatigue since 1 week.  Reports that patient is more confused and slower than baseline since yesterday.  She had bowel movement in the bed and she was unaware of it.  Due to persistent confusion she brought patient to the emergency department for further evaluation and management.  No history of headache, blurry vision, seizure, loss of consciousness, lightheadedness,  dizziness, head trauma, chest pain, shortness of breath, palpitation, leg swelling, nausea, vomiting, epigastric pain, melena, hematemesis, decreased appetite, COVID-19 exposure, cough, congestion, wheezing, runny nose or sore throat.  Patient lives alone however her mom is staying with her to take care of her.  No history of smoking, alcohol, illicit drug use.  She is compliant with her dialysis appointment.  Her last dialysis was on Tuesday due to Thanksgiving.  As per patient's mom-patient's immunosuppressant  recently changed.  ED Course: Upon arrival to ED: Patient was tachycardic, afebrile with leukocytosis of 13.4.  CT head without contrast negative, chest x-ray shows mild interstitial edema, no pneumonia.  COVID-19 pending.  Initial troponin elevated at 942 trended up to 990.  EDP consulted cardiology and neurology.  MRI brain ordered and is pending.  Cardiology was consulted on December 24, 2022 for elevated troponin and started heparin for NSTEMI with continued ischemic w/u short a cath. Unfortunately on 11/27 pt suffered an asystolic cardiac arrest and received CPR for approximately 11 mins. She remained bradycardic after ROSC and was externally paced and started on dopamine. subseuqently transitioned to epi and pacing was stopped. Her bradycardia resolved and her home diltiazem and bb were held (she has h/o atrial tachycardia).   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 Nephrology was consulted and was able to start dialysis x1 on 11/27. Since that time pt has not been able to tolerate HD 2/2 hypotension or Tachycardia but without any emergent need for dialysis CRRT was not pursued.   EP was consulted on 11/27 as well who attributed her bradycardia/avblock to hypoxia and did not  recommend pacer at that time.   The pt was improving from shock standpoint post arrest and was weaned from pressors. However she was persistently poorly responsive to unresponsive. MRI was ordered on  11/30 and not consistent with anoxic injury. This was done 2/2 exam and family request for better prognostication as the pt has a living will stating that she would not want long term life sustaining support should she have an irreversible or terminal condition. I explained that day to the pt's mother that I could not medically state that her encephalopathy was not reversible without adequate dialysis for drug removal, clearing of metabolic derrangements and determining any catastrophic events in brain.  On 12/1 pt has seizure-like activity and neuro was consulted. No sz acitivty found but mildly elevated ammonia in setting of liver injury from shock liver. Pt was started on lactulose with improvement in ammonia but no improvement in exam. On 12/2 pt decompensated overnight with hypotension req to be placed back on pressors and also tachycardia for which diltiazem was started. On morning of 12/3 lengthy d/w with consultants were had and still without clear etiology for persistent encephalopathy but with family requesting better information for prognostication decision was made to pursue an LP to r/o (unlikely, but possible) meningitis and dialysis catheter for CRRT initiation to ensure metabolic improvement and reassurance that was not contributing to her persistent state.   Family consented to these and LP and Line was placed without issue. As pt was being being repositioned after uneventful line placement she went asystolic. Pt was a limited code per her wishes so no cpr/acls was pursued. Her mother was brought to bedside to be with her.    Pertinent Labs and Studies  Significant Diagnostic Studies Ct Head Wo Contrast  Result Date: 12/01/2018 CLINICAL DATA:  Altered mental status, dilated pupils, new coagulopathy, possible PE a arrest secondary to hypoxia or hypoglycemia EXAM: CT HEAD WITHOUT CONTRAST TECHNIQUE: Contiguous axial images were obtained from the base of the skull through the vertex without  intravenous contrast. COMPARISON:  CT, MR 11/14/2018 FINDINGS: Brain: No evidence of acute infarction, hemorrhage, hydrocephalus, extra-axial collection or mass lesion/mass effect. Symmetric prominence of the ventricles, cisterns and sulci compatible with parenchymal volume loss. Patchy areas of white matter hypoattenuation are most compatible with chronic microvascular angiopathy. Vascular: Atherosclerotic calcification of the carotid siphons and intradural vertebral arteries. No hyperdense vessel. Skull: No calvarial fracture or suspicious osseous lesion. No scalp swelling or hematoma. Sinuses/Orbits: Paranasal sinuses and mastoid air cells are predominantly clear. Included orbital structures are unremarkable. Other: Patient intubated at this time with endotracheal and transesophageal tubes. IMPRESSION: 1. No acute intracranial abnormality. 2. Stable parenchymal atrophy and chronic microvascular angiopathy. Electronically Signed   By: Lovena Le M.D.   On: 12/01/2018 06:56   Ct Head Wo Contrast  Result Date: 11/29/2018 CLINICAL DATA:  Altered level of consciousness.  Confusion EXAM: CT HEAD WITHOUT CONTRAST TECHNIQUE: Contiguous axial images were obtained from the base of the skull through the vertex without intravenous contrast. COMPARISON:  MRI head 09/19/2018 FINDINGS: Brain: Image quality degraded by motion. Negative for acute infarct, hemorrhage, or mass.  No midline shift. Vascular: Negative for hyperdense vessel Skull: Negative Sinuses/Orbits: Negative Other: None IMPRESSION: No acute abnormality.  Mild atrophy. Motion degraded study. Electronically Signed   By: Franchot Gallo M.D.   On: 11/24/2018 10:05   Ct Angio Chest Pe W Or Wo Contrast  Result Date: 12/01/2018 CLINICAL DATA:  Cardiac arrest with concern for pulmonary embolism.  EXAM: CT ANGIOGRAPHY CHEST WITH CONTRAST TECHNIQUE: Multidetector CT imaging of the chest was performed using the standard protocol during bolus administration of  intravenous contrast. Multiplanar CT image reconstructions and MIPs were obtained to evaluate the vascular anatomy. CONTRAST:  16mL OMNIPAQUE IOHEXOL 350 MG/ML SOLN COMPARISON:  CT chest dated July 18, 2017. FINDINGS: Cardiovascular: Contrast injection is sufficient to demonstrate satisfactory opacification of the pulmonary arteries to the segmental level. There is no pulmonary embolus. The main pulmonary artery is normal. There is no CT evidence of acute right heart strain. There are atherosclerotic changes of the visualized thoracic aorta without evidence for an aneurysm. Heart size is enlarged. There is no pericardial effusion. Atherosclerotic changes are noted of the coronary arteries. There is some mild reflux into the IVC. Mediastinum/Nodes: --No mediastinal or hilar lymphadenopathy. --No axillary lymphadenopathy. --No supraclavicular lymphadenopathy. --there is a multinodular goiter. There is a dominant left-sided thyroid nodule measuring approximately 1.5 cm. --the enteric tube courses through the esophagus and terminates below the left hemidiaphragm. The tip is not visualized on this exam. Lungs/Pleura: There is bibasilar airspace disease favored to represent a combination of atelectasis and consolidation, especially on the left. The endotracheal tube terminates above the carina. There is no pneumothorax. There is a moderate right and small left pleural effusion. Upper Abdomen: There is a small amount of free fluid in the upper abdomen. There are innumerable calcifications in the spleen and liver likely related to a prior granulomatous infection. Musculoskeletal: There are bilateral nondisplaced rib fractures likely related to cardiopulmonary resuscitation. There appears to be a fracture involving the inferior sternum that is poorly evaluated secondary to motion artifact at this level. Review of the MIP images confirms the above findings. IMPRESSION: 1. There is no evidence for acute pulmonary embolus. 2.  There is bibasilar airspace disease favored to represent a combination of atelectasis and consolidation. 3. Bilateral nondisplaced rib fractures likely related to cardiopulmonary resuscitation. There appears to be a fracture involving the inferior sternum that is poorly evaluated secondary to motion artifact at this level. No pneumothorax identified on this exam. 4. Bilateral pleural effusions, right greater than left. 5. Cardiomegaly. 6. Multinodular goiter with a dominant left-sided thyroid nodule measuring approximately 1.5 cm. Further evaluation with nonemergent outpatient thyroid ultrasound is recommended if not already performed. 7. Small amount of free fluid in the upper abdomen. Aortic Atherosclerosis (ICD10-I70.0). Electronically Signed   By: Constance Holster M.D.   On: 12/01/2018 23:08   Mr Brain Wo Contrast  Result Date: 12/05/2018 CLINICAL DATA:  Cardiac arrest with anoxic injury. Prolonged altered mental status. EXAM: MRI HEAD WITHOUT CONTRAST TECHNIQUE: Multiplanar, multiecho pulse sequences of the brain and surrounding structures were obtained without intravenous contrast. COMPARISON:  MRI head 11/06/2018 FINDINGS: Brain: Ventricular enlargement and atrophy, unchanged from the prior study. Negative for acute infarct. Minimal chronic white matter changes stable. Negative for hemorrhage or mass. No edema or midline shift. Vascular: Normal arterial flow voids. Skull and upper cervical spine: No focal bony abnormality Sinuses/Orbits: Air-fluid levels in the maxillary sinus bilaterally are new since the prior study. Mild mucosal edema elsewhere in the paranasal sinuses. Bilateral mastoid effusions are new. No orbital lesion. Other: None IMPRESSION: No acute abnormality.  Atrophy and mild chronic white matter changes Interval development of air-fluid levels in both maxillary sinuses. Interval development of bilateral mastoid effusion and mucosal edema throughout the paranasal sinuses. Electronically  Signed   By: Franchot Gallo M.D.   On: 12/05/2018 16:39   Mr Brain Wo Contrast (  neuro Protocol)  Result Date: 11/29/2018 CLINICAL DATA:  Altered level of consciousness (LOC), unexplained. Additional history provided: Generalized weakness and fatigue for 1 week. Confusion. EXAM: MRI HEAD WITHOUT CONTRAST TECHNIQUE: Multiplanar, multiecho pulse sequences of the brain and surrounding structures were obtained without intravenous contrast. COMPARISON:  Head CT 11/17/2018, brain MRI 09/19/2018 FINDINGS: Brain: There is no evidence of acute infarct. No evidence of intracranial mass. No midline shift or extra-axial fluid collection. No chronic intracranial blood products. Mild scattered T2/FLAIR hyperintensity within the cerebral white matter is nonspecific, but consistent with chronic small vessel ischemic disease. Mild generalized parenchymal atrophy. Vascular: Flow voids maintained within the proximal large arterial vessels. Skull and upper cervical spine: No focal marrow lesion. Sinuses/Orbits: Visualized orbits demonstrate no acute abnormality. No significant paranasal sinus disease or mastoid effusion. IMPRESSION: 1. No evidence of acute intracranial abnormality. 2. Mild generalized parenchymal atrophy and chronic small vessel ischemic disease. Electronically Signed   By: Kellie Simmering DO   On: 11/09/2018 18:42   Dg Chest Port 1 View  Result Date: 12/06/2018 CLINICAL DATA:  Acute respiratory failure. EXAM: PORTABLE CHEST 1 VIEW COMPARISON:  One-view chest x-ray 12/01/2018 FINDINGS: Endotracheal tube terminates 5.3 cm above the carina. Heart is enlarged. Asymmetric patchy airspace disease is present at the lung bases, right greater than left. A small effusion is suspected on the right. No significant left-sided airspace disease is present. IMPRESSION: 1. Endotracheal tube terminates 5.3 cm above the carina. 2. Right lower lobe airspace disease and probable small effusion is concerning for infection or  aspiration. Electronically Signed   By: San Morelle M.D.   On: 12/06/2018 07:11   Dg Chest Port 1 View  Result Date: 12/01/2018 CLINICAL DATA:  Status post intubation EXAM: PORTABLE CHEST 1 VIEW COMPARISON:  12/01/2018 FINDINGS: Endotracheal tube is noted 4.6 cm above the carina. Gastric catheter is noted coiled within the stomach. Cardiac shadow is accentuated by the portable technique. The lungs are mildly hyperinflated without focal infiltrate or effusion. No bony abnormality is noted. IMPRESSION: Endotracheal tube and nasogastric catheter in satisfactory position. No acute abnormality noted. Electronically Signed   By: Inez Catalina M.D.   On: 12/01/2018 02:43   Dg Chest Port 1 View  Result Date: 12/01/2018 CLINICAL DATA:  Respiratory distress EXAM: PORTABLE CHEST 1 VIEW COMPARISON:  11/26/2018 at 8:40 a.m. FINDINGS: Mild cardiomegaly. No focal airspace consolidation or pulmonary edema. Diffuse interstitial coarsening. No pleural effusion or pneumothorax. IMPRESSION: No active disease. Electronically Signed   By: Ulyses Jarred M.D.   On: 12/01/2018 00:24   Dg Chest Port 1 View  Result Date: 11/17/2018 CLINICAL DATA:  Altered mental status EXAM: PORTABLE CHEST 1 VIEW COMPARISON:  10/30/2018 FINDINGS: Normal mediastinum and cardiac silhouette. Normal pulmonary vasculature. Fine linear interstitial pattern noted primarily in the RIGHT upper lobe. No pneumothorax. No evidence of effusion, infiltrate, or pneumothorax. No acute bony abnormality. IMPRESSION: 1. No acute cardiopulmonary process. 2. Probable mild interstitial edema. 3. No overt pulmonary edema.  No evidence pneumonia. Electronically Signed   By: Suzy Bouchard M.D.   On: 11/12/2018 09:02   Dg Fl Guided Lumbar Puncture  Result Date: Dec 08, 2018 CLINICAL DATA:  Altered mental status, encephalopathy EXAM: DIAGNOSTIC LUMBAR PUNCTURE UNDER FLUOROSCOPIC GUIDANCE FLUOROSCOPY TIME:  Fluoroscopy Time:  0 minutes, 12 seconds Radiation  Exposure Index (if provided by the fluoroscopic device): 1.4 mGy PROCEDURE: I discussed the risks (including hemorrhage, infection, and nerve damage, among others), benefits, and alternatives to fluoroscopically guided lumbar puncture with patient's mother  by telephone, as the patient is unable to consent and has altered mental status. We specifically discussed the high likelihood of technical success of the procedure. The patient's mother understood and elected for the patient undergo the procedure. Standard time-out was employed. Following sterile skin prep and local anesthetic administration consisting of 1 percent lidocaine, a 20 gauge spinal needle was advanced without difficulty into the thecal sac at the L4-5 level under fluoroscopic guidance. Clear CSF was returned. Because the patient is intubated, I did not turn her onto her side to obtain a pressure measurement. A pressure measurement obtained with patient prone demonstrated CSF pressure of 23 cm of water. A total of 13 cc of clear CSF was collected in 4 vials. The needle was subsequently removed and the skin cleansed and bandaged. No immediate complications were observed. IMPRESSION: 1. Technically successful lumbar puncture at the L4-5 level. Prone opening pressure was 23 cm of water. 13 cc of clear CSF was sent to the lab for analysis. Electronically Signed   By: Van Clines M.D.   On: 12-27-2018 11:22    Microbiology Recent Results (from the past 240 hour(s))  SARS CORONAVIRUS 2 (TAT 6-24 HRS) Nasopharyngeal Nasopharyngeal Swab     Status: None   Collection Time: 11/14/2018  3:39 PM   Specimen: Nasopharyngeal Swab  Result Value Ref Range Status   SARS Coronavirus 2 NEGATIVE NEGATIVE Final    Comment: (NOTE) SARS-CoV-2 target nucleic acids are NOT DETECTED. The SARS-CoV-2 RNA is generally detectable in upper and lower respiratory specimens during the acute phase of infection. Negative results do not preclude SARS-CoV-2 infection, do  not rule out co-infections with other pathogens, and should not be used as the sole basis for treatment or other patient management decisions. Negative results must be combined with clinical observations, patient history, and epidemiological information. The expected result is Negative. Fact Sheet for Patients: SugarRoll.be Fact Sheet for Healthcare Providers: https://www.woods-mathews.com/ This test is not yet approved or cleared by the [place] FDA and  has been authorized for detection and/or diagnosis of SARS-CoV-2 by FDA under an Emergency Use Authorization (EUA). This EUA will remain  in effect (meaning this test can be used) for the duration of the COVID-19 declaration under Section 56 4(b)(1) of the Act, 21 U.S.C. section 360bbb-3(b)(1), unless the authorization is terminated or revoked sooner. Performed at [facility], [place] [address], [place], [place] [address]   Culture, blood (routine x 2)     Status: None   Collection Time: 11/27/2018  4:45 PM   Specimen: BLOOD RIGHT FOREARM  Result Value Ref Range Status   Specimen Description BLOOD RIGHT FOREARM  Final   Special Requests   Final    BOTTLES DRAWN AEROBIC AND ANAEROBIC Blood Culture results may not be optimal due to an inadequate volume of blood received in culture bottles   Culture   Final    NO GROWTH 5 DAYS Performed at [facility], [place] [address] [place], [place] [address]    Report Status 12/05/2018 FINAL  Final  Culture, blood (routine x 2)     Status: None   Collection Time: 12/01/18  1:06 AM   Specimen: BLOOD  Result Value Ref Range Status   Specimen Description BLOOD SITE NOT SPECIFIED  Final   Special Requests   Final    BOTTLES DRAWN AEROBIC ONLY Blood Culture results may not be optimal due to an inadequate volume of blood received in culture bottles   Culture  Final    NO GROWTH 5 DAYS Performed at [facility], [place] [address]  [place], [place] [address]    Report Status 12/06/2018 FINAL  Final  MRSA PCR Screening     Status: None   Collection Time: 12/01/18  3:50 AM   Specimen: Nasal Mucosa; Nasopharyngeal  Result Value Ref Range Status   MRSA by PCR NEGATIVE NEGATIVE Final    Comment:        The GeneXpert MRSA Assay (FDA approved for NASAL specimens only), is one component of a comprehensive MRSA colonization surveillance program. It is not intended to diagnose MRSA infection nor to guide or monitor treatment for MRSA infections. Performed at [facility], [place] [address] [place], [place] [address]   CSF culture     Status: None (Preliminary result)   Collection Time: 2018-12-21 10:48 AM   Specimen: PATH Cytology CSF; Cerebrospinal Fluid  Result Value Ref Range Status   Specimen Description CSF  Final   Special Requests NONE  Final   Gram Stain   Final    WBC PRESENT, PREDOMINANTLY MONONUCLEAR NO ORGANISMS SEEN CYTOSPIN SMEAR Performed at [facility], [place] [address], [place],  [address]    Culture PENDING  Incomplete   Report Status PENDING  Incomplete    Lab Basic Metabolic Panel: Recent Labs  Lab 12/01/18 1230 12/01/18 1615 12/02/18 0339 12/02/18 1603 12/03/18 0630 12/04/18 0833 12/06/18 0300 12/06/18 2328 12/21/2018 0312  NA  --  136  --   --  135 135 137 135 135  K  --  3.7  --   --  3.6 3.7 3.8 3.6 3.6  CL  --  96*  --   --  97* 99 103 104 100  CO2  --  23  --   --  22 22 16* 16* 17*  GLUCOSE  --  71  --   --  152* 137* 183* 178* 227*  BUN  --  9  --   --  44* 34* 100* 91* 95*  CREATININE  --  1.90*  --   --  3.59* 2.50* 5.14* 4.60* 4.68*  CALCIUM  --  8.0*  --   --  8.2* 8.0* 8.0* 7.4* 7.6*  MG 1.8 1.7 3.1* 3.1*  --   --   --  2.8*  --   PHOS 4.7* 3.7 3.9 4.2  --  1.2*  --   --   --    Liver Function Tests: Recent Labs  Lab 12/01/18 0106 12/01/18 0304 12/01/18 0810 12/04/18 0833 12/05/18 1136  AST 985* 1,664* 2,761*  --  118*  ALT 591* 962* 1,386*  --   607*  ALKPHOS 90 73 83  --  89  BILITOT 1.1 1.5* 2.4*  --  1.0  PROT 5.2* 4.6* 4.9*  --  4.3*  ALBUMIN 2.5* 2.4* 2.4* 2.4* 1.8*   No results for input(s): LIPASE, AMYLASE in the last 168 hours. Recent Labs  Lab 12/01/18 0106 12/05/18 1801 12/06/18 0300  AMMONIA 140* 55* 46*   CBC: Recent Labs  Lab 12/03/18 0630 12/04/18 0420 12/05/18 0439 12/06/18 0300 2018-12-21 0312  WBC 8.7 7.6 6.2 4.1 5.1  HGB 8.3* 9.1* 9.2* 9.8* 9.1*  HCT 25.8* 28.9* 28.7* 30.5* 28.0*  MCV 98.5 99.7 98.0 98.7 99.3  PLT 125* 118* 95* 85* 69*   Cardiac Enzymes: No results for input(s): CKTOTAL, CKMB, CKMBINDEX, TROPONINI in the last 168 hours. Sepsis Labs: Recent Labs  Lab 12/01/18 0800 12/01/18 1615  12/04/18 0420 12/05/18 0439 12/06/18 0300 12-23-2018 0312  WBC  --   --    < > 7.6 6.2 4.1 5.1  LATICACIDVEN >11.0* 4.0*  --   --   --   --   --    < > = values in this interval not displayed.    Procedures/Operations     [name] 12-23-18, 3:36 PM

## 2019-01-05 NOTE — Procedures (Addendum)
Hemodialysis Catheter Insertion Procedure Note Erin Morgan [id number] Mar 29, 1955  Procedure: Insertion of Hemodialysis Catheter Indications: Dialysis Access   Procedure Details Consent: Risks of procedure as well as the alternatives and risks of each were explained to the (patient/caregiver).  Consent for procedure obtained. Time Out: Verified patient identification, verified procedure, site/side was marked, verified correct patient position, special equipment/implants available, medications/allergies/relevent history reviewed, required imaging and test results available.  Performed  Maximum sterile technique was used including antiseptics, cap, gloves, gown, hand hygiene, mask and sheet. Skin prep: Chlorhexidine; local anesthetic administered Triple lumen hemodialysis catheter was inserted into right internal jugular vein using the Seldinger technique.  Evaluation Blood flow good Complications: No apparent complications Patient did tolerate procedure well. Chest X-ray ordered to verify placement.  CXR: pending.   Line was done under u/s guidance. Easily compressible vein noted with neighboring pulsatile artery. Upon stick dark red non pulsatile blood was noted. Wire easily advanced. Wire was verified to be in easily compressible/non pulsatile vessel in both cross sectional and longitudinal views under ultrasound guidance. Skin was easily dilated and catheter advanced without issue. No ectopy noted on telemetry. Wire was withdrawn from vessel. No air was aspirated thru entirety of the procedure. Pt tolerated well. No complications were appreciated.   [name] 2018-12-29

## 2019-01-05 NOTE — Progress Notes (Signed)
[facility] Progress Note Patient Name: Erin Morgan DOB: 1955/08/06 MRN: [id number]   Date of Service  2018/12/27  HPI/Events of Note  Remains with elevated HR - Seen by cardiology who felt that she atrial tacycardia which has been treated with Metoprolol. However, rate is irregularly, irregular tonight. No real response to Cardizem IV infusion. Troponin = 6,323 which is decreased from previous level of 11,210 post arrest. This was not felt to be ACS related d/t clean cath earlier this year and complicated by ESRD and s/p cardiac arrest/CPR. INR = 1.8 d/t liver dysfunction.   eICU Interventions  Plan: 1. Bedside nurse to contact cardiology for their thoughts regarding cardiac management of this complex patient.      Intervention Category Major Interventions: Arrhythmia - evaluation and management  Sommer,Steven Eugene 12/27/18, 1:04 AM

## 2019-01-05 NOTE — Progress Notes (Signed)
RT NOTES: Patient transported for lumbar puncture on vent and back to room 3M05 without incident.

## 2019-01-05 NOTE — Progress Notes (Signed)
Progress Note  Patient Name: Erin Morgan Date of Encounter: Dec 27, 2018  Primary Cardiologist: Mertie Moores, MD   Subjective   Minimal response to strong sternal rub and pinch, over-breaths the vent at times but no purposeful movements.  Inpatient Medications    Scheduled Meds: . aspirin  81 mg Per Tube Daily  . B-complex with vitamin C  1 tablet Per Tube Daily  . chlorhexidine gluconate (MEDLINE KIT)  15 mL Mouth Rinse BID  . Chlorhexidine Gluconate Cloth  6 each Topical Daily  . Chlorhexidine Gluconate Cloth  6 each Topical Q0600  . cycloSPORINE  75 mg Per Tube BID  . feeding supplement (PRO-STAT SUGAR FREE 64)  30 mL Per Tube Daily  . hydrocortisone sod succinate (SOLU-CORTEF) inj  100 mg Intravenous Q8H  . insulin aspart  0-6 Units Subcutaneous Q4H  . lactulose  10 g Per Tube Daily  . mouth rinse  15 mL Mouth Rinse 10 times per day  . metoprolol tartrate  50 mg Per Tube Q6H  . mupirocin ointment   Topical Daily  . pantoprazole (PROTONIX) IV  40 mg Intravenous Daily  . sulfamethoxazole-trimethoprim  10 mL Per Tube Q M,W,F-2000  . valganciclovir  450 mg Per Tube Q M,W,F-2000   Continuous Infusions: . sodium chloride 10 mL/hr at 12/06/18 1800  . dexmedetomidine (PRECEDEX) IV infusion Stopped (12/05/18 1007)  . diltiazem (CARDIZEM) infusion 15 mg/hr (12/27/2018 0050)  . feeding supplement (VITAL 1.5 CAL) 1,000 mL (2018-12-27 0532)  . phenylephrine (NEO-SYNEPHRINE) Adult infusion 20 mcg/min (12/27/2018 0700)   PRN Meds: sodium chloride, acetaminophen **OR** acetaminophen, albuterol, docusate, fentaNYL (SUBLIMAZE) injection, polyethylene glycol   Vital Signs    Vitals:   2018-12-27 0500 12/27/18 0600 12-27-2018 0700 12-27-2018 0729  BP: 91/67 98/74 96/73 (!) 101/56  Pulse: (!) 138 (!) 135 (!) 138 (!) 134  Resp: (!) 26 (!) 25 (!) 25 (!) 24  Temp:      TempSrc:      SpO2: 100% 100% 100% 100%  Weight:      Height:        Intake/Output Summary (Last 24 hours) at Dec 27, 2018  0744 Last data filed at 2018-12-27 0700 Gross per 24 hour  Intake 1362.08 ml  Output 259 ml  Net 1103.08 ml   Last 3 Weights 2018/12/27 12/06/2018 12/06/2018  Weight (lbs) 114 lb 3.2 oz 116 lb 2.9 oz 115 lb 4.8 oz  Weight (kg) 51.8 kg 52.7 kg 52.3 kg  Some encounter information is confidential and restricted. Go to Review Flowsheets activity to see all data.      Telemetry    HR reported 170s during HD last pm, stopped, has maintained HR 120s-150s overnight, ?atrial tach or Afib - Personally Reviewed    ECG    12/3 ?Atrial tach w/ HR 138, LBBB, rhythm is regular, no P waves seen, +artifact 12/1 Sinus tach, HR 131 with 1st degree AV block & LBBB - Personally Reviewed  Physical Exam   General: Well developed, well nourished, female responds only to pain, on the vent Head: Eyes PERRLA, Head normocephalic and atraumatic Lungs: essentially clear bilaterally to auscultation. Heart: HRRR S1 S2, without rub or gallop. No murmur. Extremity pulses are difficult to assess as is JVD due to lines and equipment, cap refill slightly delayed Abdomen: Bowel sounds are decreased, abdomen soft and non-tender without masses or  hernias noted. Msk: not able to test Extremities: No clubbing, cyanosis; +upper extremity edema.    Skin:  No rashes or  lesions noted. Neuro: responds to deep pain w/ non-directed movement   Labs    High Sensitivity Troponin:   Recent Labs  Lab 12/01/18 1615 12/03/18 0630 12/03/18 0951 12/06/18 2328 Dec 16, 2018 0111  TROPONINIHS 7,503* 12,203* 11,210* 6,323* 6,190*      Chemistry Recent Labs  Lab 12/01/18 0304  12/01/18 0810  12/04/18 0833 12/05/18 1136 12/06/18 0300 12/06/18 2328 2018/12/16 0312  NA 132*   < > 134*   < > 135  --  137 135 135  K 5.5*   < > 5.7*   < > 3.7  --  3.8 3.6 3.6  CL 90*  --  91*   < > 99  --  103 104 100  CO2 11*  --  8*   < > 22  --  16* 16* 17*  GLUCOSE 179*  --  117*   < > 137*  --  183* 178* 227*  BUN 42*  --  44*   < > 34*  --   100* 91* 95*  CREATININE 4.95*  --  5.15*   < > 2.50*  --  5.14* 4.60* 4.68*  CALCIUM 8.5*  --  8.8*   < > 8.0*  --  8.0* 7.4* 7.6*  PROT 4.6*  --  4.9*  --   --  4.3*  --   --   --   ALBUMIN 2.4*  --  2.4*  --  2.4* 1.8*  --   --   --   AST 1,664*  --  2,761*  --   --  118*  --   --   --   ALT 962*  --  1,386*  --   --  607*  --   --   --   ALKPHOS 73  --  83  --   --  89  --   --   --   BILITOT 1.5*  --  2.4*  --   --  1.0  --   --   --   GFRNONAA 9*  --  8*   < > 20*  --  8* 9* 9*  GFRAA 10*  --  10*   < > 23*  --  10* 11* 11*  ANIONGAP 31*  --  35*   < > 14  --  18* 15 18*   < > = values in this interval not displayed.     Hematology Recent Labs  Lab 12/05/18 0439 12/06/18 0300 2018-12-16 0312  WBC 6.2 4.1 5.1  RBC 2.93* 3.09* 2.82*  HGB 9.2* 9.8* 9.1*  HCT 28.7* 30.5* 28.0*  MCV 98.0 98.7 99.3  MCH 31.4 31.7 32.3  MCHC 32.1 32.1 32.5  RDW 21.2* 21.8* 22.4*  PLT 95* 85* 69*    BNPNo results for input(s): BNP, PROBNP in the last 168 hours.   DDimer  Recent Labs  Lab 12/01/18 1230  DDIMER >20.00*     Radiology    Mr Brain Wo Contrast  Result Date: 12/05/2018 CLINICAL DATA:  Cardiac arrest with anoxic injury. Prolonged altered mental status. EXAM: MRI HEAD WITHOUT CONTRAST TECHNIQUE: Multiplanar, multiecho pulse sequences of the brain and surrounding structures were obtained without intravenous contrast. COMPARISON:  MRI head 11/08/2018 FINDINGS: Brain: Ventricular enlargement and atrophy, unchanged from the prior study. Negative for acute infarct. Minimal chronic white matter changes stable. Negative for hemorrhage or mass. No edema or midline shift. Vascular: Normal arterial flow voids. Skull and upper cervical spine: No focal  bony abnormality Sinuses/Orbits: Air-fluid levels in the maxillary sinus bilaterally are new since the prior study. Mild mucosal edema elsewhere in the paranasal sinuses. Bilateral mastoid effusions are new. No orbital lesion. Other: None  IMPRESSION: No acute abnormality.  Atrophy and mild chronic white matter changes Interval development of air-fluid levels in both maxillary sinuses. Interval development of bilateral mastoid effusion and mucosal edema throughout the paranasal sinuses. Electronically Signed   By: Franchot Gallo M.D.   On: 12/05/2018 16:39   Dg Chest Port 1 View  Result Date: 12/06/2018 CLINICAL DATA:  Acute respiratory failure. EXAM: PORTABLE CHEST 1 VIEW COMPARISON:  One-view chest x-ray 12/01/2018 FINDINGS: Endotracheal tube terminates 5.3 cm above the carina. Heart is enlarged. Asymmetric patchy airspace disease is present at the lung bases, right greater than left. A small effusion is suspected on the right. No significant left-sided airspace disease is present. IMPRESSION: 1. Endotracheal tube terminates 5.3 cm above the carina. 2. Right lower lobe airspace disease and probable small effusion is concerning for infection or aspiration. Electronically Signed   By: San Morelle M.D.   On: 12/06/2018 07:11    Cardiac Studies   Echo 12/01/18   1. Left ventricular ejection fraction, by visual estimation, is 45 to 50%. The left ventricle has mildly decreased function. There is moderately increased left ventricular hypertrophy.  2. Left ventricular diastolic parameters are consistent with Grade III diastolic dysfunction (restrictive).  3. Global right ventricle has normal systolic function.The right ventricular size is normal.  4. Left atrial size was moderately dilated.  5. Right atrial size was normal.  6. Moderate mitral annular calcification.  7. The mitral valve is normal in structure. Trace mitral valve regurgitation. No evidence of mitral stenosis.  8. The tricuspid valve is normal in structure. Tricuspid valve regurgitation is mild.  9. The aortic valve is tricuspid. Aortic valve regurgitation is not visualized. Mild aortic valve stenosis. 10. The pulmonic valve was normal in structure. Pulmonic  valve regurgitation is trivial. 11. Mildly elevated pulmonary artery systolic pressure. 12. The inferior vena cava is dilated in size with >50% respiratory variability, suggesting right atrial pressure of 8 mmHg. 13. Mild global reduction in LV systolic function; restrictive filling; moderate LVH; myocardium with speckled appearance (consider amyloid); mild AS (mean gradient 14 mmHg); moderate LAE; mild TR.  Patient Profile     64 y.o. female with complex course, presented with altered mental status, course complicated by hypoxic cardiac arrest. Has history of ESRD on HD, rheumatoid arthritis, ectopic atrial tachycardia.  Assessment & Plan    1. Atrial tach - HR up to the 170s last pm w/ SBP 80s-90s during HD>>treatment terminated - on Cardizem IV 15 mg/hr, but also requires Neo at 20 mcg/min - metop 50 mg not given this am due to low BP - Rhythm may be afib at times - no bradycardia, no sig pauses - HR increased when HD attempted, has not come down since - Cardizem does not seem to be helping HR much - she got 3 doses metoprolol 25 mg yesterday - BP will not tolerate Cardizem and metoprolol - discuss esmolol w/ MD, may be better to have an infusion, BB better than CCB w/ LVD - no amio w/ abnl LFTs  2. Demand ischemia - cath 05/2018 w/ nl cors - peak trop 12,203 in setting of cardiac arrest and hypoxia  3. LVD - EF 45-50% in setting of demand ischemia, HR stress from tachycardia and acute illness - volume mgt w/ HD when  possible  4. AMS - unclear cause - no Precedex or fentanyl since 12/01 - CCM MD is considering spinal tap to r/o infection in immunocompromised pt, may do tomorrow if no improvement  Otherwise, per CCM/IM Principal Problem:   AMS (altered mental status) Active Problems:   ESRD on dialysis [facility])   Essential hypertension   Hyponatremia   Elevated troponin   Macrocytic anemia   GERD (gastroesophageal reflux disease)   Leukocytosis   Tachycardia   Failed  kidney transplant   Pressure injury of skin   Shock ([place])   Acute respiratory distress   Heart block AV complete ([place])   Cardiac arrest ([place])   DCM (dilated cardiomyopathy) ([place])  For questions or updates, please contact [facility] Please consult [email or web address] for contact info under     Signed, Rosaria Ferries, PA-C  12-12-18, 7:44 AM

## 2019-01-05 NOTE — Progress Notes (Signed)
Neurology Progress Note   S:// Seen and examined Unable to get full effective dialysis yesterday. Nephro and cards following   O:// Current vital signs: BP (!) 101/56   Pulse (!) 134   Temp 99.1 F (37.3 C) (Oral)   Resp (!) 24   Ht _0  (1.626 m)   Wt 51.8 kg   SpO2 100%   BMI 19.60 kg/m  Vital signs in last 24 hours: Temp:  [97.6 F (36.4 C)-100.3 F (37.9 C)] 99.1 F (37.3 C) (12/03 0800) Pulse Rate:  [27-177] 134 (12/03 0729) Resp:  [18-33] 24 (12/03 0729) BP: (75-143)/(56-93) 101/56 (12/03 0729) SpO2:  [84 %-100 %] 100 % (12/03 0729) Arterial Line BP: (94-155)/(52-84) 99/58 (12/03 0700) FiO2 (%):  [40 %-50 %] 40 % (12/03 0729) Weight:  [51.8 kg-52.7 kg] 51.8 kg (12/03 0306) Gen: intubated, no sedation HEENT: [place] AT  CVS: RRR Resp: vented Ext: warm well perfused, no edema on legs. Right arm in compression bandage NEUROLOGICAL Intubated, no sedation - has been off sedation for >24h No spontaneous movement seen Not following commands Opens eyes to noxious stimulation, on very strong sternal rub. CN: PERRL, no forced gaze, facial symmetry difficult to ascertain Motor: no mov to nox stim Sensory: as above Coord and gait - can not be ascertained   Medications  Current Facility-Administered Medications:  .  0.9 %  sodium chloride infusion, , Intravenous, PRN, Tally Due, MD, Last Rate: 10 mL/hr at 12/06/18 1800 .  acetaminophen (TYLENOL) tablet 650 mg, 650 mg, Per Tube, Q6H PRN **OR** acetaminophen (TYLENOL) suppository 650 mg, 650 mg, Rectal, Q6H PRN, [name], DO .  albuterol (PROVENTIL) (2.5 MG/3ML) 0.083% nebulizer solution 2.5 mg, 2.5 mg, Nebulization, Q2H PRN, Tally Due, MD .  ampicillin (OMNIPEN) 2 g in sodium chloride 0.9 % 100 mL IVPB, 2 g, Intravenous, Q24H, Bertis Ruddy, RPH .  aspirin chewable tablet 81 mg, 81 mg, Per Tube, Daily, Pahwani, Rinka R, MD, 81 mg at 12/06/18 1026 .  B-complex with vitamin C tablet 1 tablet, 1 tablet,  Per Tube, Daily, Spero Geralds, MD, 1 tablet at 12/06/18 1026 .  cefTRIAXone (ROCEPHIN) 2 g in sodium chloride 0.9 % 100 mL IVPB, 2 g, Intravenous, Q12H, Bertis Ruddy, RPH .  chlorhexidine gluconate (MEDLINE KIT) (PERIDEX) 0.12 % solution 15 mL, 15 mL, Mouth Rinse, BID, Spero Geralds, MD, 15 mL at 12/06/18 1956 .  Chlorhexidine Gluconate Cloth 2 % PADS 6 each, 6 each, Topical, Daily, Pahwani, Rinka R, MD, 6 each at 12/06/18 0830 .  Chlorhexidine Gluconate Cloth 2 % PADS 6 each, 6 each, Topical, Q0600, Roney Jaffe, MD, 6 each at 12/06/18 0316 .  cycloSPORINE (NEORAL) 100 MG/ML microemulsion solution 75 mg, 75 mg, Per Tube, BID, Spero Geralds, MD, 75 mg at 12/06/18 2103 .  dexmedetomidine (PRECEDEX) 400 MCG/100ML (4 mcg/mL) infusion, 0.4-1.2 mcg/kg/hr, Intravenous, Titrated, Spero Geralds, MD, Stopped at 12/05/18 1007 .  diltiazem (CARDIZEM) 125 mg in dextrose 5% 125 mL (1 mg/mL) infusion, 5-15 mg/hr, Intravenous, Titrated, Anders Simmonds, MD, Last Rate: 15 mL/hr at 2019/01/05 0050, 15 mg/hr at 2019-01-05 0050 .  docusate (COLACE) 50 MG/5ML liquid 100 mg, 100 mg, Per Tube, Daily PRN, [name], DO .  feeding supplement (PRO-STAT SUGAR FREE 64) liquid 30 mL, 30 mL, Per Tube, Daily, Spero Geralds, MD, 30 mL at 12/06/18 1026 .  feeding supplement (VITAL 1.5 CAL) liquid 1,000 mL, 1,000 mL, Per Tube, Continuous, Spero Geralds, MD, Last Rate: 40  mL/hr at 14-Dec-2018 0532, 1,000 mL at 2018/12/14 0532 .  fentaNYL (SUBLIMAZE) injection 50 mcg, 50 mcg, Intravenous, Q1H PRN, Tally Due, MD, 50 mcg at 12/05/18 0459 .  hydrocortisone sodium succinate (SOLU-CORTEF) 100 MG injection 100 mg, 100 mg, Intravenous, Q8H, Marshall, Jessica, DO, 100 mg at December 14, 2018 0318 .  insulin aspart (novoLOG) injection 0-6 Units, 0-6 Units, Subcutaneous, Q4H, Hicks, Samantha B, NP, 2 Units at 12-14-2018 0322 .  lactulose (CHRONULAC) 10 GM/15ML solution 10 g, 10 g, Per Tube, Daily, Rigoberto Noel, MD, 10 g at 12/06/18  1026 .  MEDLINE mouth rinse, 15 mL, Mouth Rinse, 10 times per day, Spero Geralds, MD, 15 mL at Dec 14, 2018 0508 .  metoprolol tartrate (LOPRESSOR) 25 mg/10 mL oral suspension 50 mg, 50 mg, Per Tube, Q6H, Marykay Lex, MD, Stopped at 2018/12/14 [phone]  mupirocin ointment (BACTROBAN) 2 %, , Topical, Daily, [name], DO .  pantoprazole (PROTONIX) injection 40 mg, 40 mg, Intravenous, Daily, Tally Due, MD, 40 mg at 12/06/18 1026 .  phenylephrine (NEOSYNEPHRINE) 10-0.9 MG/250ML-% infusion, 0-400 mcg/min, Intravenous, Titrated, Bowser, Grace E, NP, Last Rate: 30 mL/hr at 14-Dec-2018 0700, 20 mcg/min at 14-Dec-2018 0700 .  polyethylene glycol (MIRALAX / GLYCOLAX) packet 17 g, 17 g, Per Tube, Daily PRN, [name], DO .  sulfamethoxazole-trimethoprim (BACTRIM) 200-40 MG/5ML suspension 10 mL, 10 mL, Per Tube, Q M,W,F-2000, Spero Geralds, MD, 10 mL at 12/06/18 2004 .  valganciclovir (VALCYTE) 50 MG/ML oral solution 450 mg, 450 mg, Per Tube, Q M,W,F-2000, [name], DO, 450 mg at 12/06/18 2004 .  vancomycin (VANCOCIN) IVPB 1000 mg/200 mL premix, 1,000 mg, Intravenous, Once, Bertis Ruddy, [facility]  Labs CBC    Component Value Date/Time   WBC 5.1 14-Dec-2018 0312   RBC 2.82 (L) 14-Dec-2018 0312   HGB 9.1 (L) 12/14/2018 0312   HCT 28.0 (L) 14-Dec-2018 0312   HCT 12.5 (LL) 11/29/2018 1645   PLT 69 (L) December 14, 2018 0312   MCV 99.3 12-14-18 0312   MCH 32.3 December 14, 2018 0312   MCHC 32.5 2018/12/14 0312   RDW 22.4 (H) Dec 14, 2018 0312   LYMPHSABS 0.2 (L) 11/23/2018 0917   MONOABS 1.2 (H) 11/20/2018 0917   EOSABS 0.0 11/26/2018 0917   BASOSABS 0.0 11/21/2018 0917   CMP     Component Value Date/Time   NA 135 Dec 14, 2018 0312   K 3.6 2018-12-14 0312   CL 100 12/14/18 0312   CO2 17 (L) Dec 14, 2018 0312   GLUCOSE 227 (H) 2018-12-14 0312   BUN 95 (H) 12/14/18 0312   CREATININE 4.68 (H) 12/14/18 0312   CALCIUM 7.6 (L) 12-14-2018 0312   PROT 4.3 (L) 12/05/2018 1136   ALBUMIN 1.8 (L)  12/05/2018 1136   AST 118 (H) 12/05/2018 1136   ALT 607 (H) 12/05/2018 1136   ALKPHOS 89 12/05/2018 1136   BILITOT 1.0 12/05/2018 1136   GFRNONAA 9 (L) December 14, 2018 0312   GFRAA 11 (L) 12-14-2018 0312   Imaging I have reviewed images in epic and the results pertinent to this consultation are: MRI examination of the brain-no evidence of anoxic damage  Assessment: 63/F with ESRD, failed renal transplant, on immunesupression, admitted with confusion, had a cardiac arrest and has not been waking up since then. Has had multiple metabolic derangements, which are being managed by PCCM/Nephrology. Continues to remain encephalopathic. LFTs were severely deranged and are improving. Renal function is being managed by nephrology, did not get full HD session yesterday.  This could be a case of prolonged  encephalopathy, secondary to multiple toxic metabolic derangements but given immunosuppression - it might be prudent to look for a CNS source of infection, which could be contributing to current management.  EEG neg for seizures MRI with no evidence of stroke or anoxic injury.  Impression: -Toxic metabolic encephalopathy  -?CNS infection - less likely but might be a contributory factor -Some component of HIE, not bad enough to appear on MRI as MRI would only pick significant damage, but with her comorbidities, any damage, even not significant to be picked on MRI, could still be clinically significant  Recommendations: -Continue to fix toxic metabolic derangements - dialysis per nephrology -Consider a LP - Dr. Ruthann Cancer will contact family to get a sense of how aggressive they would like to be in treatment, as family has elucidated that she would not want aggressive care - no paperwork to reflect that available. That said, if this were truly a bacterial meningitis, it is less likely that she would have survived for this long time. If LP is done, check fungal cultures in addition to Cell count, glucose,  protein, gram stain and culture -In the interim, cover with Vanc/Cef/ampi to cover bacterial meningitis incl Listeria coverage (less likely HSV - MRI with no temporal lobe involvement).  Discussed with Dr. Ruthann Cancer and Derry Lory RPh  -- Amie Portland, MD [facility] Pager: [phone] If 7pm to 7am, please call on call as listed on AMION.  CRITICAL CARE ATTESTATION Performed by: Amie Portland, MD Total critical care time: 32 minutes Critical care time was exclusive of separately billable procedures and treating other patients and/or supervising APPs/Residents/Students Critical care was necessary to treat or prevent imminent or life-threatening deterioration due to toxic metabolic encephalopathy This patient is critically ill and at significant risk for neurological worsening and/or death and care requires constant monitoring. Critical care was time spent personally by me on the following activities: development of treatment plan with patient and/or surrogate as well as nursing, discussions with consultants, evaluation of patient's response to treatment, examination of patient, obtaining history from patient or surrogate, ordering and performing treatments and interventions, ordering and review of laboratory studies, ordering and review of radiographic studies, pulse oximetry, re-evaluation of patient's condition, participation in multidisciplinary rounds and medical decision making of high complexity in the care of this patient.

## 2019-01-05 NOTE — Progress Notes (Signed)
Chaplain responded to a page for end-of life care.  Brion Aliment Chaplain Resident For questions concerning this note please contact me by pager [phone]

## 2019-01-05 NOTE — Progress Notes (Signed)
Nutrition Follow-up  DOCUMENTATION CODES:   Underweight  INTERVENTION:   Tube Feeding:  Continue Vital 1.5 at 40 ml/hr Pro-Stat 30 mL daily Provides 1540 kcals, 80 g of protein and 730 mL of free water  Continue B-complex with C  NUTRITION DIAGNOSIS:   Severe Malnutrition related to chronic illness(ESRD on HD, failed renal transplant, CHF) as evidenced by severe fat depletion, severe muscle depletion.  Being addressed via TF   GOAL:   Patient will meet greater than or equal to 90% of their needs  Progressing   MONITOR:   Vent status, Skin, TF tolerance, Weight trends, Labs  REASON FOR ASSESSMENT:   Ventilator, Consult Enteral/tube feeding initiation and management  ASSESSMENT:   64 yo female admitted with AMS with hx of generalized weakness and fatigue x 1 week, cardiac arrest on 11/27 requiring intubation. PMH includes ESRD on HD, s/p failed renal transplant 09/2018, CHF  11/26 Admit 11/27 Cardiac arrest, Intubated 12/03 LP today to evaluate for meningitis  Patient is currently intubated on ventilator support, requiring neosynephrine MV: 10.6 L/min Temp (24hrs), Avg:99.2 F (37.3 C), Min:97.6 F (36.4 C), Max:100.3 F (37.9 C)  Pt did not tolerate HD yesterday; possible CRRT initiation  Tolerating Vital 1.5 at 40 ml/hr, Pro-Stat 30 mL daily  Admit weight 47.4 kg; current wt 51.8 kg. Moderate edema on exam.  EDW 42.5 kg  Labs: CBGs 184-204, Creatinine 4.68, BUN 95 Meds: B-complex with C, lactulose  NUTRITION - FOCUSED PHYSICAL EXAM:    Most Recent Value  Orbital Region  Moderate depletion  Upper Arm Region  Moderate depletion  Thoracic and Lumbar Region  Severe depletion  Buccal Region  Severe depletion  Temple Region  Moderate depletion  Clavicle Bone Region  Severe depletion  Clavicle and Acromion Bone Region  Moderate depletion  Scapular Bone Region  Severe depletion  Dorsal Hand  Unable to assess  Patellar Region  Severe depletion  Anterior  Thigh Region  Severe depletion  Posterior Calf Region  Severe depletion  Edema (RD Assessment)  Moderate       Diet Order:   Diet Order            Diet NPO time specified  Diet effective now              EDUCATION NEEDS:   Not appropriate for education at this time  Skin:  Skin Assessment: Skin Integrity Issues: Skin Integrity Issues:: Stage I Stage I: buttock  Last BM:  12/3 rectal tube  Height:   Ht Readings from Last 1 Encounters:  12-25-18 5\' 4"  (1.626 m)    Weight:   Wt Readings from Last 1 Encounters:  12/25/18 51.8 kg     BMI:  Body mass index is 19.6 kg/m.  Estimated Nutritional Needs:   Kcal:  1582 kcals  Protein:  71-94 g  Fluid:  1000 mL plus UOP    [employer] MS, RDN, LDN, CNSC [phone] Pager  [phone] Weekend/On-Call Pager

## 2019-01-05 NOTE — Progress Notes (Signed)
EKG tech hand delivered EKG to me after the machine alerted her of a critical result due to long QTc. Previous EKGs also displayed similar results. Elink notified, will continue to monitor.

## 2019-01-05 NOTE — Progress Notes (Signed)
Pharmacy Antibiotic Note  Erin Morgan is a 64 y.o. female admitted on 11/26/2018 with little response per neurology and with concern for possible CNS infection given immunosuppression.  Pharmacy has been consulted for vancomycin, ceftriaxone, amp dosing.  ESRD with last HD yesterday however not full session.    Plan: Vancomycin 1g IV once, then f/u HD schedule for further 500mg  dosing vs. CRRT plans Ceftriaxone 2g Iv every 12 hours Ampicillin 2g Iv every 24 hours Monitor HD plans (switch to CRRT), pt response for LOT, vanc levels as needed  Height: 5\' 4"  (162.6 cm) Weight: 114 lb 3.2 oz (51.8 kg) IBW/kg (Calculated) : 54.7  Temp (24hrs), Avg:98.8 F (37.1 C), Min:97.6 F (36.4 C), Max:100.3 F (37.9 C)  Recent Labs  Lab 12/01/18 0800  12/01/18 1615  12/03/18 0630 12/04/18 0420 12/04/18 0833 12/05/18 0439 12/06/18 0300 12/06/18 2328 12-20-2018 0312  WBC  --   --   --    < > 8.7 7.6  --  6.2 4.1  --  5.1  CREATININE  --    < > 1.90*  --  3.59*  --  2.50*  --  5.14* 4.60* 4.68*  LATICACIDVEN >11.0*  --  4.0*  --   --   --   --   --   --   --   --    < > = values in this interval not displayed.    Estimated Creatinine Clearance: 9.9 mL/min (A) (by C-G formula based on SCr of 4.68 mg/dL (H)).    No Known Allergies  Bertis Ruddy, PharmD Clinical Pharmacist Please check AMION for all [facility] numbers 2018-12-20 8:26 AM

## 2019-01-05 NOTE — Progress Notes (Signed)
RT NOTE: Patient biting on ETT. Bite block placed per Dr Ruthann Cancer.

## 2019-01-05 NOTE — Progress Notes (Signed)
Cardiology paged due to pt HR sustaining > 140s, maxed on cardizem and PRN  IV metoprolol not effective. One dose metoprolol per tube ordered and given. Will continue to monitor.   Gabriel Rainwater, RN

## 2019-01-05 DEATH — deceased

## 2020-01-23 IMAGING — MR MR HEAD W/O CM
8 of 10 series · 36 of 48 positions shown · non-contrast
Comparison: MRI head 11/30/2018

CLINICAL DATA: Cardiac arrest with anoxic injury. Prolonged altered
mental status.

EXAM:
MRI HEAD WITHOUT CONTRAST
TECHNIQUE: Multiplanar, multiecho pulse sequences of the brain and surrounding
structures were obtained without intravenous contrast.

[Series 3: DWI · axial · 3.0mm · 1.09mm/px · z∈[+39,+179]mm · 9 of 96 slices shown (1 of 4)]
[im 1/96]
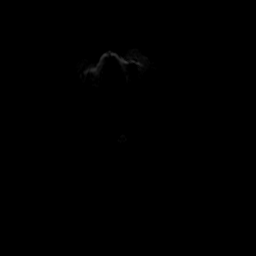
[im 12/96]
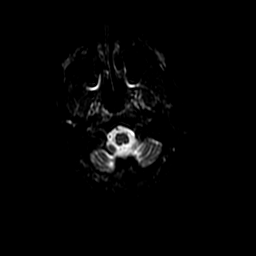
[im 24/96]
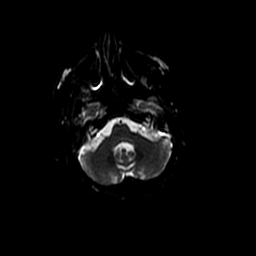
[im 36/96]
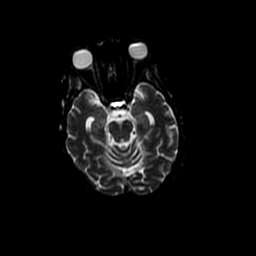
[im 48/96]
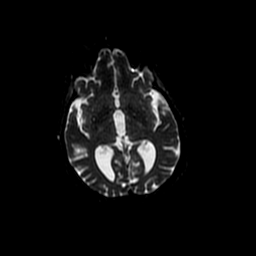
[im 60/96]
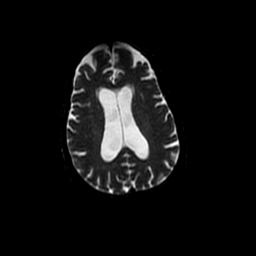
[im 72/96]
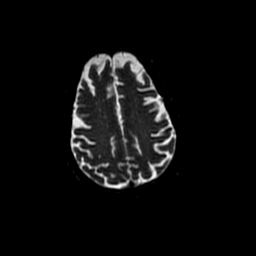
[im 84/96]
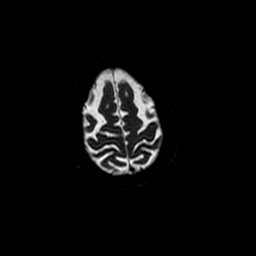
[im 96/96]
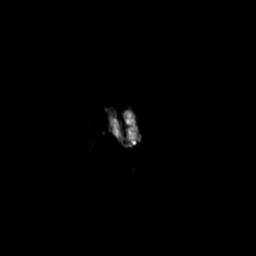

[Series 4: DWI · coronal · 5.0mm · 1.09mm/px · 7 of 70 slices shown (2 of 4)]
[im 1/70]
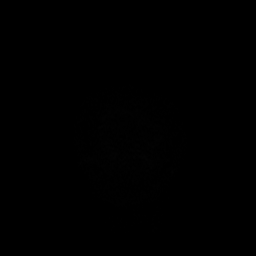
[im 12/70]
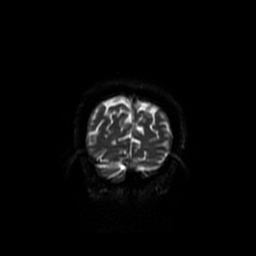
[im 24/70]
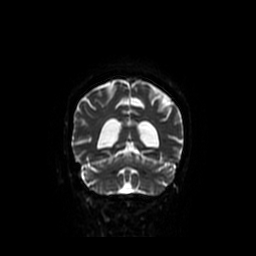
[im 35/70]
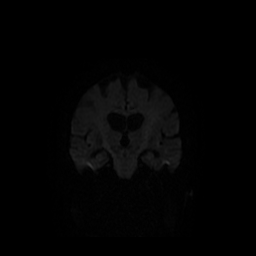
[im 47/70]
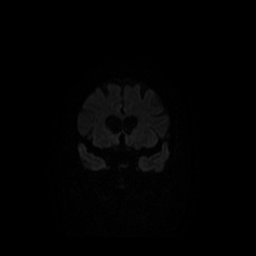
[im 58/70]
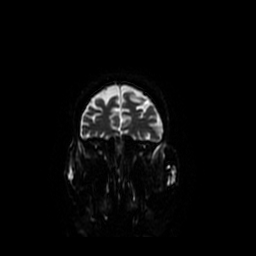
[im 70/70]
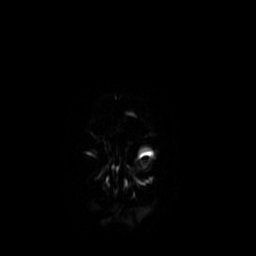

[Series 5: T1 · sagittal · 5.0mm · 0.47mm/px · 2 of 23 slices shown]
[im 1/23]
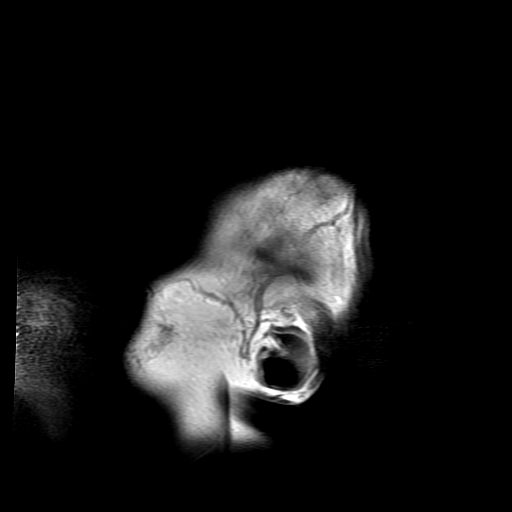
[im 23/23]
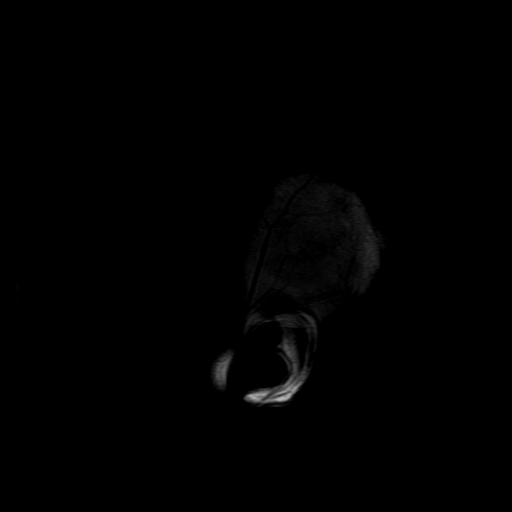

[Series 6: T2 · axial · 5.0mm · 0.43mm/px · z∈[+35,+190]mm · 3 of 27 slices shown]
[im 1/27]
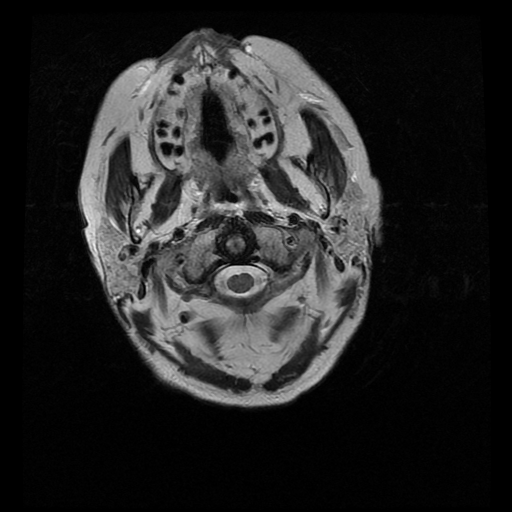
[im 14/27]
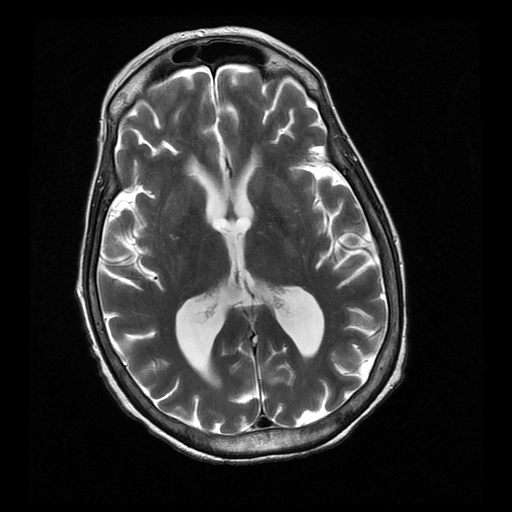
[im 27/27]
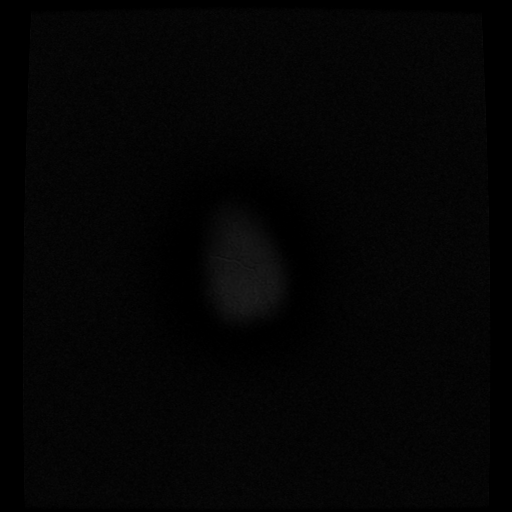

[Series 7: FLAIR · axial · 3.0mm · 0.43mm/px · z∈[+39,+183]mm · 3 of 25 slices shown]
[im 1/25]
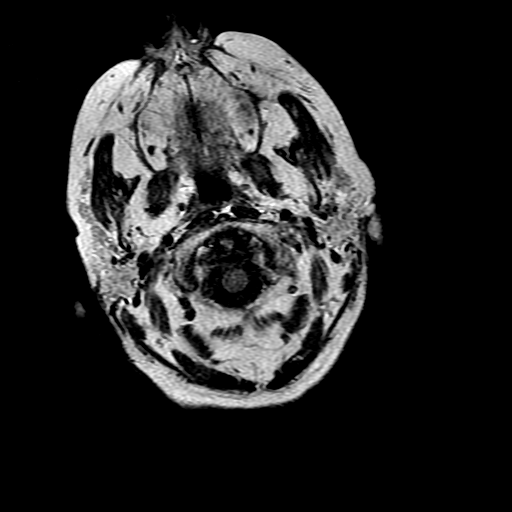
[im 13/25]
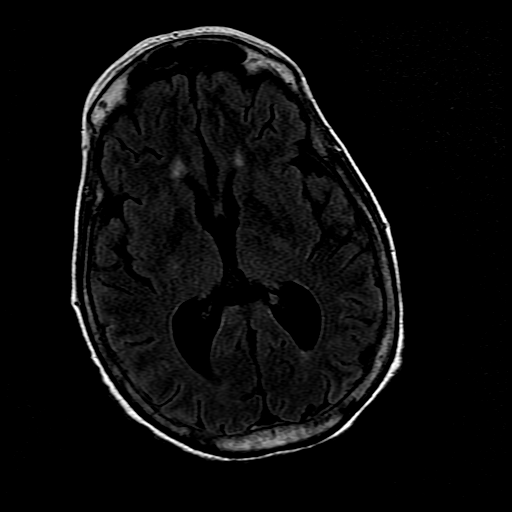
[im 25/25]
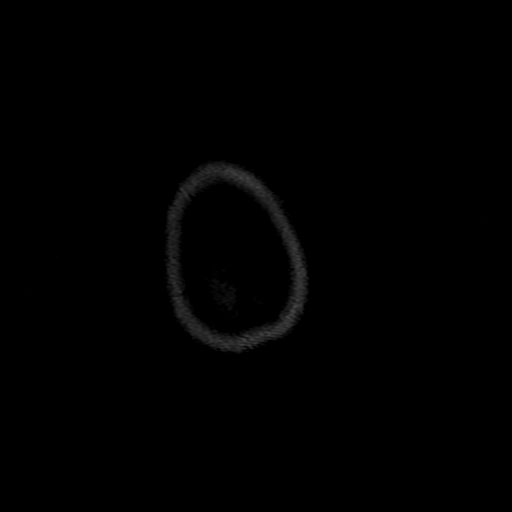

[Series 10: T2 post-contrast · coronal · 5.0mm · 0.39mm/px · 3 of 26 slices shown]
[im 1/26]
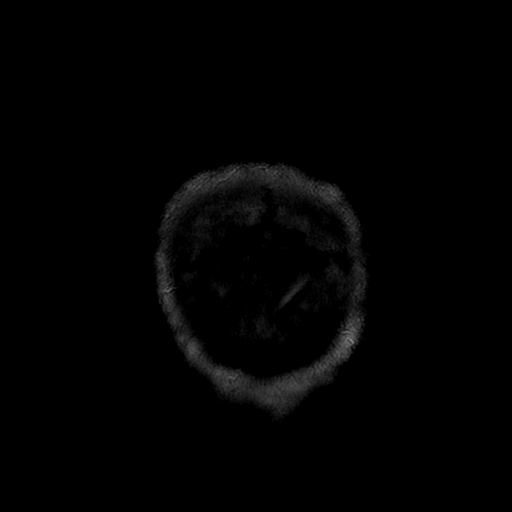
[im 13/26]
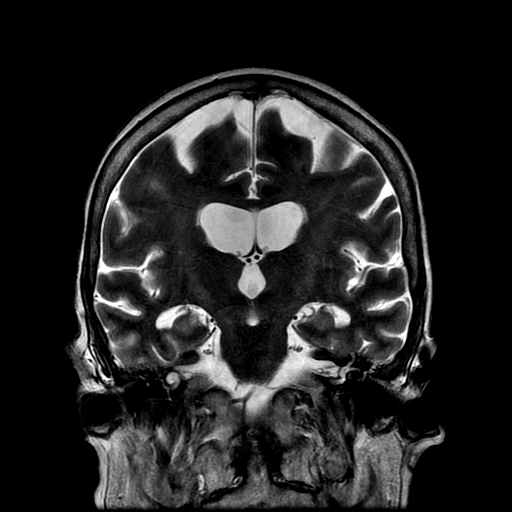
[im 26/26]
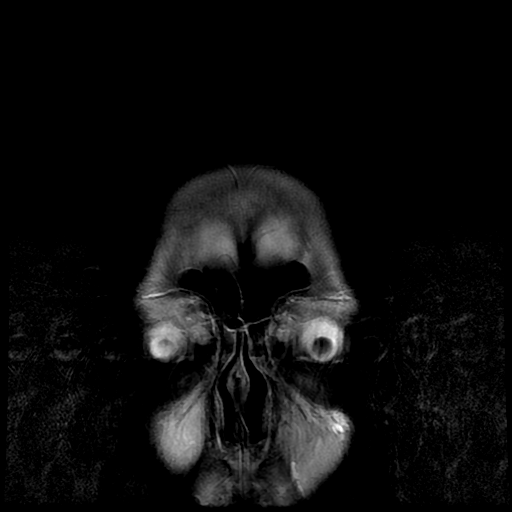

[Series 300: DWI · axial · 3.0mm · 1.09mm/px · z∈[+39,+179]mm · 5 of 48 slices shown (3 of 4)]
[im 1/48]
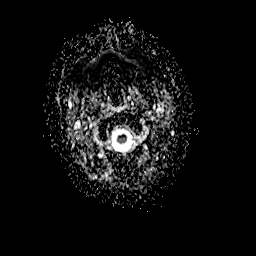
[im 12/48]
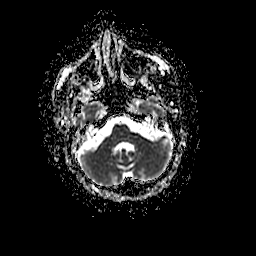
[im 24/48]
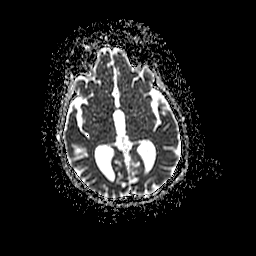
[im 36/48]
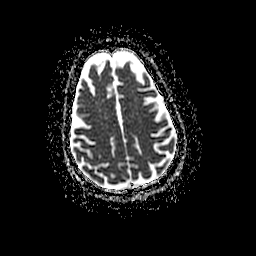
[im 48/48]
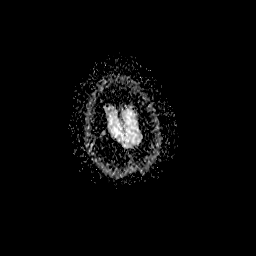

[Series 400: DWI · coronal · 5.0mm · 1.09mm/px · 4 of 35 slices shown (4 of 4)]
[im 1/35]
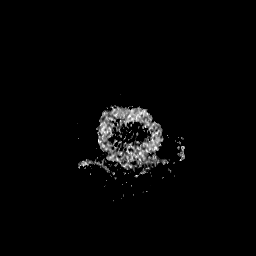
[im 12/35]
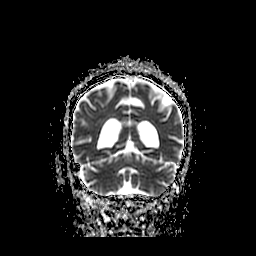
[im 23/35]
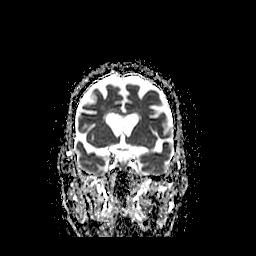
[im 35/35]
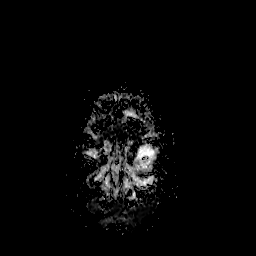

[36 of 48 positions shown; findings below may reference images not displayed]

FINDINGS: Brain: Ventricular enlargement and atrophy, unchanged from the prior
study. Negative for acute infarct. Minimal chronic white matter
changes stable. Negative for hemorrhage or mass. No edema or midline
shift.

Vascular: Normal arterial flow voids.

Skull and upper cervical spine: No focal bony abnormality

Sinuses/Orbits: Air-fluid levels in the maxillary sinus bilaterally
are new since the prior study. Mild mucosal edema elsewhere in the
paranasal sinuses. Bilateral mastoid effusions are new. No orbital
lesion.

Other: None
IMPRESSION: No acute abnormality.  Atrophy and mild chronic white matter changes

Interval development of air-fluid levels in both maxillary sinuses.
Interval development of bilateral mastoid effusion and mucosal edema
throughout the paranasal sinuses.

## 2020-01-25 IMAGING — RF DG SPINAL PUNCT LUMBAR DIAG WITH FL CT GUIDANCE
1 series · 1 of 1 positions shown · non-contrast
Comparison: none

CLINICAL DATA: Altered mental status, encephalopathy

[Series 1: cp_standard · 0.18mm/px · 1 of 1 slices shown]
[im 1/1]
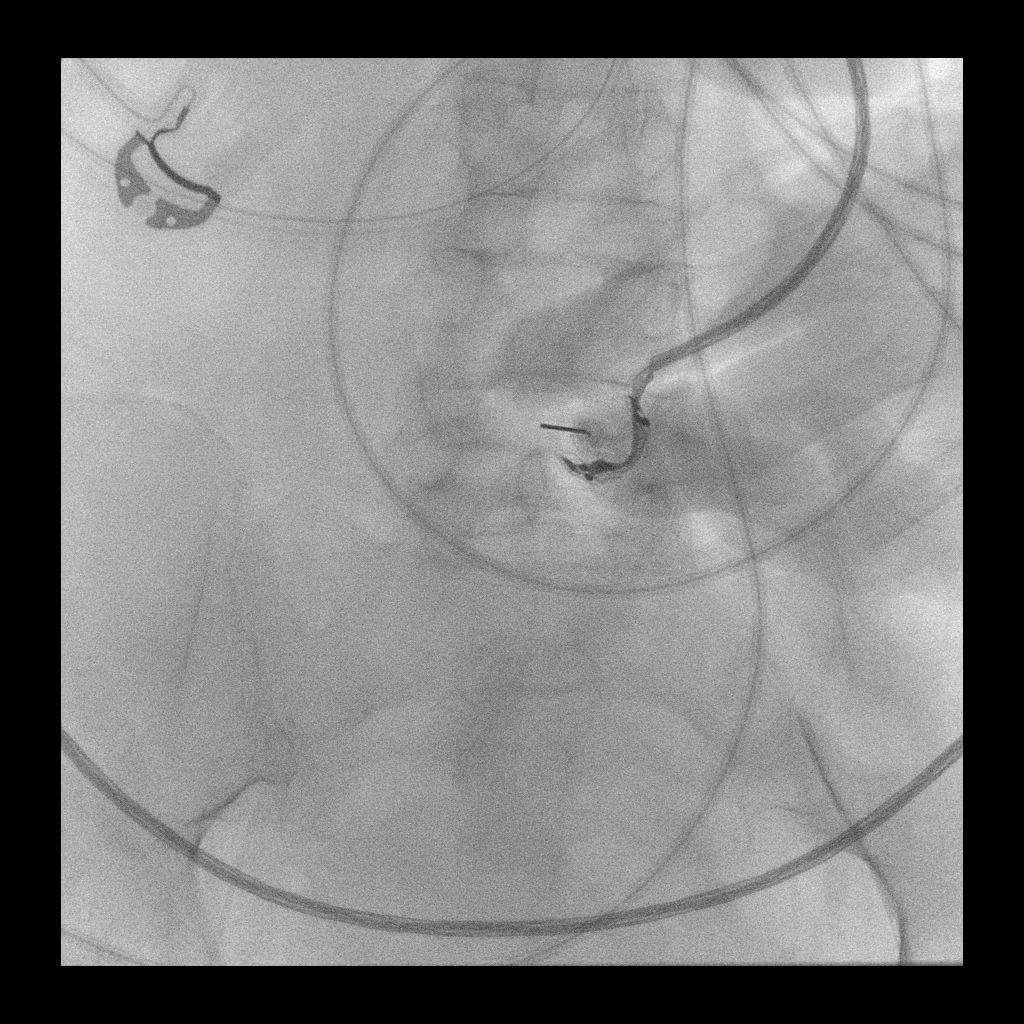

[1 of 1 positions shown; findings below may reference images not displayed]

EXAM:
DIAGNOSTIC LUMBAR PUNCTURE UNDER FLUOROSCOPIC GUIDANCE

FLUOROSCOPY TIME:  Fluoroscopy Time:  0 minutes, 12 seconds

Radiation Exposure Index (if provided by the fluoroscopic device):
1.4 mGy

PROCEDURE:
I discussed the risks (including hemorrhage, infection, and nerve
damage, among others), benefits, and alternatives to
fluoroscopically guided lumbar puncture with patient's mother by
telephone, as the patient is unable to consent and has altered
mental status. We specifically discussed the high likelihood of
technical success of the procedure. The patient's mother understood
and elected for the patient undergo the procedure.

Standard time-out was employed. Following sterile skin prep and
local anesthetic administration consisting of 1 percent lidocaine, a
20 gauge spinal needle was advanced without difficulty into the
thecal sac at the L4-5 level under fluoroscopic guidance. Clear CSF
was returned. Because the patient is intubated, I did not turn her
onto her side to obtain a pressure measurement. A pressure
measurement obtained with patient prone demonstrated CSF pressure of
23 cm of water.

A total of 13 cc of clear CSF was collected in 4 vials. The needle
was subsequently removed and the skin cleansed and bandaged. No
immediate complications were observed.
IMPRESSION: 1. Technically successful lumbar puncture at the L4-5 level. Prone
opening pressure was 23 cm of water. 13 cc of clear CSF was sent to
the lab for analysis.
